# Patient Record
Sex: Female | Born: 1975 | Hispanic: No | Marital: Married | Smoking: Never smoker
Health system: Southern US, Community
[De-identification: ages and names within clinical notes are randomized; demographics above are authoritative.]

## PROBLEM LIST (undated history)

## (undated) DIAGNOSIS — F419 Anxiety disorder, unspecified: Secondary | ICD-10-CM

## (undated) DIAGNOSIS — K219 Gastro-esophageal reflux disease without esophagitis: Secondary | ICD-10-CM

## (undated) DIAGNOSIS — C50919 Malignant neoplasm of unspecified site of unspecified female breast: Secondary | ICD-10-CM

## (undated) DIAGNOSIS — C801 Malignant (primary) neoplasm, unspecified: Secondary | ICD-10-CM

## (undated) DIAGNOSIS — IMO0001 Reserved for inherently not codable concepts without codable children: Secondary | ICD-10-CM

## (undated) DIAGNOSIS — IMO0002 Reserved for concepts with insufficient information to code with codable children: Secondary | ICD-10-CM

## (undated) DIAGNOSIS — M62830 Muscle spasm of back: Secondary | ICD-10-CM

## (undated) HISTORY — DX: Reserved for inherently not codable concepts without codable children: IMO0001

## (undated) HISTORY — DX: Reserved for concepts with insufficient information to code with codable children: IMO0002

## (undated) HISTORY — PX: LEEP: SHX91

## (undated) HISTORY — PX: OTHER SURGICAL HISTORY: SHX169

## (undated) HISTORY — DX: Malignant neoplasm of unspecified site of unspecified female breast: C50.919

## (undated) HISTORY — PX: TONSILLECTOMY: SUR1361

## (undated) HISTORY — PX: WISDOM TOOTH EXTRACTION: SHX21

## (undated) HISTORY — PX: BILATERAL TOTAL MASTECTOMY WITH AXILLARY LYMPH NODE DISSECTION: SHX6364

## (undated) HISTORY — PX: BREAST SURGERY: SHX581

---

## 2013-03-28 ENCOUNTER — Other Ambulatory Visit (HOSPITAL_COMMUNITY): Payer: Self-pay | Admitting: *Deleted

## 2013-03-28 DIAGNOSIS — Z1231 Encounter for screening mammogram for malignant neoplasm of breast: Secondary | ICD-10-CM

## 2013-04-14 ENCOUNTER — Ambulatory Visit (HOSPITAL_COMMUNITY): Payer: 59

## 2013-04-15 ENCOUNTER — Ambulatory Visit (HOSPITAL_COMMUNITY): Payer: 59

## 2013-04-15 ENCOUNTER — Ambulatory Visit (HOSPITAL_COMMUNITY)
Admission: RE | Admit: 2013-04-15 | Discharge: 2013-04-15 | Disposition: A | Discharge status: Home / Self Care | Payer: 59 | Source: Ambulatory Visit | Attending: *Deleted | Admitting: *Deleted

## 2013-04-15 DIAGNOSIS — Z1231 Encounter for screening mammogram for malignant neoplasm of breast: Secondary | ICD-10-CM

## 2013-04-18 ENCOUNTER — Other Ambulatory Visit: Payer: Self-pay | Admitting: Obstetrics and Gynecology

## 2013-04-18 DIAGNOSIS — C50919 Malignant neoplasm of unspecified site of unspecified female breast: Secondary | ICD-10-CM

## 2013-04-29 ENCOUNTER — Ambulatory Visit
Admission: RE | Admit: 2013-04-29 | Discharge: 2013-04-29 | Disposition: A | Discharge status: Home / Self Care | Payer: 59 | Source: Ambulatory Visit | Attending: Obstetrics and Gynecology | Admitting: Obstetrics and Gynecology

## 2013-04-29 DIAGNOSIS — C50919 Malignant neoplasm of unspecified site of unspecified female breast: Secondary | ICD-10-CM

## 2013-04-29 MED ORDER — GADOBENATE DIMEGLUMINE 529 MG/ML IV SOLN
19.0000 mL | Freq: Once | INTRAVENOUS | Status: AC | PRN
  Administered 2013-04-29: 19 mL via INTRAVENOUS

## 2013-05-03 ENCOUNTER — Other Ambulatory Visit: Payer: Self-pay | Admitting: Obstetrics and Gynecology

## 2013-05-03 ENCOUNTER — Other Ambulatory Visit: Payer: Self-pay | Admitting: Internal Medicine

## 2013-05-03 DIAGNOSIS — R928 Other abnormal and inconclusive findings on diagnostic imaging of breast: Secondary | ICD-10-CM

## 2013-05-04 ENCOUNTER — Other Ambulatory Visit (HOSPITAL_COMMUNITY): Payer: Self-pay | Admitting: Obstetrics and Gynecology

## 2013-05-04 DIAGNOSIS — R2232 Localized swelling, mass and lump, left upper limb: Secondary | ICD-10-CM

## 2013-05-04 DIAGNOSIS — Z853 Personal history of malignant neoplasm of breast: Secondary | ICD-10-CM

## 2013-05-05 ENCOUNTER — Ambulatory Visit
Admission: RE | Admit: 2013-05-05 | Discharge: 2013-05-05 | Disposition: A | Discharge status: Home / Self Care | Payer: 59 | Source: Ambulatory Visit | Attending: Obstetrics and Gynecology | Admitting: Obstetrics and Gynecology

## 2013-05-05 DIAGNOSIS — R928 Other abnormal and inconclusive findings on diagnostic imaging of breast: Secondary | ICD-10-CM

## 2013-05-06 ENCOUNTER — Other Ambulatory Visit: Payer: 59

## 2013-05-10 ENCOUNTER — Telehealth: Payer: Self-pay | Admitting: *Deleted

## 2013-05-10 NOTE — Telephone Encounter (Signed)
Confirmed [facility] for 05/18/13 at 0800 .  Instructions and contact information given.

## 2013-05-11 ENCOUNTER — Other Ambulatory Visit: Payer: Self-pay | Admitting: *Deleted

## 2013-05-11 DIAGNOSIS — C771 Secondary and unspecified malignant neoplasm of intrathoracic lymph nodes: Secondary | ICD-10-CM | POA: Insufficient documentation

## 2013-05-11 DIAGNOSIS — C50912 Malignant neoplasm of unspecified site of left female breast: Secondary | ICD-10-CM | POA: Insufficient documentation

## 2013-05-11 DIAGNOSIS — C50919 Malignant neoplasm of unspecified site of unspecified female breast: Secondary | ICD-10-CM | POA: Insufficient documentation

## 2013-05-11 DIAGNOSIS — C773 Secondary and unspecified malignant neoplasm of axilla and upper limb lymph nodes: Principal | ICD-10-CM

## 2013-05-18 ENCOUNTER — Ambulatory Visit (HOSPITAL_BASED_OUTPATIENT_CLINIC_OR_DEPARTMENT_OTHER): Payer: 59 | Admitting: Oncology

## 2013-05-18 ENCOUNTER — Ambulatory Visit: Payer: 59 | Attending: Surgery | Admitting: Physical Therapy

## 2013-05-18 ENCOUNTER — Encounter: Payer: Self-pay | Admitting: *Deleted

## 2013-05-18 ENCOUNTER — Telehealth: Payer: Self-pay | Admitting: Oncology

## 2013-05-18 ENCOUNTER — Encounter: Payer: Self-pay | Admitting: Oncology

## 2013-05-18 ENCOUNTER — Other Ambulatory Visit (HOSPITAL_BASED_OUTPATIENT_CLINIC_OR_DEPARTMENT_OTHER): Payer: 59

## 2013-05-18 ENCOUNTER — Ambulatory Visit
Admission: RE | Admit: 2013-05-18 | Discharge: 2013-05-18 | Disposition: A | Discharge status: Home / Self Care | Payer: 59 | Source: Ambulatory Visit | Attending: Radiation Oncology | Admitting: Radiation Oncology

## 2013-05-18 ENCOUNTER — Ambulatory Visit: Payer: 59

## 2013-05-18 ENCOUNTER — Ambulatory Visit (HOSPITAL_BASED_OUTPATIENT_CLINIC_OR_DEPARTMENT_OTHER): Payer: 59 | Admitting: Surgery

## 2013-05-18 VITALS — BP 123/83 | HR 91 | Temp 98.3°F | Resp 20 | Ht 66.5 in | Wt 202.1 lb

## 2013-05-18 DIAGNOSIS — C773 Secondary and unspecified malignant neoplasm of axilla and upper limb lymph nodes: Secondary | ICD-10-CM

## 2013-05-18 DIAGNOSIS — Z170 Estrogen receptor positive status [ER+]: Secondary | ICD-10-CM

## 2013-05-18 DIAGNOSIS — C50919 Malignant neoplasm of unspecified site of unspecified female breast: Secondary | ICD-10-CM

## 2013-05-18 DIAGNOSIS — Z853 Personal history of malignant neoplasm of breast: Secondary | ICD-10-CM | POA: Insufficient documentation

## 2013-05-18 DIAGNOSIS — M259 Joint disorder, unspecified: Secondary | ICD-10-CM | POA: Insufficient documentation

## 2013-05-18 DIAGNOSIS — IMO0001 Reserved for inherently not codable concepts without codable children: Secondary | ICD-10-CM | POA: Insufficient documentation

## 2013-05-18 LAB — COMPREHENSIVE METABOLIC PANEL (CC13)
ALBUMIN: 3.6 g/dL (ref 3.5–5.0)
ALT: 11 U/L (ref 0–55)
AST: 14 U/L (ref 5–34)
Alkaline Phosphatase: 77 U/L (ref 40–150)
Anion Gap: 9 mEq/L (ref 3–11)
BUN: 9.7 mg/dL (ref 7.0–26.0)
CO2: 23 mEq/L (ref 22–29)
Calcium: 10 mg/dL (ref 8.4–10.4)
Chloride: 108 mEq/L (ref 98–109)
Creatinine: 0.8 mg/dL (ref 0.6–1.1)
Glucose: 103 mg/dl (ref 70–140)
POTASSIUM: 4 meq/L (ref 3.5–5.1)
Sodium: 140 mEq/L (ref 136–145)
TOTAL PROTEIN: 7.6 g/dL (ref 6.4–8.3)

## 2013-05-18 LAB — CBC WITH DIFFERENTIAL/PLATELET
BASO%: 0.7 % (ref 0.0–2.0)
Basophils Absolute: 0 10*3/uL (ref 0.0–0.1)
EOS%: 6 % (ref 0.0–7.0)
Eosinophils Absolute: 0.4 10*3/uL (ref 0.0–0.5)
HCT: 36.9 % (ref 34.8–46.6)
HGB: 11.9 g/dL (ref 11.6–15.9)
LYMPH%: 43.6 % (ref 14.0–49.7)
MCH: 27.6 pg (ref 25.1–34.0)
MCHC: 32.2 g/dL (ref 31.5–36.0)
MCV: 85.6 fL (ref 79.5–101.0)
MONO#: 0.5 10*3/uL (ref 0.1–0.9)
MONO%: 7.9 % (ref 0.0–14.0)
NEUT#: 2.5 10*3/uL (ref 1.5–6.5)
NEUT%: 41.8 % (ref 38.4–76.8)
Platelets: 290 10*3/uL (ref 145–400)
RBC: 4.31 10*6/uL (ref 3.70–5.45)
RDW: 14.3 % (ref 11.2–14.5)
WBC: 6 10*3/uL (ref 3.9–10.3)
lymph#: 2.6 10*3/uL (ref 0.9–3.3)

## 2013-05-18 NOTE — Progress Notes (Signed)
Pt signed a ROI for the [facility].  She also signed a ROI for her previous Psychologist, sport and exercise, oncologist and plastics and I faxed it to each facility.  Pt signed the [facility] form and I faxed it to [place] at [employer] and filed it.

## 2013-05-18 NOTE — Progress Notes (Addendum)
Re:   [name] DOB:   1976/01/12 MRN:   [id number]  [place]  ASSESSMENT AND PLAN: 1.  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  Oncologist - Wentworth/Magrinat  Plan: 1) PET scan, 2) if PET negative, do completion left axillary dissection, if PET postive, leave axillary nodes, 3) Power port (she did not have one the first time for treatment), 4) refer for gentics  [MRI - 1/302015 - 1. Examination is positive for hypermetabolic left axillary and left supraclavicular lymph node metastasis. 2. No evidence for distant metastatic disease.   Discussed with patient, Drs. Magrinat and Wentwoth - plan: first surgery - completion left axillary dissection and power port placement, secondly chemotherapy, and thirdly radiation therapy.  I discussed risks of surgery including bleeding, infection, nerve injury, lymphedema, and pneumothorax. DN 05/31/2013]  2.  History of bilateral mastectomy for breast cancer  No chief complaint on file.  REFERRING PHYSICIAN: POWELL,ELMIRA, PA-C  HISTORY OF PRESENT ILLNESS: [name] is a 38 y.o. (DOB: 09/06/1975)  AA  female whose primary care physician is POWELL,ELMIRA, PA-C and comes to [facility]. Comes with boyfriend, Susa Raring.  [name] underwent bilateral mastectomy, chemothx with CMF and 15 months of tamoxifen for a left breast cancer identified in October of 2011 in [place].  But she did not tolerate the tamoxifen and stopped it herself.  She was treated by [facility] in [facility], Dr. Annette Stable.  She had a bilateral mastectomy 01/28/2010.   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 2/6 left axillary nodes showed isolated tumor cells, but the others were negative.  The cancer was ER/PR positive and Her2Neu negative.  She was  treated with CMF starting 02/22/2010 - 07/05/2010.  Tissue expander 'replaced" by Dr. Westley Chandler on 09/11/2010 after right side got infected Oncotype DX score of 22 with recurrence rate of 14%.  Recent evaluation showed a left axillary mass.  She underwent a core biopsy of the left axilla on 05/05/2013 (Accession#: [id number]) which showed metastatic mammary carcinoma.  MRI - 05/02/2013 - Left axillary lymphadenopathy. Second-look ultrasound of the left  axilla is recommended for further evaluation and possible biopsy targeting if needed.  No evidence for malignancy in the right mastectomy bed.  She has no children.  Her LMP was 05/02/2013.  She is on no hormone therapy. She underwent BRAC testing which she says was negative.   No past medical history on file.   No past surgical history on file.    No current outpatient prescriptions on file.   No current facility-administered medications for this visit.      Allergies  Allergen Reactions  . Gadolinium Derivatives Hives    After gado injection, pt had a few small hives on left breast area. Treated with benadryl by dr Janeece Fitting     REVIEW OF SYSTEMS: Skin:  No history of rash.  No history of abnormal moles. Infection:  She did have an infection post mastectomy/tissue expander which necessitated removing the tissue expander.. Neurologic:  No history of stroke.  No history of seizure.  No history of headaches. Cardiac:  No history of hypertension. No history of heart disease.   No history of seeing a cardiologist. Pulmonary:  Does not smoke cigarettes.  No asthma or bronchitis.  No OSA/CPAP.  Endocrine:  No diabetes. No thyroid  disease. Gastrointestinal:  No history of stomach disease.  No history of liver disease.  No history of gall bladder disease.  No history of pancreas disease.  No history of colon disease. Urologic:  No history of kidney stones.  No history of bladder infections. Musculoskeletal:  No history of joint or back  disease. Hematologic:  No bleeding disorder.  No history of anemia.  Not anticoagulated. Psycho-social:  The patient is oriented.     SOCIAL and FAMILY HISTORY: Unmarried. Boyfriend, Susa Raring, with patient. She moved to [facility] in June 2013 to be near boyfriend. No children. Works for [employer], Corporate treasurer  PHYSICAL EXAM: There were no vitals taken for this visit.  General: AA F who is alert and generally healthy appearing.  HEENT: Normal. Pupils equal. Neck: Supple. No mass.  No thyroid mass. Lymph Nodes:  No supraclavicular or cervical nodes.  Left axillary scar, but I do not feel an obvious mass. Lungs: Clear to auscultation and symmetric breath sounds. Heart:  RRR. No murmur or rub. Breast:  Right - reconstructed, no mass  Left - reconstructed, no mass  Abdomen: Soft. No mass. No tenderness. No hernia. Normal bowel sounds.  No abdominal scars. Rectal: Not done. Extremities:  Good strength and ROM  in upper and lower extremities. Neurologic:  Grossly intact to motor and sensory function. Psychiatric: Has normal mood and affect. Behavior is normal.   DATA REVIEWED: Epic notes  Alphonsa Overall, MD,  [facility] [address]  [place], [place]    [place] Phone:  [place]:  [phone]

## 2013-05-18 NOTE — Progress Notes (Signed)
[name]  [phone] [phone]) [phone]     ID: [name] OB: 07-04-1975  MR#: [id number]  JYN#:[id number]  PCP: Colvin Caroli GYN:   SU: Alphonsa Overall OTHER MD: Lonia Chimera [phone])  CHIEF COMPLAINT: " I have breast cancer"  HISTORY OF PRESENT ILLNESS: [name] had bilateral diagnostic mammography [facility] in [place] 11/30/2009 showing a 1.5 cm mass at the 9:00 position of the left breast with some satellites. A biopsy of the mass in question [place] [facility] showed ([id number]) and invasive lobular carcinoma, which was estrogen and progesterone receptor both 100% positive, both with strong staining intensity, but HER-2 negative at 1+. Bilateral breast MRI 12/13/2009 in [place] showed in the left breast an irregularly marginated mass measuring 3.2 cm. There were 4 or 5 separate satellite lesions laterally and superior to the primary.  Accordingly, after appropriate discussion, the patient underwent bilateral mastectomies with left sentinel lymph node sampling 01/02/2010. The right breast was benign. The left breast showed a 2.3 cm invasive lobular carcinoma, grade 3, with a total of 6 sentinel lymph nodes removed, all negative, although 2 showed isolated tumor cells by immunostaining. Margins were negative.  An Oncotype BX was sent, with a recurrence score of 22, predicting a risk of distant recurrence within 10 years with 14% of the patients only systemic treatment was tamoxifen. The patient was treated with CMF chemotherapy between 02/22/2010 and 07/05/2010, receiving 7 cycles at which point the patient refused further chemotherapy though there have been no major toxicities at according to the oncology note from Dr. Brent General. The patient was then started on tamoxifen 08/09/2010. By her cancer took approximately 12-15 months then stopped because of hot flashes and insomnia problems.  More  recently, the patient noted a change in her left axilla andunderwent bilateral breast MRI January to 2015 at [place]. The patient has bilateral submuscular silicone implants in place. The breast were unremarkable, but there were several lymph nodes with cortical thickening in the left axilla including a dominant 1 measuring 1.2 cm in the short axis. Ultrasonography of this area identified the lymph node in question and the patient underwent biopsy of the left axillary lymph node 05/05/2013. This showed ([id number]) near-total replacement of the lymph node by metastatic carcinoma. This was 96% estrogen receptor positive, and 84% progesterone receptor positive, both with strong staining intensity. The MIB-1 was 20%. HER-2 determination is pending.  The patient's subsequent history is as detailed below  INTERVAL HISTORY: [name] was seen at the multidisciplinary breast cancer clinic 05/18/2013 accompanied by her significant other [name].  REVIEW OF SYSTEMS: The patient denies unusual headaches, visual changes, nausea, vomiting, stiff neck, dizziness, or gait imbalance. There has been no cough, phlegm production, or pleurisy, no chest pain or pressure, and no change in bowel or bladder habits. The patient denies fever, rash, bleeding, unexplained fatigue or unexplained weight loss. A detailed review of systems was otherwise entirely negative.                 PAST MEDICAL HISTORY: No past medical history on file.  PAST SURGICAL HISTORY: Past Surgical History  Procedure Laterality Date  . Bilateral total mastectomy with axillary lymph node dissection    . Tonsillectomy    . Leep      FAMILY HISTORY Family History  Problem Relation Age of Onset  . Other Mother     glioblastoma  . Other Father     glioblastoma  according to the patient both her parents died from a primary brain cancers, namely we have the stomas, her father at 42, her mother at 25. The patient had one  brother and one sister. There is no history of breast or ovarian cancer in the family  GYNECOLOGIC HISTORY:  Menarche age 71. The patient was never carried a child to term. She is still having regular periods, most recently second week in January. The patient took oral contraceptives between the ages of 59 and 50 with no complications  SOCIAL HISTORY:  [name] works as a Engineer, civil (consulting) for [employer]. She shares a home with her significant other, [name], who works at [employer]. They have no pets.    ADVANCED DIRECTIVES: Not in place   HEALTH MAINTENANCE: History  Substance Use Topics  . Smoking status: Never Smoker   . Smokeless tobacco: Not on file  . Alcohol Use: Yes     Comment: once a week     Colonoscopy:  PAP: December 2014  Bone density:  Lipid panel:  Allergies  Allergen Reactions  . Gadolinium Derivatives Hives    After gado injection, pt had a few small hives on left breast area. Treated with benadryl by dr Janeece Fitting     No current outpatient prescriptions on file.   No current facility-administered medications for this visit.    OBJECTIVE: Young African American woman in no acute distress Filed Vitals:   05/18/13 0829  BP: 123/83  Pulse: 91  Temp: 98.3 F (36.8 C)  Resp: 20     Body mass index is 32.13 kg/(m^2).    ECOG FS:0 - Asymptomatic  Ocular: Sclerae unicteric, pupils equal, round and reactive to light Ear-nose-throat: Oropharynx clear, no thrush or other lesions Lymphatic: No cervical or supraclavicular adenopathy Lungs no rales or rhonchi, good excursion bilaterally Heart regular rate and rhythm, no murmur appreciated Abd soft, nontender, positive bowel sounds MSK no focal spinal tenderness, no joint edema Neuro: non-focal, well-oriented, appropriate affect Breasts: The patient is status post bilateral mastectomies with bilateral submuscular implant. There is no evidence of local recurrence. The right axilla is  benign. I do not feel a well-defined mass in the left axilla at this time.   LAB RESULTS:  CMP     Component Value Date/Time   NA 140 05/18/2013 0811   K 4.0 05/18/2013 0811   CO2 23 05/18/2013 0811   GLUCOSE 103 05/18/2013 0811   BUN 9.7 05/18/2013 0811   CREATININE 0.8 05/18/2013 0811   CALCIUM 10.0 05/18/2013 0811   PROT 7.6 05/18/2013 0811   ALBUMIN 3.6 05/18/2013 0811   AST 14 05/18/2013 0811   ALT 11 05/18/2013 0811   ALKPHOS 77 05/18/2013 0811   BILITOT <0.20 05/18/2013 0811    I No results found for this basename: SPEP, UPEP,  kappa and lambda light chains    Lab Results  Component Value Date   WBC 6.0 05/18/2013   NEUTROABS 2.5 05/18/2013   HGB 11.9 05/18/2013   HCT 36.9 05/18/2013   MCV 85.6 05/18/2013   PLT 290 05/18/2013      Chemistry      Component Value Date/Time   NA 140 05/18/2013 0811   K 4.0 05/18/2013 0811   CO2 23 05/18/2013 0811   BUN 9.7 05/18/2013 0811   CREATININE 0.8 05/18/2013 0811      Component Value Date/Time   CALCIUM 10.0 05/18/2013 0811   ALKPHOS 77 05/18/2013 0811   AST 14 05/18/2013 0811  ALT 11 05/18/2013 0811   BILITOT <0.20 05/18/2013 0811       No results found for this basename: LABCA2    No components found with this basename: [id number]    No results found for this basename: INR,  in the last 168 hours  Urinalysis No results found for this basename: colorurine, appearanceur, labspec, phurine, glucoseu, hgbur, bilirubinur, ketonesur, proteinur, urobilinogen, nitrite, leukocytesur    STUDIES: Mr Breast Bilateral W Wo Contrast  05/02/2013   CLINICAL DATA:  History of bilateral mastectomy for breast cancer 5 years ago at age 51. All prior imaging outside this state, not available for comparison. The provided clinical history does not specify whether the initial diagnosis of breast cancer was in the right or left breast.  EXAM: BILATERAL BREAST MRI WITH AND WITHOUT CONTRAST  LABS:  Not obtained  TECHNIQUE: Multiplanar, multisequence MR  images of both breasts were obtained prior to and following the intravenous administration of 40m of MultiHance  THREE-DIMENSIONAL MR IMAGE RENDERING ON INDEPENDENT WORKSTATION:  Three-dimensional MR images were rendered by post-processing of the original MR data on an independent workstation. The three-dimensional MR images were interpreted, and findings are reported in the following complete MRI report for this study. Three dimensional images were evaluated at the independent DynaCad workstation  COMPARISON:  No prior imaging.  FINDINGS: Breast composition: Not applicable, status post bilateral mastectomies with bilateral submuscular silicone implants in place.  Background parenchymal enhancement: None  Right breast: No mass or abnormal enhancement.  Left breast: No mass or abnormal enhancement.  Lymph nodes: There are several lymph nodes with cortical thickening in the left axilla, including a dominant 1.2 cm short axis lymph node on image 8 of the T2 weighted series.  Ancillary findings:  None.  IMPRESSION: Left axillary lymphadenopathy. Second-look ultrasound of the left axilla is recommended for further evaluation and possible biopsy targeting if needed.  No evidence for malignancy in the right mastectomy bed.  RECOMMENDATION: Left axilla ultrasound and possible ultrasound-guided core biopsy  BI-RADS CATEGORY  4: Suspicious abnormality - biopsy should be considered.   Electronically Signed   By: GConchita ParisM.D.   On: 05/02/2013 10:14   UKoreaLt Breast Bx W Loc Dev 1st Lesion Img Bx Spec UKoreaGuide  05/09/2013   ADDENDUM REPORT: 05/09/2013 14:27  ADDENDUM: Pathology results of a left axillary lymph node biopsy, performed under ultrasound guidance, show a lymph node replaced with metastatic carcinoma. The patient has a history of known breast cancer, diagnosed at age 38 and has undergone bilateral mastectomies.  Pathology results are concordant with imaging findings. The patient reports doing well after the  biopsy.  I telephoned pathology results to the patient today, 05/09/2013. [facility]   Electronically Signed   By: SCurlene DolphinM.D.   On: 05/09/2013 14:27   05/09/2013   CLINICAL DATA:  38year old female with prior history of breast cancer and left axillary lymphadenopathy seen on recent breast MRI.  EXAM: ULTRASOUND GUIDED LEFT BREAST CORE NEEDLE BIOPSY WITH VACUUM ASSIST  COMPARISON:  Previous exams.  PROCEDURE: I met with the patient and we discussed the procedure of ultrasound-guided biopsy, including benefits and alternatives. We discussed the high likelihood of a successful procedure. We discussed the risks of the procedure including infection, bleeding, tissue injury, clip migration, and inadequate sampling. The risk of implant rupture was also discussed with the patient. Informed written consent was given. The usual time-out protocol  was performed immediately prior to the procedure.  Using sterile technique and 2% Lidocaine as local anesthetic, under direct ultrasound visualization, a 12 gauge vacuum-assisteddevice was used to perform biopsy of the lymph node with a thickened cortex in the left axilla using a lateral approach.  IMPRESSION: Ultrasound-guided biopsy of the morphologically abnormal lymph node in the left axilla. No apparent complications.  Electronically Signed: By: Everlean Alstrom M.D. On: 05/05/2013 10:29    ASSESSMENT: 38 y.o. [place] woman  (1) status post bilateral mastectomies with left axillary lymph node sampling 01/02/2010 for a pT2 pN0(i+), stage IIA invasive lobular breast cancer, grade 3, estrogen and progesterone receptor positive, HER-2 not amplified; right breast was benign  (2) Oncotype DX score of 22 predicted a 14% risk of distant recurrence within 10 years if the patient's only systemic treatment is tamoxifen for 5 years  (3) status post CMF(cyclophosphamide, fluorouracil,  methotrexate) x7 given between October of 2011 and March of 2012 (7 of 8 planned treatments completed)  (4) tamoxifen started April 2012, discontinued approximately July of 2013 (about 15 months) because of problems with hot flashes and insomnia  (5) pathologically documented left axillary recurrence 05/05/2013, the tumor being again estrogen and progesterone receptor positive, with HER-2 in process, and MIB-1 of 15%    PLAN: We spent the better part of today's hour-long appointment discussing the biology of breast cancer in general, and the specifics of the patient's tumor in particular. Darrion understands the first thing to do in her case is to do staging studies, namely a PET scan with chest CT, because if she proved to have stage IV disease it would make a considerable difference both to her local and systemic therapy. I am going to try to get this study scheduled as soon as possible.  Assuming the staging studies are negative, then she will undergo left axillary lymph node dissection and port placement, to be followed by chemotherapy (possibly 4 cycles of carboplatin/ docetaxel, another option being 4 cycles of cyclophosphamide and doxorubicin followed by paclitaxel). The choice of chemotherapy will depend to some extent in the number of positive lymph nodes. After that the patient will receive radiation at the discretion of Dr. Pablo Ledger. When local treatment has been completed she will start antiestrogen, likely aromatase inhibitors under cover of Zoladex.  All this was discussed with Levada Dy. She has a good understanding of the overall situation and agrees with the preliminary treatment plan. She knows the goal of treatment, unless we are dealing with stage IV disease, is cure. She will call with any problems that may develop before her next visit here.   Chauncey Cruel, MD   05/18/2013 9:37 AM

## 2013-05-18 NOTE — Progress Notes (Signed)
Checked in new pt with no financial concerns. °

## 2013-05-18 NOTE — Telephone Encounter (Signed)
, °

## 2013-05-18 NOTE — Telephone Encounter (Signed)
Called Ms. [name] and notified her I will alert Dr. Jana Hakim of her message to correct her age and time elapsed with her diagnosis.  She would like to have accurate information about her history.  The requisition for the MRI is from her OB/GYN who is no longer using the EPIC program.  She has no further questions or concerns.

## 2013-05-18 NOTE — Progress Notes (Signed)
Radiation Oncology         [phone] ________________________________  Initial outpatient Consultation - Date: 05/18/2013   Name: [name] MRN: [id number]   DOB: 03-Dec-1975  REFERRING PHYSICIAN: Shann Medal, MD  DIAGNOSIS: Recurrent Left axillary adenopathy  HISTORY OF PRESENT ILLNESS::[name] is a 38 y.o. female  underwent bilateral mastectomies after palpating a right breast abnormality. She was actually found to have a left breast mass measuring about 3 cm. She had bilateral mastectomies reconstruction September 2011. Her tumor measured 2.3 cm. 3 sentinel lymph nodes contained isolated tumor cells. The 3 other nodes were negative. Right mastectomy showed no evidence of disease. Her tumor was ER/PR positive HER-2 negative. She completed chemotherapy with [facility] in March of 2012. She was on tamoxifen following that. He palpated a left breast mass in December of last year. She was worked up and found to have left axillary adenopathy. 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 MRI of the bilateral breasts on 05/02/2013 showed several abnormal left axillary lymph nodes including a 1.2 cm lymph node. No abnormalities were seen other than bilateral subpectoral implants. He is GX P0. She is menstruating. She is on oral contraceptives. She is accompanied by her boyfriend.  PREVIOUS RADIATION THERAPY: No  PAST MEDICAL HISTORY:  has no past medical history on file.    PAST SURGICAL HISTORY: Past Surgical History  Procedure Laterality Date  . Bilateral total mastectomy with axillary lymph node dissection    . Tonsillectomy    . Leep      FAMILY HISTORY:  Family History  Problem Relation Age of Onset  . Other Mother     glioblastoma  . Other Father     glioblastoma    SOCIAL HISTORY:  History  Substance Use Topics  . Smoking status: Never Smoker   .  Smokeless tobacco: Not on file  . Alcohol Use: Yes     Comment: once a week    ALLERGIES: Sulfa antibiotics; Gadolinium derivatives; and Petrolatum  MEDICATIONS:  No current outpatient prescriptions on file.   No current facility-administered medications for this encounter.    REVIEW OF SYSTEMS:  A 15 point review of systems is documented in the electronic medical record. This was obtained by the nursing staff. However, I reviewed this with the patient to discuss relevant findings and make appropriate changes.  Pertinent items are noted in HPI.  PHYSICAL EXAM: There were no vitals filed for this visit.. . She is a pleasant female in no distress sitting comfortably examining table. She has bilateral implants in place. She has had nipple area lower reconstruction. She has no palpable abnormalities of the right implant or of the right axilla. In the left there is no palpable or visible signs of tumor recurrence. There is palpable lymphadenopathy in the left axilla. It is not fixed. She is alert minus x3. She is 5 out of 5 strength bilaterally.  LABORATORY DATA:  Lab Results  Component Value Date   WBC 6.0 05/18/2013   HGB 11.9 05/18/2013   HCT 36.9 05/18/2013   MCV 85.6 05/18/2013   PLT 290 05/18/2013   Lab Results  Component Value Date   NA 140 05/18/2013   K 4.0 05/18/2013   CO2 23 05/18/2013   Lab Results  Component Value Date   ALT 11 05/18/2013   AST 14 05/18/2013   ALKPHOS 77 05/18/2013  BILITOT <0.20 05/18/2013     RADIOGRAPHY: Mr Breast Bilateral W Wo Contrast  05/02/2013   CLINICAL DATA:  History of bilateral mastectomy for breast cancer 5 years ago at age 66. All prior imaging outside this state, not available for comparison. The provided clinical history does not specify whether the initial diagnosis of breast cancer was in the right or left breast.  EXAM: BILATERAL BREAST MRI WITH AND WITHOUT CONTRAST  LABS:  Not obtained  TECHNIQUE: Multiplanar, multisequence MR images of both  breasts were obtained prior to and following the intravenous administration of 50m of MultiHance  THREE-DIMENSIONAL MR IMAGE RENDERING ON INDEPENDENT WORKSTATION:  Three-dimensional MR images were rendered by post-processing of the original MR data on an independent workstation. The three-dimensional MR images were interpreted, and findings are reported in the following complete MRI report for this study. Three dimensional images were evaluated at the independent DynaCad workstation  COMPARISON:  No prior imaging.  FINDINGS: Breast composition: Not applicable, status post bilateral mastectomies with bilateral submuscular silicone implants in place.  Background parenchymal enhancement: None  Right breast: No mass or abnormal enhancement.  Left breast: No mass or abnormal enhancement.  Lymph nodes: There are several lymph nodes with cortical thickening in the left axilla, including a dominant 1.2 cm short axis lymph node on image 8 of the T2 weighted series.  Ancillary findings:  None.  IMPRESSION: Left axillary lymphadenopathy. Second-look ultrasound of the left axilla is recommended for further evaluation and possible biopsy targeting if needed.  No evidence for malignancy in the right mastectomy bed.  RECOMMENDATION: Left axilla ultrasound and possible ultrasound-guided core biopsy  BI-RADS CATEGORY  4: Suspicious abnormality - biopsy should be considered.   Electronically Signed   By: GConchita ParisM.D.   On: 05/02/2013 10:14   UKoreaLt Breast Bx W Loc Dev 1st Lesion Img Bx Spec UK[place]Guide  05/09/2013   ADDENDUM REPORT: 05/09/2013 14:27  ADDENDUM: Pathology results of a left axillary lymph node biopsy, performed under ultrasound guidance, show a lymph node replaced with metastatic carcinoma. The patient has a history of known breast cancer, diagnosed at age 38 and has undergone bilateral mastectomies.  Pathology results are concordant with imaging findings. The patient reports doing well after the biopsy.  I  telephoned pathology results to the patient today, 05/09/2013. [facility].   Electronically Signed   By: SCurlene DolphinM.D.   On: 05/09/2013 14:27   05/09/2013   CLINICAL DATA:  38year old female with prior history of breast cancer and left axillary lymphadenopathy seen on recent breast MRI.  EXAM: ULTRASOUND GUIDED LEFT BREAST CORE NEEDLE BIOPSY WITH VACUUM ASSIST  COMPARISON:  Previous exams.  PROCEDURE: I met with the patient and we discussed the procedure of ultrasound-guided biopsy, including benefits and alternatives. We discussed the high likelihood of a successful procedure. We discussed the risks of the procedure including infection, bleeding, tissue injury, clip migration, and inadequate sampling. The risk of implant rupture was also discussed with the patient. Informed written consent was given. The usual time-out protocol was performed immediately prior to the procedure.  Using sterile technique and 2% Lidocaine as local anesthetic, under direct ultrasound visualization, a 12 gauge vacuum-assisteddevice was used to perform biopsy of the lymph node with a thickened cortex in the left axilla using a lateral approach.  IMPRESSION: Ultrasound-guided biopsy of the morphologically abnormal lymph node in the left axilla. No apparent complications.  Electronically  Signed: By: Everlean Alstrom M.D. On: 05/05/2013 10:29      IMPRESSION: Left axillary node recurrence in a patient with a history of T2 N0 left breast cancer with isolated tumor cells.  PLAN: We discussed the role of radiation in patients with lymph node recurrence. That lymph node recurrence does predict for chest wall recurrence. If she has not had a metastatic disease on her PET scan I would recommend radiation to the left chest wall and supraclavicular fossa. Chemotherapy is considered as well. Right now we will wait for the results of her PET scan. If that is negative  she will proceed on axilla lymph node dissection and a Port-A-Cath placement. This will be followed by radiation with possible chemotherapy and of course antiestrogen therapy. We discussed the process of simulation the placement tattoos. We discussed skin redness and darkening as possible side effects. We discussed decreased cosmetic outcome is with her implants and permanent skin darkness. We discussed fatigue. I will plan on meeting back with her after her surgery and chemotherapy. She will meet with a member of our patient family support staff as well as medical oncology surgery and physical therapy.  I spent 40 minutes  face to face with the patient and more than 50% of that time was spent in counseling and/or coordination of care.   ------------------------------------------------  Thea Silversmith, MD

## 2013-05-23 ENCOUNTER — Encounter: Payer: Self-pay | Admitting: *Deleted

## 2013-05-23 ENCOUNTER — Telehealth: Payer: Self-pay | Admitting: *Deleted

## 2013-05-23 NOTE — Progress Notes (Signed)
[place] Psychosocial Distress Screening Clinical Social Work  Patient completed distress screening protocol, and scored a 5 on the Psychosocial Distress Thermometer which indicates moderate distress. Clinical Social Worker met with pt in [facility] on 05/18/13 to assess for distress and other psychosocial needs.  Pt had bilateral mastectomies in 2011, and stated that adjusting to her new diagnosis was causing her the most distress, but felt confident in her treatment team. CSW informed pt of the support services and support team at [facility], and pt was agreeable to an [place] referral.  CSW encouraged pt to call with any additional questions or concerns.  Johnnye Lana, MSW, [place] Worker [facility] [phone]

## 2013-05-23 NOTE — Telephone Encounter (Signed)
Faxed Care Plan to Leigh at [facility] & to the PCP.  Took Care Plan to Med Rec to scan.  

## 2013-05-27 ENCOUNTER — Encounter (HOSPITAL_COMMUNITY)
Admission: RE | Admit: 2013-05-27 | Discharge: 2013-05-27 | Disposition: A | Discharge status: Home / Self Care | Payer: 59 | Source: Ambulatory Visit | Attending: Oncology | Admitting: Oncology

## 2013-05-27 ENCOUNTER — Encounter (HOSPITAL_COMMUNITY): Payer: Self-pay

## 2013-05-27 ENCOUNTER — Ambulatory Visit (HOSPITAL_COMMUNITY)
Admission: RE | Admit: 2013-05-27 | Discharge: 2013-05-27 | Disposition: A | Discharge status: Home / Self Care | Payer: 59 | Source: Ambulatory Visit | Attending: Oncology | Admitting: Oncology

## 2013-05-27 DIAGNOSIS — C50919 Malignant neoplasm of unspecified site of unspecified female breast: Secondary | ICD-10-CM | POA: Insufficient documentation

## 2013-05-27 DIAGNOSIS — Z978 Presence of other specified devices: Secondary | ICD-10-CM | POA: Insufficient documentation

## 2013-05-27 DIAGNOSIS — I517 Cardiomegaly: Secondary | ICD-10-CM | POA: Insufficient documentation

## 2013-05-27 DIAGNOSIS — R599 Enlarged lymph nodes, unspecified: Secondary | ICD-10-CM | POA: Insufficient documentation

## 2013-05-27 DIAGNOSIS — C773 Secondary and unspecified malignant neoplasm of axilla and upper limb lymph nodes: Secondary | ICD-10-CM

## 2013-05-27 DIAGNOSIS — C778 Secondary and unspecified malignant neoplasm of lymph nodes of multiple regions: Secondary | ICD-10-CM | POA: Insufficient documentation

## 2013-05-27 DIAGNOSIS — Z9010 Acquired absence of unspecified breast and nipple: Secondary | ICD-10-CM | POA: Insufficient documentation

## 2013-05-27 DIAGNOSIS — Z9221 Personal history of antineoplastic chemotherapy: Secondary | ICD-10-CM | POA: Insufficient documentation

## 2013-05-27 HISTORY — DX: Malignant (primary) neoplasm, unspecified: C80.1

## 2013-05-27 LAB — GLUCOSE, CAPILLARY: GLUCOSE-CAPILLARY: 86 mg/dL (ref 70–99)

## 2013-05-27 MED ORDER — FLUDEOXYGLUCOSE F - 18 (FDG) INJECTION
15.3000 | Freq: Once | INTRAVENOUS | Status: AC | PRN
  Administered 2013-05-27: 15.3 via INTRAVENOUS

## 2013-05-27 MED ORDER — IOHEXOL 300 MG/ML  SOLN
80.0000 mL | Freq: Once | INTRAMUSCULAR | Status: AC | PRN
  Administered 2013-05-27: 80 mL via INTRAVENOUS

## 2013-05-31 NOTE — Addendum Note (Signed)
Addended by: Shann Medal on: 05/31/2013 02:50 PM   Modules accepted: Orders

## 2013-06-01 ENCOUNTER — Encounter (INDEPENDENT_AMBULATORY_CARE_PROVIDER_SITE_OTHER): Payer: Self-pay

## 2013-06-02 ENCOUNTER — Telehealth: Payer: Self-pay | Admitting: *Deleted

## 2013-06-02 NOTE — Telephone Encounter (Signed)
Called and spoke with patient from [facility] 05/18/13.  Rescheduled her appointment with Dr. Jana Hakim due to her surgery date.  Confirmed new appointment for 06/24/13 at [facility] with Dr. Jana Hakim. No other questions or concerns at this time.  Encouraged patient to call with any needs.

## 2013-06-07 ENCOUNTER — Encounter (HOSPITAL_COMMUNITY): Payer: Self-pay | Admitting: *Deleted

## 2013-06-07 ENCOUNTER — Ambulatory Visit: Payer: 59 | Admitting: Oncology

## 2013-06-08 ENCOUNTER — Other Ambulatory Visit: Payer: Self-pay | Admitting: Obstetrics and Gynecology

## 2013-06-10 ENCOUNTER — Encounter (HOSPITAL_COMMUNITY): Payer: Self-pay | Admitting: Pharmacy Technician

## 2013-06-16 ENCOUNTER — Ambulatory Visit (HOSPITAL_COMMUNITY)
Admission: RE | Admit: 2013-06-16 | Discharge: 2013-06-16 | Disposition: A | Discharge status: Home / Self Care | Payer: 59 | Source: Ambulatory Visit | Attending: Surgery | Admitting: Surgery

## 2013-06-16 ENCOUNTER — Encounter (HOSPITAL_COMMUNITY): Payer: Self-pay | Admitting: *Deleted

## 2013-06-16 ENCOUNTER — Encounter (HOSPITAL_COMMUNITY)
Admission: RE | Disposition: A | Discharge status: Home / Self Care | Payer: Self-pay | Source: Ambulatory Visit | Attending: Surgery

## 2013-06-16 ENCOUNTER — Ambulatory Visit (HOSPITAL_COMMUNITY): Payer: 59 | Admitting: Certified Registered Nurse Anesthetist

## 2013-06-16 ENCOUNTER — Encounter (HOSPITAL_COMMUNITY): Payer: 59 | Admitting: Certified Registered Nurse Anesthetist

## 2013-06-16 ENCOUNTER — Ambulatory Visit (HOSPITAL_COMMUNITY): Payer: 59

## 2013-06-16 ENCOUNTER — Telehealth: Payer: Self-pay | Admitting: Oncology

## 2013-06-16 DIAGNOSIS — C773 Secondary and unspecified malignant neoplasm of axilla and upper limb lymph nodes: Secondary | ICD-10-CM | POA: Insufficient documentation

## 2013-06-16 DIAGNOSIS — C50919 Malignant neoplasm of unspecified site of unspecified female breast: Secondary | ICD-10-CM

## 2013-06-16 DIAGNOSIS — Z9010 Acquired absence of unspecified breast and nipple: Secondary | ICD-10-CM | POA: Insufficient documentation

## 2013-06-16 DIAGNOSIS — Z170 Estrogen receptor positive status [ER+]: Secondary | ICD-10-CM | POA: Insufficient documentation

## 2013-06-16 HISTORY — PX: PORTACATH PLACEMENT: SHX2246

## 2013-06-16 HISTORY — PX: AXILLARY LYMPH NODE DISSECTION: SHX5229

## 2013-06-16 SURGERY — INSERTION, TUNNELED CENTRAL VENOUS DEVICE, WITH PORT
Anesthesia: General

## 2013-06-16 MED ORDER — PROMETHAZINE HCL 25 MG/ML IJ SOLN
6.2500 mg | INTRAMUSCULAR | Status: DC | PRN
  Administered 2013-06-16: 6.25 mg via INTRAVENOUS

## 2013-06-16 MED ORDER — LACTATED RINGERS IV SOLN
INTRAVENOUS | Status: DC
  Administered 2013-06-16: 11:00:00 via INTRAVENOUS

## 2013-06-16 MED ORDER — BUPIVACAINE-EPINEPHRINE 0.25% -1:200000 IJ SOLN
INTRAMUSCULAR | Status: AC
  Filled 2013-06-16: qty 1

## 2013-06-16 MED ORDER — HEPARIN SOD (PORK) LOCK FLUSH 100 UNIT/ML IV SOLN
INTRAVENOUS | Status: DC | PRN
  Administered 2013-06-16: 500 [IU] via INTRAVENOUS

## 2013-06-16 MED ORDER — LIDOCAINE HCL (CARDIAC) 20 MG/ML IV SOLN
INTRAVENOUS | Status: DC | PRN
  Administered 2013-06-16: 100 mg via INTRAVENOUS

## 2013-06-16 MED ORDER — BUPIVACAINE-EPINEPHRINE 0.25% -1:200000 IJ SOLN
INTRAMUSCULAR | Status: DC | PRN
  Administered 2013-06-16: 30 mL

## 2013-06-16 MED ORDER — CEFAZOLIN SODIUM-DEXTROSE 2-3 GM-% IV SOLR
INTRAVENOUS | Status: AC
  Filled 2013-06-16: qty 50

## 2013-06-16 MED ORDER — KETOROLAC TROMETHAMINE 30 MG/ML IJ SOLN
INTRAMUSCULAR | Status: AC
  Filled 2013-06-16: qty 1

## 2013-06-16 MED ORDER — DEXAMETHASONE SODIUM PHOSPHATE 10 MG/ML IJ SOLN
INTRAMUSCULAR | Status: AC
  Filled 2013-06-16: qty 1

## 2013-06-16 MED ORDER — FENTANYL CITRATE 0.05 MG/ML IJ SOLN
INTRAMUSCULAR | Status: DC | PRN
  Administered 2013-06-16 (×2): 50 ug via INTRAVENOUS
  Administered 2013-06-16: 100 ug via INTRAVENOUS
  Administered 2013-06-16 (×3): 50 ug via INTRAVENOUS

## 2013-06-16 MED ORDER — HEPARIN SOD (PORK) LOCK FLUSH 100 UNIT/ML IV SOLN
INTRAVENOUS | Status: AC
  Filled 2013-06-16: qty 5

## 2013-06-16 MED ORDER — CEFAZOLIN SODIUM-DEXTROSE 2-3 GM-% IV SOLR
2.0000 g | INTRAVENOUS | Status: AC
  Administered 2013-06-16: 2 g via INTRAVENOUS

## 2013-06-16 MED ORDER — ROCURONIUM BROMIDE 100 MG/10ML IV SOLN
INTRAVENOUS | Status: AC
  Filled 2013-06-16: qty 1

## 2013-06-16 MED ORDER — MIDAZOLAM HCL 5 MG/5ML IJ SOLN
INTRAMUSCULAR | Status: DC | PRN
  Administered 2013-06-16: 2 mg via INTRAVENOUS

## 2013-06-16 MED ORDER — STERILE WATER FOR INJECTION IJ SOLN
INTRAMUSCULAR | Status: AC
  Filled 2013-06-16: qty 20

## 2013-06-16 MED ORDER — CHLORHEXIDINE GLUCONATE 4 % EX LIQD: 1.0000 "application " | Freq: Once | CUTANEOUS | Status: DC

## 2013-06-16 MED ORDER — SODIUM CHLORIDE 0.9 % IJ SOLN
INTRAMUSCULAR | Status: AC
  Filled 2013-06-16: qty 20

## 2013-06-16 MED ORDER — SUCCINYLCHOLINE CHLORIDE 20 MG/ML IJ SOLN
INTRAMUSCULAR | Status: DC | PRN
  Administered 2013-06-16: 100 mg via INTRAVENOUS

## 2013-06-16 MED ORDER — SUCCINYLCHOLINE CHLORIDE 20 MG/ML IJ SOLN
INTRAMUSCULAR | Status: AC
  Filled 2013-06-16: qty 1

## 2013-06-16 MED ORDER — HYDROCODONE-ACETAMINOPHEN 5-325 MG PO TABS: 1.0000 | ORAL_TABLET | Freq: Four times a day (QID) | ORAL | Status: DC | PRN

## 2013-06-16 MED ORDER — ONDANSETRON HCL 4 MG/2ML IJ SOLN
INTRAMUSCULAR | Status: AC
  Filled 2013-06-16: qty 2

## 2013-06-16 MED ORDER — SODIUM CHLORIDE 0.9 % IR SOLN
Freq: Once | Status: AC
  Administered 2013-06-16: 09:00:00
  Filled 2013-06-16: qty 1.2

## 2013-06-16 MED ORDER — NEOSTIGMINE METHYLSULFATE 1 MG/ML IJ SOLN
INTRAMUSCULAR | Status: AC
  Filled 2013-06-16: qty 10

## 2013-06-16 MED ORDER — FENTANYL CITRATE 0.05 MG/ML IJ SOLN
25.0000 ug | INTRAMUSCULAR | Status: DC | PRN
  Administered 2013-06-16 (×2): 50 ug via INTRAVENOUS

## 2013-06-16 MED ORDER — ONDANSETRON HCL 4 MG/2ML IJ SOLN
INTRAMUSCULAR | Status: DC | PRN
  Administered 2013-06-16: 4 mg via INTRAVENOUS

## 2013-06-16 MED ORDER — FENTANYL CITRATE 0.05 MG/ML IJ SOLN
INTRAMUSCULAR | Status: AC
  Filled 2013-06-16: qty 2

## 2013-06-16 MED ORDER — HYDROCODONE-ACETAMINOPHEN 5-325 MG PO TABS: 1.0000 | ORAL_TABLET | ORAL | Status: DC | PRN

## 2013-06-16 MED ORDER — PROPOFOL 10 MG/ML IV BOLUS
INTRAVENOUS | Status: AC
  Filled 2013-06-16: qty 20

## 2013-06-16 MED ORDER — LACTATED RINGERS IV SOLN
INTRAVENOUS | Status: DC | PRN
  Administered 2013-06-16: 07:00:00 via INTRAVENOUS

## 2013-06-16 MED ORDER — DEXAMETHASONE SODIUM PHOSPHATE 4 MG/ML IJ SOLN
INTRAMUSCULAR | Status: DC | PRN
  Administered 2013-06-16: 10 mg via INTRAVENOUS

## 2013-06-16 MED ORDER — FENTANYL CITRATE 0.05 MG/ML IJ SOLN
INTRAMUSCULAR | Status: AC
  Filled 2013-06-16: qty 5

## 2013-06-16 MED ORDER — SODIUM CHLORIDE 0.9 % IJ SOLN
INTRAMUSCULAR | Status: AC
  Filled 2013-06-16: qty 6

## 2013-06-16 MED ORDER — MIDAZOLAM HCL 2 MG/2ML IJ SOLN
INTRAMUSCULAR | Status: AC
  Filled 2013-06-16: qty 2

## 2013-06-16 MED ORDER — PROMETHAZINE HCL 25 MG/ML IJ SOLN
INTRAMUSCULAR | Status: AC
  Filled 2013-06-16: qty 1

## 2013-06-16 MED ORDER — SODIUM CHLORIDE 0.9 % IR SOLN
Status: DC | PRN
  Administered 2013-06-16: 2000 mL

## 2013-06-16 MED ORDER — MEPERIDINE HCL 50 MG/ML IJ SOLN: 6.2500 mg | INTRAMUSCULAR | Status: DC | PRN

## 2013-06-16 MED ORDER — KETOROLAC TROMETHAMINE 30 MG/ML IJ SOLN
INTRAMUSCULAR | Status: DC | PRN
  Administered 2013-06-16: 30 mg via INTRAVENOUS

## 2013-06-16 MED ORDER — PROPOFOL 10 MG/ML IV BOLUS
INTRAVENOUS | Status: DC | PRN
  Administered 2013-06-16: 150 mg via INTRAVENOUS
  Administered 2013-06-16: 50 mg via INTRAVENOUS

## 2013-06-16 MED ORDER — GLYCOPYRROLATE 0.2 MG/ML IJ SOLN
INTRAMUSCULAR | Status: AC
  Filled 2013-06-16: qty 3

## 2013-06-16 MED ORDER — LIDOCAINE HCL (CARDIAC) 20 MG/ML IV SOLN
INTRAVENOUS | Status: AC
  Filled 2013-06-16: qty 5

## 2013-06-16 SURGICAL SUPPLY — 59 items
APPLIER CLIP 9.375 MED OPEN (MISCELLANEOUS) ×3
BAG DECANTER FOR FLEXI CONT (MISCELLANEOUS) ×3 IMPLANT
BANDAGE ELASTIC 6 VELCRO ST LF (GAUZE/BANDAGES/DRESSINGS) IMPLANT
BENZOIN TINCTURE PRP APPL 2/3 (GAUZE/BANDAGES/DRESSINGS) ×3 IMPLANT
BIOPATCH WHT 1IN DISK W/4.0 H (GAUZE/BANDAGES/DRESSINGS) ×3 IMPLANT
BLADE HEX COATED 2.75 (ELECTRODE) ×3 IMPLANT
BLADE SURG 15 STRL LF DISP TIS (BLADE) ×6 IMPLANT
BLADE SURG 15 STRL SS (BLADE) ×3
CANISTER SUCTION 2500CC (MISCELLANEOUS) ×3 IMPLANT
CHLORAPREP W/TINT 10.5 ML (MISCELLANEOUS) ×3 IMPLANT
CHLORAPREP W/TINT 26ML (MISCELLANEOUS) ×3 IMPLANT
CLEANER TIP ELECTROSURG 2X2 (MISCELLANEOUS) ×3 IMPLANT
CLIP APPLIE 9.375 MED OPEN (MISCELLANEOUS) ×2 IMPLANT
DECANTER SPIKE VIAL GLASS SM (MISCELLANEOUS) ×3 IMPLANT
DERMABOND ADVANCED (GAUZE/BANDAGES/DRESSINGS) ×1
DERMABOND ADVANCED .7 DNX12 (GAUZE/BANDAGES/DRESSINGS) ×2 IMPLANT
DRAIN CHANNEL RND F F (WOUND CARE) ×3 IMPLANT
DRAPE C-ARM 42X120 X-RAY (DRAPES) ×3 IMPLANT
DRAPE LAPAROTOMY TRNSV 102X78 (DRAPE) ×3 IMPLANT
DRAPE PED LAPAROTOMY (DRAPES) ×3 IMPLANT
DRSG TEGADERM 2-3/8X2-3/4 SM (GAUZE/BANDAGES/DRESSINGS) ×6 IMPLANT
ELECT COATED BLADE 2.86 ST (ELECTRODE) ×3 IMPLANT
ELECT REM PT RETURN 9FT ADLT (ELECTROSURGICAL) ×3
ELECTRODE REM PT RTRN 9FT ADLT (ELECTROSURGICAL) ×2 IMPLANT
EVACUATOR SILICONE 100CC (DRAIN) ×3 IMPLANT
GAUZE SPONGE 2X2 8PLY STRL LF (GAUZE/BANDAGES/DRESSINGS) ×2 IMPLANT
GAUZE SPONGE 4X4 16PLY XRAY LF (GAUZE/BANDAGES/DRESSINGS) ×3 IMPLANT
GLOVE BIOGEL PI IND STRL 7.0 (GLOVE) ×4 IMPLANT
GLOVE BIOGEL PI INDICATOR 7.0 (GLOVE) ×2
GLOVE SURG SIGNA 7.5 PF LTX (GLOVE) ×6 IMPLANT
GOWN STRL REUS W/TWL LRG LVL3 (GOWN DISPOSABLE) IMPLANT
GOWN STRL REUS W/TWL XL LVL3 (GOWN DISPOSABLE) ×12 IMPLANT
KIT BASIN OR (CUSTOM PROCEDURE TRAY) ×3 IMPLANT
KIT PORT POWER 8FR ISP CVUE (Catheter) ×3 IMPLANT
KIT POWER CATH 8FR (Catheter) IMPLANT
MARKER SKIN DUAL TIP RULER LAB (MISCELLANEOUS) ×3 IMPLANT
NEEDLE HYPO 22GX1.5 SAFETY (NEEDLE) IMPLANT
NEEDLE HYPO 25X1 1.5 SAFETY (NEEDLE) ×6 IMPLANT
NS IRRIG 1000ML POUR BTL (IV SOLUTION) ×6 IMPLANT
PACK BASIC VI WITH GOWN DISP (CUSTOM PROCEDURE TRAY) ×3 IMPLANT
PENCIL BUTTON HOLSTER BLD 10FT (ELECTRODE) ×6 IMPLANT
SOL PREP POV-IOD 16OZ 10% (MISCELLANEOUS) IMPLANT
SPONGE GAUZE 2X2 STER 10/PKG (GAUZE/BANDAGES/DRESSINGS) ×1
SPONGE GAUZE 4X4 12PLY (GAUZE/BANDAGES/DRESSINGS) IMPLANT
SPONGE LAP 4X18 X RAY DECT (DISPOSABLE) IMPLANT
STRIP CLOSURE SKIN 1/2X4 (GAUZE/BANDAGES/DRESSINGS) ×3 IMPLANT
STRIP CLOSURE SKIN 1/4X4 (GAUZE/BANDAGES/DRESSINGS) IMPLANT
SUT ETHILON 2 0 PS N (SUTURE) ×3 IMPLANT
SUT VIC AB 3-0 SH 18 (SUTURE) ×3 IMPLANT
SUT VIC AB 5-0 P-3 18XBRD (SUTURE) IMPLANT
SUT VIC AB 5-0 P3 18 (SUTURE)
SUT VIC AB 5-0 PS2 18 (SUTURE) ×3 IMPLANT
SYR 20CC LL (SYRINGE) ×3 IMPLANT
SYR BULB IRRIGATION 50ML (SYRINGE) ×3 IMPLANT
SYR CONTROL 10ML LL (SYRINGE) ×6 IMPLANT
SYRINGE 10CC LL (SYRINGE) ×3 IMPLANT
TOWEL OR 17X26 10 PK STRL BLUE (TOWEL DISPOSABLE) ×3 IMPLANT
TOWEL OR NON WOVEN STRL DISP B (DISPOSABLE) ×3 IMPLANT
YANKAUER SUCT BULB TIP 10FT TU (MISCELLANEOUS) ×3 IMPLANT

## 2013-06-16 NOTE — Anesthesia Postprocedure Evaluation (Signed)
  Anesthesia Post-op Note  Patient: [name]  Procedure(s) Performed: Procedure(s) (LRB): POWER PORT PLACEMENT (N/A) LEFT AXILLARY NODE DISSECTION (Left)  Patient Location: PACU  Anesthesia Type: General  Level of Consciousness: awake and alert   Airway and Oxygen Therapy: Patient Spontanous Breathing  Post-op Pain: mild  Post-op Assessment: Post-op Vital signs reviewed, Patient's Cardiovascular Status Stable, Respiratory Function Stable, Patent Airway and No signs of Nausea or vomiting  Last Vitals:  Filed Vitals:   06/16/13 1334  BP: 121/70  Pulse: 83  Temp:   Resp: 16    Post-op Vital Signs: stable   Complications: No apparent anesthesia complications

## 2013-06-16 NOTE — Progress Notes (Signed)
Portable upright chest x-ray done. 

## 2013-06-16 NOTE — Discharge Instructions (Signed)
[facility] - DISCHARGE INSTRUCTIONS TO PATIENT  Activity:  Driving - May drive in 2 or 3 days, if doing well   Lifting - No lifrting greater than 15 pounds for one week  Wound Care:   Empty drain once or twice a day.  Record the amount and bring that sheet with you.  Diet:  As tolerated  Follow up appointment:  Call Dr. Pollie Friar office [facility]) at [phone] for an appointment in one week.  Medications and dosages:  Resume your home medications.  You have a prescription for:  Vicodin  Call Dr. Lucia Gaskins or his office  [phone]) if you have:  Temperature greater than 100.4,  Persistent nausea and vomiting,  Severe uncontrolled pain,  Redness, tenderness, or signs of infection (pain, swelling, redness, odor or green/yellow discharge around the site),  Difficulty breathing, headache or visual disturbances,  Any other questions or concerns you may have after discharge.  In an emergency, call 911 or go to an Emergency Department at a nearby hospital.

## 2013-06-16 NOTE — Anesthesia Preprocedure Evaluation (Addendum)
Anesthesia Evaluation  Patient identified by MRN, date of birth, ID band Patient awake    Reviewed: Allergy & Precautions, H&P , NPO status , Patient's Chart, lab work & pertinent test results  Airway Mallampati: II TM Distance: >3 FB Neck ROM: full    Dental no notable dental hx.    Pulmonary neg pulmonary ROS,  breath sounds clear to auscultation  Pulmonary exam normal       Cardiovascular Exercise Tolerance: Good negative cardio ROS  Rhythm:regular Rate:Normal     Neuro/Psych negative neurological ROS  negative psych ROS   GI/Hepatic negative GI ROS, Neg liver ROS,   Endo/Other  negative endocrine ROS  Renal/GU negative Renal ROS  negative genitourinary   Musculoskeletal   Abdominal   Peds  Hematology negative hematology ROS (+)   Anesthesia Other Findings   Reproductive/Obstetrics negative OB ROS                           Anesthesia Physical Anesthesia Plan  ASA: II  Anesthesia Plan: General   Post-op Pain Management:    Induction:   Airway Management Planned: Oral ETT  Additional Equipment:   Intra-op Plan:   Post-operative Plan:   Informed Consent: I have reviewed the patients History and Physical, chart, labs and discussed the procedure including the risks, benefits and alternatives for the proposed anesthesia with the patient or authorized representative who has indicated his/her understanding and acceptance.   Dental Advisory Given  Plan Discussed with: CRNA  Anesthesia Plan Comments:        Anesthesia Quick Evaluation

## 2013-06-16 NOTE — Telephone Encounter (Signed)
MEDICAL RECORDS FAXED TO [facility] @ [phone].  RELEASE SCANNED.

## 2013-06-16 NOTE — Preoperative (Signed)
Beta Blockers   Reason not to administer Beta Blockers:Not Applicable 

## 2013-06-16 NOTE — Interval H&P Note (Signed)
History and Physical Interval Note:  06/16/2013 7:21 AM  [name]  has presented today for surgery, with the diagnosis of recurrent left breast cancer  The various methods of treatment have been discussed with the patient and family.  Brother and sister here.  After consideration of risks, benefits and other options for treatment, the patient has consented to  Procedure(s): POWER PORT PLACEMENT (N/A) LEFT AXILLARY NODE DISSECTION (Left) as a surgical intervention .  The patient's history has been reviewed, patient examined, no change in status, stable for surgery.  I have reviewed the patient's chart and labs.  Questions were answered to the patient's satisfaction.     Malillany Kazlauskas H

## 2013-06-16 NOTE — Op Note (Signed)
06/16/2013  10:14 AM  PATIENT:  [name], 38 y.o., female MRN: [id number] DOB: Jul 27, 1975  PREOP DIAGNOSIS:  recurrent left breast cancer, anticipate chemotherapy  POSTOP DIAGNOSIS:   recurrent left breast cancer  , anticipate chemotherapy  PROCEDURE:   Procedure(s):  POWER PORT PLACEMENT, LEFT AXILLARY NODE DISSECTION  SURGEON:   Alphonsa Overall, M.D.  ASSISTANT:   None  ANESTHESIA:   general  Anesthesiologist: Montez Hageman, MD CRNA: Verlin Grills, CRNA; Maxwell Caul, CRNA  General  EBL:  75  ml  COUNTS CORRECT:  YES  INDICATIONS FOR PROCEDURE:  [name] is a 38 y.o. (DOB: 01-23-1976) AA female whose primary care physician is POWELL,ELMIRA, PA-C and comes for power port placement for the treatment of left breast cancer cancer.  Dr. Jana [name] is her treating oncologist.   The indications and risks of the surgery were explained to the patient.  The risks include, but are not limited to, infection, bleeding, pneumothorax, nerve injury, and thrombosis of the vein.  OPERATIVE NOTE:  The patient was taken to Room #11 at [facility].  Anesthesia was provided by Anesthesiologist: Montez Hageman, MD CRNA: Verlin Grills, CRNA; Maxwell Caul, CRNA.  At the beginning of the operation, the patient was given 2 gm Ancef.   A time out was held and the surgery checklist reviewed.   I first did the left axillary dissection.  I prepped her left axilla with chloroprep.  I made an incision into the left axilla.  I found the axillary vein.  She had two primary tributaries which were divided.  I dissected level 2 and up to level 3 left axillary nodes.  She had one large node, just below the left axillary vein.  I identified the long thoracic nerve of [name] and thoracodorsal nerve during the dissection.  There were several palpable nodes in the specimen.  I left no palpable nodes behind.  The wound was closed with 3-0 Vicryl and 5-0 Monocryl.  The skin was painted with dermabond.  I left a  58 [place] Blake drain in the wound.   The patient was repositioned and draped for the power port placement.   The patient was placed in Trendelenburg position. She had a roll placed under her back, and had the upper chest/neck prepped with Chloroprep. I tried to acces the right subclavian vein, but could not get the wire to thread.  I used the [place] to find the right IJ and the right IJ vein was accessed with a 16 gauge needle and a guide wire threaded through the needle into the vein.  The position of the wire was checked with fluoroscopy.   I then developed a pocket in the upper inner aspect of the right chest for the port reservoir.  I used the [employer] for venous access.  The reservoir was sewn in place with a 3-0 Vicryl suture.  The reservoir had been flushed with dilute (10 units/cc) heparin.   I then passed the silastic tubing from the reservoir incision to the right internal jugular vein stick site and used the 8 French introducer to pass it into the vein.  The tip of the silastic catheter was position at the junction of the SVC and the right atrium under fluoroscopy.  The silastic catheter was then attached to the port with the bayonet device.     The entire port and tubing were checked with fluoroscopy and then the port was flushed with concentrated heparin (100 units/cc).  The wounds were then closed with 3-0 vicryl subcutaneous sutures and the skin closed with a 5-0 Vicryl suture.  The skin was painted with tincture of benzoin and steri-stripped.   The patient was transferred to the recovery room in good condition.  The sponge and needle count were correct at the end of the case.  A CXR is ordered for port placement and pending at the time of this note.  Alphonsa Overall, MD, [facility] Pager: [phone] Office phone:  [phone]

## 2013-06-16 NOTE — Transfer of Care (Signed)
Immediate Anesthesia Transfer of Care Note  Patient: [name]  Procedure(s) Performed: Procedure(s) (LRB): POWER PORT PLACEMENT (N/A) LEFT AXILLARY NODE DISSECTION (Left)  Patient Location: PACU  Anesthesia Type: General  Level of Consciousness: sedated, patient cooperative and responds to stimulation  Airway & Oxygen Therapy: Patient Spontanous Breathing and Patient connected to face mask oxgen  Post-op Assessment: Report given to PACU RN and Post -op Vital signs reviewed and stable  Post vital signs: Reviewed and stable  Complications: No apparent anesthesia complications

## 2013-06-16 NOTE — Progress Notes (Signed)
X-ray results noted 

## 2013-06-16 NOTE — H&P (View-Only) (Signed)
Re:   [name] DOB:   1976/01/12 MRN:   [id number]  [place]  ASSESSMENT AND PLAN: 1.  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  Oncologist - [name]/[name]  Plan: 1) PET scan, 2) if PET negative, do completion left axillary dissection, if PET postive, leave axillary nodes, 3) Power port (she did not have one the first time for treatment), 4) refer for gentics  [MRI - 1/302015 - 1. Examination is positive for hypermetabolic left axillary and left supraclavicular lymph node metastasis. 2. No evidence for distant metastatic disease.   Discussed with patient, Drs. [name] and Wentwoth - plan: first surgery - completion left axillary dissection and power port placement, secondly chemotherapy, and thirdly radiation therapy.  I discussed risks of surgery including bleeding, infection, nerve injury, lymphedema, and pneumothorax. DN 05/31/2013]  2.  History of bilateral mastectomy for breast cancer  No chief complaint on file.  REFERRING PHYSICIAN: POWELL,ELMIRA, PA-C  HISTORY OF PRESENT ILLNESS: [name] is a 38 y.o. (DOB: 09/06/1975)  AA  female whose primary care physician is [name],ELMIRA, PA-C and comes to [facility]. Comes with boyfriend, [name].  Ms. [name] underwent bilateral mastectomy, chemothx with CMF and 15 months of tamoxifen for a left breast cancer identified in October of 2011 in [place].  But she did not tolerate the tamoxifen and stopped it herself.  She was treated by [facility] in [facility], Dr. Annette [name].  She had a bilateral mastectomy 01/28/2010.   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 2/6 left axillary nodes showed isolated tumor cells, but the others were negative.  The cancer was ER/PR positive and Her2Neu negative.  She was  treated with CMF starting 02/22/2010 - 07/05/2010.  Tissue expander 'replaced" by Dr. Westley [name] on 09/11/2010 after right side got infected Oncotype DX score of 22 with recurrence rate of 14%.  Recent evaluation showed a left axillary mass.  She underwent a core biopsy of the left axilla on 05/05/2013 (Accession#: [id number]) which showed metastatic mammary carcinoma.  MRI - 05/02/2013 - Left axillary lymphadenopathy. Second-look ultrasound of the left  axilla is recommended for further evaluation and possible biopsy targeting if needed.  No evidence for malignancy in the right mastectomy bed.  She has no children.  Her LMP was 05/02/2013.  She is on no hormone therapy. She underwent BRAC testing which she says was negative.   No past medical history on file.   No past surgical history on file.    No current outpatient prescriptions on file.   No current facility-administered medications for this visit.      Allergies  Allergen Reactions  . Gadolinium Derivatives Hives    After gado injection, pt had a few small hives on left breast area. Treated with benadryl by dr [name]     REVIEW OF SYSTEMS: Skin:  No history of rash.  No history of abnormal moles. Infection:  She did have an infection post mastectomy/tissue expander which necessitated removing the tissue expander.. Neurologic:  No history of stroke.  No history of seizure.  No history of headaches. Cardiac:  No history of hypertension. No history of heart disease.   No history of seeing a cardiologist. Pulmonary:  Does not smoke cigarettes.  No asthma or bronchitis.  No OSA/CPAP.  Endocrine:  No diabetes. No thyroid  disease. Gastrointestinal:  No history of stomach disease.  No history of liver disease.  No history of gall bladder disease.  No history of pancreas disease.  No history of colon disease. Urologic:  No history of kidney stones.  No history of bladder infections. Musculoskeletal:  No history of joint or back  disease. Hematologic:  No bleeding disorder.  No history of anemia.  Not anticoagulated. Psycho-social:  The patient is oriented.     SOCIAL and FAMILY HISTORY: Unmarried. Boyfriend, [name], with patient. She moved to [place] in June 2013 to be near boyfriend. No children. Works for [employer], Detention Officer  PHYSICAL EXAM: There were no vitals taken for this visit.  General: AA F who is alert and generally healthy appearing.  HEENT: Normal. Pupils equal. Neck: Supple. No mass.  No thyroid mass. Lymph Nodes:  No supraclavicular or cervical nodes.  Left axillary scar, but I do not feel an obvious mass. Lungs: Clear to auscultation and symmetric breath sounds. Heart:  RRR. No murmur or rub. Breast:  Right - reconstructed, no mass  Left - reconstructed, no mass  Abdomen: Soft. No mass. No tenderness. No hernia. Normal bowel sounds.  No abdominal scars. Rectal: Not done. Extremities:  Good strength and ROM  in upper and lower extremities. Neurologic:  Grossly intact to motor and sensory function. Psychiatric: Has normal mood and affect. Behavior is normal.   DATA REVIEWED: Epic notes  Meggen Spaziani, MD,  FACS [facility] [address]  [address]   [place], [place]    [address] Phone:  [phone] FAX:  [phone]  

## 2013-06-17 ENCOUNTER — Encounter (HOSPITAL_COMMUNITY): Payer: Self-pay | Admitting: Surgery

## 2013-06-20 ENCOUNTER — Ambulatory Visit (HOSPITAL_BASED_OUTPATIENT_CLINIC_OR_DEPARTMENT_OTHER): Admit: 2013-06-20 | Payer: Self-pay | Admitting: Surgery

## 2013-06-20 ENCOUNTER — Encounter (HOSPITAL_BASED_OUTPATIENT_CLINIC_OR_DEPARTMENT_OTHER): Payer: Self-pay

## 2013-06-20 SURGERY — NODE DISSECTION
Anesthesia: General | Site: Breast

## 2013-06-22 ENCOUNTER — Ambulatory Visit (INDEPENDENT_AMBULATORY_CARE_PROVIDER_SITE_OTHER): Payer: 59 | Admitting: Surgery

## 2013-06-22 ENCOUNTER — Encounter (INDEPENDENT_AMBULATORY_CARE_PROVIDER_SITE_OTHER): Payer: Self-pay | Admitting: Surgery

## 2013-06-22 VITALS — BP 108/68 | HR 76 | Temp 98.2°F | Resp 14 | Ht 67.5 in | Wt 203.6 lb

## 2013-06-22 DIAGNOSIS — C50919 Malignant neoplasm of unspecified site of unspecified female breast: Secondary | ICD-10-CM

## 2013-06-22 DIAGNOSIS — C773 Secondary and unspecified malignant neoplasm of axilla and upper limb lymph nodes: Secondary | ICD-10-CM

## 2013-06-22 NOTE — Progress Notes (Signed)
Re:   [name] DOB:   [birth year] MRN:   [id number]  [name]  ASSESSMENT AND PLAN: 1.  Recurrent breast cancer in left axilla  Core biopsy left axillary lymph node -05/05/2013 (Accession#: [id number]) - metastatic mammary carcinoma  The patient has already had bilateral mastectomies.  Oncologist - Wentworth/Magrinat  I did a left axillary node dissection on 06/17/2103.  Path (Accession#: [id number]) - 10/15 nodes with extranodal tumor  She is still putting out about 50 cc per day - so I left her left axillary drain in place.  She'll see me back in one week for wound/drain check.  She sees Dr. Jana Hakim on Friday - 06/24/2013.  2.  Hypermetabolic left supraclavicular node seen on PET - 05/27/2013 3.  Power port placement, right IJ - 06/16/2013 - D. Vikas Wegmann  4.  History of bilateral mastectomy for breast cancer  Chief Complaint  Patient presents with  . Routine Post Op    f/u axillary   REFERRING PHYSICIAN: POWELL,ELMIRA, PA-C  HISTORY OF PRESENT ILLNESS: [name] is a 38 y.o. (DOB: 10-05-1975)  AA  female whose primary care physician is POWELL,ELMIRA, PA-C and comes for followup of power port placement and left axillary lymph node dissection. She comes by self. I gave her a copy her of path and we talked a little about this. She is not moving her left arm well.  I went over some ideas of increasing her left arm movement.  History of breast cancer: [name] underwent bilateral mastectomy, chemothx with CMF and 15 months of tamoxifen for a left breast cancer identified in October of 2011 in [place].  But she did not tolerate the tamoxifen and stopped it herself.  She was treated by [facility] in [facility], Dr. Annette Stable.  She had a bilateral mastectomy 01/28/2010.   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 2/6 left axillary nodes showed isolated tumor cells, but the others were  negative.  The cancer was ER/PR positive and Her2Neu negative.  She was treated with CMF starting 02/22/2010 - 07/05/2010.  Tissue expander 'replaced" by Dr. Westley Chandler on 09/11/2010 after right side got infected Oncotype DX score of 22 with recurrence rate of 14%.  Recent evaluation showed a left axillary mass.  She underwent a core biopsy of the left axilla on 05/05/2013 (Accession#: [id number]) which showed metastatic mammary carcinoma.  She has no children.  Her LMP was 05/02/2013.  She is on no hormone therapy. She underwent BRAC testing which she says was negative.    Past Medical History  Diagnosis Date  . Cancer     breast ca     Current Outpatient Prescriptions  Medication Sig Dispense Refill  . HYDROcodone-acetaminophen (NORCO/VICODIN) 5-325 MG per tablet Take 1-2 tablets by mouth every 6 (six) hours as needed.  30 tablet  0   No current facility-administered medications for this visit.      Allergies  Allergen Reactions  . Sulfa Antibiotics Swelling  . Gadolinium Derivatives Hives    After gado injection, pt had a few small hives on left breast area. Treated with benadryl by dr Janeece Fitting   . Petrolatum Rash    REVIEW OF SYSTEMS: Infection:  She did have an infection post mastectomy/tissue expander which necessitated removing the tissue expander..  SOCIAL and FAMILY HISTORY: Unmarried. Boyfriend, Susa Raring, has come with patient in the past. She moved to [facility] in June 2013 to be  near boyfriend. No children. Works for [employer], Corporate treasurer  PHYSICAL EXAM: BP 108/68  Pulse 76  Temp(Src) 98.2 F (36.8 C) (Oral)  Resp 14  Ht 5' 7.5" (1.715 m)  Wt 203 lb 9.6 oz (92.352 kg)  BMI 31.40 kg/m2  LMP 05/27/2013  General: AA F who is alert and generally healthy appearing.  HEENT: Normal. Pupils equal. Neck: Supple. Sore at site of IJ stick on right. Lymph Nodes:  No supraclavicular or cervical nodes.  Left axillary incision.  Drain in left axilla.  It  has drained about 50 cc per day the last few days. Breast:  Right - reconstructed, no mass.  Power port in right upper chest.  The wound is clean.  Left - reconstructed, no mass  DATA REVIEWED: Path report to patient.  Stacy Overall, MD,  [facility], [address]  [place], [place]    [place] Phone:  [phone] FAX:  [phone]

## 2013-06-23 ENCOUNTER — Encounter (INDEPENDENT_AMBULATORY_CARE_PROVIDER_SITE_OTHER): Payer: 59 | Admitting: Surgery

## 2013-06-24 ENCOUNTER — Ambulatory Visit (HOSPITAL_BASED_OUTPATIENT_CLINIC_OR_DEPARTMENT_OTHER): Payer: 59 | Admitting: Oncology

## 2013-06-24 ENCOUNTER — Telehealth: Payer: Self-pay | Admitting: Oncology

## 2013-06-24 ENCOUNTER — Telehealth: Payer: Self-pay | Admitting: *Deleted

## 2013-06-24 VITALS — BP 135/95 | HR 66 | Temp 98.1°F | Resp 20 | Ht 67.5 in | Wt 204.7 lb

## 2013-06-24 DIAGNOSIS — C773 Secondary and unspecified malignant neoplasm of axilla and upper limb lymph nodes: Secondary | ICD-10-CM

## 2013-06-24 DIAGNOSIS — C50919 Malignant neoplasm of unspecified site of unspecified female breast: Secondary | ICD-10-CM

## 2013-06-24 NOTE — Progress Notes (Signed)
[place]  Telephone:([phone]) [phone] Fax:([phone]) [phone]     ID: [name] OB: 40-04-1975  MR#: [id number]  MOL#:[id number]  PCP: Colvin Caroli GYN:   SU: Alphonsa Overall OTHER MD: Lonia Chimera [phone]  CHIEF COMPLAINT: " I have breast cancer"  HISTORY OF PRESENT ILLNESS: Cleo had bilateral diagnostic mammography [facility] in [place] 11/30/2009 showing a 1.5 cm mass at the 9:00 position of the left breast with some satellites. A biopsy of the mass in question [place] [facility] showed ([id number]) and invasive lobular carcinoma, which was estrogen and progesterone receptor both 100% positive, both with strong staining intensity, but HER-2 negative at 1+. Bilateral breast MRI 12/13/2009 in [place] showed in the left breast an irregularly marginated mass measuring 3.2 cm. There were 4 or 5 separate satellite lesions laterally and superior to the primary.  Accordingly, after appropriate discussion, the patient underwent bilateral mastectomies with left sentinel lymph node sampling 01/02/2010. The right breast was benign. The left breast showed a 2.3 cm invasive lobular carcinoma, grade 3, with a total of 6 sentinel lymph nodes removed, all negative, although 2 showed isolated tumor cells by immunostaining. Margins were negative.  An Oncotype BX was sent, with a recurrence score of 22, predicting a risk of distant recurrence within 10 years with 14% of the patients only systemic treatment was tamoxifen. The patient was treated with CMF chemotherapy between 02/22/2010 and 07/05/2010, receiving 7 cycles at which point the patient refused further chemotherapy though there have been no major toxicities at according to the oncology note from Dr. Brent General. The patient was then started on tamoxifen 08/09/2010. By her cancer took approximately 12-15 months then stopped because of hot flashes and insomnia problems.  More  recently, the patient noted a change in her left axilla andunderwent bilateral breast MRI January to 2015 at [facility]. The patient has bilateral submuscular silicone implants in place. The breast were unremarkable, but there were several lymph nodes with cortical thickening in the left axilla including a dominant 1 measuring 1.2 cm in the short axis. Ultrasonography of this area identified the lymph node in question and the patient underwent biopsy of the left axillary lymph node 05/05/2013. This showed ([id number]) near-total replacement of the lymph node by metastatic carcinoma. This was 96% estrogen receptor positive, and 84% progesterone receptor positive, both with strong staining intensity. The MIB-1 was 20%. HER-2 determination is pending.  The patient's subsequent history is as detailed below  INTERVAL HISTORY: Fajr returns today for followup of her breast cancer. Since her last visit here she had restaging studies namely a CT of the chest and a PET scan. These showed uptake in the left supraclavicular area in the left axilla. Thankfully there was no stage IV disease. She then proceeded to left axillary lymph node dissection 06/16/2013. A total of 15 lymph nodes were removed, 10 of which were involved with tumor, at least half of which showed extracapsular extension (discussed with pathology).  REVIEW OF SYSTEMS: Quanna did "well" with the surgery, but she is still very tender and still has a drain in place. She has not been able to do the "walking up a wall" exercises and is not quite ready to be referred for physical therapy to our lymphedema clinic. She is sleeping poorly, has significant pain in the surgical site as well as a little bit of low back pain which is chronic and not more intense or persistent than before. There has not been  a bleeding or fever. She is having some hot flashes. A detailed review of systems today was otherwise stable                 PAST MEDICAL  HISTORY: Past Medical History  Diagnosis Date  . Cancer     breast ca    PAST SURGICAL HISTORY: Past Surgical History  Procedure Laterality Date  . Bilateral total mastectomy with axillary lymph node dissection    . Tonsillectomy    . Leep    . Portacath placement N/A 06/16/2013    Procedure: POWER PORT PLACEMENT;  Surgeon: Shann Medal, MD;  Location: [facility];  Service: General;  Laterality: N/A;  . Axillary lymph node dissection Left 06/16/2013    Procedure: LEFT AXILLARY NODE DISSECTION;  Surgeon: Shann Medal, MD;  Location: [facility];  Service: General;  Laterality: Left;    FAMILY HISTORY Family History  Problem Relation Age of Onset  . Other Mother     glioblastoma  . Other Father     glioblastoma   according to the patient both her parents died from a primary brain cancers, namely we have the stomas, her father at 62, her mother at 53. The patient had one brother and one sister. There is no history of breast or ovarian cancer in the family  GYNECOLOGIC HISTORY:  Menarche age 38. The patient was never carried a child to term. She is still having regular periods, most recently second week in January. The patient took oral contraceptives between the ages of 12 and 33 with no complications  SOCIAL HISTORY:  Marlea works as a Corporate treasurer for [employer] She shares a home with her significant other, Burnice Logan, who works at [employer]. They have no pets.    ADVANCED DIRECTIVES: Not in place   HEALTH MAINTENANCE: History  Substance Use Topics  . Smoking status: Never Smoker   . Smokeless tobacco: Not on file  . Alcohol Use: No     Colonoscopy:  PAP: December 2014  Bone density:  Lipid panel:  Allergies  Allergen Reactions  . Sulfa Antibiotics Swelling  . Gadolinium Derivatives Hives    After gado injection, pt had a few small hives on left breast area. Treated with benadryl by dr Janeece Fitting   . Petrolatum Rash    Current Outpatient  Prescriptions  Medication Sig Dispense Refill  . HYDROcodone-acetaminophen (NORCO/VICODIN) 5-325 MG per tablet Take 1-2 tablets by mouth every 6 (six) hours as needed.  30 tablet  0   No current facility-administered medications for this visit.    OBJECTIVE: Young African American woman Filed Vitals:   06/24/13 1137  BP: 135/95  Pulse: 66  Temp: 98.1 F (36.7 C)  Resp: 20     Body mass index is 31.57 kg/(m^2).    ECOG FS:1 - Symptomatic but completely ambulatory  Ocular: Sclerae unicteric, EOMs full Ear-nose-throat: Oropharynx clear and moist Lymphatic: No cervical or supraclavicular adenopathy Lungs no rales or rhonchi, good excursion bilaterally Heart regular rate and rhythm, no murmur appreciated Abd soft, nontender, positive bowel sounds MSK no focal spinal tenderness, no joint edema Neuro: non-focal, well-oriented, appropriate affect Breasts: The patient is status post bilateral mastectomies with bilateral submuscular implants. The new incision in the left axilla is healing well, with a drain still in place, scant fluid in the bulb. There is no evidence of erythema or swelling. I do not palpate the left supraclavicular lymph nodes noted on scans  LAB RESULTS:  CMP  Component Value Date/Time   NA 140 05/18/2013 0811   K 4.0 05/18/2013 0811   CO2 23 05/18/2013 0811   GLUCOSE 103 05/18/2013 0811   BUN 9.7 05/18/2013 0811   CREATININE 0.8 05/18/2013 0811   CALCIUM 10.0 05/18/2013 0811   PROT 7.6 05/18/2013 0811   ALBUMIN 3.6 05/18/2013 0811   AST 14 05/18/2013 0811   ALT 11 05/18/2013 0811   ALKPHOS 77 05/18/2013 0811   BILITOT <0.20 05/18/2013 0811    I No results found for this basename: SPEP,  UPEP,   kappa and lambda light chains    Lab Results  Component Value Date   WBC 6.0 05/18/2013   NEUTROABS 2.5 05/18/2013   HGB 11.9 05/18/2013   HCT 36.9 05/18/2013   MCV 85.6 05/18/2013   PLT 290 05/18/2013      Chemistry      Component Value Date/Time   NA 140 05/18/2013  0811   K 4.0 05/18/2013 0811   CO2 23 05/18/2013 0811   BUN 9.7 05/18/2013 0811   CREATININE 0.8 05/18/2013 0811      Component Value Date/Time   CALCIUM 10.0 05/18/2013 0811   ALKPHOS 77 05/18/2013 0811   AST 14 05/18/2013 0811   ALT 11 05/18/2013 0811   BILITOT <0.20 05/18/2013 0811       No results found for this basename: LABCA2    No components found with this basename: LABCA125    No results found for this basename: INR,  in the last 168 hours  Urinalysis No results found for this basename: colorurine,  appearanceur,  labspec,  phurine,  glucoseu,  hgbur,  bilirubinur,  ketonesur,  proteinur,  urobilinogen,  nitrite,  leukocytesur    STUDIES: Ct Chest W Contrast  05/27/2013   CLINICAL DATA:  Stage III breast cancer, status post bilateral mastectomy with reconstruction, chemotherapy complete  EXAM: CT CHEST WITH CONTRAST  TECHNIQUE: Multidetector CT imaging of the chest was performed during intravenous contrast administration.  CONTRAST:  77m OMNIPAQUE IOHEXOL 300 MG/ML  SOLN  COMPARISON:  Concurrent PET-CT dated 05/27/2013  FINDINGS: Lungs are clear. No suspicious pulmonary nodules. No pleural effusion or pneumothorax.  Visualized thyroid is unremarkable.  The heart is normal in size.  No pericardial effusion.  No suspicious mediastinal, hilar, or right axillary lymphadenopathy. Multiple enlarged left axillary nodes measuring up to 1.6 cm short axis (series 2/ image 16), suspicious for nodal metastases. Additional left subpectoral nodes measuring up to 5-6 mm short axis (series 2/image 9), non FDG avid. 10 mm short axis left supraclavicular node (series 2/image 2), hypermetabolic on prior PET, suspicious for nodal metastasis.  Status post bilateral mastectomy with reconstruction. Left axillary lymph node dissection.  Visualized upper abdomen is unremarkable.  Old/healed upper sternal deformity (sagittal image 22). Visualized osseous structures are otherwise within normal limits.   IMPRESSION: Status post bilateral mastectomy with reconstruction and left axillary lymph node dissection.  Suspected left supraclavicular and left axillary nodal metastases, as described above.  Otherwise, no evidence of metastatic disease in the chest.   Electronically Signed   By: SJulian HyM.D.   On: 05/27/2013 15:24   Nm Pet Image Initial (pi) Skull Base To Thigh  05/27/2013   CLINICAL DATA:  Initial treatment strategy for breast cancer.  EXAM: NUCLEAR MEDICINE PET SKULL BASE TO THIGH  FASTING BLOOD GLUCOSE:  Value: 874mdl  TECHNIQUE: 15.3 mCi F-18 FDG was injected intravenously. CT data was obtained and used for attenuation correction and anatomic localization only. (  This was not acquired as a diagnostic CT examination.) Additional exam technical data entered on technologist worksheet.  COMPARISON:  None.  FINDINGS: NECK  Enlarged left supraclavicular lymph node exhibits malignant range FDG uptake. This measures 1 cm, image 45/ series 2, and has an SUV max equal to 5.3.  CHEST  Multiple enlarged and hypermetabolic left axillary lymph nodes are identified. Index left axillary lymph node measures 1.5 cm, image 65/series 2. The SUV max associated with this lymph node is equal to 7.3. Bilateral breast implants stress set bilateral subpectoral breast implants are identified. No right supraclavicular adenopathy identified no hypermetabolic or enlarged right axillary lymph nodes.  The trachea appears patent and is midline. There is mild cardiac enlargement. No pericardial effusion. Multiple small lymph nodes are identified within the anterior mediastinum. No hypermetabolic at now adenopathy no hypermetabolic pulmonary nodules or mass is identified.  ABDOMEN/PELVIS  No abnormal hypermetabolic activity within the liver, pancreas, adrenal glands, or spleen. No hypermetabolic lymph nodes in the abdomen or pelvis.  SKELETON  No focal hypermetabolic activity to suggest skeletal metastasis. Previous ORIF of right  femur.  IMPRESSION: 1. Examination is positive for hypermetabolic left axillary and left supraclavicular lymph node metastasis. 2. No evidence for distant metastatic disease.   Electronically Signed   By: Kerby Moors M.D.   On: 05/27/2013 14:16   Dg Chest Port 1 View  06/16/2013   CLINICAL DATA:  Status post port placement  EXAM: PORTABLE CHEST - 1 VIEW  COMPARISON:  None.  FINDINGS: The cardiac shadow is within normal limits. A right-sided chest wall port is seen. The catheter tip is noted in the mid superior vena cava. No pneumothorax is noted. The lungs are clear. Postsurgical changes are noted in the left axilla.  IMPRESSION: No pneumothorax following port placement.   Electronically Signed   By: Inez Catalina M.D.   On: 06/16/2013 10:36   Dg C-arm 1-60 Min-no Report  06/16/2013   CLINICAL DATA: portacath   C-ARM 1-60 MINUTES  Fluoroscopy was utilized by the requesting physician.  No radiographic  interpretation.     ASSESSMENT: 38 y.o. [place] woman  (1) status post bilateral mastectomies with left axillary lymph node sampling [6 nodes removed] 01/02/2010 for a pT2 pN0(i+), stage IIA invasive lobular breast cancer, grade 3, estrogen and progesterone receptor positive, HER-2 not amplified; right breast was benign  (2) Oncotype DX score of 22 predicted a 14% risk of distant recurrence within 10 years if the patient's only systemic treatment is tamoxifen for 5 years  (3) status post CMF(cyclophosphamide, fluorouracil, methotrexate) x7 given between October of 2011 and March of 2012 (7 of 8 planned treatments completed)  (4) tamoxifen started April 2012, discontinued approximately July of 2013 (about 15 months) because of problems with hot flashes and insomnia  (5) pathologically documented left axillary recurrence 05/05/2013, the tumor being again estrogen and progesterone receptor positive, with HER-2 not amplified, and MIB-1 of 15%. Staging CT/PET 05/27/2013 show left axillary and  supraclavicular recurrence, no distant disease  (6) status post completion left axillary lymph node dissection 06/16/2013 showing a total of 10/15 lymph nodes involved by tumor, with evidence of extracapsular extension (TX N3 = stage IIIC)  (7) to start carboplatin/ docetaxel 07/11/2013, to be repeated every 21 days x6 with Neulasta support  (8) radiation to follow chemotherapy  (9) bilateral salpingo-oophorectomy later this year, followed by aromatase inhibitor therapy for at least 5 years   PLAN: We spent well over 40 minutes going over and  he'll let us situation. NCCN guidelines would suggest bilateral salpingo-oophorectomy and an aromatase inhibitor i for non-visceral recurrence. However I feel her original adjuvant treatment was inadequate. I want to make sure to sterilize any systemic disease, with a goal of cure.  Accordingly we are going to go with carboplatin and docetaxel to be given intravenously every 21 days with Neulasta support. We will do 6 cycles. I have scheduled annual her for "chemotherapy school", and to see me again on March 13, at which time we'll right her antinausea medications. The plan will be to start her treatments on March 16.  She understands that quite aside from the chemotherapy side effects she will be undergoing menopause and I gave her list of the likely symptoms associated with early menopause. She is not planning any case her children, so will proceed to bilateral salpingo-oophorectomy once she completes her chemotherapy (she will not need a hysterectomy.).   Once she completes her reticulocyte chemotherapy, of course, she will need radiation treatments.  Annastyn's work is very physical and she would not be expected to be able to work while receiving aggressive chemotherapy. I have recommended that she apply for temporary disability. She will bring [place] the appropriate papers.  Haruna has a good understanding of the overall plan. She is in agreement with it.  She knows the goal of treatment in her case is cure. She will call with any problems that may develop before her next visit here.   Chauncey Cruel, MD   06/24/2013 12:12 PM

## 2013-06-24 NOTE — Telephone Encounter (Signed)
, °

## 2013-06-24 NOTE — Telephone Encounter (Signed)
Per staff message and POF I have scheduled appts.  JMW  

## 2013-06-27 ENCOUNTER — Telehealth: Payer: Self-pay | Admitting: *Deleted

## 2013-06-27 NOTE — Telephone Encounter (Signed)
Message left by pt requesting a return call to discuss chemotherapy scheduled for 3/16.  " I was supposed to have an area in my collar bone area of lymphnodes but then Dr Jannifer Rodney couldn't find them at my last visit" " and they were why I was going to get more chemo- since Dr Jannifer Rodney couldn't feel them could I be retested before I start chemo to see if I need chemo?"  This RN reviewed chart noted chemotherapy is based on surgical pathology of out of 15 lymph nodes positive for breast cancer recurrence. Per MD note chemo recommendation is based on above.  Call returned to pt and obtained identified VM- message left to return call to this RN or to [facility] breast navigator

## 2013-06-28 ENCOUNTER — Other Ambulatory Visit: Payer: 59

## 2013-06-28 ENCOUNTER — Encounter: Payer: Self-pay | Admitting: *Deleted

## 2013-06-29 ENCOUNTER — Ambulatory Visit (INDEPENDENT_AMBULATORY_CARE_PROVIDER_SITE_OTHER): Payer: 59 | Admitting: Surgery

## 2013-06-29 ENCOUNTER — Encounter (INDEPENDENT_AMBULATORY_CARE_PROVIDER_SITE_OTHER): Payer: Self-pay | Admitting: Surgery

## 2013-06-29 VITALS — BP 128/82 | HR 76 | Temp 98.8°F | Resp 14 | Ht 67.0 in | Wt 206.0 lb

## 2013-06-29 DIAGNOSIS — C773 Secondary and unspecified malignant neoplasm of axilla and upper limb lymph nodes: Secondary | ICD-10-CM

## 2013-06-29 DIAGNOSIS — C50919 Malignant neoplasm of unspecified site of unspecified female breast: Secondary | ICD-10-CM

## 2013-06-29 NOTE — Progress Notes (Addendum)
Re:   [name] DOB:   Mar 03, 1976 MRN:   [id number]  [facility]  ASSESSMENT AND PLAN: 1.  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  Oncologist - [place]/Magrinat  I did a left axillary node dissection on 06/17/2103.  Path (Accession#: [id number]) - 10/15 nodes with extranodal tumor  Left axillary drain removed today.  She needs to work on physical activity with left arm. Made referral to ABC class.  Plan for breast cancer/nodes: 1) chemotx every 3 weeks for 6 courses, 2) Radiation tx, 3) Anti-hormone pill, 4) Oophorectomy  She'll see me back in 6 months.  [Genetic tests were negative.  DN 01/08/2014]  2.  Hypermetabolic left supraclavicular node seen on PET - 05/27/2013 3.  Power port placement, right IJ - 06/16/2013 - D. Alante Weimann 4.  History of bilateral mastectomy for breast cancer  Chief Complaint  Patient presents with  . Routine Post Op    f/u drain   REFERRING PHYSICIAN: POWELL,ELMIRA, PA-C  HISTORY OF PRESENT ILLNESS: [name] is a 38 y.o. (DOB: 1976/04/04)  AA  female whose primary care physician is POWELL,ELMIRA, PA-C and comes for followup of power port placement and left axillary lymph node dissection. She comes by self. She met with Magrinat last week and has a plan going forward.  She needs to work on using her left arm more.  We reviewed exercises and I am sending her to the ABC class.  History of breast cancer: Ms. [name] underwent bilateral mastectomy, chemothx with CMF and 15 months of tamoxifen for a left breast cancer identified in October of 2011 in [place].  But she did not tolerate the tamoxifen and stopped it herself.  She was treated by [facility], Dr. Annette Stable.  She had a bilateral mastectomy 01/28/2010.   Her tumor was poorly differentiated  lobular ca and 2.3 cm in size (but at least 5 different foci). 2/6 left axillary nodes showed isolated tumor cells, but the others were negative.  The cancer was ER/PR positive and Her2Neu negative.  She was treated with CMF starting 02/22/2010 - 07/05/2010.  Tissue expander 'replaced" by Dr. Westley Chandler on 09/11/2010 after right side got infected Oncotype DX score of 22 with recurrence rate of 14%.  Recent evaluation showed a left axillary mass.  She underwent a core biopsy of the left axilla on 05/05/2013 (Accession#: [id number]) which showed metastatic mammary carcinoma.  She has no children.  Her LMP was 05/02/2013.  She is on no hormone therapy. She underwent BRAC testing which she says was negative.   Past Medical History  Diagnosis Date  . Cancer     breast ca    Current Outpatient Prescriptions  Medication Sig Dispense Refill  . HYDROcodone-acetaminophen (NORCO/VICODIN) 5-325 MG per tablet Take 1-2 tablets by mouth every 6 (six) hours as needed.  30 tablet  0   No current facility-administered medications for this visit.     Allergies  Allergen Reactions  . Sulfa Antibiotics Swelling  . Gadolinium Derivatives Hives    After gado injection, pt had a few small hives on left breast area. Treated with benadryl by dr Janeece Fitting   . Petrolatum Rash   REVIEW OF SYSTEMS: Infection:  She did have an infection post mastectomy/tissue expander which necessitated removing the tissue expander.  SOCIAL and FAMILY HISTORY: Unmarried. Boyfriend, Susa Raring, has come  with patient in the past. She moved to [facility] in June 2013 to be near boyfriend. No children. Works for [employer], Corporate treasurer  PHYSICAL EXAM: BP 128/82  Pulse 76  Temp(Src) 98.8 F (37.1 C) (Oral)  Resp 14  Ht 5' 7"  (1.702 m)  Wt 206 lb (93.441 kg)  BMI 32.26 kg/m2  General: AA F who is alert and generally healthy appearing.  HEENT: Normal. Pupils equal. Neck: Supple. Sore at site of IJ stick on  right. Lymph Nodes:  No supraclavicular or cervical nodes.  Left axillary incision looks okay.  She is tender around the drain.  She says that the drain has been less than 20 cc/day (she lost her sheet that had the numbers). Breast:  Right - reconstructed, no mass.  Power port in right upper chest.  The wound is clean.  Left - reconstructed, no mass  DATA REVIEWED: Path report to patient.  Alphonsa Overall, MD,  [facility], [address]  [place], [place]    [place] Phone:  [phone] FAX:  [phone]

## 2013-06-29 NOTE — Patient Instructions (Addendum)
      ABC CLASS After Breast Cancer Class  After Breast Cancer Class is a specially designed exercise class to assist you in a safe recovery after having breast cancer surgery.  In this class you will learn how to get back to full function whether your drains were just removed or if you had surgery a month ago.  This one-time class is held the 1st and 3rd Monday of every month from 11:00 a.m. until 12:00 noon at the [facility] located at [address]  This class is FREE and space is limited.  For more information or to register for the next available class, call [phone].  Class Goals   Understand specific stretches to improve the flexibility of your chest and shoulder.   Learn ways to safely strengthen your upper body and improve your posture.   Understand the warning signs of infection and why you may be at risk for an arm infection.   Learn about Lymphedema and prevention.  **You do not attend this class until after surgery.  Drains must be removed to participate.     Donna Salisbury, PT, CLT Marti Smith, PT, CLT        

## 2013-07-05 ENCOUNTER — Other Ambulatory Visit: Payer: Self-pay | Admitting: Physician Assistant

## 2013-07-05 ENCOUNTER — Telehealth: Payer: Self-pay | Admitting: Oncology

## 2013-07-05 NOTE — Telephone Encounter (Signed)
, °

## 2013-07-06 ENCOUNTER — Encounter: Payer: Self-pay | Admitting: Physician Assistant

## 2013-07-06 ENCOUNTER — Other Ambulatory Visit (HOSPITAL_BASED_OUTPATIENT_CLINIC_OR_DEPARTMENT_OTHER): Payer: 59

## 2013-07-06 ENCOUNTER — Telehealth: Payer: Self-pay | Admitting: *Deleted

## 2013-07-06 ENCOUNTER — Ambulatory Visit (HOSPITAL_BASED_OUTPATIENT_CLINIC_OR_DEPARTMENT_OTHER): Payer: 59 | Admitting: Physician Assistant

## 2013-07-06 VITALS — BP 135/89 | HR 96 | Temp 98.2°F | Resp 20 | Ht 67.0 in | Wt 207.8 lb

## 2013-07-06 DIAGNOSIS — C773 Secondary and unspecified malignant neoplasm of axilla and upper limb lymph nodes: Secondary | ICD-10-CM

## 2013-07-06 DIAGNOSIS — M79609 Pain in unspecified limb: Secondary | ICD-10-CM

## 2013-07-06 DIAGNOSIS — M79622 Pain in left upper arm: Secondary | ICD-10-CM

## 2013-07-06 DIAGNOSIS — C50919 Malignant neoplasm of unspecified site of unspecified female breast: Secondary | ICD-10-CM

## 2013-07-06 DIAGNOSIS — Z170 Estrogen receptor positive status [ER+]: Secondary | ICD-10-CM

## 2013-07-06 LAB — COMPREHENSIVE METABOLIC PANEL (CC13)
ALT: 11 U/L (ref 0–55)
ANION GAP: 7 meq/L (ref 3–11)
AST: 14 U/L (ref 5–34)
Albumin: 3.7 g/dL (ref 3.5–5.0)
Alkaline Phosphatase: 81 U/L (ref 40–150)
BUN: 10.3 mg/dL (ref 7.0–26.0)
CALCIUM: 10.6 mg/dL — AB (ref 8.4–10.4)
CO2: 27 meq/L (ref 22–29)
Chloride: 106 mEq/L (ref 98–109)
Creatinine: 0.9 mg/dL (ref 0.6–1.1)
Glucose: 91 mg/dl (ref 70–140)
POTASSIUM: 3.9 meq/L (ref 3.5–5.1)
SODIUM: 141 meq/L (ref 136–145)
TOTAL PROTEIN: 7.6 g/dL (ref 6.4–8.3)
Total Bilirubin: 0.23 mg/dL (ref 0.20–1.20)

## 2013-07-06 LAB — CBC WITH DIFFERENTIAL/PLATELET
BASO%: 0.2 % (ref 0.0–2.0)
Basophils Absolute: 0 10*3/uL (ref 0.0–0.1)
EOS%: 4.8 % (ref 0.0–7.0)
Eosinophils Absolute: 0.3 10*3/uL (ref 0.0–0.5)
HEMATOCRIT: 37.2 % (ref 34.8–46.6)
HGB: 11.9 g/dL (ref 11.6–15.9)
LYMPH#: 2.3 10*3/uL (ref 0.9–3.3)
LYMPH%: 41.4 % (ref 14.0–49.7)
MCH: 27.5 pg (ref 25.1–34.0)
MCHC: 31.9 g/dL (ref 31.5–36.0)
MCV: 86.1 fL (ref 79.5–101.0)
MONO#: 0.3 10*3/uL (ref 0.1–0.9)
MONO%: 5.8 % (ref 0.0–14.0)
NEUT#: 2.6 10*3/uL (ref 1.5–6.5)
NEUT%: 47.8 % (ref 38.4–76.8)
Platelets: 302 10*3/uL (ref 145–400)
RBC: 4.32 10*6/uL (ref 3.70–5.45)
RDW: 14.1 % (ref 11.2–14.5)
WBC: 5.5 10*3/uL (ref 3.9–10.3)

## 2013-07-06 MED ORDER — ONDANSETRON HCL 8 MG PO TABS: ORAL_TABLET | ORAL | Status: DC

## 2013-07-06 MED ORDER — LORAZEPAM 0.5 MG PO TABS: 0.5000 mg | ORAL_TABLET | Freq: Every evening | ORAL | Status: DC | PRN

## 2013-07-06 MED ORDER — LIDOCAINE-PRILOCAINE 2.5-2.5 % EX CREA: 1.0000 "application " | TOPICAL_CREAM | CUTANEOUS | Status: DC | PRN

## 2013-07-06 MED ORDER — DEXAMETHASONE 4 MG PO TABS: ORAL_TABLET | ORAL | Status: DC

## 2013-07-06 MED ORDER — TRAMADOL HCL 50 MG PO TABS: 50.0000 mg | ORAL_TABLET | Freq: Four times a day (QID) | ORAL | Status: DC | PRN

## 2013-07-06 MED ORDER — PROCHLORPERAZINE MALEATE 10 MG PO TABS: ORAL_TABLET | ORAL | Status: DC

## 2013-07-06 NOTE — Progress Notes (Signed)
[place]  [phone] [phone]     ID: [name] OB: 01-Aug-1975  MR#: [id number]  WPY#:[id number]  PCP: Colvin Caroli GYN:   SU: Alphonsa Overall OTHER MD: Lonia Chimera [phone])  CHIEF COMPLAINT:  Hx of Left Breast Cancer with Left Axillary Recurrence/Receiving Chemotherapy   HISTORY OF PRESENT ILLNESS: [name] had bilateral diagnostic mammography [facility] in [place] 11/30/2009 showing a 1.5 cm mass at the 9:00 position of the left breast with some satellites. A biopsy of the mass in question [place] [facility] showed ([id number]) and invasive lobular carcinoma, which was estrogen and progesterone receptor both 100% positive, both with strong staining intensity, but HER-2 negative at 1+. Bilateral breast MRI 12/13/2009 in [place] showed in the left breast an irregularly marginated mass measuring 3.2 cm. There were 4 or 5 separate satellite lesions laterally and superior to the primary.  Accordingly, after appropriate discussion, the patient underwent bilateral mastectomies with left sentinel lymph node sampling 01/02/2010. The right breast was benign. The left breast showed a 2.3 cm invasive lobular carcinoma, grade 3, with a total of 6 sentinel lymph nodes removed, all negative, although 2 showed isolated tumor cells by immunostaining. Margins were negative.  An Oncotype BX was sent, with a recurrence score of 22, predicting a risk of distant recurrence within 10 years with 14% of the patients only systemic treatment was tamoxifen. The patient was treated with CMF chemotherapy between 02/22/2010 and 07/05/2010, receiving 7 cycles at which point the patient refused further chemotherapy though there have been no major toxicities at according to the oncology note from Dr. Brent General. The patient was then started on tamoxifen 08/09/2010. By her cancer took approximately 12-15 months then stopped  because of hot flashes and insomnia problems.  More recently, the patient noted a change in her left axilla andunderwent bilateral breast MRI January to 2015 at [place]. The patient has bilateral submuscular silicone implants in place. The breast were unremarkable, but there were several lymph nodes with cortical thickening in the left axilla including a dominant 1 measuring 1.2 cm in the short axis. Ultrasonography of this area identified the lymph node in question and the patient underwent biopsy of the left axillary lymph node 05/05/2013. This showed ([id number]) near-total replacement of the lymph node by metastatic carcinoma. This was 96% estrogen receptor positive, and 84% progesterone receptor positive, both with strong staining intensity. The MIB-1 was 20%. HER-2 determination is pending.  The patient's subsequent history is as detailed below  INTERVAL HISTORY: [name] returns alone today for followup of her recurrent breast cancer. Since her last appointment here, she had a port placed by Dr. Lucia Gaskins with no complications. She has attended chemotherapy class and is ready to initiate her chemotherapy as scheduled next Monday, March 16. The plan is to treat with 6 q. three-week doses of docetaxel/carboplatin, and she is here today to review her upcoming regimen, review side effects, and discuss her and nausea regimen.   Overall, [name] is doing well. She is having some difficulty sleeping. Her biggest complaint today, however, is some significant pain in the left axilla, radiating into the shoulder and the left upper back. She tells me the pain averages between an 8 or 9 on a scale of 1-10. She is taking hydrocodone/APAP, but tries to take no more than 2 tablets daily because it makes her sleepy. She's having a difficult time keeping that pain under control.   REVIEW OF SYSTEMS: [name] denies  any recent fevers, chills, or night sweats. Her energy level is fairly good, although the pain  is limiting some of her activities. She's had no skin changes or rashes and denies any abnormal bleeding. She's eating and drinking well and currently denies any nausea or recent change in bowel or bladder habits. She's had no new cough, increased shortness of breath, chest pain, palpitations, or peripheral swelling. She has had no abnormal headaches, dizziness, or change in vision. She currently denies any unusual myalgias, arthralgias, or bony pain. At baseline, she also denies any signs of peripheral neuropathy in either the upper or lower extremities.  A detailed review of systems is otherwise stable and noncontributory.    PAST MEDICAL HISTORY: Past Medical History  Diagnosis Date  . Cancer     breast ca    PAST SURGICAL HISTORY: Past Surgical History  Procedure Laterality Date  . Bilateral total mastectomy with axillary lymph node dissection    . Tonsillectomy    . Leep    . Portacath placement N/A 06/16/2013    Procedure: POWER PORT PLACEMENT;  Surgeon: Shann Medal, MD;  Location: [facility];  Service: General;  Laterality: N/A;  . Axillary lymph node dissection Left 06/16/2013    Procedure: LEFT AXILLARY NODE DISSECTION;  Surgeon: Shann Medal, MD;  Location: [facility];  Service: General;  Laterality: Left;    FAMILY HISTORY Family History  Problem Relation Age of Onset  . Other Mother     glioblastoma  . Other Father     glioblastoma   according to the patient both her parents died from a primary brain cancers, namely we have the stomas, her father at 35, her mother at 64. The patient had one brother and one sister. There is no history of breast or ovarian cancer in the family  GYNECOLOGIC HISTORY:  Menarche age 66. The patient was never carried a child to term. She is still having regular periods, most recently second week in January. 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 She shares a home with her significant other, Burnice Logan, who works at installing cisterns. They have no pets.    ADVANCED DIRECTIVES: Not in place   HEALTH MAINTENANCE:  (Updated 07/06/2013) History  Substance Use Topics  . Smoking status: Never Smoker   . Smokeless tobacco: Never Used  . Alcohol Use: No     Colonoscopy: Never  PAP: December 2014  Bone density: Never  Lipid panel: Not on file   Allergies  Allergen Reactions  . Sulfa Antibiotics Swelling  . Gadolinium Derivatives Hives    After gado injection, pt had a few small hives on left breast area. Treated with benadryl by dr Janeece Fitting   . Petrolatum Rash    Current Outpatient Prescriptions  Medication Sig Dispense Refill  . HYDROcodone-acetaminophen (NORCO/VICODIN) 5-325 MG per tablet Take 1-2 tablets by mouth every 6 (six) hours as needed.  30 tablet  0  . dexamethasone (DECADRON) 4 MG tablet 2 tabs by mouth with food twice daily on day before and 3 days after each chemo  45 tablet  1  . lidocaine-prilocaine (EMLA) cream Apply 1 application topically as needed. 1-2 hours before each port access  30 g  2  . LORazepam (ATIVAN) 0.5 MG tablet Take 1 tablet (0.5 mg total) by mouth at bedtime as needed for anxiety  or sleep (or nausea).  30 tablet  0  . ondansetron (ZOFRAN) 8 MG tablet 1 tab by mouth twice daily x 3 days after each chemo, then 1 tab by mouth every 12 hrs if needed for nausea  30 tablet  1  . prochlorperazine (COMPAZINE) 10 MG tablet 1 tab by mouth with meals and bedtime x 3 days after chemo, then 1 tab by mouth every 6 hrs if needed for nausea  30 tablet  2  . traMADol (ULTRAM) 50 MG tablet Take 1 tablet (50 mg total) by mouth every 6 (six) hours as needed for moderate pain.  60 tablet  0   No current facility-administered medications for this visit.    OBJECTIVE: Young [place] American woman who appears slightly uncomfortable, but is in no acute  distress Filed Vitals:   07/06/13 1210  BP: 135/89  Pulse: 96  Temp: 98.2 F (36.8 C)  Resp: 20     Body mass index is 32.54 kg/(m^2).    ECOG FS:1 - Symptomatic but completely ambulatory Filed Weights   07/06/13 1210  Weight: 207 lb 12.8 oz (94.257 kg)   Physical Exam: HEENT:  Sclerae anicteric.  Oropharynx clear, pink, and moist. Neck supple, trachea midline. No thyromegaly.  NODES:  No cervical or supraclavicular lymphadenopathy palpated.  BREAST EXAM:  Patient status post bilateral mastectomies with implant reconstruction. There no suspicious nodularities or skin changes, no evidence of local recurrence. The lateral portion of the left breast and left axilla is extremely sensitive to palpation. The incision in the left axilla is healing well, and there is no swelling or erythema. No evidence of infection. Axillae are otherwise benign with no palpable lymphadenopathy. LUNGS:  Clear to auscultation bilaterally.  No wheezes or rhonchi HEART:  Regular rate and rhythm. No murmur appreciated ABDOMEN:  Soft, nontender.  Positive bowel sounds.  MSK:  No focal spinal tenderness to palpation. Limited range of motion in the left upper extremity secondary to pain. EXTREMITIES:  No peripheral edema.  No upper Chairman the lymphedema. SKIN:  Benign with no rashes or skin lesions. No nail dyscrasia. No excessive ecchymoses or petechiae. Port is intact in the right upper chest wall with well-healed incision. No erythema or edema, and no evidence of infection/cellulitis. NEURO:  Nonfocal. Well oriented.  Appropriate affect.    LAB RESULTS:   Lab Results  Component Value Date   WBC 5.5 07/06/2013   NEUTROABS 2.6 07/06/2013   HGB 11.9 07/06/2013   HCT 37.2 07/06/2013   MCV 86.1 07/06/2013   PLT 302 07/06/2013      Chemistry      Component Value Date/Time   NA 141 07/06/2013 1142   K 3.9 07/06/2013 1142   CO2 27 07/06/2013 1142   BUN 10.3 07/06/2013 1142   CREATININE 0.9 07/06/2013 1142       Component Value Date/Time   CALCIUM 10.6* 07/06/2013 1142   ALKPHOS 81 07/06/2013 1142   AST 14 07/06/2013 1142   ALT 11 07/06/2013 1142   BILITOT 0.23 07/06/2013 1142       STUDIES: Ct Chest W Contrast 05/27/2013   CLINICAL DATA:  Stage III breast cancer, status post bilateral mastectomy with reconstruction, chemotherapy complete  EXAM: CT CHEST WITH CONTRAST  TECHNIQUE: Multidetector CT imaging of the chest was performed during intravenous contrast administration.  CONTRAST:  10m OMNIPAQUE IOHEXOL 300 MG/ML  SOLN  COMPARISON:  Concurrent PET-CT dated 05/27/2013  FINDINGS: Lungs are clear. No suspicious pulmonary nodules. No pleural effusion  or pneumothorax.  Visualized thyroid is unremarkable.  The heart is normal in size.  No pericardial effusion.  No suspicious mediastinal, hilar, or right axillary lymphadenopathy. Multiple enlarged left axillary nodes measuring up to 1.6 cm short axis (series 2/ image 16), suspicious for nodal metastases. Additional left subpectoral nodes measuring up to 5-6 mm short axis (series 2/image 9), non FDG avid. 10 mm short axis left supraclavicular node (series 2/image 2), hypermetabolic on prior PET, suspicious for nodal metastasis.  Status post bilateral mastectomy with reconstruction. Left axillary lymph node dissection.  Visualized upper abdomen is unremarkable.  Old/healed upper sternal deformity (sagittal image 22). Visualized osseous structures are otherwise within normal limits.  IMPRESSION: Status post bilateral mastectomy with reconstruction and left axillary lymph node dissection.  Suspected left supraclavicular and left axillary nodal metastases, as described above.  Otherwise, no evidence of metastatic disease in the chest.   Electronically Signed   By: Julian Hy M.D.   On: 05/27/2013 15:24   Nm Pet Image Initial (pi) Skull Base To Thigh 05/27/2013   CLINICAL DATA:  Initial treatment strategy for breast cancer.  EXAM: NUCLEAR MEDICINE PET SKULL BASE TO  THIGH  FASTING BLOOD GLUCOSE:  Value: 60m/dl  TECHNIQUE: 15.3 mCi F-18 FDG was injected intravenously. CT data was obtained and used for attenuation correction and anatomic localization only. (This was not acquired as a diagnostic CT examination.) Additional exam technical data entered on technologist worksheet.  COMPARISON:  None.  FINDINGS: NECK  Enlarged left supraclavicular lymph node exhibits malignant range FDG uptake. This measures 1 cm, image 45/ series 2, and has an SUV max equal to 5.3.  CHEST  Multiple enlarged and hypermetabolic left axillary lymph nodes are identified. Index left axillary lymph node measures 1.5 cm, image 65/series 2. The SUV max associated with this lymph node is equal to 7.3. Bilateral breast implants stress set bilateral subpectoral breast implants are identified. No right supraclavicular adenopathy identified no hypermetabolic or enlarged right axillary lymph nodes.  The trachea appears patent and is midline. There is mild cardiac enlargement. No pericardial effusion. Multiple small lymph nodes are identified within the anterior mediastinum. No hypermetabolic at now adenopathy no hypermetabolic pulmonary nodules or mass is identified.  ABDOMEN/PELVIS  No abnormal hypermetabolic activity within the liver, pancreas, adrenal glands, or spleen. No hypermetabolic lymph nodes in the abdomen or pelvis.  SKELETON  No focal hypermetabolic activity to suggest skeletal metastasis. Previous ORIF of right femur.  IMPRESSION: 1. Examination is positive for hypermetabolic left axillary and left supraclavicular lymph node metastasis. 2. No evidence for distant metastatic disease.   Electronically Signed   By: TKerby MoorsM.D.   On: 05/27/2013 14:16   Dg Chest Port 1 View 06/16/2013   CLINICAL DATA:  Status post port placement  EXAM: PORTABLE CHEST - 1 VIEW  COMPARISON:  None.  FINDINGS: The cardiac shadow is within normal limits. A right-sided chest wall port is seen. The catheter tip is  noted in the mid superior vena cava. No pneumothorax is noted. The lungs are clear. Postsurgical changes are noted in the left axilla.  IMPRESSION: No pneumothorax following port placement.   Electronically Signed   By: MInez CatalinaM.D.   On: 06/16/2013 10:36     ASSESSMENT: 38y.o. [place] woman  (1) status post bilateral mastectomies with left axillary lymph node sampling [6 nodes removed] 01/02/2010 for a pT2 pN0(i+), stage IIA invasive lobular breast cancer, grade 3, estrogen and progesterone receptor positive, HER-2 not amplified; right  breast was benign  (2) Oncotype DX score of 22 predicted a 14% risk of distant recurrence within 10 years if the patient's only systemic treatment is tamoxifen for 5 years  (3) status post CMF(cyclophosphamide, fluorouracil, methotrexate) x7 given between October of 2011 and March of 2012 (7 of 8 planned treatments completed)  (4) tamoxifen started April 2012, discontinued approximately July of 2013 (about 15 months) because of problems with hot flashes and insomnia  (5) pathologically documented left axillary recurrence 05/05/2013, the tumor being again estrogen and progesterone receptor positive, with HER-2 not amplified, and MIB-1 of 15%. Staging CT/PET 05/27/2013 show left axillary and supraclavicular recurrence, no distant disease  (6) status post completion left axillary lymph node dissection 06/16/2013 showing a total of 10/15 lymph nodes involved by tumor, with evidence of extracapsular extension (TX N3 = stage IIIC)  (7) being treated with carboplatin/ docetaxel, first dose on 07/11/2013, to be repeated every 21 days x 6 with Neulasta support on day 2  (8) radiation to follow chemotherapy  (9) bilateral salpingo-oophorectomy later this year, followed by aromatase inhibitor therapy for at least 5 years   PLAN: The majority of our 60 minute appointment today was spent reviewing Nykeria is upcoming treatment regimen, reviewing possible side  effects and toxicities, discussing her antinausea regimen, answering her questions, and coordinating care.   Corah had quite a bit of nausea and vomiting when she received her chemotherapy several years ago, so we spent quite a bit of time discussing prevention today. She is given prescriptions for all of her antinausea medications which will include: Dexamethasone (8 mg by mouth twice daily with food on the day before and 3 days after each chemotherapy); prochlorperazine (10 mg by mouth with meals and at bedtime x3 days following each chemotherapy, then one tablet by mouth every 6 hours if needed for nausea); ondansetron (8 mg by mouth twice daily x3 days after chemotherapy, then one tablet by mouth every 12 hours if needed for nausea) and lorazepam (0.5 mg at bedtime as needed for anxiety/insomnia/nausea). We also discussed the use of Claritin 10 mg daily for 5-7 days following the Neulasta injection to decrease bony pain. She will take Aleve, one tablet twice daily with food, for several days after the Neulasta injection as well. She was also given a prescription for her EMLA cream to apply to port 1-2 hours before access, and was given instructions on how to do so. She was given written instructions of all of the above, and voiced her understanding of how to utilize all of these medications appropriately.  With regards to her pain in the left axilla, I think this is going to be postsurgical in origin. Because I do not want to mask any neuropathy, I am somewhat hesitant to start her on gabapentin currently. She tolerates the hydrocodone/APAP well with the exception of sleepiness. We're going to try tramadol instead, 50 mg up to every 6 hours as needed for pain. She may choose to utilize the tramadol during the day, then hydrocodone/APAP at night. Of course she can also utilize these medications if needed for any bone pain following the Neulasta injection.  She is scheduled to return on Monday, March 16,  for her first of 6 planned q. three-week doses of docetaxel/carboplatin. She will have Neulasta on day 2. I will plan on seeing her the following week on March 25 for assessment of chemotoxicity.   Hayla's work is very physical and she would not be expected to be able to  work while receiving aggressive chemotherapy. We have recommended that she apply for temporary disability. She will bring [place] the appropriate papers.  Burkley has a good understanding of the overall plan. She is in agreement with it. She knows the goal of treatment in her case is cure. She will call with any problems that may develop before her next visit here.   Lyfe Reihl, PA-C   07/06/2013 1:51 PM

## 2013-07-06 NOTE — Telephone Encounter (Signed)
appts made and printed...td 

## 2013-07-07 ENCOUNTER — Other Ambulatory Visit: Payer: Self-pay | Admitting: Physician Assistant

## 2013-07-07 ENCOUNTER — Other Ambulatory Visit: Payer: Self-pay | Admitting: Oncology

## 2013-07-08 ENCOUNTER — Encounter: Payer: Self-pay | Admitting: Oncology

## 2013-07-08 ENCOUNTER — Encounter: Payer: 59 | Admitting: Oncology

## 2013-07-08 ENCOUNTER — Other Ambulatory Visit: Payer: 59

## 2013-07-08 NOTE — Progress Notes (Signed)
Faxed disability paper to [facility] @ [phone]

## 2013-07-11 ENCOUNTER — Ambulatory Visit (HOSPITAL_BASED_OUTPATIENT_CLINIC_OR_DEPARTMENT_OTHER): Payer: 59

## 2013-07-11 VITALS — BP 141/94 | HR 74 | Temp 97.4°F | Resp 16

## 2013-07-11 DIAGNOSIS — C50919 Malignant neoplasm of unspecified site of unspecified female breast: Secondary | ICD-10-CM

## 2013-07-11 DIAGNOSIS — Z5111 Encounter for antineoplastic chemotherapy: Secondary | ICD-10-CM

## 2013-07-11 DIAGNOSIS — C773 Secondary and unspecified malignant neoplasm of axilla and upper limb lymph nodes: Secondary | ICD-10-CM

## 2013-07-11 MED ORDER — DEXAMETHASONE SODIUM PHOSPHATE 20 MG/5ML IJ SOLN
INTRAMUSCULAR | Status: AC
  Filled 2013-07-11: qty 5

## 2013-07-11 MED ORDER — SODIUM CHLORIDE 0.9 % IV SOLN
Freq: Once | INTRAVENOUS | Status: AC
  Administered 2013-07-11: 11:00:00 via INTRAVENOUS

## 2013-07-11 MED ORDER — ONDANSETRON 16 MG/50ML IVPB (CHCC)
INTRAVENOUS | Status: AC
  Filled 2013-07-11: qty 16

## 2013-07-11 MED ORDER — SODIUM CHLORIDE 0.9 % IJ SOLN
10.0000 mL | INTRAMUSCULAR | Status: DC | PRN
  Administered 2013-07-11: 10 mL
  Filled 2013-07-11: qty 10

## 2013-07-11 MED ORDER — ONDANSETRON 16 MG/50ML IVPB (CHCC)
16.0000 mg | Freq: Once | INTRAVENOUS | Status: AC
  Administered 2013-07-11: 16 mg via INTRAVENOUS

## 2013-07-11 MED ORDER — HEPARIN SOD (PORK) LOCK FLUSH 100 UNIT/ML IV SOLN
500.0000 [IU] | Freq: Once | INTRAVENOUS | Status: AC | PRN
  Administered 2013-07-11: 500 [IU]
  Filled 2013-07-11: qty 5

## 2013-07-11 MED ORDER — DEXAMETHASONE SODIUM PHOSPHATE 20 MG/5ML IJ SOLN
20.0000 mg | Freq: Once | INTRAMUSCULAR | Status: AC
  Administered 2013-07-11: 20 mg via INTRAVENOUS

## 2013-07-11 MED ORDER — SODIUM CHLORIDE 0.9 % IV SOLN
881.4000 mg | Freq: Once | INTRAVENOUS | Status: AC
  Administered 2013-07-11: 880 mg via INTRAVENOUS
  Filled 2013-07-11: qty 88

## 2013-07-11 MED ORDER — SODIUM CHLORIDE 0.9 % IV SOLN
75.0000 mg/m2 | Freq: Once | INTRAVENOUS | Status: AC
  Administered 2013-07-11: 160 mg via INTRAVENOUS
  Filled 2013-07-11: qty 16

## 2013-07-11 NOTE — Progress Notes (Signed)
Taxotere will be started at this time, VSS.

## 2013-07-11 NOTE — Progress Notes (Signed)
Taxotere rate increased to goal rate of 266/hr at this time

## 2013-07-11 NOTE — Progress Notes (Signed)
VSS, taxotere started at 1207 at 1/4 rate.  Will increase to 1/2 rate

## 2013-07-11 NOTE — Patient Instructions (Addendum)
[facility] Discharge Instructions for Patients Receiving Chemotherapy  Today you received the following chemotherapy agents Taxotere, carboplatin  To help prevent nausea and vomiting after your treatment, we encourage you to take your nausea medication Other zofran 8mg , compazine 10 gm, lorazepam 0.5 mg as ordered by Dr. Jana Hakim.  Also take dexamethasome as ordered.   If you develop nausea and vomiting that is not controlled by your nausea medication, call the clinic.   BELOW ARE SYMPTOMS THAT SHOULD BE REPORTED IMMEDIATELY:  *FEVER GREATER THAN 100.5 F  *CHILLS WITH OR WITHOUT FEVER  NAUSEA AND VOMITING THAT IS NOT CONTROLLED WITH YOUR NAUSEA MEDICATION  *UNUSUAL SHORTNESS OF BREATH  *UNUSUAL BRUISING OR BLEEDING  TENDERNESS IN MOUTH AND THROAT WITH OR WITHOUT PRESENCE OF ULCERS  *URINARY PROBLEMS  *BOWEL PROBLEMS  UNUSUAL RASH Items with * indicate a potential emergency and should be followed up as soon as possible.  Feel free to call the clinic should you have any questions or concerns. The clinic phone number is [phone].

## 2013-07-11 NOTE — Progress Notes (Signed)
Discharged at 1500, ambulatory in no distress.

## 2013-07-11 NOTE — Progress Notes (Signed)
Taxotere increased to 3/4 rate at 1237

## 2013-07-11 NOTE — Progress Notes (Signed)
Tolerating taxotere at goal rate well.

## 2013-07-12 ENCOUNTER — Encounter: Payer: Self-pay | Admitting: Physician Assistant

## 2013-07-12 ENCOUNTER — Telehealth: Payer: Self-pay | Admitting: *Deleted

## 2013-07-12 ENCOUNTER — Other Ambulatory Visit: Payer: Self-pay | Admitting: Physician Assistant

## 2013-07-12 ENCOUNTER — Ambulatory Visit (HOSPITAL_BASED_OUTPATIENT_CLINIC_OR_DEPARTMENT_OTHER): Payer: 59

## 2013-07-12 VITALS — BP 136/91 | HR 69 | Temp 98.6°F

## 2013-07-12 DIAGNOSIS — Z5189 Encounter for other specified aftercare: Secondary | ICD-10-CM

## 2013-07-12 DIAGNOSIS — C50919 Malignant neoplasm of unspecified site of unspecified female breast: Secondary | ICD-10-CM

## 2013-07-12 DIAGNOSIS — C773 Secondary and unspecified malignant neoplasm of axilla and upper limb lymph nodes: Secondary | ICD-10-CM

## 2013-07-12 DIAGNOSIS — R252 Cramp and spasm: Secondary | ICD-10-CM

## 2013-07-12 MED ORDER — METAXALONE 800 MG PO TABS: 800.0000 mg | ORAL_TABLET | Freq: Three times a day (TID) | ORAL | Status: DC | PRN

## 2013-07-12 MED ORDER — PEGFILGRASTIM INJECTION 6 MG/0.6ML
6.0000 mg | Freq: Once | SUBCUTANEOUS | Status: AC
  Administered 2013-07-12: 6 mg via SUBCUTANEOUS
  Filled 2013-07-12: qty 0.6

## 2013-07-12 NOTE — Telephone Encounter (Signed)
VERBAL ORDER AND READ BACK TO AMY BERRY,PA- NOTIFY PT. THAT A PRESCRIPTION FOR BACK SPASMS HAS BEEN SENT TO HER PHARMACY, [facility]. CALLED PT. AND LEFT A MESSAGE ON HER HOME VOICE MAIL CONCERNING THE ABOVE INSTRUCTIONS. ALSO ASKED PT. TO CALL THIS OFFICE TOMORROW TO VERIFY SHE RECEIVED THIS MESSAGE AND GIVE AN UPDATE CONCERNING THE BACK SPASMS.

## 2013-07-12 NOTE — Telephone Encounter (Signed)
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  States that she is doing well.  No nausea, vomiting or diarrhea.  Is drinking lots of fluids and eating small amounts.  All questions answered.  Knows to call if she has any problems or concerns.

## 2013-07-14 ENCOUNTER — Encounter: Payer: Self-pay | Admitting: Physician Assistant

## 2013-07-15 ENCOUNTER — Telehealth: Payer: Self-pay | Admitting: *Deleted

## 2013-07-15 MED ORDER — DICYCLOMINE HCL 10 MG PO CAPS: 10.0000 mg | ORAL_CAPSULE | Freq: Three times a day (TID) | ORAL | Status: DC

## 2013-07-15 NOTE — Telephone Encounter (Signed)
Post review with AB/PA obtained recommendations- to stop apple juice.  Use compazine at least 3 times a day and prescription for Bentyl sent to pharmacy for bowel cramps.  All the above discussed with pt who verbalized understanding.

## 2013-07-15 NOTE — Telephone Encounter (Signed)
Pt called to this RN to report unrelieved symptoms post chemotherapy on Monday 3/16.  [name] states she is having mid abd " cramping ", frequent loose stools " not diarrhea but just soft ", decreased appetite and nausea.  Per further inquiry [name] states she is hydrating well with " water and a lot of apple juice ".  She " feels hungry but then when I start eating, I get the abd pain and feel nauseated ".  [name] states she feels general nausea " but not like I need to vomit ".  The above will be reviewed with AB/PA for appropriate recommendations  Return call to pt is [phone].

## 2013-07-16 ENCOUNTER — Emergency Department (HOSPITAL_COMMUNITY)
Admission: EM | Admit: 2013-07-16 | Discharge: 2013-07-16 | Disposition: A | Discharge status: Home / Self Care | Payer: 59 | Attending: Emergency Medicine | Admitting: Emergency Medicine

## 2013-07-16 ENCOUNTER — Encounter (HOSPITAL_COMMUNITY): Payer: Self-pay | Admitting: Emergency Medicine

## 2013-07-16 DIAGNOSIS — R112 Nausea with vomiting, unspecified: Secondary | ICD-10-CM | POA: Insufficient documentation

## 2013-07-16 DIAGNOSIS — R110 Nausea: Secondary | ICD-10-CM

## 2013-07-16 DIAGNOSIS — R197 Diarrhea, unspecified: Secondary | ICD-10-CM | POA: Insufficient documentation

## 2013-07-16 DIAGNOSIS — C773 Secondary and unspecified malignant neoplasm of axilla and upper limb lymph nodes: Secondary | ICD-10-CM | POA: Insufficient documentation

## 2013-07-16 DIAGNOSIS — C50919 Malignant neoplasm of unspecified site of unspecified female breast: Secondary | ICD-10-CM

## 2013-07-16 DIAGNOSIS — Z79899 Other long term (current) drug therapy: Secondary | ICD-10-CM | POA: Insufficient documentation

## 2013-07-16 DIAGNOSIS — R1013 Epigastric pain: Secondary | ICD-10-CM | POA: Insufficient documentation

## 2013-07-16 DIAGNOSIS — R000 Tachycardia, unspecified: Secondary | ICD-10-CM | POA: Insufficient documentation

## 2013-07-16 LAB — COMPREHENSIVE METABOLIC PANEL
ALT: 14 U/L (ref 0–35)
AST: 16 U/L (ref 0–37)
Albumin: 4 g/dL (ref 3.5–5.2)
Alkaline Phosphatase: 92 U/L (ref 39–117)
BILIRUBIN TOTAL: 0.9 mg/dL (ref 0.3–1.2)
BUN: 11 mg/dL (ref 6–23)
CO2: 25 meq/L (ref 19–32)
CREATININE: 0.81 mg/dL (ref 0.50–1.10)
Calcium: 10.1 mg/dL (ref 8.4–10.5)
Chloride: 97 mEq/L (ref 96–112)
GFR calc Af Amer: >90 mL/min (ref >90)
GFR, EST NON AFRICAN AMERICAN: 90 mL/min — AB (ref >90)
GLUCOSE: 113 mg/dL — AB (ref 70–99)
Potassium: 4.4 mEq/L (ref 3.7–5.3)
Sodium: 135 mEq/L — ABNORMAL LOW (ref 137–147)
Total Protein: 8.4 g/dL — ABNORMAL HIGH (ref 6.0–8.3)

## 2013-07-16 LAB — URINALYSIS, ROUTINE W REFLEX MICROSCOPIC
Bilirubin Urine: NEGATIVE
GLUCOSE, UA: NEGATIVE mg/dL
HGB URINE DIPSTICK: NEGATIVE
Ketones, ur: NEGATIVE mg/dL
LEUKOCYTES UA: NEGATIVE
Nitrite: NEGATIVE
PROTEIN: 30 mg/dL — AB
Specific Gravity, Urine: 1.025 (ref 1.005–1.030)
Urobilinogen, UA: 0.2 mg/dL (ref 0.0–1.0)
pH: 6 (ref 5.0–8.0)

## 2013-07-16 LAB — URINE MICROSCOPIC-ADD ON

## 2013-07-16 LAB — CBC WITH DIFFERENTIAL/PLATELET
BASOS PCT: 0 % (ref 0–1)
Basophils Absolute: 0 10*3/uL (ref 0.0–0.1)
EOS PCT: 4 % (ref 0–5)
Eosinophils Absolute: 0.1 10*3/uL (ref 0.0–0.7)
HEMATOCRIT: 39.6 % (ref 36.0–46.0)
Hemoglobin: 13.1 g/dL (ref 12.0–15.0)
Lymphocytes Relative: 34 % (ref 12–46)
Lymphs Abs: 1.2 10*3/uL (ref 0.7–4.0)
MCH: 28.2 pg (ref 26.0–34.0)
MCHC: 33.1 g/dL (ref 30.0–36.0)
MCV: 85.3 fL (ref 78.0–100.0)
MONO ABS: 0.1 10*3/uL (ref 0.1–1.0)
Monocytes Relative: 2 % — ABNORMAL LOW (ref 3–12)
NEUTROS ABS: 2.1 10*3/uL (ref 1.7–7.7)
Neutrophils Relative %: 60 % (ref 43–77)
Platelets: 195 10*3/uL (ref 150–400)
RBC: 4.64 MIL/uL (ref 3.87–5.11)
RDW: 13.4 % (ref 11.5–15.5)
WBC: 3.5 10*3/uL — ABNORMAL LOW (ref 4.0–10.5)

## 2013-07-16 LAB — LIPASE, BLOOD: Lipase: 18 U/L (ref 11–59)

## 2013-07-16 MED ORDER — MORPHINE SULFATE 4 MG/ML IJ SOLN
4.0000 mg | Freq: Once | INTRAMUSCULAR | Status: AC
Start: 2013-07-16 — End: 2013-07-16
  Administered 2013-07-16: 4 mg via INTRAVENOUS
  Filled 2013-07-16: qty 1

## 2013-07-16 MED ORDER — SODIUM CHLORIDE 0.9 % IJ SOLN
INTRAMUSCULAR | Status: AC
  Filled 2013-07-16: qty 10

## 2013-07-16 MED ORDER — PANTOPRAZOLE SODIUM 40 MG IV SOLR
40.0000 mg | Freq: Once | INTRAVENOUS | Status: AC
  Administered 2013-07-16: 40 mg via INTRAVENOUS
  Filled 2013-07-16: qty 40

## 2013-07-16 MED ORDER — PANTOPRAZOLE SODIUM 40 MG PO TBEC: 40.0000 mg | DELAYED_RELEASE_TABLET | Freq: Every day | ORAL | Status: DC

## 2013-07-16 MED ORDER — ONDANSETRON HCL 4 MG/2ML IJ SOLN
4.0000 mg | Freq: Once | INTRAMUSCULAR | Status: AC
  Administered 2013-07-16: 4 mg via INTRAVENOUS
  Filled 2013-07-16: qty 2

## 2013-07-16 MED ORDER — ONDANSETRON HCL 4 MG PO TABS: 4.0000 mg | ORAL_TABLET | Freq: Four times a day (QID) | ORAL | Status: DC | PRN

## 2013-07-16 MED ORDER — SODIUM CHLORIDE 0.9 % IV SOLN
1000.0000 mL | Freq: Once | INTRAVENOUS | Status: AC
  Administered 2013-07-16: 1000 mL via INTRAVENOUS

## 2013-07-16 MED ORDER — SODIUM CHLORIDE 0.9 % IV SOLN
1000.0000 mL | INTRAVENOUS | Status: DC
  Administered 2013-07-16: 1000 mL via INTRAVENOUS

## 2013-07-16 NOTE — Discharge Instructions (Signed)
Abdominal Pain, Adult Many things can cause abdominal pain. Usually, abdominal pain is not caused by a disease and will improve without treatment. It can often be observed and treated at home. Your health care provider will do a physical exam and possibly order blood tests and X-rays to help determine the seriousness of your pain. However, in many cases, more time must pass before a clear cause of the pain can be found. Before that point, your health care provider may not know if you need more testing or further treatment. HOME CARE INSTRUCTIONS  Monitor your abdominal pain for any changes. The following actions may help to alleviate any discomfort you are experiencing:  Only take over-the-counter or prescription medicines as directed by your health care provider.  Do not take laxatives unless directed to do so by your health care provider.  Try a clear liquid diet (broth, tea, or water) as directed by your health care provider. Slowly move to a bland diet as tolerated. SEEK MEDICAL CARE IF:  You have unexplained abdominal pain.  You have abdominal pain associated with nausea or diarrhea.  You have pain when you urinate or have a bowel movement.  You experience abdominal pain that wakes you in the night.  You have abdominal pain that is worsened or improved by eating food.  You have abdominal pain that is worsened with eating fatty foods. SEEK IMMEDIATE MEDICAL CARE IF:   Your pain does not go away within 2 hours.  You have a fever.  You keep throwing up (vomiting).  Your pain is felt only in portions of the abdomen, such as the right side or the left lower portion of the abdomen.  You pass bloody or black tarry stools. MAKE SURE YOU:  Understand these instructions.   Will watch your condition.   Will get help right away if you are not doing well or get worse.  Document Released: 01/22/2005 Document Revised: 02/02/2013 Document Reviewed: 12/22/2012 [facility] Patient  Information 2014 [place].  Nausea and Vomiting Nausea is a sick feeling that often comes before throwing up (vomiting). Vomiting is a reflex where stomach contents come out of your mouth. Vomiting can cause severe loss of body fluids (dehydration). Children and elderly adults can become dehydrated quickly, especially if they also have diarrhea. Nausea and vomiting are symptoms of a condition or disease. It is important to find the cause of your symptoms. CAUSES   Direct irritation of the stomach lining. This irritation can result from increased acid production (gastroesophageal reflux disease), infection, food poisoning, taking certain medicines (such as nonsteroidal anti-inflammatory drugs), alcohol use, or tobacco use.  Signals from the brain.These signals could be caused by a headache, heat exposure, an inner ear disturbance, increased pressure in the brain from injury, infection, a tumor, or a concussion, pain, emotional stimulus, or metabolic problems.  An obstruction in the gastrointestinal tract (bowel obstruction).  Illnesses such as diabetes, hepatitis, gallbladder problems, appendicitis, kidney problems, cancer, sepsis, atypical symptoms of a heart attack, or eating disorders.  Medical treatments such as chemotherapy and radiation.  Receiving medicine that makes you sleep (general anesthetic) during surgery. DIAGNOSIS Your caregiver may ask for tests to be done if the problems do not improve after a few days. Tests may also be done if symptoms are severe or if the reason for the nausea and vomiting is not clear. Tests may include:  Urine tests.  Blood tests.  Stool tests.  Cultures (to look for evidence of infection).  X-rays or  other imaging studies. Test results can help your caregiver make decisions about treatment or the need for additional tests. TREATMENT You need to stay well hydrated. Drink frequently but in small amounts.You may wish to drink water, sports  drinks, clear broth, or eat frozen ice pops or gelatin dessert to help stay hydrated.When you eat, eating slowly may help prevent nausea.There are also some antinausea medicines that may help prevent nausea. HOME CARE INSTRUCTIONS   Take all medicine as directed by your caregiver.  If you do not have an appetite, do not force yourself to eat. However, you must continue to drink fluids.  If you have an appetite, eat a normal diet unless your caregiver tells you differently.  Eat a variety of complex carbohydrates (rice, wheat, potatoes, bread), lean meats, yogurt, fruits, and vegetables.  Avoid high-fat foods because they are more difficult to digest.  Drink enough water and fluids to keep your urine clear or pale yellow.  If you are dehydrated, ask your caregiver for specific rehydration instructions. Signs of dehydration may include:  Severe thirst.  Dry lips and mouth.  Dizziness.  Dark urine.  Decreasing urine frequency and amount.  Confusion.  Rapid breathing or pulse. SEEK IMMEDIATE MEDICAL CARE IF:   You have blood or brown flecks (like coffee grounds) in your vomit.  You have black or bloody stools.  You have a severe headache or stiff neck.  You are confused.  You have severe abdominal pain.  You have chest pain or trouble breathing.  You do not urinate at least once every 8 hours.  You develop cold or clammy skin.  You continue to vomit for longer than 24 to 48 hours.  You have a fever. MAKE SURE YOU:   Understand these instructions.  Will watch your condition.  Will get help right away if you are not doing well or get worse. Document Released: 04/14/2005 Document Revised: 07/07/2011 Document Reviewed: 09/11/2010 [facility] Patient Information 2014 [place], [place].  Pantoprazole tablets What is this medicine? PANTOPRAZOLE (pan TOE pra zole) prevents the production of acid in the stomach. It is used to treat gastroesophageal reflux disease  (GERD), inflammation of the esophagus, and Zollinger-Ellison syndrome. This medicine may be used for other purposes; ask your health care provider or pharmacist if you have questions. COMMON BRAND NAME(S): Protonix What should I tell my health care provider before I take this medicine? They need to know if you have any of these conditions: -liver disease -low levels of magnesium in the blood -an unusual or allergic reaction to omeprazole, lansoprazole, pantoprazole, rabeprazole, other medicines, foods, dyes, or preservatives -pregnant or trying to get pregnant -breast-feeding How should I use this medicine? Take this medicine by mouth. Swallow the tablets whole with a drink of water. Follow the directions on the prescription label. Do not crush, break, or chew. Take your medicine at regular intervals. Do not take your medicine more often than directed. Talk to your pediatrician regarding the use of this medicine in children. While this drug may be prescribed for children as young as 5 years for selected conditions, precautions do apply. Overdosage: If you think you have taken too much of this medicine contact a poison control center or emergency room at once. NOTE: This medicine is only for you. Do not share this medicine with others. What if I miss a dose? If you miss a dose, take it as soon as you can. If it is almost time for your next dose, take only that dose.  Do not take double or extra doses. What may interact with this medicine? Do not take this medicine with any of the following medications: -atazanavir -nelfinavir This medicine may also interact with the following medications: -ampicillin -delavirdine -digoxin -diuretics -iron salts -medicines for fungal infections like ketoconazole, itraconazole and voriconazole -warfarin This list may not describe all possible interactions. Give your health care provider a list of all the medicines, herbs, non-prescription drugs, or dietary  supplements you use. Also tell them if you smoke, drink alcohol, or use illegal drugs. Some items may interact with your medicine. What should I watch for while using this medicine? It can take several days before your stomach pain gets better. Check with your doctor or health care professional if your condition does not start to get better, or if it gets worse. You may need blood work done while you are taking this medicine. What side effects may I notice from receiving this medicine? Side effects that you should report to your doctor or health care professional as soon as possible: -allergic reactions like skin rash, itching or hives, swelling of the face, lips, or tongue -bone, muscle or joint pain -breathing problems -chest pain or chest tightness -dark yellow or brown urine -dizziness -fast, irregular heartbeat -feeling faint or lightheaded -fever or sore throat -muscle spasm -palpitations -redness, blistering, peeling or loosening of the skin, including inside the mouth -seizures -tremors -unusual bleeding or bruising -unusually weak or tired -yellowing of the eyes or skin Side effects that usually do not require medical attention (Report these to your doctor or health care professional if they continue or are bothersome.): -constipation -diarrhea -dry mouth -headache -nausea This list may not describe all possible side effects. Call your doctor for medical advice about side effects. You may report side effects to FDA at 1-800-FDA-1088. Where should I keep my medicine? Keep out of the reach of children. Store at room temperature between 15 and 30 degrees C (59 and 86 degrees F). Protect from light and moisture. Throw away any unused medicine after the expiration date. NOTE: This sheet is a summary. It may not cover all possible information. If you have questions about this medicine, talk to your doctor, pharmacist, or health care provider.  2014, [employer] Standard.  (2012-02-11 16:40:16)  Ondansetron tablets What is this medicine? ONDANSETRON (on DAN se tron) is used to treat nausea and vomiting caused by chemotherapy. It is also used to prevent or treat nausea and vomiting after surgery. This medicine may be used for other purposes; ask your health care provider or pharmacist if you have questions. COMMON BRAND NAME(S): Zofran What should I tell my health care provider before I take this medicine? They need to know if you have any of these conditions: -heart disease -history of irregular heartbeat -liver disease -low levels of magnesium or potassium in the blood -an unusual or allergic reaction to ondansetron, granisetron, other medicines, foods, dyes, or preservatives -pregnant or trying to get pregnant -breast-feeding How should I use this medicine? Take this medicine by mouth with a glass of water. Follow the directions on your prescription label. Take your doses at regular intervals. Do not take your medicine more often than directed. Talk to your pediatrician regarding the use of this medicine in children. Special care may be needed. Overdosage: If you think you have taken too much of this medicine contact a poison control center or emergency room at once. NOTE: This medicine is only for you. Do not share this medicine with others.  What if I miss a dose? If you miss a dose, take it as soon as you can. If it is almost time for your next dose, take only that dose. Do not take double or extra doses. What may interact with this medicine? Do not take this medicine with any of the following medications: -apomorphine -certain medicines for fungal infections like fluconazole, itraconazole, ketoconazole, posaconazole, voriconazole -cisapride -dofetilide -dronedarone -pimozide -thioridazine -ziprasidone  This medicine may also interact with the following medications: -carbamazepine -certain medicines for depression, anxiety, or psychotic  disturbances -fentanyl -linezolid -MAOIs like Carbex, Eldepryl, Marplan, Nardil, and Parnate -methylene blue (injected into a vein) -other medicines that prolong the QT interval (cause an abnormal heart rhythm) -phenytoin -rifampicin -tramadol This list may not describe all possible interactions. Give your health care provider a list of all the medicines, herbs, non-prescription drugs, or dietary supplements you use. Also tell them if you smoke, drink alcohol, or use illegal drugs. Some items may interact with your medicine. What should I watch for while using this medicine? Check with your doctor or health care professional right away if you have any sign of an allergic reaction. What side effects may I notice from receiving this medicine? Side effects that you should report to your doctor or health care professional as soon as possible: -allergic reactions like skin rash, itching or hives, swelling of the face, lips or tongue -breathing problems -confusion -dizziness -fast or irregular heartbeat -feeling faint or lightheaded, falls -fever and chills -loss of balance or coordination -seizures -sweating -swelling of the hands or feet -tightness in the chest -tremors -unusually weak or tired Side effects that usually do not require medical attention (report to your doctor or health care professional if they continue or are bothersome): -constipation or diarrhea -headache This list may not describe all possible side effects. Call your doctor for medical advice about side effects. You may report side effects to FDA at 1-800-FDA-1088. Where should I keep my medicine? Keep out of the reach of children. Store between 2 and 30 degrees C (36 and 86 degrees F). Throw away any unused medicine after the expiration date. NOTE: This sheet is a summary. It may not cover all possible information. If you have questions about this medicine, talk to your doctor, pharmacist, or health care  provider.  2014, [employer]vier/Gold Standard. (2013-01-19 16:27:45)

## 2013-07-16 NOTE — ED Notes (Signed)
Pt began having chemo tx Monday for breast cancer, Thursday she began n/v/d, abd pain, diaphoresis, tachycardia when she is lying down,

## 2013-07-16 NOTE — ED Provider Notes (Signed)
CSN: [id number]     Arrival date & time 07/16/13  0403 History   First MD Initiated Contact with Patient 07/16/13 [phone]     Chief Complaint  Patient presents with  . Weakness  . Tachycardia     (Consider location/radiation/quality/duration/timing/severity/associated sxs/prior Treatment) Patient is a 38 y.o. female presenting with weakness. The history is provided by the patient.  Weakness  She had her first dose of Taxotere for metastatic breast cancer 4 days ago. 2 days ago, she started having nausea, vomiting, frequent bowel movements. She states that she has a bowel movement every time she urinates although it is not diarrhea. She's had some epigastric pain which is severe and she rates it at 10/10. This is worse as she is to try and move her bowels and then subsides following bowel movement. There is no radiation of this pain. There is associated diaphoresis. She also notes that her heart is racing when she lays down.  She has not had fever or chills.  Past Medical History  Diagnosis Date  . Cancer     breast ca   Past Surgical History  Procedure Laterality Date  . Bilateral total mastectomy with axillary lymph node dissection    . Tonsillectomy    . Leep    . Portacath placement N/A 06/16/2013    Procedure: POWER PORT PLACEMENT;  Surgeon: Shann Medal, MD;  Location: [facility];  Service: General;  Laterality: N/A;  . Axillary lymph node dissection Left 06/16/2013    Procedure: LEFT AXILLARY NODE DISSECTION;  Surgeon: Shann Medal, MD;  Location: [facility];  Service: General;  Laterality: Left;   Family History  Problem Relation Age of Onset  . Other Mother     glioblastoma  . Other Father     glioblastoma   History  Substance Use Topics  . Smoking status: Never Smoker   . Smokeless tobacco: Never Used  . Alcohol Use: No   OB History   Grav Para Term Preterm Abortions TAB SAB Ect Mult Living                 Review of Systems  Neurological: Positive for weakness.  All  other systems reviewed and are negative.      Allergies  Sulfa antibiotics; Gadolinium derivatives; and Petrolatum  Home Medications   Current Outpatient Rx  Name  Route  Sig  Dispense  Refill  . dexamethasone (DECADRON) 4 MG tablet      2 tabs by mouth with food twice daily on day before and 3 days after each chemo   45 tablet   1   . dicyclomine (BENTYL) 10 MG capsule   Oral   Take 1 capsule (10 mg total) by mouth 4 (four) times daily -  before meals and at bedtime.   60 capsule   1   . HYDROcodone-acetaminophen (NORCO/VICODIN) 5-325 MG per tablet   Oral   Take 1-2 tablets by mouth every 6 (six) hours as needed.   30 tablet   0   . lidocaine-prilocaine (EMLA) cream   Topical   Apply 1 application topically as needed. 1-2 hours before each port access   30 g   2   . LORazepam (ATIVAN) 0.5 MG tablet   Oral   Take 1 tablet (0.5 mg total) by mouth at bedtime as needed for anxiety or sleep (or nausea).   30 tablet   0   . metaxalone (SKELAXIN) 800 MG tablet   Oral  Take 1 tablet (800 mg total) by mouth 3 (three) times daily as needed for muscle spasms.   60 tablet   0   . naproxen sodium (ANAPROX) 220 MG tablet   Oral   Take 440 mg by mouth 2 (two) times daily as needed (pain).         . ondansetron (ZOFRAN) 8 MG tablet      1 tab by mouth twice daily x 3 days after each chemo, then 1 tab by mouth every 12 hrs if needed for nausea   30 tablet   1   . prochlorperazine (COMPAZINE) 10 MG tablet      1 tab by mouth with meals and bedtime x 3 days after chemo, then 1 tab by mouth every 6 hrs if needed for nausea   30 tablet   2   . traMADol (ULTRAM) 50 MG tablet   Oral   Take 1 tablet (50 mg total) by mouth every 6 (six) hours as needed for moderate pain.   60 tablet   0    BP 120/80  Pulse 93  Temp(Src) 98.8 F (37.1 C) (Oral)  Resp 18  Ht 5\' 7"  (1.702 m)  Wt 202 lb (91.627 kg)  BMI 31.63 kg/m2  SpO2 99%  LMP 06/27/2013 Physical Exam   Nursing note and vitals reviewed.  38 year old female, resting comfortably and in no acute distress. Vital signs are  significant for tachycardia with heart rate of 123, but repeat vital signs show normal heart rate. Oxygen saturation is 99%, which is normal. Head is normocephalic and atraumatic. PERRLA, EOMI. Oropharynx is clear. Neck is nontender and supple without adenopathy or JVD. Back is nontender and there is no CVA tenderness. Lungs are clear without rales, wheezes, or rhonchi. Chest is nontender. Mediport is present in the right. Heart has regular rate and rhythm without murmur. Abdomen is soft, flat, with moderate epigastric tenderness. There is no rebound or guarding. There are no  masses or hepatosplenomegaly and peristalsis is normoactive. Extremities have no cyanosis or edema, full range of motion is present. Skin is warm and dry without rash. Neurologic: Mental status is normal, cranial nerves are intact, there are no motor or sensory deficits.  ED Course  Procedures (including critical care time) Labs Review Results for orders placed during the hospital encounter of 07/16/13  CBC WITH DIFFERENTIAL      Result Value Ref Range   WBC 3.5 (*) 4.0 - 10.5 K/uL   RBC 4.64  3.87 - 5.11 MIL/uL   Hemoglobin 13.1  12.0 - 15.0 g/dL   HCT 39.6  36.0 - 46.0 %   MCV 85.3  78.0 - 100.0 fL   MCH 28.2  26.0 - 34.0 pg   MCHC 33.1  30.0 - 36.0 g/dL   RDW 13.4  11.5 - 15.5 %   Platelets 195  150 - 400 K/uL   Neutrophils Relative % 60  43 - 77 %   Lymphocytes Relative 34  12 - 46 %   Monocytes Relative 2 (*) 3 - 12 %   Eosinophils Relative 4  0 - 5 %   Basophils Relative 0  0 - 1 %   Neutro Abs 2.1  1.7 - 7.7 K/uL   Lymphs Abs 1.2  0.7 - 4.0 K/uL   Monocytes Absolute 0.1  0.1 - 1.0 K/uL   Eosinophils Absolute 0.1  0.0 - 0.7 K/uL   Basophils Absolute 0.0  0.0 - 0.1 K/uL  WBC Morphology DOHLE BODIES     Smear Review LARGE PLATELETS PRESENT    COMPREHENSIVE METABOLIC PANEL       Result Value Ref Range   Sodium 135 (*) 137 - 147 mEq/L   Potassium 4.4  3.7 - 5.3 mEq/L   Chloride 97  96 - 112 mEq/L   CO2 25  19 - 32 mEq/L   Glucose, Bld 113 (*) 70 - 99 mg/dL   BUN 11  6 - 23 mg/dL   Creatinine, Ser 0.81  0.50 - 1.10 mg/dL   Calcium 10.1  8.4 - 10.5 mg/dL   Total Protein 8.4 (*) 6.0 - 8.3 g/dL   Albumin 4.0  3.5 - 5.2 g/dL   AST 16  0 - 37 U/L   ALT 14  0 - 35 U/L   Alkaline Phosphatase 92  39 - 117 U/L   Total Bilirubin 0.9  0.3 - 1.2 mg/dL   GFR calc non Af Amer 90 (*) >90 mL/min   GFR calc Af Amer >90  >90 mL/min  LIPASE, BLOOD      Result Value Ref Range   Lipase 18  11 - 59 U/L  URINALYSIS, ROUTINE W REFLEX MICROSCOPIC      Result Value Ref Range   Color, Urine AMBER (*) YELLOW   APPearance CLEAR  CLEAR   Specific Gravity, Urine 1.025  1.005 - 1.030   pH 6.0  5.0 - 8.0   Glucose, UA NEGATIVE  NEGATIVE mg/dL   Hgb urine dipstick NEGATIVE  NEGATIVE   Bilirubin Urine NEGATIVE  NEGATIVE   Ketones, ur NEGATIVE  NEGATIVE mg/dL   Protein, ur 30 (*) NEGATIVE mg/dL   Urobilinogen, UA 0.2  0.0 - 1.0 mg/dL   Nitrite NEGATIVE  NEGATIVE   Leukocytes, UA NEGATIVE  NEGATIVE  URINE MICROSCOPIC-ADD ON      Result Value Ref Range   Squamous Epithelial / LPF RARE  RARE   WBC, UA 0-2  <3 WBC/hpf   RBC / HPF 0-2  <3 RBC/hpf   Urine-Other MUCOUS PRESENT      EKG Interpretation   Date/Time:  Saturday July 16 2013 04:37:50 EDT Ventricular Rate:  95 PR Interval:  157 QRS Duration: 84 QT Interval:  376 QTC Calculation: 473 R Axis:   42 Text Interpretation:  Sinus rhythm Normal ECG No old tracing to compare  Confirmed by [facility]  MD, Tameka Hoiland ([id number]) on 07/16/2013 4:42:35 AM      MDM   Final diagnoses:  Epigastric pain  Nausea  Breast cancer metastasized to axillary lymph node-left    Abdominal pain, vomiting, diarrhea which are likely side effects to Taxotere. She will be given IV fluids, IV ondansetron, IV pantoprazole. Screening labs are obtained. I do  not see evidence of serious abdominal pathology and do not see an indication for CT scan. Currently, she is supine and heart rate is in the low 90s. Old records are reviewed and she had received CMF chemotherapy ending in 2012 and then was on tamoxifen for about one year following that.  Workup shows mild leukopenia consistent with recent chemotherapy. Leukopenia is not at a dangerous level. She feels better after IV fluids and IV ondansetron and pantoprazole. She is discharged with prescriptions for ondansetron and pantoprazole. She is to discuss with her oncologist whether symptoms are severe enough to warrant discontinuing her chemotherapy.  Delora Fuel, MD 57/26/20 [id number]

## 2013-07-17 ENCOUNTER — Encounter: Payer: Self-pay | Admitting: Physician Assistant

## 2013-07-18 ENCOUNTER — Other Ambulatory Visit: Payer: Self-pay | Admitting: Oncology

## 2013-07-18 ENCOUNTER — Telehealth: Payer: Self-pay | Admitting: *Deleted

## 2013-07-18 ENCOUNTER — Other Ambulatory Visit: Payer: Self-pay | Admitting: *Deleted

## 2013-07-18 ENCOUNTER — Ambulatory Visit (HOSPITAL_BASED_OUTPATIENT_CLINIC_OR_DEPARTMENT_OTHER): Payer: 59

## 2013-07-18 ENCOUNTER — Ambulatory Visit (HOSPITAL_BASED_OUTPATIENT_CLINIC_OR_DEPARTMENT_OTHER): Payer: 59 | Admitting: Oncology

## 2013-07-18 VITALS — BP 123/72 | HR 92 | Temp 99.0°F | Resp 20

## 2013-07-18 DIAGNOSIS — R1110 Vomiting, unspecified: Secondary | ICD-10-CM

## 2013-07-18 DIAGNOSIS — R52 Pain, unspecified: Secondary | ICD-10-CM

## 2013-07-18 DIAGNOSIS — E860 Dehydration: Secondary | ICD-10-CM

## 2013-07-18 DIAGNOSIS — R197 Diarrhea, unspecified: Secondary | ICD-10-CM

## 2013-07-18 DIAGNOSIS — C50919 Malignant neoplasm of unspecified site of unspecified female breast: Secondary | ICD-10-CM

## 2013-07-18 DIAGNOSIS — C773 Secondary and unspecified malignant neoplasm of axilla and upper limb lymph nodes: Secondary | ICD-10-CM

## 2013-07-18 DIAGNOSIS — R109 Unspecified abdominal pain: Secondary | ICD-10-CM

## 2013-07-18 DIAGNOSIS — Z170 Estrogen receptor positive status [ER+]: Secondary | ICD-10-CM

## 2013-07-18 LAB — CBC WITH DIFFERENTIAL/PLATELET
BASO%: 0.7 % (ref 0.0–2.0)
BASOS ABS: 0.1 10*3/uL (ref 0.0–0.1)
EOS%: 0.5 % (ref 0.0–7.0)
Eosinophils Absolute: 0 10*3/uL (ref 0.0–0.5)
HEMATOCRIT: 34.1 % — AB (ref 34.8–46.6)
HGB: 11.2 g/dL — ABNORMAL LOW (ref 11.6–15.9)
LYMPH#: 2 10*3/uL (ref 0.9–3.3)
LYMPH%: 26.7 % (ref 14.0–49.7)
MCH: 27.4 pg (ref 25.1–34.0)
MCHC: 32.8 g/dL (ref 31.5–36.0)
MCV: 83.4 fL (ref 79.5–101.0)
MONO#: 1.9 10*3/uL — AB (ref 0.1–0.9)
MONO%: 25.5 % — ABNORMAL HIGH (ref 0.0–14.0)
NEUT#: 3.4 10*3/uL (ref 1.5–6.5)
NEUT%: 46.6 % (ref 38.4–76.8)
Platelets: 234 10*3/uL (ref 145–400)
RBC: 4.09 10*6/uL (ref 3.70–5.45)
RDW: 13 % (ref 11.2–14.5)
WBC: 7.4 10*3/uL (ref 3.9–10.3)

## 2013-07-18 MED ORDER — FAMOTIDINE IN NACL 20-0.9 MG/50ML-% IV SOLN
20.0000 mg | Freq: Two times a day (BID) | INTRAVENOUS | Status: DC
  Administered 2013-07-18: 20 mg via INTRAVENOUS

## 2013-07-18 MED ORDER — ONDANSETRON 8 MG/NS 50 ML IVPB
INTRAVENOUS | Status: AC
  Filled 2013-07-18: qty 8

## 2013-07-18 MED ORDER — MORPHINE SULFATE 4 MG/ML IJ SOLN
INTRAMUSCULAR | Status: AC
  Filled 2013-07-18: qty 1

## 2013-07-18 MED ORDER — OMEPRAZOLE 40 MG PO CPDR: 40.0000 mg | DELAYED_RELEASE_CAPSULE | Freq: Every day | ORAL | Status: DC

## 2013-07-18 MED ORDER — ONDANSETRON 8 MG/50ML IVPB (CHCC)
8.0000 mg | Freq: Once | INTRAVENOUS | Status: DC
Start: 2013-07-18 — End: 2013-07-18
  Administered 2013-07-18: 8 mg via INTRAVENOUS

## 2013-07-18 MED ORDER — MORPHINE SULFATE 4 MG/ML IJ SOLN
2.0000 mg | INTRAMUSCULAR | Status: DC | PRN
  Administered 2013-07-18: 2 mg via INTRAVENOUS

## 2013-07-18 MED ORDER — HEPARIN SOD (PORK) LOCK FLUSH 100 UNIT/ML IV SOLN
500.0000 [IU] | Freq: Once | INTRAVENOUS | Status: AC
  Administered 2013-07-18: 500 [IU] via INTRAVENOUS
  Filled 2013-07-18: qty 5

## 2013-07-18 MED ORDER — SODIUM CHLORIDE 0.9 % IV SOLN
1000.0000 mL | INTRAVENOUS | Status: DC
  Administered 2013-07-18: 1000 mL via INTRAVENOUS

## 2013-07-18 MED ORDER — SODIUM CHLORIDE 0.9 % IJ SOLN
10.0000 mL | INTRAMUSCULAR | Status: DC | PRN
  Administered 2013-07-18: 10 mL via INTRAVENOUS
  Filled 2013-07-18: qty 10

## 2013-07-18 MED ORDER — FAMOTIDINE IN NACL 20-0.9 MG/50ML-% IV SOLN
INTRAVENOUS | Status: AC
  Filled 2013-07-18: qty 50

## 2013-07-18 NOTE — Progress Notes (Signed)
[place]  [phone] [phone]     ID: [name] OB: 04/19/1976  MR#: [id number]  RVU#:[id number]  PCP: [name] GYN:   SU: [name] OTHER MD: [name] [phone])  CHIEF COMPLAINT:  Hx of Left Breast Cancer with Left Axillary Recurrence/Receiving Chemotherapy   HISTORY OF PRESENT ILLNESS: [name] had bilateral diagnostic mammography [facility] in [place] 11/30/2009 showing a 1.5 cm mass at the 9:00 position of the left breast with some satellites. A biopsy of the mass in question [name] showed ([id number]) and invasive lobular carcinoma, which was estrogen and progesterone receptor both 100% positive, both with strong staining intensity, but HER-2 negative at 1+. Bilateral breast MRI 12/13/2009 in [place] showed in the left breast an irregularly marginated mass measuring 3.2 cm. There were 4 or 5 separate satellite lesions laterally and superior to the primary.  Accordingly, after appropriate discussion, the patient underwent bilateral mastectomies with left sentinel lymph node sampling 01/02/2010. The right breast was benign. The left breast showed a 2.3 cm invasive lobular carcinoma, grade 3, with a total of 6 sentinel lymph nodes removed, all negative, although 2 showed isolated tumor cells by immunostaining. Margins were negative.  An Oncotype BX was sent, with a recurrence score of 22, predicting a risk of distant recurrence within 10 years with 14% of the patients only systemic treatment was tamoxifen. The patient was treated with CMF chemotherapy between 02/22/2010 and 07/05/2010, receiving 7 cycles at which point the patient refused further chemotherapy though there have been no major toxicities at according to the oncology note from Dr. Brent General. The patient was then started on tamoxifen 08/09/2010. By her cancer took approximately 12-15 months then stopped  because of hot flashes and insomnia problems.  More recently, the patient noted a change in her left axilla andunderwent bilateral breast MRI January to 2015 at [facility]. The patient has bilateral submuscular silicone implants in place. The breast were unremarkable, but there were several lymph nodes with cortical thickening in the left axilla including a dominant 1 measuring 1.2 cm in the short axis. Ultrasonography of this area identified the lymph node in question and the patient underwent biopsy of the left axillary lymph node This showed ([id number]) near-total replacement of the lymph node by metastatic carcinoma. This was 96% estrogen receptor positive, and 84% progesterone receptor positive, both with strong staining intensity. The MIB-1 was 20%. HER-2 determination is pending.  The patient's subsequent history is as detailed below  INTERVAL HISTORY: [name] returns today for an unscheduled visit. This is day 8 cycle 1 of her first cycle of carboplatin and gemcitabine.  She did well with the first 3 days. In the evening of day 4, namely 07/14/2013, she started having epigastric pain. She continued to take her medicine as prescribed, but by that evening he she was having in addition diarrhea, and some vomiting. She became dehydrated. She ended up being evaluated in the emergency room the following day, There she received intravenous fluids, and some nausea medication and pain medication. However the problem did not resolve and she is here today for fluids and further evaluation.   REVIEW OF SYSTEMS: [name] tells me she has been having up to 13 bowel movements a day--every time she urinates. They're not particularly loose. The nausea and vomiting have resolved. She still has no appetite. She feels the fluids she received in the ER did help. She didn't fill the other prescriptions they gave  her partly because there were duplicate with what she already had. She tells me her bowel movements  have become "very dark"--not  black. There has been no bright red blood per rectum. She is coughing a little yellow phlegm, without shortness of breath, pleurisy, or fever. She has felt weak. A detailed review of systems today was otherwise stable   PAST MEDICAL HISTORY: Past Medical History  Diagnosis Date  . Cancer     breast ca    PAST SURGICAL HISTORY: Past Surgical History  Procedure Laterality Date  . Bilateral total mastectomy with axillary lymph node dissection    . Tonsillectomy    . Leep    . Portacath placement N/A 06/16/2013    Procedure: POWER PORT PLACEMENT;  Surgeon: Shann Medal, MD;  Location: [facility];  Service: General;  Laterality: N/A;  . Axillary lymph node dissection Left 06/16/2013    Procedure: LEFT AXILLARY NODE DISSECTION;  Surgeon: Shann Medal, MD;  Location: [facility];  Service: General;  Laterality: Left;    FAMILY HISTORY Family History  Problem Relation Age of Onset  . Other Mother     glioblastoma  . Other Father     glioblastoma   according to the patient both her parents died from a primary brain cancers, namely we have the stomas, her father at 64, her mother at 20. The patient had one brother and one sister. There is no history of breast or ovarian cancer in the family  GYNECOLOGIC HISTORY:  Menarche age 56. The patient was never carried a child to term. She is still having regular periods, most recently second week in January. 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 She shares a home with her significant other, [name], who works at installing cisterns. They have no pets.    ADVANCED DIRECTIVES: Not in place   HEALTH MAINTENANCE:  (Updated 07/06/2013) History  Substance Use Topics  . Smoking status: Never Smoker   . Smokeless tobacco: Never Used  . Alcohol Use: No      Colonoscopy: Never  PAP: December 2014  Bone density: Never  Lipid panel: Not on file   Allergies  Allergen Reactions  . Sulfa Antibiotics Swelling  . Gadolinium Derivatives Hives    After gado injection, pt had a few small hives on left breast area. Treated with benadryl by dr Janeece Fitting   . Petrolatum Rash    Current Outpatient Prescriptions  Medication Sig Dispense Refill  . dexamethasone (DECADRON) 4 MG tablet 2 tabs by mouth with food twice daily on day before and 3 days after each chemo  45 tablet  1  . dicyclomine (BENTYL) 10 MG capsule Take 1 capsule (10 mg total) by mouth 4 (four) times daily -  before meals and at bedtime.  60 capsule  1  . HYDROcodone-acetaminophen (NORCO/VICODIN) 5-325 MG per tablet Take 1-2 tablets by mouth every 6 (six) hours as needed.  30 tablet  0  . lidocaine-prilocaine (EMLA) cream Apply 1 application topically as needed. 1-2 hours before each port access  30 g  2  . LORazepam (ATIVAN) 0.5 MG tablet Take 1 tablet (0.5 mg total) by mouth at bedtime as needed for anxiety or sleep (or nausea).  30 tablet  0  . metaxalone (SKELAXIN) 800 MG tablet Take 1 tablet (800 mg total) by mouth 3 (three) times  daily as needed for muscle spasms.  60 tablet  0  . naproxen sodium (ANAPROX) 220 MG tablet Take 440 mg by mouth 2 (two) times daily as needed (pain).      . ondansetron (ZOFRAN) 4 MG tablet Take 1 tablet (4 mg total) by mouth every 6 (six) hours as needed for nausea or vomiting.  20 tablet  0  . ondansetron (ZOFRAN) 8 MG tablet 1 tab by mouth twice daily x 3 days after each chemo, then 1 tab by mouth every 12 hrs if needed for nausea  30 tablet  1  . pantoprazole (PROTONIX) 40 MG tablet Take 1 tablet (40 mg total) by mouth daily.  30 tablet  0  . prochlorperazine (COMPAZINE) 10 MG tablet 1 tab by mouth with meals and bedtime x 3 days after chemo, then 1 tab by mouth every 6 hrs if needed for nausea  30 tablet  2  . traMADol (ULTRAM) 50 MG tablet Take 1  tablet (50 mg total) by mouth every 6 (six) hours as needed for moderate pain.  60 tablet  0   No current facility-administered medications for this visit.   Facility-Administered Medications Ordered in Other Visits  Medication Dose Route Frequency Provider Last Rate Last Dose  . morphine 4 MG/ML injection 2 mg  2 mg Intravenous Q2H PRN Chauncey Cruel, MD   2 mg at 07/18/13 1045    OBJECTIVE: Young [place] American woman who appears slightly uncomfortable, but is in no acute distress There were no vitals filed for this visit.   There is no weight on file to calculate BMI.    ECOG FS:1 - Symptomatic but completely ambulatory There were no vitals filed for this visit. Vitals - 1 value per visit 8/45/3646  SYSTOLIC 803  DIASTOLIC 72  Pulse 92  Temperature 99  Respirations 20  Weight (lb)   Height   BMI   VISIT REPORT     Physical Exam: Sclerae unicteric, pupils equal and reactive Oropharynx clear and moist-- no thrush or other lesions No cervical or supraclavicular adenopathy Lungs no rales or rhonchi, good excursion bilaterally Heart regular rate and rhythm Abd soft, nontender, positive bowel sounds; she points to the epigastrium as a site of pain. It is localizable with one finger MSK no focal spinal tenderness, no upper extremity lymphedema Neuro: nonfocal, well oriented, appropriate affect Breasts: Deferred  LAB RESULTS:   Lab Results  Component Value Date   WBC 7.4 07/18/2013   NEUTROABS 3.4 07/18/2013   HGB 11.2* 07/18/2013   HCT 34.1* 07/18/2013   MCV 83.4 07/18/2013   PLT 234 07/18/2013      Chemistry      Component Value Date/Time   NA 136 07/18/2013 1029   NA 135* 07/16/2013 0535   K 3.7 07/18/2013 1029   K 4.4 07/16/2013 0535   CL 97 07/16/2013 0535   CO2 25 07/18/2013 1029   CO2 25 07/16/2013 0535   BUN 6.7* 07/18/2013 1029   BUN 11 07/16/2013 0535   CREATININE 0.7 07/18/2013 1029   CREATININE 0.81 07/16/2013 0535      Component Value Date/Time   CALCIUM 9.9  07/18/2013 1029   CALCIUM 10.1 07/16/2013 0535   ALKPHOS 76 07/18/2013 1029   ALKPHOS 92 07/16/2013 0535   AST 14 07/18/2013 1029   AST 16 07/16/2013 0535   ALT 15 07/18/2013 1029   ALT 14 07/16/2013 0535   BILITOT 0.25 07/18/2013 1029   BILITOT 0.9 07/16/2013 0535  STUDIES: No results found.   ASSESSMENT: 38 y.o. [place] woman  (1) status post bilateral mastectomies with left axillary lymph node sampling [6 nodes removed] 01/02/2010 for a pT2 pN0(i+), stage IIA invasive lobular breast cancer, grade 3, estrogen and progesterone receptor positive, HER-2 not amplified; right breast was benign  (2) Oncotype DX score of 22 predicted a 14% risk of distant recurrence within 10 years if the patient's only systemic treatment is tamoxifen for 5 years  (3) status post CMF(cyclophosphamide, fluorouracil, methotrexate) x7 given between October of 2011 and March of 2012 (7 of 8 planned treatments completed)  (4) tamoxifen started April 2012, discontinued approximately July of 2013 (about 15 months) because of problems with hot flashes and insomnia  (5) pathologically documented left axillary recurrence 05/05/2013, the tumor being again estrogen and progesterone receptor positive, with HER-2 not amplified, and MIB-1 of 15%. Staging CT/PET 05/27/2013 show left axillary and supraclavicular recurrence, no distant disease  (6) status post completion left axillary lymph node dissection 06/16/2013 showing a total of 10/15 lymph nodes involved by tumor, with evidence of extracapsular extension (TX N3 = stage IIIC)  (7) being treated with carboplatin/ docetaxel, first dose on 07/11/2013, to be repeated every 21 days x 6 with Neulasta support on day 2  (8) radiation to follow chemotherapy  (9) bilateral salpingo-oophorectomy later this year, followed by aromatase inhibitor therapy for at least 5 years   PLAN: I don't think the pain in the abdomen that Pete has been feeling is going to be directly  related to her chemotherapy. Possibly the antinausea medications, namely Decadron, could have irritated her stomach. The place where she is pointing to is really the epigastrium exactly where she would be having reflux issues.  She could have developed an ulcer, and this may explain the darker bowel movements, however her hemoglobin is essentially stable at 11.2.  I think the best thing to do is try Prilosec 40 mg at bedtime for the next few weeks and see that takes care of the problem. However the problem recurs exactly the same way with the second cycle of chemotherapy, we will have to change her protocol. In that case likely we would go to Cytoxan and Adriamycin to be followed by Taxol.  She is having a little bit of yellow phlegm, but no fever, and her lungs are clear. She will let us know if she develops a fever. She will see [place] again in 2 days just to make sure everything is fine. The plan at this point is to proceed with a second cycle pretty much as before, with the addition of Prilosec. She knows to call for any problems that may develop before her next visit here   Chauncey Cruel, MD   07/18/2013 12:29 PM

## 2013-07-18 NOTE — Progress Notes (Signed)
Patient arrived to infusion room and assessed by Nyoka Cowden, RN. RN reported to me that pt is c/o ABD pain, vomiting, and black stool. Pt reports she has not kept food down since Thursday. This was reported to Mateo Flow, RN to Dr. Jana Hakim. RN notified that patient is requesting provider to see her in infusion room while she receives IVF. Per Mateo Flow, someone will come by to see her shortly.   1235: Dr. Jana Hakim assessing patient in the infusion room at this time.   Prior to discharge, patient ate graham crackers and did note increase in ABD discomfort. She states she did notify Dr. Jana Hakim of this ABD pain with eating. He called in rx for prilosec. Patient instructed to take first dose upon pick up. Pt cal also take prn pain medication prior to eating to see if this helps. Patient instructed to call clinic if she cannot tolerate eating and she verbalizes understanding of the above.

## 2013-07-18 NOTE — Progress Notes (Signed)
[name] called to this RN to state " feeling terrible since Thursday ".  Per phone discussion- [name] states onset of abdominal intermittant abdominal cramping that then progressed into consistant pain. She felt nauseated and was unable to drink or eat. She went to the ER over the weekend and obtained IV medications which helped relieved the symptoms but upon returning home symptoms recurred.  [name] states she is not using pain medications nor has filled prescriptions per ER " because I don't just want to keep taking medications not knowing what is wrong- and I wanted to talk with your office before taking them " " and could I get a scan or something of my stomach to see why I am having pain ?"  Per discussion with MD and review of ER documentation- plan to bring pt in for IV fluid and medication.  Pt informed of the above- orders obtained.

## 2013-07-18 NOTE — Patient Instructions (Signed)
Dehydration, Adult Dehydration is when you lose more fluids from the body than you take in. Vital organs like the kidneys, brain, and heart cannot function without a proper amount of fluids and salt. Any loss of fluids from the body can cause dehydration.  CAUSES   Vomiting.  Diarrhea.  Excessive sweating.  Excessive urine output.  Fever. SYMPTOMS  Mild dehydration  Thirst.  Dry lips.  Slightly dry mouth. Moderate dehydration  Very dry mouth.  Sunken eyes.  Skin does not bounce back quickly when lightly pinched and released.  Dark urine and decreased urine production.  Decreased tear production.  Headache. Severe dehydration  Very dry mouth.  Extreme thirst.  Rapid, weak pulse (more than 100 beats per minute at rest).  Cold hands and feet.  Not able to sweat in spite of heat and temperature.  Rapid breathing.  Blue lips.  Confusion and lethargy.  Difficulty being awakened.  Minimal urine production.  No tears. DIAGNOSIS  Your caregiver will diagnose dehydration based on your symptoms and your exam. Blood and urine tests will help confirm the diagnosis. The diagnostic evaluation should also identify the cause of dehydration. TREATMENT  Treatment of mild or moderate dehydration can often be done at home by increasing the amount of fluids that you drink. It is best to drink small amounts of fluid more often. Drinking too much at one time can make vomiting worse. Refer to the home care instructions below. Severe dehydration needs to be treated at the hospital where you will probably be given intravenous (IV) fluids that contain water and electrolytes. HOME CARE INSTRUCTIONS   Ask your caregiver about specific rehydration instructions.  Drink enough fluids to keep your urine clear or pale yellow.  Drink small amounts frequently if you have nausea and vomiting.  Eat as you normally do.  Avoid:  Foods or drinks high in sugar.  Carbonated  drinks.  Juice.  Extremely hot or cold fluids.  Drinks with caffeine.  Fatty, greasy foods.  Alcohol.  Tobacco.  Overeating.  Gelatin desserts.  Wash your hands well to avoid spreading bacteria and viruses.  Only take over-the-counter or prescription medicines for pain, discomfort, or fever as directed by your caregiver.  Ask your caregiver if you should continue all prescribed and over-the-counter medicines.  Keep all follow-up appointments with your caregiver. SEEK MEDICAL CARE IF:  You have abdominal pain and it increases or stays in one area (localizes).  You have a rash, stiff neck, or severe headache.  You are irritable, sleepy, or difficult to awaken.  You are weak, dizzy, or extremely thirsty. SEEK IMMEDIATE MEDICAL CARE IF:   You are unable to keep fluids down or you get worse despite treatment.  You have frequent episodes of vomiting or diarrhea.  You have blood or green matter (bile) in your vomit.  You have blood in your stool or your stool looks black and tarry.  You have not urinated in 6 to 8 hours, or you have only urinated a small amount of very dark urine.  You have a fever.  You faint. MAKE SURE YOU:   Understand these instructions.  Will watch your condition.  Will get help right away if you are not doing well or get worse. Document Released: 04/14/2005 Document Revised: 07/07/2011 Document Reviewed: 12/02/2010 Exit[employer] Patient Information 2014 [employer].  

## 2013-07-19 ENCOUNTER — Ambulatory Visit: Payer: 59

## 2013-07-19 ENCOUNTER — Other Ambulatory Visit: Payer: 59

## 2013-07-20 ENCOUNTER — Telehealth: Payer: Self-pay | Admitting: *Deleted

## 2013-07-20 ENCOUNTER — Other Ambulatory Visit (HOSPITAL_BASED_OUTPATIENT_CLINIC_OR_DEPARTMENT_OTHER): Payer: 59

## 2013-07-20 ENCOUNTER — Ambulatory Visit (HOSPITAL_BASED_OUTPATIENT_CLINIC_OR_DEPARTMENT_OTHER): Payer: 59 | Admitting: Physician Assistant

## 2013-07-20 ENCOUNTER — Other Ambulatory Visit: Payer: Self-pay | Admitting: Physician Assistant

## 2013-07-20 ENCOUNTER — Encounter: Payer: Self-pay | Admitting: Physician Assistant

## 2013-07-20 ENCOUNTER — Telehealth: Payer: Self-pay | Admitting: Oncology

## 2013-07-20 ENCOUNTER — Ambulatory Visit: Payer: 59

## 2013-07-20 ENCOUNTER — Encounter: Payer: Self-pay | Admitting: Oncology

## 2013-07-20 VITALS — BP 132/87 | HR 96 | Temp 98.5°F | Resp 18 | Ht 67.0 in | Wt 200.2 lb

## 2013-07-20 DIAGNOSIS — R059 Cough, unspecified: Secondary | ICD-10-CM

## 2013-07-20 DIAGNOSIS — R1013 Epigastric pain: Secondary | ICD-10-CM | POA: Insufficient documentation

## 2013-07-20 DIAGNOSIS — D649 Anemia, unspecified: Secondary | ICD-10-CM

## 2013-07-20 DIAGNOSIS — R197 Diarrhea, unspecified: Secondary | ICD-10-CM

## 2013-07-20 DIAGNOSIS — C50919 Malignant neoplasm of unspecified site of unspecified female breast: Secondary | ICD-10-CM

## 2013-07-20 DIAGNOSIS — Z170 Estrogen receptor positive status [ER+]: Secondary | ICD-10-CM

## 2013-07-20 DIAGNOSIS — R05 Cough: Secondary | ICD-10-CM | POA: Insufficient documentation

## 2013-07-20 DIAGNOSIS — C773 Secondary and unspecified malignant neoplasm of axilla and upper limb lymph nodes: Secondary | ICD-10-CM

## 2013-07-20 DIAGNOSIS — R112 Nausea with vomiting, unspecified: Secondary | ICD-10-CM

## 2013-07-20 LAB — CBC WITH DIFFERENTIAL/PLATELET
BASO%: 0.2 % (ref 0.0–2.0)
BASOS ABS: 0 10*3/uL (ref 0.0–0.1)
EOS%: 0.1 % (ref 0.0–7.0)
Eosinophils Absolute: 0 10*3/uL (ref 0.0–0.5)
HEMATOCRIT: 34.1 % — AB (ref 34.8–46.6)
HEMOGLOBIN: 10.9 g/dL — AB (ref 11.6–15.9)
LYMPH%: 14.4 % (ref 14.0–49.7)
MCH: 27.4 pg (ref 25.1–34.0)
MCHC: 31.9 g/dL (ref 31.5–36.0)
MCV: 85.9 fL (ref 79.5–101.0)
MONO#: 1 10*3/uL — ABNORMAL HIGH (ref 0.1–0.9)
MONO%: 5.6 % (ref 0.0–14.0)
NEUT#: 13.8 10*3/uL — ABNORMAL HIGH (ref 1.5–6.5)
NEUT%: 79.7 % — ABNORMAL HIGH (ref 38.4–76.8)
Platelets: 292 10*3/uL (ref 145–400)
RBC: 3.97 10*6/uL (ref 3.70–5.45)
RDW: 13.6 % (ref 11.2–14.5)
WBC: 17.4 10*3/uL — ABNORMAL HIGH (ref 3.9–10.3)
lymph#: 2.5 10*3/uL (ref 0.9–3.3)

## 2013-07-20 LAB — COMPREHENSIVE METABOLIC PANEL (CC13)
ALT: 13 U/L (ref 0–55)
AST: 14 U/L (ref 5–34)
Albumin: 3.2 g/dL — ABNORMAL LOW (ref 3.5–5.0)
Alkaline Phosphatase: 93 U/L (ref 40–150)
Anion Gap: 10 mEq/L (ref 3–11)
BUN: 5.9 mg/dL — AB (ref 7.0–26.0)
CALCIUM: 10 mg/dL (ref 8.4–10.4)
CO2: 26 mEq/L (ref 22–29)
Chloride: 103 mEq/L (ref 98–109)
Creatinine: 0.8 mg/dL (ref 0.6–1.1)
Glucose: 117 mg/dl (ref 70–140)
Potassium: 3.2 mEq/L — ABNORMAL LOW (ref 3.5–5.1)
Sodium: 139 mEq/L (ref 136–145)
Total Bilirubin: <0.2 mg/dL (ref 0.20–1.20)
Total Protein: 7.2 g/dL (ref 6.4–8.3)

## 2013-07-20 LAB — CLOSTRIDIUM DIFFICILE BY PCR: CDIFFPCR: NEGATIVE

## 2013-07-20 MED ORDER — CHOLESTYRAMINE 4 G PO PACK
4.0000 g | PACK | Freq: Two times a day (BID) | ORAL | Status: DC | PRN
Start: 2013-07-20 — End: 2013-07-21

## 2013-07-20 MED ORDER — PROCHLORPERAZINE 25 MG RE SUPP: 25.0000 mg | Freq: Two times a day (BID) | RECTAL | Status: DC | PRN

## 2013-07-20 NOTE — Progress Notes (Signed)
Enrolled pt in the Neulasta First Step program.  I faxed signed form and activated card today.  °

## 2013-07-20 NOTE — Telephone Encounter (Signed)
Sent the pt back to the lab for her fecal occult.

## 2013-07-20 NOTE — Telephone Encounter (Signed)
Patient called to leave a message to let Micah Flesher, PA know that she can now call her Rx's to [facility] where she can utilize her [place] benefits through [facility]. Will forward this information.

## 2013-07-20 NOTE — Progress Notes (Signed)
[name]  [phone]) [phone] [phone]) [phone]     ID: [name] OB: 1975-12-05  MR#: [id number]  OEV#:[id number]  PCP: Colvin Caroli GYN:   SU: Alphonsa Overall OTHER MD: Lonia Chimera [phone])  CHIEF COMPLAINT:  Hx of Left Breast Cancer with Left Axillary Recurrence/Receiving Chemotherapy   HISTORY OF PRESENT ILLNESS: [name] had bilateral diagnostic mammography [facility] in [place] 11/30/2009 showing a 1.5 cm mass at the 9:00 position of the left breast with some satellites. A biopsy of the mass in question [place] [facility] showed ([id number]) and invasive lobular carcinoma, which was estrogen and progesterone receptor both 100% positive, both with strong staining intensity, but HER-2 negative at 1+. Bilateral breast MRI 12/13/2009 in [place] showed in the left breast an irregularly marginated mass measuring 3.2 cm. There were 4 or 5 separate satellite lesions laterally and superior to the primary.  Accordingly, after appropriate discussion, the patient underwent bilateral mastectomies with left sentinel lymph node sampling 01/02/2010. The right breast was benign. The left breast showed a 2.3 cm invasive lobular carcinoma, grade 3, with a total of 6 sentinel lymph nodes removed, all negative, although 2 showed isolated tumor cells by immunostaining. Margins were negative.  An Oncotype BX was sent, with a recurrence score of 22, predicting a risk of distant recurrence within 10 years with 14% of the patients only systemic treatment was tamoxifen. The patient was treated with CMF chemotherapy between 02/22/2010 and 07/05/2010, receiving 7 cycles at which point the patient refused further chemotherapy though there have been no major toxicities at according to the oncology note from Dr. Brent General. The patient was then started on tamoxifen 08/09/2010. By her cancer took approximately 12-15 months then stopped  because of hot flashes and insomnia problems.  More recently, the patient noted a change in her left axilla andunderwent bilateral breast MRI January to 2015 at [place]. The patient has bilateral submuscular silicone implants in place. The breast were unremarkable, but there were several lymph nodes with cortical thickening in the left axilla including a dominant 1 measuring 1.2 cm in the short axis. Ultrasonography of this area identified the lymph node in question and the patient underwent biopsy of the left axillary lymph node This showed ([id number]) near-total replacement of the lymph node by metastatic carcinoma. This was 96% estrogen receptor positive, and 84% progesterone receptor positive, both with strong staining intensity. The MIB-1 was 20%. HER-2 determination is pending.  The patient's subsequent history is as detailed below  INTERVAL HISTORY: [name] returns today for followup, currently day 10 cycle 1 of adjuvant docetaxel/carboplatin for left breast carcinoma. She was seen here briefly on Monday by Dr. Jana Hakim, March 23, with complaints of epigastric pain, nausea, vomiting, and diarrhea with dark stools. She was started on Prilosec, 40 mg at bedtime which she has been taking daily. For nausea and vomiting has resolved, but she still has epigastric pain and is still having frequent non-formed dark bowel movements.  She denies any fevers or chills.   REVIEW OF SYSTEMS: Chelcea denies any rashes or skin changes. She's had no signs of abnormal bleeding, including rectal bleeding. She is trying to keep herself well hydrated. She's had no change in urinary habits and denies dysuria or hematuria. She has a cough productive of yellow phlegm but denies any pleurisy or increased shortness of breath. She's had no peripheral swelling and no chest pain or palpitations. She denies any abnormal headaches or dizziness. She currently denies  any unusual myalgias, arthralgias, or bony pain. She's  had no peripheral neuropathy.  A detailed review of systems is otherwise stable and noncontributory.   PAST MEDICAL HISTORY: Past Medical History  Diagnosis Date  . Cancer     breast ca    PAST SURGICAL HISTORY: Past Surgical History  Procedure Laterality Date  . Bilateral total mastectomy with axillary lymph node dissection    . Tonsillectomy    . Leep    . Portacath placement N/A 06/16/2013    Procedure: POWER PORT PLACEMENT;  Surgeon: Shann Medal, MD;  Location: [facility];  Service: General;  Laterality: N/A;  . Axillary lymph node dissection Left 06/16/2013    Procedure: LEFT AXILLARY NODE DISSECTION;  Surgeon: Shann Medal, MD;  Location: [facility];  Service: General;  Laterality: Left;    FAMILY HISTORY Family History  Problem Relation Age of Onset  . Other Mother     glioblastoma  . Other Father     glioblastoma   according to the patient both her parents died from a primary brain cancers, namely we have the stomas, her father at 96, her mother at 59. The patient had one brother and one sister. There is no history of breast or ovarian cancer in the family  GYNECOLOGIC HISTORY:  Menarche age 57. The patient has never carried a child to term. She is still having regular periods, most recently second week in January. 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 She shares a home with her significant other, [name], who works at installing cisterns. They have no pets.    ADVANCED DIRECTIVES: Not in place   HEALTH MAINTENANCE:  (Updated 07/06/2013) History  Substance Use Topics  . Smoking status: Never Smoker   . Smokeless tobacco: Never Used  . Alcohol Use: No     Colonoscopy: Never  PAP: December 2014  Bone density: Never  Lipid panel: Not on file   Allergies  Allergen Reactions  . Sulfa Antibiotics Swelling   . Gadolinium Derivatives Hives    After gado injection, pt had a few small hives on left breast area. Treated with benadryl by dr Janeece Fitting   . Petrolatum Rash    Current Outpatient Prescriptions  Medication Sig Dispense Refill  . dicyclomine (BENTYL) 10 MG capsule Take 1 capsule (10 mg total) by mouth 4 (four) times daily -  before meals and at bedtime.  60 capsule  1  . HYDROcodone-acetaminophen (NORCO/VICODIN) 5-325 MG per tablet Take 1-2 tablets by mouth every 6 (six) hours as needed.  30 tablet  0  . lidocaine-prilocaine (EMLA) cream Apply 1 application topically as needed. 1-2 hours before each port access  30 g  2  . omeprazole (PRILOSEC) 40 MG capsule Take 1 capsule (40 mg total) by mouth daily.  90 capsule  3  . traMADol (ULTRAM) 50 MG tablet Take 1 tablet (50 mg total) by mouth every 6 (six) hours as needed for moderate pain.  60 tablet  0  . cholestyramine (QUESTRAN) 4 G packet Take 1 packet (4 g total) by mouth 2 (two) times daily as needed. For diarrhea (Take with food)  30 each  0  . dexamethasone (DECADRON) 4 MG tablet 2 tabs by mouth with food twice daily on day before and 3 days after each chemo  45 tablet  1  . LORazepam (  ATIVAN) 0.5 MG tablet Take 1 tablet (0.5 mg total) by mouth at bedtime as needed for anxiety or sleep (or nausea).  30 tablet  0  . metaxalone (SKELAXIN) 800 MG tablet Take 1 tablet (800 mg total) by mouth 3 (three) times daily as needed for muscle spasms.  60 tablet  0  . naproxen sodium (ANAPROX) 220 MG tablet Take 440 mg by mouth 2 (two) times daily as needed (pain).      . ondansetron (ZOFRAN) 4 MG tablet Take 1 tablet (4 mg total) by mouth every 6 (six) hours as needed for nausea or vomiting.  20 tablet  0  . ondansetron (ZOFRAN) 8 MG tablet 1 tab by mouth twice daily x 3 days after each chemo, then 1 tab by mouth every 12 hrs if needed for nausea  30 tablet  1  . pantoprazole (PROTONIX) 40 MG tablet Take 1 tablet (40 mg total) by mouth daily.  30 tablet   0  . prochlorperazine (COMPAZINE) 10 MG tablet 1 tab by mouth with meals and bedtime x 3 days after chemo, then 1 tab by mouth every 6 hrs if needed for nausea  30 tablet  2  . prochlorperazine (COMPAZINE) 25 MG suppository Place 1 suppository (25 mg total) rectally every 12 (twelve) hours as needed for nausea or vomiting.  12 suppository  0   No current facility-administered medications for this visit.    OBJECTIVE: Young [place] American woman who appears weak and tired but is in no acute distress Filed Vitals:   07/20/13 1043  BP: 132/87  Pulse: 96  Temp: 98.5 F (36.9 C)  Resp: 18     Body mass index is 31.35 kg/(m^2).    ECOG FS:1 - Symptomatic but completely ambulatory Filed Weights   07/20/13 1043  Weight: 200 lb 3.2 oz (90.81 kg)   Physical Exam: HEENT:  Sclerae anicteric.  Oropharynx clear, pink and moist. No pharyngeal erythema. Neck is supple, trachea midline.  NODES:  No cervical or supraclavicular lymphadenopathy palpated.  BREAST EXAM:  Deferred. No axillary lymphadenopathy. LUNGS:  Clear to auscultation bilaterally with good excursion.  No wheezes or rhonchi HEART:  Regular rate and rhythm. No murmur  ABDOMEN:  Soft, tender to palpation of the epigastric region.   Positive bowel sounds x4, normoactive.  MSK:  No focal spinal tenderness to palpation. Good range of motion bilaterally in the upper extremities. EXTREMITIES:  No peripheral edema.  No lymphedema in the upper extremities. SKIN:  Benign with no visible rashes or skin lesions. No excessive ecchymoses or petechiae. NEURO:  Nonfocal. Well oriented.  Appropriate affect.    LAB RESULTS:   Lab Results  Component Value Date   WBC 17.4* 07/20/2013   NEUTROABS 13.8* 07/20/2013   HGB 10.9* 07/20/2013   HCT 34.1* 07/20/2013   MCV 85.9 07/20/2013   PLT 292 07/20/2013      Chemistry      Component Value Date/Time   NA 139 07/20/2013 1026   NA 135* 07/16/2013 0535   K 3.2* 07/20/2013 1026   K 4.4 07/16/2013 0535    CL 97 07/16/2013 0535   CO2 26 07/20/2013 1026   CO2 25 07/16/2013 0535   BUN 5.9* 07/20/2013 1026   BUN 11 07/16/2013 0535   CREATININE 0.8 07/20/2013 1026   CREATININE 0.81 07/16/2013 0535      Component Value Date/Time   CALCIUM 10.0 07/20/2013 1026   CALCIUM 10.1 07/16/2013 0535   ALKPHOS 93 07/20/2013 1026  ALKPHOS 92 07/16/2013 0535   AST 14 07/20/2013 1026   AST 16 07/16/2013 0535   ALT 13 07/20/2013 1026   ALT 14 07/16/2013 0535   BILITOT <0.20 07/20/2013 1026   BILITOT 0.9 07/16/2013 0535       STUDIES: No results found.   ASSESSMENT: 38 y.o. [place] woman  (1) status post bilateral mastectomies with left axillary lymph node sampling [6 nodes removed] 01/02/2010 for a pT2 pN0(i+), stage IIA invasive lobular breast cancer, grade 3, estrogen and progesterone receptor positive, HER-2 not amplified; right breast was benign  (2) Oncotype DX score of 22 predicted a 14% risk of distant recurrence within 10 years if the patient's only systemic treatment is tamoxifen for 5 years  (3) status post CMF(cyclophosphamide, fluorouracil, methotrexate) x7 given between October of 2011 and March of 2012 (7 of 8 planned treatments completed)  (4) tamoxifen started April 2012, discontinued approximately July of 2013 (about 15 months) because of problems with hot flashes and insomnia  (5) pathologically documented left axillary recurrence 05/05/2013, the tumor being again estrogen and progesterone receptor positive, with HER-2 not amplified, and MIB-1 of 15%. Staging CT/PET 05/27/2013 show left axillary and supraclavicular recurrence, no distant disease  (6) status post completion left axillary lymph node dissection 06/16/2013 showing a total of 10/15 lymph nodes involved by tumor, with evidence of extracapsular extension (TX N3 = stage IIIC)  (7) being treated with carboplatin/ docetaxel, first dose on 07/11/2013, to be repeated every 21 days x 6 with Neulasta support on day 2  (8) radiation to  follow chemotherapy  (9) bilateral salpingo-oophorectomy later this year, followed by aromatase inhibitor therapy for at least 5 years   PLAN: Fortunately, Devonne's nausea has resolved, but one point over the weekend it was so bad that she could not keep her antinausea medication down. I'm going to get her a prescription for Compazine suppositories to use up to twice daily if needed in the future for nausea and vomiting.   With regards to the loose stools, we are going to obtain a C. difficile toxin today, and are also going to check for occult blood in the stool since her stools continue to be extremely dark. Her hemoglobin is only slightly down today at 10.9, however. I am also giving her a prescription for Questran to use up to twice daily, taken with meals, for the diarrhea. I encouraged her to keep herself very well hydrated. I also suggested that she discontinue the  Bentyl, but continue on Prilosec daily.   I suggested she take Mucinex in the morning and Robitussin-DM at night for the cough. Certainly if this worsens, or if she begins to have shortness of breath or fever, she should let us know. At that point, she might need an antibiotic. Fortunately, however, she is not neutropenic, and in fact her counts have recovered very nicely after the Neulasta injection.  We will continue to follow Takeya very closely, and she knows to call us with any additional problems. Otherwise, the plan is to see her again on April 6 prior to her second dose of docetaxel/carboplatin. She voices her understanding and agreement with all of the above plan, and these instructions were also given to her in writing.    Alexandera Kuntzman, PA-C   07/20/2013 11:40 AM

## 2013-07-21 ENCOUNTER — Telehealth: Payer: Self-pay | Admitting: *Deleted

## 2013-07-21 DIAGNOSIS — C773 Secondary and unspecified malignant neoplasm of axilla and upper limb lymph nodes: Principal | ICD-10-CM

## 2013-07-21 DIAGNOSIS — R197 Diarrhea, unspecified: Secondary | ICD-10-CM

## 2013-07-21 DIAGNOSIS — R112 Nausea with vomiting, unspecified: Secondary | ICD-10-CM

## 2013-07-21 DIAGNOSIS — C50919 Malignant neoplasm of unspecified site of unspecified female breast: Secondary | ICD-10-CM

## 2013-07-21 MED ORDER — CHOLESTYRAMINE 4 G PO PACK: 4.0000 g | PACK | Freq: Two times a day (BID) | ORAL | Status: DC | PRN

## 2013-07-21 MED ORDER — POTASSIUM CHLORIDE ER 10 MEQ PO CPCR: 10.0000 meq | ORAL_CAPSULE | Freq: Two times a day (BID) | ORAL | Status: DC

## 2013-07-21 MED ORDER — PROCHLORPERAZINE 25 MG RE SUPP: 25.0000 mg | Freq: Two times a day (BID) | RECTAL | Status: DC | PRN

## 2013-07-21 NOTE — Telephone Encounter (Signed)
Called pt to inform her of potassium results(3.2 L).Communicated with pt about Micro K 41mEq Disp 60, Refills 2 sent to [place], along with Questran and Compazine supp. Explained to pt to start potassium today and told her to take twice a day until otherwise notified by Dr. Abbott Pao will see [place] again on August 01, 2013. Pt verbalized understanding. No further concerns. Message forwarded to Micah Flesher, PA-C.

## 2013-07-29 ENCOUNTER — Telehealth: Payer: Self-pay | Admitting: *Deleted

## 2013-07-29 NOTE — Telephone Encounter (Signed)
Mailed after appt letter to pt. 

## 2013-08-01 ENCOUNTER — Other Ambulatory Visit: Payer: 59

## 2013-08-01 ENCOUNTER — Ambulatory Visit (HOSPITAL_BASED_OUTPATIENT_CLINIC_OR_DEPARTMENT_OTHER): Payer: 59 | Admitting: Physician Assistant

## 2013-08-01 ENCOUNTER — Encounter: Payer: Self-pay | Admitting: *Deleted

## 2013-08-01 ENCOUNTER — Encounter: Payer: Self-pay | Admitting: Oncology

## 2013-08-01 ENCOUNTER — Ambulatory Visit (HOSPITAL_BASED_OUTPATIENT_CLINIC_OR_DEPARTMENT_OTHER): Payer: 59

## 2013-08-01 ENCOUNTER — Telehealth: Payer: Self-pay | Admitting: Physician Assistant

## 2013-08-01 ENCOUNTER — Other Ambulatory Visit (HOSPITAL_BASED_OUTPATIENT_CLINIC_OR_DEPARTMENT_OTHER): Payer: 59

## 2013-08-01 ENCOUNTER — Encounter: Payer: Self-pay | Admitting: Physician Assistant

## 2013-08-01 VITALS — BP 141/89 | HR 108 | Temp 98.7°F | Resp 18 | Ht 67.0 in | Wt 203.7 lb

## 2013-08-01 DIAGNOSIS — C50919 Malignant neoplasm of unspecified site of unspecified female breast: Secondary | ICD-10-CM

## 2013-08-01 DIAGNOSIS — M79609 Pain in unspecified limb: Secondary | ICD-10-CM

## 2013-08-01 DIAGNOSIS — R05 Cough: Secondary | ICD-10-CM

## 2013-08-01 DIAGNOSIS — F32A Depression, unspecified: Secondary | ICD-10-CM

## 2013-08-01 DIAGNOSIS — F3289 Other specified depressive episodes: Secondary | ICD-10-CM

## 2013-08-01 DIAGNOSIS — Z170 Estrogen receptor positive status [ER+]: Secondary | ICD-10-CM

## 2013-08-01 DIAGNOSIS — M549 Dorsalgia, unspecified: Secondary | ICD-10-CM

## 2013-08-01 DIAGNOSIS — C773 Secondary and unspecified malignant neoplasm of axilla and upper limb lymph nodes: Secondary | ICD-10-CM

## 2013-08-01 DIAGNOSIS — F411 Generalized anxiety disorder: Secondary | ICD-10-CM

## 2013-08-01 DIAGNOSIS — Z5111 Encounter for antineoplastic chemotherapy: Secondary | ICD-10-CM

## 2013-08-01 DIAGNOSIS — D649 Anemia, unspecified: Secondary | ICD-10-CM

## 2013-08-01 DIAGNOSIS — F329 Major depressive disorder, single episode, unspecified: Secondary | ICD-10-CM

## 2013-08-01 DIAGNOSIS — R059 Cough, unspecified: Secondary | ICD-10-CM

## 2013-08-01 DIAGNOSIS — M79622 Pain in left upper arm: Secondary | ICD-10-CM

## 2013-08-01 LAB — CBC WITH DIFFERENTIAL/PLATELET
BASO%: 0.3 % (ref 0.0–2.0)
Basophils Absolute: 0 10*3/uL (ref 0.0–0.1)
EOS%: 0 % (ref 0.0–7.0)
Eosinophils Absolute: 0 10*3/uL (ref 0.0–0.5)
HCT: 34.3 % — ABNORMAL LOW (ref 34.8–46.6)
HGB: 11.3 g/dL — ABNORMAL LOW (ref 11.6–15.9)
LYMPH%: 19.7 % (ref 14.0–49.7)
MCH: 28.5 pg (ref 25.1–34.0)
MCHC: 33 g/dL (ref 31.5–36.0)
MCV: 86.4 fL (ref 79.5–101.0)
MONO#: 0.5 10*3/uL (ref 0.1–0.9)
MONO%: 6 % (ref 0.0–14.0)
NEUT#: 5.9 10*3/uL (ref 1.5–6.5)
NEUT%: 74 % (ref 38.4–76.8)
Platelets: 270 10*3/uL (ref 145–400)
RBC: 3.98 10*6/uL (ref 3.70–5.45)
RDW: 14.6 % — AB (ref 11.2–14.5)
WBC: 7.9 10*3/uL (ref 3.9–10.3)
lymph#: 1.6 10*3/uL (ref 0.9–3.3)

## 2013-08-01 LAB — COMPREHENSIVE METABOLIC PANEL (CC13)
ALBUMIN: 3.7 g/dL (ref 3.5–5.0)
ALK PHOS: 80 U/L (ref 40–150)
ALT: 10 U/L (ref 0–55)
AST: 14 U/L (ref 5–34)
Anion Gap: 15 mEq/L — ABNORMAL HIGH (ref 3–11)
BUN: 13.6 mg/dL (ref 7.0–26.0)
CO2: 19 mEq/L — ABNORMAL LOW (ref 22–29)
Calcium: 10.3 mg/dL (ref 8.4–10.4)
Chloride: 109 mEq/L (ref 98–109)
Creatinine: 0.8 mg/dL (ref 0.6–1.1)
Glucose: 156 mg/dl — ABNORMAL HIGH (ref 70–140)
POTASSIUM: 3.5 meq/L (ref 3.5–5.1)
SODIUM: 143 meq/L (ref 136–145)
Total Bilirubin: <0.2 mg/dL (ref 0.20–1.20)
Total Protein: 7.9 g/dL (ref 6.4–8.3)

## 2013-08-01 MED ORDER — ONDANSETRON 16 MG/50ML IVPB (CHCC)
16.0000 mg | Freq: Once | INTRAVENOUS | Status: AC
  Administered 2013-08-01: 16 mg via INTRAVENOUS

## 2013-08-01 MED ORDER — SODIUM CHLORIDE 0.9 % IV SOLN
881.4000 mg | Freq: Once | INTRAVENOUS | Status: AC
  Administered 2013-08-01: 880 mg via INTRAVENOUS
  Filled 2013-08-01: qty 88

## 2013-08-01 MED ORDER — SODIUM CHLORIDE 0.9 % IJ SOLN
10.0000 mL | INTRAMUSCULAR | Status: DC | PRN
  Administered 2013-08-01: 10 mL
  Filled 2013-08-01: qty 10

## 2013-08-01 MED ORDER — VENLAFAXINE HCL ER 37.5 MG PO CP24: 37.5000 mg | ORAL_CAPSULE | Freq: Every day | ORAL | Status: DC

## 2013-08-01 MED ORDER — SODIUM CHLORIDE 0.9 % IV SOLN
75.0000 mg/m2 | Freq: Once | INTRAVENOUS | Status: AC
  Administered 2013-08-01: 160 mg via INTRAVENOUS
  Filled 2013-08-01: qty 16

## 2013-08-01 MED ORDER — ONDANSETRON 16 MG/50ML IVPB (CHCC)
INTRAVENOUS | Status: AC
  Filled 2013-08-01: qty 16

## 2013-08-01 MED ORDER — DEXAMETHASONE SODIUM PHOSPHATE 20 MG/5ML IJ SOLN
INTRAMUSCULAR | Status: AC
  Filled 2013-08-01: qty 5

## 2013-08-01 MED ORDER — DEXAMETHASONE SODIUM PHOSPHATE 20 MG/5ML IJ SOLN
20.0000 mg | Freq: Once | INTRAMUSCULAR | Status: AC
  Administered 2013-08-01: 20 mg via INTRAVENOUS

## 2013-08-01 MED ORDER — VENLAFAXINE HCL ER 75 MG PO CP24: 75.0000 mg | ORAL_CAPSULE | Freq: Every day | ORAL | Status: DC

## 2013-08-01 MED ORDER — HEPARIN SOD (PORK) LOCK FLUSH 100 UNIT/ML IV SOLN
500.0000 [IU] | Freq: Once | INTRAVENOUS | Status: AC | PRN
  Administered 2013-08-01: 500 [IU]
  Filled 2013-08-01: qty 5

## 2013-08-01 MED ORDER — SODIUM CHLORIDE 0.9 % IV SOLN
Freq: Once | INTRAVENOUS | Status: AC
  Administered 2013-08-01: 11:00:00 via INTRAVENOUS

## 2013-08-01 NOTE — Progress Notes (Signed)
Communicated with pt to continue taking Potassium 20 mEq total daily. She takes 10 mEq tablets twice a day. Also I informed pt that she has been Rx Effexor 37.5 mg tablet to be taken for 7 days and then to start taking the 75 mg tablet of Effexor daily. She will pick up prescriptions today @ [place]. Pt verbalized understanding. No further concerns.

## 2013-08-01 NOTE — Progress Notes (Signed)
[place]  [phone] [phone]     ID: [name] OB: [birth year]  MR#: [id number]  VFI#:[id number]  PCP: Colvin Caroli GYN:   SU: Alphonsa Overall OTHER MD: Lonia Chimera [id number])  CHIEF COMPLAINT:  Hx of Left Breast Cancer with Left Axillary Recurrence/Receiving Chemotherapy   HISTORY OF PRESENT ILLNESS: [name] had bilateral diagnostic mammography [facility] in [place] 11/30/2009 showing a 1.5 cm mass at the 9:00 position of the left breast with some satellites. A biopsy of the mass in question [facility] showed ([id number]) and invasive lobular carcinoma, which was estrogen and progesterone receptor both 100% positive, both with strong staining intensity, but HER-2 negative at 1+. Bilateral breast MRI 12/13/2009 in [place] showed in the left breast an irregularly marginated mass measuring 3.2 cm. There were 4 or 5 separate satellite lesions laterally and superior to the primary.  Accordingly, after appropriate discussion, the patient underwent bilateral mastectomies with left sentinel lymph node sampling 01/02/2010. The right breast was benign. The left breast showed a 2.3 cm invasive lobular carcinoma, grade 3, with a total of 6 sentinel lymph nodes removed, all negative, although 2 showed isolated tumor cells by immunostaining. Margins were negative.  An Oncotype BX was sent, with a recurrence score of 22, predicting a risk of distant recurrence within 10 years with 14% of the patients only systemic treatment was tamoxifen. The patient was treated with CMF chemotherapy between 02/22/2010 and 07/05/2010, receiving 7 cycles at which point the patient refused further chemotherapy though there have been no major toxicities at according to the oncology note from Dr. Brent General. The patient was then started on tamoxifen 08/09/2010. By her cancer took approximately 12-15 months then stopped  because of hot flashes and insomnia problems.  More recently, the patient noted a change in her left axilla andunderwent bilateral breast MRI January to 2015 at [facility]. The patient has bilateral submuscular silicone implants in place. The breast were unremarkable, but there were several lymph nodes with cortical thickening in the left axilla including a dominant 1 measuring 1.2 cm in the short axis. Ultrasonography of this area identified the lymph node in question and the patient underwent biopsy of the left axillary lymph node This showed ([id number]) near-total replacement of the lymph node by metastatic carcinoma. This was 96% estrogen receptor positive, and 84% progesterone receptor positive, both with strong staining intensity. The MIB-1 was 20%. HER-2 determination is pending.  The patient's subsequent history is as detailed below  INTERVAL HISTORY: [name] returns alone today for followup of her left breast cancer. She is due for day 1 cycle 2 of 6 planned q. three-week doses of docetaxel/carboplatin.  She received Neulasta on day 2 for granulocyte support.   Physically, [name] is feeling reasonably well today. She tells me, however, that emotionally, both her anxiety and her depression have worsened. She denies any suicidal ideation. She is interested in discussing oral antidepressants today, and is open to the possibility of counseling as well.  Otherwise, [name] continues to have the pain in her left axilla and in the middle of her back. This pain is stable. She takes tramadol as needed. She's recently treated for an upper respiratory infection which has improved, and her cough has improved by at least 90% she tells me. The cough is still productive of clear phlegm, but she's had no hemoptysis, no increased shortness of breath, and no fevers or chills.  She's eating and drinking well, has  had no nausea, and her diarrhea has completely resolved.    REVIEW OF SYSTEMS: [name]  denies any rashes or skin changes and has had no signs of abnormal bruising or bleeding. She denies any oral ulcerations or problems swallowing, and is keeping herself well hydrated. She's had no change in urinary habits, specifically no hematuria or dysuria. She denies any increased peripheral swelling has had no chest pain, pressure, or palpitations. She denies any abnormal headaches, dizziness, or change in vision. Other than the pain noted above, she's had no unusual myalgias, arthralgias, or bony pain. She also denies any signs of peripheral neuropathy in either the upper or lower extremities.  A detailed review of systems is otherwise stable and noncontributory.   PAST MEDICAL HISTORY: Past Medical History  Diagnosis Date  . Cancer     breast ca    PAST SURGICAL HISTORY: Past Surgical History  Procedure Laterality Date  . Bilateral total mastectomy with axillary lymph node dissection    . Tonsillectomy    . Leep    . Portacath placement N/A 06/16/2013    Procedure: POWER PORT PLACEMENT;  Surgeon: Shann Medal, MD;  Location: [facility];  Service: General;  Laterality: N/A;  . Axillary lymph node dissection Left 06/16/2013    Procedure: LEFT AXILLARY NODE DISSECTION;  Surgeon: Shann Medal, MD;  Location: [facility];  Service: General;  Laterality: Left;    FAMILY HISTORY Family History  Problem Relation Age of Onset  . Other Mother     glioblastoma  . Other Father     glioblastoma   according to the patient both her parents died from a primary brain cancers, namely we have the stomas, her father at 26, her mother at 78. The patient had one brother and one sister. There is no history of breast or ovarian cancer in the family  GYNECOLOGIC HISTORY:   (Updated 08/11/2013) Menarche age 34. The patient has never carried a child to term. She is still having regular periods, most recently second week in January. 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 She shares a home with her significant other, Burnice Logan, who works at installing cisterns. They have no pets.    ADVANCED DIRECTIVES: Not in place   HEALTH MAINTENANCE:  (Updated  08/01/2013) History  Substance Use Topics  . Smoking status: Never Smoker   . Smokeless tobacco: Never Used  . Alcohol Use: No     Colonoscopy: Never  PAP: December 2014  Bone density: Never  Lipid panel: Not on file   Allergies  Allergen Reactions  . Sulfa Antibiotics Swelling  . Gadolinium Derivatives Hives    After gado injection, pt had a few small hives on left breast area. Treated with benadryl by dr Janeece Fitting   . Petrolatum Rash    Current Outpatient Prescriptions  Medication Sig Dispense Refill  . dexamethasone (DECADRON) 4 MG tablet 2 tabs by mouth with food twice daily on day before and 3 days after each chemo  45 tablet  1  . lidocaine-prilocaine (EMLA) cream Apply 1 application topically as needed. 1-2 hours before each port access  30 g  2  . LORazepam (ATIVAN) 0.5 MG tablet Take 1 tablet (0.5 mg total) by mouth at bedtime as needed for anxiety or sleep (or nausea).  30 tablet  0  . metaxalone (SKELAXIN) 800  MG tablet Take 1 tablet (800 mg total) by mouth 3 (three) times daily as needed for muscle spasms.  60 tablet  0  . naproxen sodium (ANAPROX) 220 MG tablet Take 440 mg by mouth 2 (two) times daily as needed (pain).      Marland Kitchen omeprazole (PRILOSEC) 40 MG capsule Take 1 capsule (40 mg total) by mouth daily.  90 capsule  3  . pantoprazole (PROTONIX) 40 MG tablet Take 1 tablet (40 mg total) by mouth daily.  30 tablet  0  . potassium chloride (MICRO-K) 10 MEQ CR capsule Take 1 capsule (10 mEq total) by mouth 2 (two) times daily.  60 capsule  2  . traMADol (ULTRAM) 50 MG tablet Take 1 tablet (50 mg total) by mouth every 6 (six) hours as needed for moderate pain.  60 tablet   0  . cholestyramine (QUESTRAN) 4 G packet Take 1 packet (4 g total) by mouth 2 (two) times daily as needed. For diarrhea (Take with food)  30 each  0  . dicyclomine (BENTYL) 10 MG capsule Take 1 capsule (10 mg total) by mouth 4 (four) times daily -  before meals and at bedtime.  60 capsule  1  . HYDROcodone-acetaminophen (NORCO/VICODIN) 5-325 MG per tablet Take 1-2 tablets by mouth every 6 (six) hours as needed.  30 tablet  0  . ondansetron (ZOFRAN) 4 MG tablet Take 1 tablet (4 mg total) by mouth every 6 (six) hours as needed for nausea or vomiting.  20 tablet  0  . ondansetron (ZOFRAN) 8 MG tablet 1 tab by mouth twice daily x 3 days after each chemo, then 1 tab by mouth every 12 hrs if needed for nausea  30 tablet  1  . prochlorperazine (COMPAZINE) 10 MG tablet 1 tab by mouth with meals and bedtime x 3 days after chemo, then 1 tab by mouth every 6 hrs if needed for nausea  30 tablet  2  . prochlorperazine (COMPAZINE) 25 MG suppository Place 1 suppository (25 mg total) rectally every 12 (twelve) hours as needed for nausea or vomiting.  12 suppository  0  . venlafaxine XR (EFFEXOR-XR) 37.5 MG 24 hr capsule Take 1 capsule (37.5 mg total) by mouth daily with breakfast.  7 capsule  0  . venlafaxine XR (EFFEXOR-XR) 75 MG 24 hr capsule Take 1 capsule (75 mg total) by mouth daily with breakfast. Begin 08/08/2013, after taking 37.5 mg dose x 7 days  30 capsule  3   No current facility-administered medications for this visit.   Facility-Administered Medications Ordered in Other Visits  Medication Dose Route Frequency Provider Last Rate Last Dose  . sodium chloride 0.9 % injection 10 mL  10 mL Intracatheter PRN Theotis Burrow, PA-C   10 mL at 08/01/13 1327    OBJECTIVE: Young African American Bell who appears  depressed  but is in no acute distress Filed Vitals:   08/01/13 0958  BP: 141/89  Pulse: 108  Temp: 98.7 F (37.1 C)  Resp: 18     Body mass index is 31.9 kg/(m^2).    ECOG FS:1 - Symptomatic but  completely ambulatory Filed Weights   08/01/13 0958  Weight: 203 lb 11.2 oz (92.398 kg)   Physical Exam: HEENT:  Sclerae anicteric.  Oropharynx clear and moist. No  ulcerations, mucositis, or candidiasis. Neck is supple, trachea midline.  NODES:  No cervical or supraclavicular lymphadenopathy palpated.  BREAST EXAM:  Deferred. No axillary lymphadenopathy. LUNGS:  Clear to auscultation bilaterally  with good excursion.  No  crackles, wheezes or rhonchi HEART:  Regular rate and rhythm. No murmur Appreciated.  ABDOMEN:  Soft, tender to palpation, with no guarding or rebound tenderness.    Positive bowel sounds.  MSK:  No focal spinal tenderness to palpation.  Some limited range of motion in the left upper extremity due to discomfort.  EXTREMITIES:  No peripheral edema.  No lymphedema in the upper extremities. SKIN:  Benign with no visible rashes or skin lesions. No excessive ecchymoses or petechiae.  NEURO:  Nonfocal. Well oriented.   Depressed,  but appropriate affect.    LAB RESULTS:   Lab Results  Component Value Date   WBC 7.9 08/01/2013   NEUTROABS 5.9 08/01/2013   HGB 11.3* 08/01/2013   HCT 34.3* 08/01/2013   MCV 86.4 08/01/2013   PLT 270 08/01/2013      Chemistry      Component Value Date/Time   NA 143 08/01/2013 0951   NA 135* 07/16/2013 0535   K 3.5 08/01/2013 0951   K 4.4 07/16/2013 0535   CL 97 07/16/2013 0535   CO2 19* 08/01/2013 0951   CO2 25 07/16/2013 0535   BUN 13.6 08/01/2013 0951   BUN 11 07/16/2013 0535   CREATININE 0.8 08/01/2013 0951   CREATININE 0.81 07/16/2013 0535      Component Value Date/Time   CALCIUM 10.3 08/01/2013 0951   CALCIUM 10.1 07/16/2013 0535   ALKPHOS 80 08/01/2013 0951   ALKPHOS 92 07/16/2013 0535   AST 14 08/01/2013 0951   AST 16 07/16/2013 0535   ALT 10 08/01/2013 0951   ALT 14 07/16/2013 0535   BILITOT <0.20 08/01/2013 0951   BILITOT 0.9 07/16/2013 0535       STUDIES: No results found.   ASSESSMENT: 38 y.o. [name]  (1) status post bilateral  mastectomies with left axillary lymph node sampling [6 nodes removed] 01/02/2010 for a pT2 pN0(i+), stage IIA invasive lobular breast cancer, grade 3, estrogen and progesterone receptor positive, HER-2 not amplified; right breast was benign  (2) Oncotype DX score of 22 predicted a 14% risk of distant recurrence within 10 years if the patient's only systemic treatment is tamoxifen for 5 years  (3) status post CMF(cyclophosphamide, fluorouracil, methotrexate) x7 given between October of 2011 and March of 2012 (7 of 8 planned treatments completed)  (4) tamoxifen started April 2012, discontinued approximately July of 2013 (about 15 months) because of problems with hot flashes and insomnia  (5) pathologically documented left axillary recurrence 05/05/2013, the tumor being again estrogen and progesterone receptor positive, with HER-2 not amplified, and MIB-1 of 15%. Staging CT/PET 05/27/2013 show left axillary and supraclavicular recurrence, no distant disease  (6) status post completion left axillary lymph node dissection 06/16/2013 showing a total of 10/15 lymph nodes involved by tumor, with evidence of extracapsular extension (TX N3 = stage IIIC)  (7) being treated with carboplatin/ docetaxel, first dose on 07/11/2013, to be repeated every 21 days x 6 with Neulasta support on day 2  (8) radiation to follow chemotherapy  (9) bilateral salpingo-oophorectomy later this year, followed by aromatase inhibitor therapy for at least 5 years  (10)  depression, to be treated with Effexor XR plus counseling   PLAN: [name]   will proceed with treatment today as scheduled for her second cycle of docetaxel/carboplatin. She'll return tomorrow for her Neulasta injection, and I will see her again next week on April 17 for assessment of chemotoxicity. We will continue to follow her very  closely with labs and physical exam a regular basis. We'll continue to assess for any signs of peripheral neuropathy.   Krystel's  potassium is within normal range today at 3.5, and she'll continue on her current dose of oral potassium, 10 mEq twice daily.  We discussed the possibility of starting [name] on Celexa, but she uses tramadol for pain, so we chose Effexor XR instead. Per normal dosing, she will take 37.5 mg of the Effexor XR for 7 days, then increase to 75 mg. We'll reassess in 30 days and can certainly increase if necessary, but hopefully this will control her depression. She's also spoken with our social worker, Vernie Shanks, today regarding counseling.  Nanako voices her understanding and agreement with the above plan.  She will call with any changes or problems prior to her appointment with me next week.   Katonya Blecher, PA-C   08/01/2013 2:46 PM

## 2013-08-01 NOTE — Telephone Encounter (Signed)
, °

## 2013-08-01 NOTE — Patient Instructions (Signed)
[facility] Discharge Instructions for Patients Receiving Chemotherapy  Today you received the following chemotherapy agents Taxotere/Carboplatin.  To help prevent nausea and vomiting after your treatment, we encourage you to take your nausea medication as prescribed.   If you develop nausea and vomiting that is not controlled by your nausea medication, call the clinic.   BELOW ARE SYMPTOMS THAT SHOULD BE REPORTED IMMEDIATELY:  *FEVER GREATER THAN 100.5 F  *CHILLS WITH OR WITHOUT FEVER  NAUSEA AND VOMITING THAT IS NOT CONTROLLED WITH YOUR NAUSEA MEDICATION  *UNUSUAL SHORTNESS OF BREATH  *UNUSUAL BRUISING OR BLEEDING  TENDERNESS IN MOUTH AND THROAT WITH OR WITHOUT PRESENCE OF ULCERS  *URINARY PROBLEMS  *BOWEL PROBLEMS  UNUSUAL RASH Items with * indicate a potential emergency and should be followed up as soon as possible.  Feel free to call the clinic you have any questions or concerns. The clinic phone number is [phone].    

## 2013-08-01 NOTE — Progress Notes (Signed)
[place] [facility]  Clinical Social Work met with pt in the infusion room to follow up and offer continued support.  Pt stated she was feeling "better" physically, but was still struggling emotionally.  CSW offer pt support and discussed support resources.  CSW and pt discussed interventions and coping strategies.  Pt expressed interest in counseling options, and CSW and pt discussed options available.  CSW will follow up with pt to schedule counseling appointment.  CSW will continue to follow and support pt as needed.     Johnnye Lana, MSW, [place] Worker [facility] [phone]

## 2013-08-02 ENCOUNTER — Ambulatory Visit (HOSPITAL_BASED_OUTPATIENT_CLINIC_OR_DEPARTMENT_OTHER): Payer: 59

## 2013-08-02 VITALS — BP 144/93 | HR 85 | Temp 98.4°F

## 2013-08-02 DIAGNOSIS — C50919 Malignant neoplasm of unspecified site of unspecified female breast: Secondary | ICD-10-CM

## 2013-08-02 DIAGNOSIS — Z5189 Encounter for other specified aftercare: Secondary | ICD-10-CM

## 2013-08-02 DIAGNOSIS — C773 Secondary and unspecified malignant neoplasm of axilla and upper limb lymph nodes: Secondary | ICD-10-CM

## 2013-08-02 MED ORDER — PEGFILGRASTIM INJECTION 6 MG/0.6ML
6.0000 mg | Freq: Once | SUBCUTANEOUS | Status: AC
Start: 2013-08-02 — End: 2013-08-02
  Administered 2013-08-02: 6 mg via SUBCUTANEOUS
  Filled 2013-08-02: qty 0.6

## 2013-08-03 ENCOUNTER — Encounter: Payer: Self-pay | Admitting: Physician Assistant

## 2013-08-03 NOTE — Telephone Encounter (Signed)
Attempted to call patient back, no answer. Left instructions to call oncall MD if she felt her need was emergent.

## 2013-08-04 ENCOUNTER — Other Ambulatory Visit: Payer: Self-pay | Admitting: Physician Assistant

## 2013-08-04 ENCOUNTER — Encounter: Payer: Self-pay | Admitting: Oncology

## 2013-08-04 ENCOUNTER — Encounter: Payer: Self-pay | Admitting: *Deleted

## 2013-08-04 ENCOUNTER — Other Ambulatory Visit: Payer: Self-pay | Admitting: *Deleted

## 2013-08-04 ENCOUNTER — Ambulatory Visit (HOSPITAL_COMMUNITY)
Admission: RE | Admit: 2013-08-04 | Discharge: 2013-08-04 | Disposition: A | Discharge status: Home / Self Care | Payer: 59 | Source: Ambulatory Visit | Attending: Physician Assistant | Admitting: Physician Assistant

## 2013-08-04 DIAGNOSIS — C773 Secondary and unspecified malignant neoplasm of axilla and upper limb lymph nodes: Principal | ICD-10-CM

## 2013-08-04 DIAGNOSIS — K5900 Constipation, unspecified: Secondary | ICD-10-CM

## 2013-08-04 DIAGNOSIS — C50919 Malignant neoplasm of unspecified site of unspecified female breast: Secondary | ICD-10-CM

## 2013-08-04 DIAGNOSIS — C779 Secondary and unspecified malignant neoplasm of lymph node, unspecified: Secondary | ICD-10-CM | POA: Insufficient documentation

## 2013-08-04 MED ORDER — UNABLE TO FIND: Status: DC

## 2013-08-04 NOTE — Telephone Encounter (Signed)
ENCOUNTER OPENED IN ERROR

## 2013-08-07 ENCOUNTER — Encounter: Payer: Self-pay | Admitting: Physician Assistant

## 2013-08-10 ENCOUNTER — Ambulatory Visit: Payer: 59

## 2013-08-10 ENCOUNTER — Ambulatory Visit (HOSPITAL_BASED_OUTPATIENT_CLINIC_OR_DEPARTMENT_OTHER): Payer: 59 | Admitting: Physician Assistant

## 2013-08-10 ENCOUNTER — Telehealth: Payer: Self-pay | Admitting: Physician Assistant

## 2013-08-10 ENCOUNTER — Other Ambulatory Visit (HOSPITAL_BASED_OUTPATIENT_CLINIC_OR_DEPARTMENT_OTHER): Payer: 59

## 2013-08-10 ENCOUNTER — Encounter: Payer: Self-pay | Admitting: Physician Assistant

## 2013-08-10 ENCOUNTER — Other Ambulatory Visit: Payer: 59

## 2013-08-10 VITALS — BP 130/79 | HR 83 | Temp 98.3°F | Resp 18 | Ht 67.0 in | Wt 206.5 lb

## 2013-08-10 DIAGNOSIS — C50919 Malignant neoplasm of unspecified site of unspecified female breast: Secondary | ICD-10-CM

## 2013-08-10 DIAGNOSIS — F32A Depression, unspecified: Secondary | ICD-10-CM

## 2013-08-10 DIAGNOSIS — C773 Secondary and unspecified malignant neoplasm of axilla and upper limb lymph nodes: Secondary | ICD-10-CM

## 2013-08-10 DIAGNOSIS — R3915 Urgency of urination: Secondary | ICD-10-CM

## 2013-08-10 DIAGNOSIS — D649 Anemia, unspecified: Secondary | ICD-10-CM

## 2013-08-10 DIAGNOSIS — R197 Diarrhea, unspecified: Secondary | ICD-10-CM

## 2013-08-10 DIAGNOSIS — M79622 Pain in left upper arm: Secondary | ICD-10-CM

## 2013-08-10 DIAGNOSIS — F3289 Other specified depressive episodes: Secondary | ICD-10-CM

## 2013-08-10 DIAGNOSIS — F329 Major depressive disorder, single episode, unspecified: Secondary | ICD-10-CM

## 2013-08-10 DIAGNOSIS — R109 Unspecified abdominal pain: Secondary | ICD-10-CM

## 2013-08-10 DIAGNOSIS — R1013 Epigastric pain: Secondary | ICD-10-CM

## 2013-08-10 LAB — CBC WITH DIFFERENTIAL/PLATELET
BASO%: 0.2 % (ref 0.0–2.0)
BASOS ABS: 0 10*3/uL (ref 0.0–0.1)
EOS ABS: 0 10*3/uL (ref 0.0–0.5)
EOS%: 0.1 % (ref 0.0–7.0)
HEMATOCRIT: 32.2 % — AB (ref 34.8–46.6)
HEMOGLOBIN: 10.2 g/dL — AB (ref 11.6–15.9)
LYMPH%: 23.4 % (ref 14.0–49.7)
MCH: 27.4 pg (ref 25.1–34.0)
MCHC: 31.8 g/dL (ref 31.5–36.0)
MCV: 86.1 fL (ref 79.5–101.0)
MONO#: 0.9 10*3/uL (ref 0.1–0.9)
MONO%: 7.1 % (ref 0.0–14.0)
NEUT%: 69.2 % (ref 38.4–76.8)
NEUTROS ABS: 8.9 10*3/uL — AB (ref 1.5–6.5)
PLATELETS: 194 10*3/uL (ref 145–400)
RBC: 3.74 10*6/uL (ref 3.70–5.45)
RDW: 14.5 % (ref 11.2–14.5)
WBC: 12.9 10*3/uL — ABNORMAL HIGH (ref 3.9–10.3)
lymph#: 3 10*3/uL (ref 0.9–3.3)

## 2013-08-10 LAB — COMPREHENSIVE METABOLIC PANEL (CC13)
ALK PHOS: 91 U/L (ref 40–150)
ALT: 19 U/L (ref 0–55)
AST: 19 U/L (ref 5–34)
Albumin: 3.5 g/dL (ref 3.5–5.0)
Anion Gap: 7 mEq/L (ref 3–11)
BUN: 8.4 mg/dL (ref 7.0–26.0)
CO2: 27 mEq/L (ref 22–29)
CREATININE: 0.8 mg/dL (ref 0.6–1.1)
Calcium: 9.7 mg/dL (ref 8.4–10.4)
Chloride: 109 mEq/L (ref 98–109)
GLUCOSE: 102 mg/dL (ref 70–140)
Potassium: 4 mEq/L (ref 3.5–5.1)
Sodium: 142 mEq/L (ref 136–145)
Total Protein: 6.8 g/dL (ref 6.4–8.3)

## 2013-08-10 NOTE — Telephone Encounter (Signed)
per pof sch appts/printed copy of sch and gave to pt

## 2013-08-10 NOTE — Progress Notes (Signed)
[facility]  Telephone:([phone] Fax:([phone]) [phone]     ID: [name] OB: [birth year]  MR#: [id number]  YWV#:[id number]  PCP: Colvin Caroli GYN:   SU: Alphonsa Overall OTHER MD: Lonia Chimera [phone])  CHIEF COMPLAINT:  Hx of Left Breast Cancer with Left Axillary Recurrence/Receiving Chemotherapy   HISTORY OF PRESENT ILLNESS: [name] had bilateral diagnostic mammography [facility] in [place] 11/30/2009 showing a 1.5 cm mass at the 9:00 position of the left breast with some satellites. A biopsy of the mass in question [facility] showed ([id number]) and invasive lobular carcinoma, which was estrogen and progesterone receptor both 100% positive, both with strong staining intensity, but HER-2 negative at 1+. Bilateral breast MRI 12/13/2009 in [place] showed in the left breast an irregularly marginated mass measuring 3.2 cm. There were 4 or 5 separate satellite lesions laterally and superior to the primary.  Accordingly, after appropriate discussion, the patient underwent bilateral mastectomies with left sentinel lymph node sampling 01/02/2010. The right breast was benign. The left breast showed a 2.3 cm invasive lobular carcinoma, grade 3, with a total of 6 sentinel lymph nodes removed, all negative, although 2 showed isolated tumor cells by immunostaining. Margins were negative.  An Oncotype BX was sent, with a recurrence score of 22, predicting a risk of distant recurrence within 10 years with 14% of the patients only systemic treatment was tamoxifen. The patient was treated with CMF chemotherapy between 02/22/2010 and 07/05/2010, receiving 7 cycles at which point the patient refused further chemotherapy though there have been no major toxicities at according to the oncology note from Dr. Brent General. The patient was then started on tamoxifen 08/09/2010. By her cancer took approximately 12-15 months then stopped  because of hot flashes and insomnia problems.  More recently, the patient noted a change in her left axilla andunderwent bilateral breast MRI January to 2015 at [place]. The patient has bilateral submuscular silicone implants in place. The breast were unremarkable, but there were several lymph nodes with cortical thickening in the left axilla including a dominant 1 measuring 1.2 cm in the short axis. Ultrasonography of this area identified the lymph node in question and the patient underwent biopsy of the left axillary lymph node This showed ([id number]) near-total replacement of the lymph node by metastatic carcinoma. This was 96% estrogen receptor positive, and 84% progesterone receptor positive, both with strong staining intensity. The MIB-1 was 20%. HER-2 determination is pending.  The patient's subsequent history is as detailed below  INTERVAL HISTORY: [name] returns alone today for followup of her left breast cancer. She is now day 10 cycle 2 of 6 planned q. three-week doses of docetaxel/carboplatin.  She received Neulasta on day 2 for granulocyte support.  [name] has had a very rough week. She has had more GI symptoms with cycle 2. She continues on Bentyl prior to each meal, but has basically stopped eating solid foods. She is able to drink, is keeping herself well hydrated, and is using boost to maintain her weight. If she eats solid food, however, she immediately experiences abdominal pain (primarily in the left side) followed by loose stools.  Fortunately, nausea has not been an issue. She did take all of her nausea medications appropriately following treatment.  She continues to feel  down and depressed. She denies suicidal ideation. She did start on venlafaxine last week, and just increased on Monday from her 37.5 dose up to 75 mg daily. She has not yet been  noticing an improvement in her depression. Of course feeling poorly like she has over the past week has only added to her  emotional distress, and today she feels unsure if she can continue with this chemotherapy.  REVIEW OF SYSTEMS: [name] denies fevers, chills, or night sweats. She does have some difficulty sleeping at times, and she does feel tired. She's had no mouth ulcers or oral sensitivity. She denies any rashes or skin changes, and also denies any abnormal bruising. She has had some slight rectal bleeding associated with a hemorrhoid, but this has been mild. She denies any dysuria or hematuria. Her cough has resolved, and she's had no phlegm production, pleurisy, increased shortness of breath, or chest pain. She occasionally feels palpitations, especially when she is taking her dexamethasone. She's had some occasional headaches but no dizziness and no change in vision. She continues to have pain in the left axilla, radiating around into the left upper back. She denies any additional myalgias, arthralgias, or bony pain, and has had no signs of peripheral neuropathy.   A detailed review of systems is otherwise stable and noncontributory.   PAST MEDICAL HISTORY: Past Medical History  Diagnosis Date  . Cancer     breast ca    PAST SURGICAL HISTORY: Past Surgical History  Procedure Laterality Date  . Bilateral total mastectomy with axillary lymph node dissection    . Tonsillectomy    . Leep    . Portacath placement N/A 06/16/2013    Procedure: POWER PORT PLACEMENT;  Surgeon: Shann Medal, MD;  Location: [facility];  Service: General;  Laterality: N/A;  . Axillary lymph node dissection Left 06/16/2013    Procedure: LEFT AXILLARY NODE DISSECTION;  Surgeon: Shann Medal, MD;  Location: [facility];  Service: General;  Laterality: Left;    FAMILY HISTORY Family History  Problem Relation Age of Onset  . Other Mother     glioblastoma  . Other Father     glioblastoma   according to the patient both her parents died from a primary brain cancers, namely we have the stomas, her father at 83, her mother at 55. The  patient had one brother and one sister. There is no history of breast or ovarian cancer in the family  GYNECOLOGIC HISTORY:   (Updated 08/10/2013) Menarche age 27. The patient has never carried a child to term. She is still having regular periods, most recently second week in January. 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 She shares a home with her significant other, [name], who works at installing cisterns. They have no pets.    ADVANCED DIRECTIVES: Not in place   HEALTH MAINTENANCE:  (Updated  08/10/2013) History  Substance Use Topics  . Smoking status: Never Smoker   . Smokeless tobacco: Never Used  . Alcohol Use: No     Colonoscopy: Never  PAP: December 2014  Bone density: Never  Lipid panel: Not on file   Allergies  Allergen Reactions  . Sulfa Antibiotics Swelling  . Gadolinium Derivatives Hives    After gado injection, pt had a few small hives on left breast area. Treated with benadryl by dr Janeece Fitting   . Petrolatum Rash    Current Outpatient Prescriptions  Medication Sig Dispense Refill  . dicyclomine (BENTYL) 10 MG capsule Take 1 capsule (10 mg total) by mouth 4 (four) times daily -  before meals and at bedtime.  60 capsule  1  . lidocaine-prilocaine (EMLA) cream Apply 1 application topically as needed. 1-2 hours before each port access  30 g  2  . metaxalone (SKELAXIN) 800 MG tablet Take 1 tablet (800 mg total) by mouth 3 (three) times daily as needed for muscle spasms.  60 tablet  0  . omeprazole (PRILOSEC) 40 MG capsule Take 1 capsule (40 mg total) by mouth daily.  90 capsule  3  . pantoprazole (PROTONIX) 40 MG tablet Take 1 tablet (40 mg total) by mouth daily.  30 tablet  0  . potassium chloride (MICRO-K) 10 MEQ CR capsule Take 1 capsule (10 mEq total) by mouth 2 (two) times daily.  60 capsule  2  .  traMADol (ULTRAM) 50 MG tablet Take 1 tablet (50 mg total) by mouth every 6 (six) hours as needed for moderate pain.  60 tablet  0  . 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 venlafaxine XR (EFFEXOR-XR) 37.5 MG 24 hr capsule Take 1 capsule (37.5 mg total) by mouth daily with breakfast.  7 capsule  0  . venlafaxine XR (EFFEXOR-XR) 75 MG 24 hr capsule Take 1 capsule (75 mg total) by mouth daily with breakfast. Begin 08/08/2013, after taking 37.5 mg dose x 7 days  30 capsule  3  . cholestyramine (QUESTRAN) 4 G packet Take 1 packet (4 g total) by mouth 2 (two) times daily as needed. For diarrhea (Take with food)  30 each  0  . dexamethasone (DECADRON) 4 MG tablet 2 tabs by mouth with food twice daily on day before and 3 days after each chemo  45 tablet  1  . HYDROcodone-acetaminophen (NORCO/VICODIN) 5-325 MG per tablet Take 1-2 tablets by mouth every 6 (six) hours as needed.  30 tablet  0  . LORazepam (ATIVAN) 0.5 MG tablet Take 1 tablet (0.5 mg total) by mouth at bedtime as needed for anxiety or sleep (or nausea).  30 tablet  0  . naproxen sodium (ANAPROX) 220 MG tablet Take 440 mg by mouth 2 (two) times daily as needed (pain).      . ondansetron (ZOFRAN) 4 MG tablet Take 1 tablet (4 mg total) by mouth every 6 (six) hours as needed for nausea or vomiting.  20 tablet  0  . ondansetron (ZOFRAN) 8 MG tablet 1 tab by mouth twice daily x 3 days after each chemo, then 1 tab by mouth every 12 hrs if needed for nausea  30 tablet  1  . prochlorperazine (COMPAZINE) 10 MG tablet 1 tab by mouth with meals and bedtime x 3 days after chemo, then 1 tab by mouth every 6 hrs if needed for nausea  30 tablet  2  . prochlorperazine (COMPAZINE) 25 MG suppository Place 1 suppository (25 mg total) rectally every 12 (twelve) hours as needed for nausea or vomiting.  12 suppository  0   No current facility-administered medications for this visit.     OBJECTIVE: Young African American woman who appears  depressed and tired  Filed Vitals:   08/10/13 0949  BP: 130/79  Pulse: 83  Temp: 98.3 F (36.8 C)  Resp: 18     Body mass index is 32.33 kg/(m^2).    ECOG FS:1 - Symptomatic but completely ambulatory Filed Weights   08/10/13 0949  Weight: 206 lb 8 oz (93.668 kg)   Physical Exam: HEENT:  Sclerae anicteric.  Oropharynx clear and moist. No  mucositis or candidiasis. No pharyngeal erythema. Neck is supple, trachea midline.  NODES:  No cervical or supraclavicular lymphadenopathy palpated.  BREAST EXAM:  Deferred. Axillae are benign bilaterally with no palpable lymphadenopathy.  LUNGS:  Clear to auscultation bilaterally with good excursion.  No wheezes or rhonchi HEART:  Regular rate and rhythm. No murmur appreciated.  ABDOMEN:  Soft, nondistended.  Nontender to palpation, including palpation of the left upper and lower quadrants. No guarding or rebound tenderness.    Positive and normoactive bowel sounds.  MSK:  No focal spinal tenderness to palpation.  Some limited range of motion in the left upper extremity due to discomfort.  EXTREMITIES:  No peripheral edema.  No lymphedema in the upper extremities. SKIN:  Benign with no visible rashes or skin lesions. No excessive ecchymoses or petechiae. No pallor NEURO:  Nonfocal. Well oriented.   Depressed,  but appropriate affect.    LAB RESULTS:   Lab Results  Component Value Date   WBC 12.9* 08/10/2013   NEUTROABS 8.9* 08/10/2013   HGB 10.2* 08/10/2013   HCT 32.2* 08/10/2013   MCV 86.1 08/10/2013   PLT 194 08/10/2013      Chemistry      Component Value Date/Time   NA 142 08/10/2013 0926   NA 135* 07/16/2013 0535   K 4.0 08/10/2013 0926   K 4.4 07/16/2013 0535   CL 97 07/16/2013 0535   CO2 27 08/10/2013 0926   CO2 25 07/16/2013 0535   BUN 8.4 08/10/2013 0926   BUN 11 07/16/2013 0535   CREATININE 0.8 08/10/2013 0926   CREATININE 0.81 07/16/2013 0535      Component Value Date/Time    CALCIUM 9.7 08/10/2013 0926   CALCIUM 10.1 07/16/2013 0535   ALKPHOS 91 08/10/2013 0926   ALKPHOS 92 07/16/2013 0535   AST 19 08/10/2013 0926   AST 16 07/16/2013 0535   ALT 19 08/10/2013 0926   ALT 14 07/16/2013 0535   BILITOT <0.20 08/10/2013 0926   BILITOT 0.9 07/16/2013 0535       STUDIES: Dg Abd 1 View 08/04/2013   CLINICAL DATA:  38 year old female with breast cancer with lymph node involvement. No bowel movement x3 days, constipation. Initial encounter.  EXAM: ABDOMEN - 1 VIEW  COMPARISON:  PET-CT 05/27/2013.  FINDINGS: Supine KUB. Non obstructed bowel gas pattern. Volume of retained stool in the colon does appear increased from the comparison. Abdominal and pelvic visceral contours are within normal limits. Proximal right femur ORIF hardware re- identified. Stable visualized osseous structures.  IMPRESSION: Non obstructed bowel gas pattern, but with increased volume of retained stool in the colon.   Electronically Signed   By: Lars Pinks M.D.   On: 08/04/2013 11:59     ASSESSMENT: 37 y.o. [place] woman  (1) status post bilateral mastectomies with left axillary lymph node sampling [6 nodes removed] 01/02/2010 for a pT2 pN0(i+), stage IIA invasive lobular breast cancer, grade 3, estrogen and progesterone receptor positive, HER-2 not amplified; right breast was benign  (2) Oncotype DX score of 22 predicted a 14% risk of distant recurrence within 10 years if the patient's only systemic treatment is tamoxifen for 5 years  (3) status post CMF(cyclophosphamide, fluorouracil, methotrexate) x7 given between October of 2011 and March of 2012 (7 of 8 planned treatments completed)  (4) tamoxifen started April 2012, discontinued approximately July of 2013 (about 15 months) because of problems with hot flashes and insomnia  (5) pathologically documented left axillary recurrence  05/05/2013, the tumor being again estrogen and progesterone receptor positive, with HER-2 not amplified, and MIB-1 of 15%.  Staging CT/PET 05/27/2013 show left axillary and supraclavicular recurrence, no distant disease  (6) status post completion left axillary lymph node dissection 06/16/2013 showing a total of 10/15 lymph nodes involved by tumor, with evidence of extracapsular extension (TX N3 = stage IIIC)  (7) being treated with carboplatin/ docetaxel, first dose on 07/11/2013, to be repeated every 21 days x 6 with Neulasta support on day 2  (8) radiation to follow chemotherapy  (9) bilateral salpingo-oophorectomy later this year, followed by aromatase inhibitor therapy for at least 5 years  (10)  depression, being treated with Effexor XR plus counseling  (11) GI symptoms, consistent with IBS    PLAN: The majority of our 35 minute appointment today was spent reviewing Rogelio's many concerns, counseling her with regards to her side effects, discussing some treatment options, and coordinating care.   Jannis agrees to "get through the next couple of weeks" and reevaluate when she returns on April 27 to discuss proceeding with her third cycle of docetaxel/carboplatin. Until then, we're going to try to make some changes and see if we can help improve her GI symptoms which are actually very consistent with IBS.  Since the Bentyl could actually be adding to her urgency and diarrhea, she is going to hold that for the next 24 hours. I encouraged her to eat very bland meals, following basically a BRAT diet, and to take Gas-X every 6 hours throughout the day. We will contact her tomorrow to followup and see if her abdominal pain and loose stools have improved at all.   Of note, we actually tested Levada Dy for C. difficile in late March with similar complaints, and the test was negative, so I'm not especially suspicious that that is the cause and I'm not going to order a repeat test at this time.  I will continue to follow Maelani very closely, and I plan to see her back in 2 weeks, April 27, to discuss cycle 3 of therapy.   Elesa voices her understanding and agreement with the above plan.  She will call with any changes or problems prior to her next appointment.   Theotis Burrow, PA-C   08/10/2013 1:13 PM

## 2013-08-10 NOTE — Telephone Encounter (Signed)
Patient met with provider today and discussed this information.

## 2013-08-11 ENCOUNTER — Telehealth: Payer: Self-pay | Admitting: *Deleted

## 2013-08-11 NOTE — Telephone Encounter (Signed)
Called pt to F/U concerning symptoms she was having yesterday. Pt is now feeling better, having soft- formed BM's and able to eat solids without experiencing abdominal pain. Pt followed instructions given by Micah Flesher, PA-C. Pt stated," I have not had any pain since yesterday morning in my stomach". Advised pt to continue the BRAT diet and to call us if she has any problems. Pt verbalized understanding. Message to be forwarded to [employer], PA-C.

## 2013-08-22 ENCOUNTER — Other Ambulatory Visit (HOSPITAL_BASED_OUTPATIENT_CLINIC_OR_DEPARTMENT_OTHER): Payer: 59

## 2013-08-22 ENCOUNTER — Ambulatory Visit (HOSPITAL_BASED_OUTPATIENT_CLINIC_OR_DEPARTMENT_OTHER): Payer: 59 | Admitting: Physician Assistant

## 2013-08-22 ENCOUNTER — Encounter: Payer: Self-pay | Admitting: Physician Assistant

## 2013-08-22 ENCOUNTER — Ambulatory Visit (HOSPITAL_BASED_OUTPATIENT_CLINIC_OR_DEPARTMENT_OTHER): Payer: 59

## 2013-08-22 VITALS — BP 135/87 | HR 101 | Temp 98.9°F | Resp 20 | Ht 67.0 in | Wt 209.0 lb

## 2013-08-22 DIAGNOSIS — F3289 Other specified depressive episodes: Secondary | ICD-10-CM

## 2013-08-22 DIAGNOSIS — Z170 Estrogen receptor positive status [ER+]: Secondary | ICD-10-CM

## 2013-08-22 DIAGNOSIS — C773 Secondary and unspecified malignant neoplasm of axilla and upper limb lymph nodes: Secondary | ICD-10-CM

## 2013-08-22 DIAGNOSIS — F329 Major depressive disorder, single episode, unspecified: Secondary | ICD-10-CM

## 2013-08-22 DIAGNOSIS — C50919 Malignant neoplasm of unspecified site of unspecified female breast: Secondary | ICD-10-CM

## 2013-08-22 DIAGNOSIS — J309 Allergic rhinitis, unspecified: Secondary | ICD-10-CM

## 2013-08-22 DIAGNOSIS — Z5111 Encounter for antineoplastic chemotherapy: Secondary | ICD-10-CM

## 2013-08-22 DIAGNOSIS — F32A Depression, unspecified: Secondary | ICD-10-CM

## 2013-08-22 DIAGNOSIS — M79622 Pain in left upper arm: Secondary | ICD-10-CM

## 2013-08-22 DIAGNOSIS — D649 Anemia, unspecified: Secondary | ICD-10-CM

## 2013-08-22 LAB — COMPREHENSIVE METABOLIC PANEL (CC13)
ALT: 28 U/L (ref 0–55)
ANION GAP: 10 meq/L (ref 3–11)
AST: 25 U/L (ref 5–34)
Albumin: 3.5 g/dL (ref 3.5–5.0)
Alkaline Phosphatase: 73 U/L (ref 40–150)
BUN: 14.8 mg/dL (ref 7.0–26.0)
CALCIUM: 10.3 mg/dL (ref 8.4–10.4)
CHLORIDE: 109 meq/L (ref 98–109)
CO2: 23 meq/L (ref 22–29)
Creatinine: 0.9 mg/dL (ref 0.6–1.1)
Glucose: 127 mg/dl (ref 70–140)
Potassium: 3.6 mEq/L (ref 3.5–5.1)
SODIUM: 142 meq/L (ref 136–145)
TOTAL PROTEIN: 7.1 g/dL (ref 6.4–8.3)
Total Bilirubin: 0.38 mg/dL (ref 0.20–1.20)

## 2013-08-22 LAB — CBC WITH DIFFERENTIAL/PLATELET
BASO%: 0.7 % (ref 0.0–2.0)
Basophils Absolute: 0 10*3/uL (ref 0.0–0.1)
EOS%: 0.8 % (ref 0.0–7.0)
Eosinophils Absolute: 0 10*3/uL (ref 0.0–0.5)
HEMATOCRIT: 34.8 % (ref 34.8–46.6)
HGB: 11.1 g/dL — ABNORMAL LOW (ref 11.6–15.9)
LYMPH#: 1.6 10*3/uL (ref 0.9–3.3)
LYMPH%: 39.4 % (ref 14.0–49.7)
MCH: 27.8 pg (ref 25.1–34.0)
MCHC: 31.8 g/dL (ref 31.5–36.0)
MCV: 87.2 fL (ref 79.5–101.0)
MONO#: 0.2 10*3/uL (ref 0.1–0.9)
MONO%: 5.7 % (ref 0.0–14.0)
NEUT#: 2.2 10*3/uL (ref 1.5–6.5)
NEUT%: 53.4 % (ref 38.4–76.8)
PLATELETS: 216 10*3/uL (ref 145–400)
RBC: 3.99 10*6/uL (ref 3.70–5.45)
RDW: 16.2 % — ABNORMAL HIGH (ref 11.2–14.5)
WBC: 4 10*3/uL (ref 3.9–10.3)

## 2013-08-22 MED ORDER — DEXAMETHASONE SODIUM PHOSPHATE 20 MG/5ML IJ SOLN
20.0000 mg | Freq: Once | INTRAMUSCULAR | Status: AC
  Administered 2013-08-22: 20 mg via INTRAVENOUS

## 2013-08-22 MED ORDER — ONDANSETRON 16 MG/50ML IVPB (CHCC)
INTRAVENOUS | Status: AC
  Filled 2013-08-22: qty 16

## 2013-08-22 MED ORDER — DEXAMETHASONE SODIUM PHOSPHATE 20 MG/5ML IJ SOLN
INTRAMUSCULAR | Status: AC
  Filled 2013-08-22: qty 5

## 2013-08-22 MED ORDER — SODIUM CHLORIDE 0.9 % IJ SOLN
10.0000 mL | INTRAMUSCULAR | Status: DC | PRN
  Administered 2013-08-22: 10 mL
  Filled 2013-08-22: qty 10

## 2013-08-22 MED ORDER — DOCETAXEL CHEMO INJECTION 160 MG/16ML
75.0000 mg/m2 | Freq: Once | INTRAVENOUS | Status: AC
  Administered 2013-08-22: 160 mg via INTRAVENOUS
  Filled 2013-08-22: qty 16

## 2013-08-22 MED ORDER — ONDANSETRON 16 MG/50ML IVPB (CHCC)
16.0000 mg | Freq: Once | INTRAVENOUS | Status: AC
  Administered 2013-08-22: 16 mg via INTRAVENOUS

## 2013-08-22 MED ORDER — SODIUM CHLORIDE 0.9 % IV SOLN
881.4000 mg | Freq: Once | INTRAVENOUS | Status: AC
  Administered 2013-08-22: 880 mg via INTRAVENOUS
  Filled 2013-08-22: qty 88

## 2013-08-22 MED ORDER — SODIUM CHLORIDE 0.9 % IV SOLN
Freq: Once | INTRAVENOUS | Status: AC
  Administered 2013-08-22: 12:00:00 via INTRAVENOUS

## 2013-08-22 MED ORDER — HEPARIN SOD (PORK) LOCK FLUSH 100 UNIT/ML IV SOLN
500.0000 [IU] | Freq: Once | INTRAVENOUS | Status: AC | PRN
  Administered 2013-08-22: 500 [IU]
  Filled 2013-08-22: qty 5

## 2013-08-22 NOTE — Progress Notes (Signed)
[facility]  [phone] [phone]     ID: [name] OB: 10-Apr-1976  MR#: [id number]  IWO#:[id number]  PCP: [name] GYN:   SU: [name] OTHER MD: [name] [phone])  CHIEF COMPLAINT:  Hx of Left Breast Cancer with Left Axillary Recurrence/Receiving Chemotherapy   HISTORY OF PRESENT ILLNESS: [name] had bilateral diagnostic mammography [facility] in [place] 11/30/2009 showing [name] 1.5 cm mass at the 9:00 position of the left breast with some satellites. [name] biopsy of the mass in question [facility] showed ([id number]) and invasive lobular carcinoma, which was estrogen and progesterone receptor both 100% positive, both with strong staining intensity, but HER-2 negative at 1+. Bilateral breast MRI 12/13/2009 in [place] showed in the left breast an irregularly marginated mass measuring 3.2 cm. There were 4 or 5 separate satellite lesions laterally and superior to the primary.  Accordingly, after appropriate discussion, the patient underwent bilateral mastectomies with left sentinel lymph node sampling 01/02/2010. The right breast was benign. The left breast showed [name] 2.3 cm invasive lobular carcinoma, grade 3, with [name] total of 6 sentinel lymph nodes removed, all negative, although 2 showed isolated tumor cells by immunostaining. Margins were negative.  An Oncotype BX was sent, with [name] recurrence score of 22, predicting [name] risk of distant recurrence within 10 years with 14% of the patients only systemic treatment was tamoxifen. The patient was treated with CMF chemotherapy between 02/22/2010 and 07/05/2010, receiving 7 cycles at which point the patient refused further chemotherapy though there have been no major toxicities at according to the oncology note from [name]. The patient was then started on tamoxifen 08/09/2010. By her cancer took approximately 12-15 months then stopped  because of hot flashes and insomnia problems.  More recently, the patient noted [name] change in her left axilla andunderwent bilateral breast MRI January to 2015 at [place]. The patient has bilateral submuscular silicone implants in place. The breast were unremarkable, but there were several lymph nodes with cortical thickening in the left axilla including [name] dominant 1 measuring 1.2 cm in the short axis. Ultrasonography of this area identified the lymph node in question and the patient underwent biopsy of the left axillary lymph node This showed ([id number]) near-total replacement of the lymph node by metastatic carcinoma. This was 96% estrogen receptor positive, and 84% progesterone receptor positive, both with strong staining intensity. The MIB-1 was 20%. HER-2 determination is pending.  The patient's subsequent history is as detailed below  INTERVAL HISTORY: [name] returns alone today for followup of her left breast cancer. She is due for day 1 cycle 3 of 6 planned q. three-week doses of docetaxel/carboplatin.  She received Neulasta on day 2 for granulocyte support.  When [name] was seen here [name] couple of weeks ago, she felt so poorly she didn't think she could continue with chemotherapy. We did make several changes in her medication regimen at home, and today she tells me she feels "much better". In fact all of her previous GI issues have now resolved. She's eating well and has [name] good appetite. She's had no nausea or emesis. She's having regular bowel movements. She denies any abdominal pain.  She recently increased her venlafaxine dose as directed, from 37.5 mg [name] 75 mg daily. She's beginning to notice only slight improvement in her depression, and wonders if she will eventually need to increase to 150 mg daily which is certainly [name] possibility.  REVIEW OF SYSTEMS: [name]';s  mood is good today, and although she continues to have some depression, she does seem much more upbeat. She denies any  suicidal ideation. She has had no illnesses and denies fevers, chills, or night sweats, although she does have occasional hot flashes. Her energy level is fair. She's had no mouth ulcers or oral sensitivity. She denies any rashes or skin changes, and also denies any abnormal bruising.  She has some daily headaches which she attributes to sinus and seasonal allergies. She denies any dizziness or change in vision. She has an occasional cough productive of clear phlegm. She denies any pleurisy, increased shortness of breath, peripheral swelling, chest pain, or palpitations. She continues to have postsurgical pain in the left axilla, radiating around into the left upper back. She denies any additional myalgias, arthralgias, or bony pain, and has had no signs of peripheral neuropathy in either the upper or lower extremities. She would like for me to check [name] "lump" she has felt near the right groin.  [name] detailed review of systems is otherwise stable and noncontributory.   PAST MEDICAL HISTORY: Past Medical History  Diagnosis Date  . Cancer     breast ca    PAST SURGICAL HISTORY: Past Surgical History  Procedure Laterality Date  . Bilateral total mastectomy with axillary lymph node dissection    . Tonsillectomy    . Leep    . Portacath placement N/[name] 06/16/2013    Procedure: POWER PORT PLACEMENT;  Surgeon: Stacy Medal, MD;  Location: [facility];  Service: [name];  Laterality: N/[name];  . Axillary lymph node dissection Left 06/16/2013    Procedure: LEFT AXILLARY NODE DISSECTION;  Surgeon: Stacy Medal, MD;  Location: [facility];  Service: [name];  Laterality: Left;    FAMILY HISTORY Family History  Problem Relation Age of Onset  . Other Mother     glioblastoma  . Other Father     glioblastoma   according to the patient both her parents died from [name] primary brain cancers, namely we have the stomas, her father at 40, her mother at 40. The patient had one brother and one sister. There is no history of  breast or ovarian cancer in the family  GYNECOLOGIC HISTORY:   (Reviewed 08/22/2013) Menarche age 38. The patient has never carried [name] child to term. She is still having regular periods, most recently second week in January. 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 She shares [name] home with her significant other, Burnice Logan, who works at installing cisterns. They have no pets.    ADVANCED DIRECTIVES: Not in place   HEALTH MAINTENANCE:  (Updated  08/22/2013) History  Substance Use Topics  . Smoking status: Never Smoker   . Smokeless tobacco: Never Used  . Alcohol Use: No     Colonoscopy: Never  PAP: December 2014  Bone density: Never  Lipid panel: Not on file   Allergies  Allergen Reactions  . Sulfa Antibiotics Swelling  . Gadolinium Derivatives Hives    After gado injection, pt had [name] few small hives on left breast area. Treated with benadryl by dr Janeece Fitting   . Petrolatum Rash    Current Outpatient Prescriptions  Medication Sig Dispense Refill  . dexamethasone (DECADRON) 4 MG tablet 2 tabs by mouth with food twice daily on day before and 3 days after each chemo  45 tablet  1  . lidocaine-prilocaine (EMLA) cream Apply 1 application topically as needed. 1-2 hours before each port access  30 g  2  . metaxalone (SKELAXIN) 800 MG tablet Take 1 tablet (800 mg total) by mouth 3 (three) times daily as needed for muscle spasms.  60 tablet  0  . naproxen sodium (ANAPROX) 220 MG tablet Take 440 mg by mouth 2 (two) times daily as needed (pain).      Marland Kitchen omeprazole (PRILOSEC) 40 MG capsule Take 1 capsule (40 mg total) by mouth daily.  90 capsule  3  . pantoprazole (PROTONIX) 40 MG tablet Take 1 tablet (40 mg total) by mouth daily.  30 tablet  0  . potassium chloride (MICRO-K) 10 MEQ CR capsule Take 1 capsule (10 mEq total) by mouth 2 (two)  times daily.  60 capsule  2  . 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 venlafaxine XR (EFFEXOR-XR) 75 MG 24 hr capsule Take 1 capsule (75 mg total) by mouth daily with breakfast. Begin 08/08/2013, after taking 37.5 mg dose x 7 days  30 capsule  3  . cholestyramine (QUESTRAN) 4 G packet Take 1 packet (4 g total) by mouth 2 (two) times daily as needed. For diarrhea (Take with food)  30 each  0  . dicyclomine (BENTYL) 10 MG capsule Take 1 capsule (10 mg total) by mouth 4 (four) times daily -  before meals and at bedtime.  60 capsule  1  . HYDROcodone-acetaminophen (NORCO/VICODIN) 5-325 MG per tablet Take 1-2 tablets by mouth every 6 (six) hours as needed.  30 tablet  0  . LORazepam (ATIVAN) 0.5 MG tablet Take 1 tablet (0.5 mg total) by mouth at bedtime as needed for anxiety or sleep (or nausea).  30 tablet  0  . ondansetron (ZOFRAN) 4 MG tablet Take 1 tablet (4 mg total) by mouth every 6 (six) hours as needed for nausea or vomiting.  20 tablet  0  . ondansetron (ZOFRAN) 8 MG tablet 1 tab by mouth twice daily x 3 days after each chemo, then 1 tab by mouth every 12 hrs if needed for nausea  30 tablet  1  . prochlorperazine (COMPAZINE) 10 MG tablet 1 tab by mouth with meals and bedtime x 3 days after chemo, then 1 tab by mouth every 6 hrs if needed for nausea  30 tablet  2  . prochlorperazine (COMPAZINE) 25 MG suppository Place 1 suppository (25 mg total) rectally every 12 (twelve) hours as needed for nausea or vomiting.  12 suppository  0  . traMADol (ULTRAM) 50 MG tablet Take 1 tablet (50 mg total) by mouth every 6 (six) hours as needed for moderate pain.  60 tablet  0   No current facility-administered medications for this visit.   Facility-Administered Medications Ordered in Other Visits  Medication Dose Route Frequency Provider Last Rate Last Dose  . CARBOplatin (PARAPLATIN) 880 mg in sodium chloride 0.9 % 500 mL chemo  infusion  880 mg Intravenous Once Amy G Berry, PA-C      . DOCEtaxel (TAXOTERE) 160 mg in dextrose 5 % 250 mL chemo infusion  75 mg/m2 (Treatment Plan Actual) Intravenous Once Amy G Berry, PA-C      . heparin lock flush 100 unit/mL  500 Units Intracatheter Once PRN Amy G Berry, PA-C      . sodium chloride 0.9 % injection 10 mL  10 mL Intracatheter PRN  Tonnya Garbett Milda Smart, PA-C        OBJECTIVE: [name] American woman who appears comfortable and is in no acute distress. Filed Vitals:   08/22/13 1012  BP: 135/87  Pulse: 101  Temp: 98.9 F (37.2 C)  Resp: 20     Body mass index is 32.73 kg/(m^2).    ECOG FS:1 - Symptomatic but completely ambulatory Filed Weights   08/22/13 1012  Weight: 209 lb (94.802 kg)   Physical Exam: HEENT:  Sclerae anicteric.  Oropharynx clear and moist. No ulcerations, mucositis or candidiasis. Neck is supple, trachea midline.  NODES:  No cervical or supraclavicular lymphadenopathy palpated.  BREAST EXAM:  Deferred. Axillae are benign bilaterally with no palpable lymphadenopathy.  LUNGS:  Clear to auscultation bilaterally with good excursion.  No crackles, wheezes or rhonchi HEART:  Regular rate and rhythm. No murmur appreciated.  ABDOMEN:  Soft, nondistended.  Nontender to palpation. No guarding or rebound tenderness.    Positive bowel sounds.  MSK:  No focal spinal tenderness to palpation.  No tenderness to palpation in the left upper quadrant, including the left shoulder and left scapular region. Some limited range of motion in the left upper extremity due to discomfort.  EXTREMITIES:  No peripheral edema.  No lymphedema in the upper extremities. SKIN: There is [name] small superficial bump, just above the right groin, less than 1 cm in diameter at its widest width. This is very superficial, slightly erythematous, but with no obvious pustule noted. Otherwise, skin is benign with no visible rashes or skin lesions. No nail dyscrasia No excessive ecchymoses or petechiae. No  pallor NEURO:  Nonfocal. Well oriented.   Appropriate affect.    LAB RESULTS:   Lab Results  Component Value Date   WBC 4.0 08/22/2013   NEUTROABS 2.2 08/22/2013   HGB 11.1* 08/22/2013   HCT 34.8 08/22/2013   MCV 87.2 08/22/2013   PLT 216 08/22/2013      Chemistry      Component Value Date/Time   NA 142 08/22/2013 0943   NA 135* 07/16/2013 0535   K 3.6 08/22/2013 0943   K 4.4 07/16/2013 0535   CL 97 07/16/2013 0535   CO2 23 08/22/2013 0943   CO2 25 07/16/2013 0535   BUN 14.8 08/22/2013 0943   BUN 11 07/16/2013 0535   CREATININE 0.9 08/22/2013 0943   CREATININE 0.81 07/16/2013 0535      Component Value Date/Time   CALCIUM 10.3 08/22/2013 0943   CALCIUM 10.1 07/16/2013 0535   ALKPHOS 73 08/22/2013 0943   ALKPHOS 92 07/16/2013 0535   AST 25 08/22/2013 0943   AST 16 07/16/2013 0535   ALT 28 08/22/2013 0943   ALT 14 07/16/2013 0535   BILITOT 0.38 08/22/2013 0943   BILITOT 0.9 07/16/2013 0535       STUDIES: Dg Abd 1 View 08/04/2013   CLINICAL DATA:  38 year old female with breast cancer with lymph node involvement. No bowel movement x3 days, constipation. Initial encounter.  EXAM: ABDOMEN - 1 VIEW  COMPARISON:  PET-CT 05/27/2013.  FINDINGS: Supine KUB. Non obstructed bowel gas pattern. Volume of retained stool in the colon does appear increased from the comparison. Abdominal and pelvic visceral contours are within normal limits. Proximal right femur ORIF hardware re- identified. Stable visualized osseous structures.  IMPRESSION: Non obstructed bowel gas pattern, but with increased volume of retained stool in the colon.   Electronically Signed   By: Lars Pinks M.D.   On: 08/04/2013 11:59  ASSESSMENT: 38 y.o. [place] woman  (1) status post bilateral mastectomies with left axillary lymph node sampling [6 nodes removed] 01/02/2010 for [name] pT2 pN0(i+), stage IIA invasive lobular breast cancer, grade 3, estrogen and progesterone receptor positive, HER-2 not amplified; right breast was benign  (2)  Oncotype DX score of 22 predicted [name] 14% risk of distant recurrence within 10 years if the patient's only systemic treatment is tamoxifen for 5 years  (3) status post CMF(cyclophosphamide, fluorouracil, methotrexate) x7 given between October of 2011 and March of 2012 (7 of 8 planned treatments completed)  (4) tamoxifen started April 2012, discontinued approximately July of 2013 (about 15 months) because of problems with hot flashes and insomnia  (5) pathologically documented left axillary recurrence 05/05/2013, the tumor being again estrogen and progesterone receptor positive, with HER-2 not amplified, and MIB-1 of 15%. Staging CT/PET 05/27/2013 show left axillary and supraclavicular recurrence, no distant disease  (6) status post completion left axillary lymph node dissection 06/16/2013 showing [name] total of 10/15 lymph nodes involved by tumor, with evidence of extracapsular extension (TX N3 = stage IIIC)  (7) being treated with carboplatin/ docetaxel, first dose on 07/11/2013, to be repeated every 21 days x 6 with Neulasta support on day 2  (8) radiation to follow chemotherapy  (9) bilateral salpingo-oophorectomy later this year, followed by aromatase inhibitor therapy for at least 5 years  (10)  depression, being treated with Effexor XR plus counseling  (11) GI symptoms, consistent with IBS - improved   PLAN: Sidonie is certainly feeling much better. She will proceed to treatment today as scheduled for her third cycle of docetaxel/carboplatin. She will receive Neulasta tomorrow, and will return next week on may fourth for assessment of chemotoxicity. She will continue on her current medication regimen at home. Specifically, she will continue holding the Bentyl, we'll take Gas-X as needed throughout the day, and we'll continue to eat and easily digestible, somewhat bland diet. I have asked her to contact our office immediately with any recurring abdominal pain or change in bowel habits.   I did  discuss that she try switching from Claritin to Claritin-D for what appears to be some increased sinus congestion, likely associated with seasonal allergies. She will let us know if the headaches continued, and certainly if they worsen. She should also contact us with any fevers of 100 or above.  I also suggested that she try some warm compresses to the area in her right groin.  I think this may be associated with her recent hair loss in that area, and this does not feel consistent with [name] palpable lymph node.  All of the above was reviewed in detail with Levada Dy today. She voices understanding and agreement with our plan, and will call with any changes or problems.   Theotis Burrow, PA-C   08/22/2013 12:01 PM

## 2013-08-22 NOTE — Patient Instructions (Signed)
[facility] Discharge Instructions for Patients Receiving Chemotherapy  Today you received the following chemotherapy agents Taxotere and Carboplatin.  To help prevent nausea and vomiting after your treatment, we encourage you to take your nausea medication.   If you develop nausea and vomiting that is not controlled by your nausea medication, call the clinic.   BELOW ARE SYMPTOMS THAT SHOULD BE REPORTED IMMEDIATELY:  *FEVER GREATER THAN 100.5 F  *CHILLS WITH OR WITHOUT FEVER  NAUSEA AND VOMITING THAT IS NOT CONTROLLED WITH YOUR NAUSEA MEDICATION  *UNUSUAL SHORTNESS OF BREATH  *UNUSUAL BRUISING OR BLEEDING  TENDERNESS IN MOUTH AND THROAT WITH OR WITHOUT PRESENCE OF ULCERS  *URINARY PROBLEMS  *BOWEL PROBLEMS  UNUSUAL RASH Items with * indicate a potential emergency and should be followed up as soon as possible.  Feel free to call the clinic you have any questions or concerns. The clinic phone number is [phone].    

## 2013-08-23 ENCOUNTER — Ambulatory Visit (HOSPITAL_BASED_OUTPATIENT_CLINIC_OR_DEPARTMENT_OTHER): Payer: 59

## 2013-08-23 ENCOUNTER — Encounter: Payer: Self-pay | Admitting: Oncology

## 2013-08-23 VITALS — BP 130/88 | HR 93 | Temp 98.6°F

## 2013-08-23 DIAGNOSIS — C773 Secondary and unspecified malignant neoplasm of axilla and upper limb lymph nodes: Secondary | ICD-10-CM

## 2013-08-23 DIAGNOSIS — C50919 Malignant neoplasm of unspecified site of unspecified female breast: Secondary | ICD-10-CM

## 2013-08-23 DIAGNOSIS — Z5189 Encounter for other specified aftercare: Secondary | ICD-10-CM

## 2013-08-23 MED ORDER — PEGFILGRASTIM INJECTION 6 MG/0.6ML
6.0000 mg | Freq: Once | SUBCUTANEOUS | Status: AC
  Administered 2013-08-23: 6 mg via SUBCUTANEOUS
  Filled 2013-08-23: qty 0.6

## 2013-08-24 ENCOUNTER — Encounter: Payer: Self-pay | Admitting: *Deleted

## 2013-08-24 ENCOUNTER — Telehealth: Payer: Self-pay | Admitting: Physician Assistant

## 2013-08-24 NOTE — Telephone Encounter (Signed)
, °

## 2013-08-24 NOTE — Progress Notes (Signed)
Patient requested a letter be written to her employer stating how long she would be out of work. Please fax letter to Mr. Pati Gallo and fax number is [phone]. This was discussed with Micah Flesher, PA.  This RN wrote a letter, faxed it and sent it to be scanned. Also, I called and spoke with patient telling her the letter had been faxed. Patient verbalized understanding.

## 2013-08-29 ENCOUNTER — Ambulatory Visit: Payer: 59

## 2013-08-29 ENCOUNTER — Other Ambulatory Visit: Payer: 59

## 2013-08-31 ENCOUNTER — Other Ambulatory Visit (HOSPITAL_BASED_OUTPATIENT_CLINIC_OR_DEPARTMENT_OTHER): Payer: 59

## 2013-08-31 ENCOUNTER — Ambulatory Visit (HOSPITAL_BASED_OUTPATIENT_CLINIC_OR_DEPARTMENT_OTHER): Payer: 59 | Admitting: Hematology and Oncology

## 2013-08-31 VITALS — BP 126/81 | HR 93 | Temp 98.8°F | Resp 20 | Ht 67.0 in | Wt 206.5 lb

## 2013-08-31 DIAGNOSIS — F329 Major depressive disorder, single episode, unspecified: Secondary | ICD-10-CM

## 2013-08-31 DIAGNOSIS — I890 Lymphedema, not elsewhere classified: Secondary | ICD-10-CM

## 2013-08-31 DIAGNOSIS — C50919 Malignant neoplasm of unspecified site of unspecified female breast: Secondary | ICD-10-CM

## 2013-08-31 DIAGNOSIS — C773 Secondary and unspecified malignant neoplasm of axilla and upper limb lymph nodes: Secondary | ICD-10-CM

## 2013-08-31 DIAGNOSIS — F3289 Other specified depressive episodes: Secondary | ICD-10-CM

## 2013-08-31 LAB — COMPREHENSIVE METABOLIC PANEL (CC13)
ALT: 16 U/L (ref 0–55)
ANION GAP: 8 meq/L (ref 3–11)
AST: 20 U/L (ref 5–34)
Albumin: 3.5 g/dL (ref 3.5–5.0)
Alkaline Phosphatase: 109 U/L (ref 40–150)
BUN: 7.8 mg/dL (ref 7.0–26.0)
CO2: 26 meq/L (ref 22–29)
Calcium: 10.8 mg/dL — ABNORMAL HIGH (ref 8.4–10.4)
Chloride: 109 mEq/L (ref 98–109)
Creatinine: 0.8 mg/dL (ref 0.6–1.1)
Glucose: 97 mg/dl (ref 70–140)
Potassium: 3.8 mEq/L (ref 3.5–5.1)
SODIUM: 143 meq/L (ref 136–145)
TOTAL PROTEIN: 6.9 g/dL (ref 6.4–8.3)

## 2013-08-31 LAB — CBC WITH DIFFERENTIAL/PLATELET
BASO%: 0.1 % (ref 0.0–2.0)
Basophils Absolute: 0 10*3/uL (ref 0.0–0.1)
EOS%: 0.2 % (ref 0.0–7.0)
Eosinophils Absolute: 0 10*3/uL (ref 0.0–0.5)
HCT: 33.1 % — ABNORMAL LOW (ref 34.8–46.6)
HGB: 10.4 g/dL — ABNORMAL LOW (ref 11.6–15.9)
LYMPH%: 14.5 % (ref 14.0–49.7)
MCH: 27.5 pg (ref 25.1–34.0)
MCHC: 31.4 g/dL — ABNORMAL LOW (ref 31.5–36.0)
MCV: 87.5 fL (ref 79.5–101.0)
MONO#: 1.9 10*3/uL — AB (ref 0.1–0.9)
MONO%: 7.6 % (ref 0.0–14.0)
NEUT#: 19.1 10*3/uL — ABNORMAL HIGH (ref 1.5–6.5)
NEUT%: 77.6 % — ABNORMAL HIGH (ref 38.4–76.8)
Platelets: 358 10*3/uL (ref 145–400)
RBC: 3.78 10*6/uL (ref 3.70–5.45)
RDW: 16.1 % — ABNORMAL HIGH (ref 11.2–14.5)
WBC: 24.6 10*3/uL — AB (ref 3.9–10.3)
lymph#: 3.6 10*3/uL — ABNORMAL HIGH (ref 0.9–3.3)

## 2013-08-31 NOTE — Progress Notes (Signed)
[place]  [phone] [phone]     ID: [name] OB: 07/26/1975  MR#: [id number]  HTD#:[id number]  PCP: [name] GYN:   SU: Alphonsa Overall OTHER MD: Lonia Chimera [phone])  CHIEF COMPLAINT:  Follow up visit to assess for chemotoxicities after chemotherapy   DIAGNOSIS:  Left Breast Cancer with Left Axillary Recurrence/Receiving Chemotherapy   HISTORY OF PRESENT ILLNESS: As per previously documented note:  [name] had bilateral diagnostic mammography [facility] in [place] 11/30/2009 showing a 1.5 cm mass at the 9:00 position of the left breast with some satellites. A biopsy of the mass in question [facility] showed ([id number]) and invasive lobular carcinoma, which was estrogen and progesterone receptor both 100% positive, both with strong staining intensity, but HER-2 negative at 1+. Bilateral breast MRI 12/13/2009 in [place] showed in the left breast an irregularly marginated mass measuring 3.2 cm. There were 4 or 5 separate satellite lesions laterally and superior to the primary.  Accordingly, after appropriate discussion, the patient underwent bilateral mastectomies with left sentinel lymph node sampling 01/02/2010. The right breast was benign. The left breast showed a 2.3 cm invasive lobular carcinoma, grade 3, with a total of 6 sentinel lymph nodes removed, all negative, although 2 showed isolated tumor cells by immunostaining. Margins were negative.  An Oncotype BX was sent, with a recurrence score of 22, predicting a risk of distant recurrence within 10 years with 14% of the patients only systemic treatment was tamoxifen. The patient was treated with CMF chemotherapy between 02/22/2010 and 07/05/2010, receiving 7 cycles at which point the patient refused further chemotherapy though there have been no major toxicities at according to the oncology note from Dr. Brent General.  The patient was then started on tamoxifen 08/09/2010. By her cancer took approximately 12-15 months then stopped because of hot flashes and insomnia problems.  More recently, the patient noted a change in her left axilla andunderwent bilateral breast MRI January to 2015 at [facility]. The patient has bilateral submuscular silicone implants in place. The breast were unremarkable, but there were several lymph nodes with cortical thickening in the left axilla including a dominant 1 measuring 1.2 cm in the short axis. Ultrasonography of this area identified the lymph node in question and the patient underwent biopsy of the left axillary lymph node This showed ([id number]) near-total replacement of the lymph node by metastatic carcinoma. This was 96% estrogen receptor positive, and 84% progesterone receptor positive, both with strong staining intensity. The MIB-1 was 20%. HER-2 determination is pending.  The patient's subsequent history is as detailed below  INTERVAL HISTORY: [name] returns alone today for followup of her left breast cancer. She completed cycle 3 chemotherapy on 08/22/2013 followed by myeloid growth factor support injection on day #2. She complains of feeling hot after the chemotherapy with some myalgias. She also complains of nausea that is controlled with medications. She is due for cycle4 of 6 planned q. three-week doses of docetaxel/carboplatin on Sep 12, 2013.  She also complains of left upper extremity tightness. She also complains of taste changes. Her appetite is slightly better now.  Denies any shortness of breath, chest pain, palpitations, blood in the stool or blood in the urine. Denies any headaches or blurred vision  REVIEW OF SYSTEMS:  A 10 POINT REVIEW OF SYSTEMS IS BEEN ASSESSED AND PERTINENT SYMPTOMS WERE MENTIONED IN INTERVAL HISTORY  PAST MEDICAL HISTORY: Past Medical History  Diagnosis Date  . Cancer  breast ca    PAST SURGICAL HISTORY: Past Surgical  History  Procedure Laterality Date  . Bilateral total mastectomy with axillary lymph node dissection    . Tonsillectomy    . Leep    . Portacath placement N/A 06/16/2013    Procedure: POWER PORT PLACEMENT;  Surgeon: Shann Medal, MD;  Location: [facility];  Service: General;  Laterality: N/A;  . Axillary lymph node dissection Left 06/16/2013    Procedure: LEFT AXILLARY NODE DISSECTION;  Surgeon: Shann Medal, MD;  Location: [facility];  Service: General;  Laterality: Left;    FAMILY HISTORY Family History  Problem Relation Age of Onset  . Other Mother     glioblastoma  . Other Father     glioblastoma   according to the patient both her parents died from a primary brain cancers, namely we have the stomas, her father at 3, her mother at 10. The patient had one brother and one sister. There is no history of breast or ovarian cancer in the family  GYNECOLOGIC HISTORY:   (Reviewed 08/22/2013) Menarche age 43. The patient has never carried a child to term. She is still having regular periods, most recently second week in January. The patient took oral contraceptives between the ages of 43 and 41 with no complications  SOCIAL HISTORY: (Updated 08/22/2013)   Cailan works as a Corporate treasurer for [employer] She shares a home with her significant other, [name], who works at installing cisterns. They have no pets.    ADVANCED DIRECTIVES: Not in place   HEALTH MAINTENANCE:  (Updated  08/22/2013) History  Substance Use Topics  . Smoking status: Never Smoker   . Smokeless tobacco: Never Used  . Alcohol Use: No     Colonoscopy: Never  PAP: December 2014  Bone density: Never  Lipid panel: Not on file   Allergies  Allergen Reactions  . Sulfa Antibiotics Swelling  . Gadolinium Derivatives Hives    After gado injection, pt had a few small hives on left breast area. Treated with benadryl by dr Janeece Fitting   . Petrolatum Rash    Current Outpatient Prescriptions   Medication Sig Dispense Refill  . dexamethasone (DECADRON) 4 MG tablet 2 tabs by mouth with food twice daily on day before and 3 days after each chemo  45 tablet  1  . dicyclomine (BENTYL) 10 MG capsule Take 1 capsule (10 mg total) by mouth 4 (four) times daily -  before meals and at bedtime.  60 capsule  1  . HYDROcodone-acetaminophen (NORCO/VICODIN) 5-325 MG per tablet Take 1-2 tablets by mouth every 6 (six) hours as needed.  30 tablet  0  . lidocaine-prilocaine (EMLA) cream Apply 1 application topically as needed. 1-2 hours before each port access  30 g  2  . LORazepam (ATIVAN) 0.5 MG tablet Take 1 tablet (0.5 mg total) by mouth at bedtime as needed for anxiety or sleep (or nausea).  30 tablet  0  . metaxalone (SKELAXIN) 800 MG tablet Take 1 tablet (800 mg total) by mouth 3 (three) times daily as needed for muscle spasms.  60 tablet  0  . omeprazole (PRILOSEC) 40 MG capsule Take 1 capsule (40 mg total) by mouth daily.  90 capsule  3  . ondansetron (ZOFRAN) 8 MG tablet 1 tab by mouth twice daily x 3 days after each chemo, then 1 tab by mouth every 12 hrs if needed for nausea  30 tablet  1  . potassium chloride (MICRO-K)  10 MEQ CR capsule Take 1 capsule (10 mEq total) by mouth 2 (two) times daily.  60 capsule  2  . prochlorperazine (COMPAZINE) 10 MG tablet 1 tab by mouth with meals and bedtime x 3 days after chemo, then 1 tab by mouth every 6 hrs if needed for nausea  30 tablet  2  . prochlorperazine (COMPAZINE) 25 MG suppository Place 1 suppository (25 mg total) rectally every 12 (twelve) hours as needed for nausea or vomiting.  12 suppository  0  . 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 venlafaxine XR (EFFEXOR-XR) 75 MG 24 hr capsule Take 1 capsule (75 mg total) by mouth daily with breakfast. Begin 08/08/2013, after taking 37.5 mg dose x 7 days  30 capsule  3  . cholestyramine (QUESTRAN) 4 G packet Take 1 packet (4 g  total) by mouth 2 (two) times daily as needed. For diarrhea (Take with food)  30 each  0  . traMADol (ULTRAM) 50 MG tablet Take 1 tablet (50 mg total) by mouth every 6 (six) hours as needed for moderate pain.  60 tablet  0   No current facility-administered medications for this visit.    OBJECTIVE: Young [place] American woman who appears comfortable and is in no acute distress. Filed Vitals:   08/31/13 1309  BP: 126/81  Pulse: 93  Temp: 98.8 F (37.1 C)  Resp: 20     Body mass index is 32.33 kg/(m^2).    ECOG FS:1 - Symptomatic but completely ambulatory Filed Weights   08/31/13 1309  Weight: 206 lb 8 oz (93.668 kg)   Physical Exam: HEENT:  Sclerae anicteric.  Oropharynx clear and moist. No ulcerations, mucositis or candidiasis. Neck is supple, trachea midline.  NODES:  No cervical or supraclavicular lymphadenopathy palpated.  BREAST EXAM:  Deferred. Axillae are benign bilaterally with no palpable lymphadenopathy.  LUNGS:  Clear to auscultation bilaterally with good excursion.  No crackles, wheezes or rhonchi HEART:  Regular rate and rhythm. No murmur appreciated.  ABDOMEN:  Soft, nondistended.  Nontender to palpation. No guarding or rebound tenderness.    Positive bowel sounds.  MSK:  No focal spinal tenderness to palpation.  No tenderness to palpation in the left upper quadrant, including the left shoulder and left scapular region. Some limited range of motion in the left upper extremity due to discomfort.  EXTREMITIES:  No peripheral edema.   SKIN:  No nail dyscrasia No excessive ecchymoses or petechiae. No pallor NEURO:  Nonfocal. Well oriented.   Appropriate affect.    LAB RESULTS:   Lab Results  Component Value Date   WBC 24.6* 08/31/2013   NEUTROABS 19.1* 08/31/2013   HGB 10.4* 08/31/2013   HCT 33.1* 08/31/2013   MCV 87.5 08/31/2013   PLT 358 08/31/2013      Chemistry      Component Value Date/Time   NA 143 08/31/2013 1250   NA 135* 07/16/2013 0535   K 3.8 08/31/2013 1250    K 4.4 07/16/2013 0535   CL 97 07/16/2013 0535   CO2 26 08/31/2013 1250   CO2 25 07/16/2013 0535   BUN 7.8 08/31/2013 1250   BUN 11 07/16/2013 0535   CREATININE 0.8 08/31/2013 1250   CREATININE 0.81 07/16/2013 0535      Component Value Date/Time   CALCIUM 10.8* 08/31/2013 1250   CALCIUM 10.1 07/16/2013 0535   ALKPHOS 109 08/31/2013 1250   ALKPHOS 92 07/16/2013  0535   AST 20 08/31/2013 1250   AST 16 07/16/2013 0535   ALT 16 08/31/2013 1250   ALT 14 07/16/2013 0535   BILITOT <0.20 08/31/2013 1250   BILITOT 0.9 07/16/2013 0535       STUDIES: Dg Abd 1 View 08/04/2013   CLINICAL DATA:  38 year old female with breast cancer with lymph node involvement. No bowel movement x3 days, constipation. Initial encounter.  EXAM: ABDOMEN - 1 VIEW  COMPARISON:  PET-CT 05/27/2013.  FINDINGS: Supine KUB. Non obstructed bowel gas pattern. Volume of retained stool in the colon does appear increased from the comparison. Abdominal and pelvic visceral contours are within normal limits. Proximal right femur ORIF hardware re- identified. Stable visualized osseous structures.  IMPRESSION: Non obstructed bowel gas pattern, but with increased volume of retained stool in the colon.   Electronically Signed   By: Lars Pinks M.D.   On: 08/04/2013 11:59     ASSESSMENT: 38 y.o. [place] woman  (1) status post bilateral mastectomies with left axillary lymph node sampling [6 nodes removed] 01/02/2010 for a pT2 pN0(i+), stage IIA invasive lobular breast cancer, grade 3, estrogen and progesterone receptor positive, HER-2 not amplified; right breast was benign  (2) Oncotype DX score of 22 predicted a 14% risk of distant recurrence within 10 years if the patient's only systemic treatment is tamoxifen for 5 years  (3) status post CMF(cyclophosphamide, fluorouracil, methotrexate) x7 given between October of 2011 and March of 2012 (7 of 8 planned treatments completed)  (4) tamoxifen started April 2012, discontinued approximately July of 2013 (about 15  months) because of problems with hot flashes and insomnia  (5) pathologically documented left axillary recurrence 05/05/2013, the tumor being again estrogen and progesterone receptor positive, with HER-2 not amplified, and MIB-1 of 15%. Staging CT/PET 05/27/2013 show left axillary and supraclavicular recurrence, no distant disease  (6) status post completion left axillary lymph node dissection 06/16/2013 showing a total of 10/15 lymph nodes involved by tumor, with evidence of extracapsular extension (TX N3 = stage IIIC)  (7) being treated with carboplatin/ docetaxel, first dose on 07/11/2013, to be repeated every 21 days x 6 with Neulasta support on day 2  (8) radiation to follow chemotherapy  (9) bilateral salpingo-oophorectomy later this year, followed by aromatase inhibitor therapy for at least 5 years  (10)  depression, being treated with Effexor XR plus counseling  (11) GI symptoms, consistent with IBS - improved  (12) left upper extremity tightness/lymphedema: Will refer her to lymphedema clinic  PLAN: Nan  completed her third cycle of chemotherapy carboplatin and docetaxel on April 27,2015. She also received the Neulasta treatment on day #2 of her chemotherapy. Since she developed myalgias and hot flashes I have asked the patient to take Claritin for 5 days with Neulasta therapy. Continue supportive care with as needed nausea vomiting medications and pain medications. I have reviewed her CBC and CMP . Her white cell count improved after Neulasta therapy I have encouraged her to drink water at least one of 1.5 to 2 L per day Next followup visit on 09/09/2013 with Amy Gwenlyn Found as well as lab work on the same day followed by fourth cycle of chemotherapy scheduled on 09/12/2013 All of the above was reviewed in detail with Levada Dy today. She voices understanding and agreement with our plan, and will call with any changes or problems.   Wilmon Arms, MD  [facility]   08/31/2013  7:54 PM

## 2013-09-06 ENCOUNTER — Other Ambulatory Visit: Payer: Self-pay | Admitting: *Deleted

## 2013-09-06 ENCOUNTER — Telehealth: Payer: Self-pay | Admitting: *Deleted

## 2013-09-06 MED ORDER — FUROSEMIDE 20 MG PO TABS: 20.0000 mg | ORAL_TABLET | Freq: Every day | ORAL | Status: DC

## 2013-09-06 NOTE — Telephone Encounter (Signed)
Pt called to state onset of swelling in bilateral legs and now in her arms as well.  " just swollen all over and it is kinda painful ".  Per further inquiry - pain is related to " feeling of all over tightness with movement"  Pt is currently under chemo with use of taxetere.  Above will be reviewed with MD for appropriate recommendations.   Last chemo was 4/27 with next scheduled follow up 5/15 and next chemo 5/18.

## 2013-09-08 ENCOUNTER — Other Ambulatory Visit: Payer: Self-pay | Admitting: *Deleted

## 2013-09-08 ENCOUNTER — Encounter: Payer: Self-pay | Admitting: Physician Assistant

## 2013-09-08 ENCOUNTER — Ambulatory Visit: Payer: 59

## 2013-09-08 ENCOUNTER — Telehealth: Payer: Self-pay | Admitting: Physician Assistant

## 2013-09-08 ENCOUNTER — Other Ambulatory Visit (HOSPITAL_BASED_OUTPATIENT_CLINIC_OR_DEPARTMENT_OTHER): Payer: 59

## 2013-09-08 ENCOUNTER — Ambulatory Visit (HOSPITAL_BASED_OUTPATIENT_CLINIC_OR_DEPARTMENT_OTHER): Payer: 59 | Admitting: Physician Assistant

## 2013-09-08 VITALS — BP 145/80 | HR 99 | Temp 99.0°F | Resp 18 | Ht 67.0 in | Wt 212.6 lb

## 2013-09-08 DIAGNOSIS — F419 Anxiety disorder, unspecified: Secondary | ICD-10-CM | POA: Insufficient documentation

## 2013-09-08 DIAGNOSIS — C50919 Malignant neoplasm of unspecified site of unspecified female breast: Secondary | ICD-10-CM

## 2013-09-08 DIAGNOSIS — R609 Edema, unspecified: Secondary | ICD-10-CM

## 2013-09-08 DIAGNOSIS — Z170 Estrogen receptor positive status [ER+]: Secondary | ICD-10-CM

## 2013-09-08 DIAGNOSIS — N912 Amenorrhea, unspecified: Secondary | ICD-10-CM

## 2013-09-08 DIAGNOSIS — F3289 Other specified depressive episodes: Secondary | ICD-10-CM

## 2013-09-08 DIAGNOSIS — F329 Major depressive disorder, single episode, unspecified: Secondary | ICD-10-CM

## 2013-09-08 DIAGNOSIS — D649 Anemia, unspecified: Secondary | ICD-10-CM

## 2013-09-08 DIAGNOSIS — M79622 Pain in left upper arm: Secondary | ICD-10-CM

## 2013-09-08 DIAGNOSIS — F32A Depression, unspecified: Secondary | ICD-10-CM

## 2013-09-08 DIAGNOSIS — R600 Localized edema: Secondary | ICD-10-CM | POA: Insufficient documentation

## 2013-09-08 DIAGNOSIS — C773 Secondary and unspecified malignant neoplasm of axilla and upper limb lymph nodes: Secondary | ICD-10-CM

## 2013-09-08 LAB — COMPREHENSIVE METABOLIC PANEL (CC13)
ALT: 29 U/L (ref 0–55)
AST: 28 U/L (ref 5–34)
Albumin: 3.3 g/dL — ABNORMAL LOW (ref 3.5–5.0)
Alkaline Phosphatase: 75 U/L (ref 40–150)
Anion Gap: 11 mEq/L (ref 3–11)
BUN: 9 mg/dL (ref 7.0–26.0)
CHLORIDE: 109 meq/L (ref 98–109)
CO2: 23 mEq/L (ref 22–29)
CREATININE: 0.9 mg/dL (ref 0.6–1.1)
Calcium: 10.2 mg/dL (ref 8.4–10.4)
Glucose: 108 mg/dl (ref 70–140)
Potassium: 3.9 mEq/L (ref 3.5–5.1)
SODIUM: 143 meq/L (ref 136–145)
Total Bilirubin: <0.2 mg/dL (ref 0.20–1.20)
Total Protein: 6.7 g/dL (ref 6.4–8.3)

## 2013-09-08 LAB — CBC WITH DIFFERENTIAL/PLATELET
BASO%: 0.8 % (ref 0.0–2.0)
Basophils Absolute: 0 10*3/uL (ref 0.0–0.1)
EOS%: 0 % (ref 0.0–7.0)
Eosinophils Absolute: 0 10*3/uL (ref 0.0–0.5)
HCT: 33.2 % — ABNORMAL LOW (ref 34.8–46.6)
HGB: 10.8 g/dL — ABNORMAL LOW (ref 11.6–15.9)
LYMPH#: 2.3 10*3/uL (ref 0.9–3.3)
LYMPH%: 46.4 % (ref 14.0–49.7)
MCH: 28.5 pg (ref 25.1–34.0)
MCHC: 32.5 g/dL (ref 31.5–36.0)
MCV: 87.7 fL (ref 79.5–101.0)
MONO#: 0.6 10*3/uL (ref 0.1–0.9)
MONO%: 11.8 % (ref 0.0–14.0)
NEUT#: 2 10*3/uL (ref 1.5–6.5)
NEUT%: 41 % (ref 38.4–76.8)
Platelets: 215 10*3/uL (ref 145–400)
RBC: 3.78 10*6/uL (ref 3.70–5.45)
RDW: 17.9 % — ABNORMAL HIGH (ref 11.2–14.5)
WBC: 4.9 10*3/uL (ref 3.9–10.3)

## 2013-09-08 LAB — HCG, SERUM, QUALITATIVE: Preg, Serum: NEGATIVE

## 2013-09-08 MED ORDER — VENLAFAXINE HCL ER 150 MG PO CP24: 150.0000 mg | ORAL_CAPSULE | Freq: Every day | ORAL | Status: DC

## 2013-09-08 MED ORDER — FUROSEMIDE 20 MG PO TABS: 20.0000 mg | ORAL_TABLET | ORAL | Status: DC

## 2013-09-08 NOTE — Telephone Encounter (Signed)
per pof to sch pt appt-sent email to MW to sch chemo-pt states will look in [place] for the chemo time-printed updated sch for p

## 2013-09-08 NOTE — Progress Notes (Signed)
[place]  [phone] Fax:(336) [phone]     ID: [name] OB: 1976/02/27  MR#: [id number]  EXH#:[id number]  PCP: Colvin Caroli GYN:   SU: Alphonsa Overall OTHER MD: Lonia Chimera [id number]  CHIEF COMPLAINT:  Hx of Left Breast Cancer with Left Axillary Recurrence/Receiving Chemotherapy  (Taxotere/Carbo)   HISTORY OF PRESENT ILLNESS: Jaci had bilateral diagnostic mammography [facility] in [place] 11/30/2009 showing a 1.5 cm mass at the 9:00 position of the left breast with some satellites. A biopsy of the mass in question [facility] showed ([id number]) and invasive lobular carcinoma, which was estrogen and progesterone receptor both 100% positive, both with strong staining intensity, but HER-2 negative at 1+. Bilateral breast MRI 12/13/2009 in [place] showed in the left breast an irregularly marginated mass measuring 3.2 cm. There were 4 or 5 separate satellite lesions laterally and superior to the primary.  Accordingly, after appropriate discussion, the patient underwent bilateral mastectomies with left sentinel lymph node sampling 01/02/2010. The right breast was benign. The left breast showed a 2.3 cm invasive lobular carcinoma, grade 3, with a total of 6 sentinel lymph nodes removed, all negative, although 2 showed isolated tumor cells by immunostaining. Margins were negative.  An Oncotype BX was sent, with a recurrence score of 22, predicting a risk of distant recurrence within 10 years with 14% of the patients only systemic treatment was tamoxifen. The patient was treated with CMF chemotherapy between 02/22/2010 and 07/05/2010, receiving 7 cycles at which point the patient refused further chemotherapy though there have been no major toxicities at according to the oncology note from Dr. Brent General. The patient was then started on tamoxifen 08/09/2010. By her cancer took approximately 12-15  months then stopped because of hot flashes and insomnia problems.  More recently, the patient noted a change in her left axilla andunderwent bilateral breast MRI January to 2015 at [place]. The patient has bilateral submuscular silicone implants in place. The breast were unremarkable, but there were several lymph nodes with cortical thickening in the left axilla including a dominant 1 measuring 1.2 cm in the short axis. Ultrasonography of this area identified the lymph node in question and the patient underwent biopsy of the left axillary lymph node This showed ([id number]) near-total replacement of the lymph node by metastatic carcinoma. This was 96% estrogen receptor positive, and 84% progesterone receptor positive, both with strong staining intensity. The MIB-1 was 20%. HER-2 determination is pending.  The patient's subsequent history is as detailed below  INTERVAL HISTORY: Faron returns alone today for followup of her left breast cancer with left axillary recurrence. She continues to receive docetaxel/carboplatin every 3 weeks, and is due for day 1 cycle 4 of 6 planned cycles this coming Monday, May 18. She receives Neulasta on day 2 for granulocyte support.  Laterica is feeling poorly and has multiple complaints and concerns today. Her biggest complaint is swelling in her extremities, especially her legs.  This has worsened significantly over the past week. She continues to have swelling in the left upper extremity, but this is consistent since the beginning of her therapy, and is likely associated with lymphedema. She was started on Lasix, 20 mg daily, 3 days ago but has seen very little improvement. She tells me it hurts when she walks, and she "is miserable". She is trying to keep herself well hydrated, drinking at least 4 bottles of water per day, and is trying to watch her sodium intake. She  sees very little improvement in the swelling when she elevates her legs. She is very concerned  because she has gained approximately 6 pounds over the past week. While this is likely secondary to the swelling, she admits that she has been having unprotected sex, and is worried that she could possibly be pregnant. Her last menstrual cycle was on 07/18/2013.  In addition, [facility] continues to have quite a bit of depression and anxiety. She has been speaking with our Education officer, museum on a regular basis for counseling. She's currently on venlafaxine at 75 mg daily. She's been on this dose for one month now and can "tell a little difference". She does believe that she would do better on a slightly higher dose, and we had previously discussed the possibility of increasing eventually to 150 mg daily. Currently, she denies any suicidal ideation.   REVIEW OF SYSTEMS: Lizvette has had no fevers or chills but does have hot flashes and night sweats. She has difficulty sleeping. She has moderate fatigue. Her appetite is fairly good. She tends to have some nausea following treatment, but that resolves within a few days. Currently, she denies any nausea, recent change in bowel habits, or change in urinary habits. She's had no cough, shortness of breath, orthopnea, chest pain, or palpitations. She has occasional headaches which have not worsened. She has no associated dizziness or change in vision. The pain in her arm, left axilla, and left upper back is still significant, limiting some of her day-to-day activities. She's also having a lot of pain in her legs as noted above. She's had only a small amount of numbness and tingling in the fingers since cycle 3. This occurs when she first wakes up in the morning, lasts only for 5 minutes, then completely resolves. She's had no additional signs of peripheral neuropathy.  A detailed review of systems is otherwise stable and noncontributory.    PAST MEDICAL HISTORY: Past Medical History  Diagnosis Date  . Cancer     breast ca    PAST SURGICAL HISTORY: Past Surgical  History  Procedure Laterality Date  . Bilateral total mastectomy with axillary lymph node dissection    . Tonsillectomy    . Leep    . Portacath placement N/A 06/16/2013    Procedure: POWER PORT PLACEMENT;  Surgeon: Shann Medal, MD;  Location: [facility];  Service: General;  Laterality: N/A;  . Axillary lymph node dissection Left 06/16/2013    Procedure: LEFT AXILLARY NODE DISSECTION;  Surgeon: Shann Medal, MD;  Location: [facility];  Service: General;  Laterality: Left;    FAMILY HISTORY Family History  Problem Relation Age of Onset  . Other Mother     glioblastoma  . Other Father     glioblastoma   according to the patient both her parents died from a primary brain cancers, namely we have the stomas, her father at 63, her mother at 35. The patient had one brother and one sister. There is no history of breast or ovarian cancer in the family  GYNECOLOGIC HISTORY:   (Reviewed 09/08/2013) Menarche age 16. The patient has never carried a child to term. She was still having regular periods at the time of her recent diagnosis. LMP 07/18/2013 as of 09/08/2013.  The patient took oral contraceptives between the ages of 20 and 63 with no complications  SOCIAL HISTORY: (Updated 09/08/2013)   Danaria works as a Corporate treasurer for [employer] She shares a home with her significant other, Burnice Logan, who  works at installing cisterns. They have no pets.    ADVANCED DIRECTIVES: Not in place   HEALTH MAINTENANCE:  (Reviewed 09/08/2013) History  Substance Use Topics  . Smoking status: Never Smoker   . Smokeless tobacco: Never Used  . Alcohol Use: No     Colonoscopy: Never  PAP: December 2014  Bone density: Never  Lipid panel: Not on file   Allergies  Allergen Reactions  . Sulfa Antibiotics Swelling  . Gadolinium Derivatives Hives    After gado injection, pt had a few small hives on left breast area. Treated with benadryl by dr Janeece Fitting   . Petrolatum Rash    Current  Outpatient Prescriptions  Medication Sig Dispense Refill  . furosemide (LASIX) 20 MG tablet Take 1 tablet (20 mg total) by mouth every other day. As needed for swelling  10 tablet  0  . metaxalone (SKELAXIN) 800 MG tablet Take 1 tablet (800 mg total) by mouth 3 (three) times daily as needed for muscle spasms.  60 tablet  0  . omeprazole (PRILOSEC) 40 MG capsule Take 1 capsule (40 mg total) by mouth daily.  90 capsule  3  . potassium chloride (MICRO-K) 10 MEQ CR capsule Take 1 capsule (10 mEq total) by mouth 2 (two) times daily.  60 capsule  2  . 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 venlafaxine XR (EFFEXOR-XR) 150 MG 24 hr capsule Take 1 capsule (150 mg total) by mouth daily.  30 capsule  3  . cholestyramine (QUESTRAN) 4 G packet Take 1 packet (4 g total) by mouth 2 (two) times daily as needed. For diarrhea (Take with food)  30 each  0  . dexamethasone (DECADRON) 4 MG tablet 2 tabs by mouth with food twice daily on day before and 3 days after each chemo  45 tablet  1  . dicyclomine (BENTYL) 10 MG capsule Take 1 capsule (10 mg total) by mouth 4 (four) times daily -  before meals and at bedtime.  60 capsule  1  . HYDROcodone-acetaminophen (NORCO/VICODIN) 5-325 MG per tablet Take 1-2 tablets by mouth every 6 (six) hours as needed.  30 tablet  0  . lidocaine-prilocaine (EMLA) cream Apply 1 application topically as needed. 1-2 hours before each port access  30 g  2  . LORazepam (ATIVAN) 0.5 MG tablet Take 1 tablet (0.5 mg total) by mouth at bedtime as needed for anxiety or sleep (or nausea).  30 tablet  0  . ondansetron (ZOFRAN) 8 MG tablet 1 tab by mouth twice daily x 3 days after each chemo, then 1 tab by mouth every 12 hrs if needed for nausea  30 tablet  1  . prochlorperazine (COMPAZINE) 10 MG tablet 1 tab by mouth with meals and bedtime x 3 days after chemo, then 1 tab by mouth every 6 hrs if needed for nausea  30 tablet   2  . prochlorperazine (COMPAZINE) 25 MG suppository Place 1 suppository (25 mg total) rectally every 12 (twelve) hours as needed for nausea or vomiting.  12 suppository  0  . traMADol (ULTRAM) 50 MG tablet Take 1 tablet (50 mg total) by mouth every 6 (six) hours as needed for moderate pain.  60 tablet  0   No current facility-administered medications for this visit.    OBJECTIVE: [place] woman who appears uncomfortable and tired. Filed Vitals:   09/08/13 1445  BP: 145/80  Pulse: 99  Temp: 99 F (37.2 C)  Resp: 18     Body mass index is 33.29 kg/(m^2).    ECOG FS:1 - Symptomatic but completely ambulatory Filed Weights   09/08/13 1445  Weight: 212 lb 9.6 oz (96.435 kg)   Physical Exam: HEENT:  Sclerae anicteric.  Oropharynx clear and moist. No evidence of mucositis or candidiasis. MSK:  Limited range of motion in the left upper extremity due to discomfort.   EXTREMITIES:  1+ pitting edema bilaterally in the lower extremities. Equal bilaterally. Nonpitting lymphedema in the left upper extremity. No significant swelling noted in the right upper extremity. SKIN: No visible rashes or skin lesions other than mild hyperpigmentation on the hands bilaterally.. No significant nail dyscrasia.   No excessive ecchymoses or petechiae. No pallor NEURO:  Nonfocal. Well oriented.  Anxious affect.    LAB RESULTS:   Lab Results  Component Value Date   WBC 4.9 09/08/2013   NEUTROABS 2.0 09/08/2013   HGB 10.8* 09/08/2013   HCT 33.2* 09/08/2013   MCV 87.7 09/08/2013   PLT 215 09/08/2013      Chemistry      Component Value Date/Time   NA 143 09/08/2013 1418   NA 135* 07/16/2013 0535   K 3.9 09/08/2013 1418   K 4.4 07/16/2013 0535   CL 97 07/16/2013 0535   CO2 23 09/08/2013 1418   CO2 25 07/16/2013 0535   BUN 9.0 09/08/2013 1418   BUN 11 07/16/2013 0535   CREATININE 0.9 09/08/2013 1418   CREATININE 0.81 07/16/2013 0535      Component Value Date/Time   CALCIUM 10.2 09/08/2013 1418    CALCIUM 10.1 07/16/2013 0535   ALKPHOS 75 09/08/2013 1418   ALKPHOS 92 07/16/2013 0535   AST 28 09/08/2013 1418   AST 16 07/16/2013 0535   ALT 29 09/08/2013 1418   ALT 14 07/16/2013 0535   BILITOT <0.20 09/08/2013 1418   BILITOT 0.9 07/16/2013 0535       STUDIES: Dg Abd 1 View 08/04/2013   CLINICAL DATA:  38 year old female with breast cancer with lymph node involvement. No bowel movement x3 days, constipation. Initial encounter.  EXAM: ABDOMEN - 1 VIEW  COMPARISON:  PET-CT 05/27/2013.  FINDINGS: Supine KUB. Non obstructed bowel gas pattern. Volume of retained stool in the colon does appear increased from the comparison. Abdominal and pelvic visceral contours are within normal limits. Proximal right femur ORIF hardware re- identified. Stable visualized osseous structures.  IMPRESSION: Non obstructed bowel gas pattern, but with increased volume of retained stool in the colon.   Electronically Signed   By: Lars Pinks M.D.   On: 08/04/2013 11:59     ASSESSMENT: 38 y.o. [place] woman  (1) status post bilateral mastectomies with left axillary lymph node sampling [6 nodes removed] 01/02/2010 for a pT2 pN0(i+), stage IIA invasive lobular breast cancer, grade 3, estrogen and progesterone receptor positive, HER-2 not amplified; right breast was benign  (2) Oncotype DX score of 22 predicted a 14% risk of distant recurrence within 10 years if the patient's only systemic treatment is tamoxifen for 5 years  (3) status post CMF(cyclophosphamide, fluorouracil, methotrexate) x7 given between October of 2011 and March of 2012 (7 of 8 planned treatments completed)  (4) tamoxifen started April 2012, discontinued approximately July of 2013 (about 15 months) because of problems with hot flashes and insomnia  (5) pathologically documented left axillary recurrence 05/05/2013, the tumor being again estrogen and progesterone receptor positive, with HER-2 not amplified, and  MIB-1 of 15%. Staging CT/PET 05/27/2013 show  left axillary and supraclavicular recurrence, no distant disease  (6) status post completion left axillary lymph node dissection 06/16/2013 showing a total of 10/15 lymph nodes involved by tumor, with evidence of extracapsular extension (TX N3 = stage IIIC)  (7) being treated with carboplatin/ docetaxel, first dose on 07/11/2013, to be repeated every 21 days x 6 with Neulasta support on day 2  (8) radiation to follow chemotherapy  (9) bilateral salpingo-oophorectomy planned for later this year, followed by aromatase inhibitor therapy for at least 5 years  (10)  depression, being treated with Effexor XR plus counseling  (11) GI symptoms, consistent with IBS - improved  (12)  pitting edema bilaterally in the lower extremities - secondary to Taxotere/dexamethasone versus possible DVT  (13)  amenorrhea, LMP on 07/18/2013   PLAN: The majority of our 45 minute appointment today was spent discussing Cyera's concerns, counseling her with regards to her side effects, discussing treatment options, and coordinating care. This case was also reviewed with Dr. Jana Hakim.  I discussed with Laramie today the possibility of discontinuing docetaxel and switching to a regimen of carboplatin/gemcitabine for the last 3 remaining cycles.  She would receive both agents on day one, gemcitabine alone on day 8. This would likely be much easier to tolerate, but Traci is very focused on her "finish date" and realizes that this would cause her to have "one extra infusion in July". That is actually very important to her.  After much discussion, she would like to "try one more cycle" of the docetaxel and carboplatin as scheduled next week on May 18. I scheduled to see her the following week for assessment of chemotoxicity and if she still has very poor tolerance, we will again discuss the possibility of switching to another regimen for the last 2 cycles.  I think the swelling is likely secondary to the combination of  dexamethasone and docetaxel. She is, however, having a sudden increase in swelling in addition to the increased pain in her legs. I feel it would be prudent to assess further with bilateral Dopplers to evaluate for possible DVT, and that has been scheduled for early tomorrow morning.  Assuming the Dopplers are negative, she will continue on the Lasix at 20 mg every other day for the swelling. I encouraged her to take her potassium every day as instructed to prevent hypokalemia. Also encouraged her to continue drinking plenty of water, limit her salt intake, and elevate her legs whenever possible.  I also think that most likely her 6 pound weight gain is secondary to the swelling and the water retention. Lelaina is very concerned, however, knowing that she has had unprotected sex and would like to have a pregnancy test today which I think is certainly reasonable.  Finally, as noted above, we are going to increase her venlafaxine to 150 mg daily. I encouraged her to continue speaking with our social worker which she finds very helpful.  As a recap, Burnis will return next Monday, May 18, for day 1 cycle 4 of docetaxel/carboplatin as planned. She will receive Neulasta on day 2, May 19. I will see her the following week on may 28 for assessment of chemotoxicity. We will make any changes necessary in her regimen at that time.  The above plan has been reviewed in detail with Levada Dy today, and she was also given this information in writing. She voiced her understanding and agreement with this plan and will call with any changes or  problems.   Theotis Burrow, PA-C   09/08/2013 4:21 PM

## 2013-09-08 NOTE — Progress Notes (Signed)
Please find in [name]'s chart a letter dictated today explaining that she is still under our care and cannot return to work. This latter has been faxed per patient request to Hanan Mcwilliams, at fax [phone]. This is in support of claim [id number].  Micah Flesher, PA-C 09/08/2013

## 2013-09-09 ENCOUNTER — Ambulatory Visit (HOSPITAL_COMMUNITY)
Admission: RE | Admit: 2013-09-09 | Discharge: 2013-09-09 | Disposition: A | Discharge status: Home / Self Care | Payer: 59 | Source: Ambulatory Visit | Attending: Obstetrics and Gynecology | Admitting: Obstetrics and Gynecology

## 2013-09-09 ENCOUNTER — Telehealth: Payer: Self-pay | Admitting: *Deleted

## 2013-09-09 ENCOUNTER — Ambulatory Visit: Payer: 59 | Admitting: Physician Assistant

## 2013-09-09 ENCOUNTER — Telehealth: Payer: Self-pay | Admitting: Physician Assistant

## 2013-09-09 ENCOUNTER — Other Ambulatory Visit: Payer: 59

## 2013-09-09 DIAGNOSIS — R609 Edema, unspecified: Secondary | ICD-10-CM

## 2013-09-09 DIAGNOSIS — M79609 Pain in unspecified limb: Secondary | ICD-10-CM | POA: Insufficient documentation

## 2013-09-09 DIAGNOSIS — C50919 Malignant neoplasm of unspecified site of unspecified female breast: Secondary | ICD-10-CM

## 2013-09-09 DIAGNOSIS — R600 Localized edema: Secondary | ICD-10-CM

## 2013-09-09 DIAGNOSIS — C773 Secondary and unspecified malignant neoplasm of axilla and upper limb lymph nodes: Secondary | ICD-10-CM | POA: Insufficient documentation

## 2013-09-09 NOTE — Telephone Encounter (Signed)
Per staff message and POF I have scheduled appts.  JMW  

## 2013-09-09 NOTE — Telephone Encounter (Signed)
per pof to sch pt appts & trmt for 6/9-cld & left pt a mess for the trmt time and date as well as appt& lab

## 2013-09-09 NOTE — Telephone Encounter (Signed)
Called to let pt know Preg test was negative. Pt was relieved. Pt  shared with me that she got test results back and she has no DVT's. Message to be forwarded to [employer], PA-C.

## 2013-09-09 NOTE — Progress Notes (Signed)
Bilateral lower extremity venous duplex:  No evidence of DVT, superficial thrombosis, or Baker's cyst.   

## 2013-09-12 ENCOUNTER — Ambulatory Visit (HOSPITAL_BASED_OUTPATIENT_CLINIC_OR_DEPARTMENT_OTHER): Payer: 59

## 2013-09-12 ENCOUNTER — Other Ambulatory Visit (HOSPITAL_BASED_OUTPATIENT_CLINIC_OR_DEPARTMENT_OTHER): Payer: 59

## 2013-09-12 VITALS — BP 115/81 | HR 94 | Temp 98.5°F | Resp 18

## 2013-09-12 DIAGNOSIS — Z5111 Encounter for antineoplastic chemotherapy: Secondary | ICD-10-CM

## 2013-09-12 DIAGNOSIS — D649 Anemia, unspecified: Secondary | ICD-10-CM

## 2013-09-12 DIAGNOSIS — C773 Secondary and unspecified malignant neoplasm of axilla and upper limb lymph nodes: Secondary | ICD-10-CM

## 2013-09-12 DIAGNOSIS — C50919 Malignant neoplasm of unspecified site of unspecified female breast: Secondary | ICD-10-CM

## 2013-09-12 LAB — COMPREHENSIVE METABOLIC PANEL (CC13)
ALBUMIN: 3.3 g/dL — AB (ref 3.5–5.0)
ALK PHOS: 71 U/L (ref 40–150)
ALT: 42 U/L (ref 0–55)
AST: 33 U/L (ref 5–34)
Anion Gap: 11 mEq/L (ref 3–11)
BUN: 12 mg/dL (ref 7.0–26.0)
CALCIUM: 9.7 mg/dL (ref 8.4–10.4)
CO2: 23 mEq/L (ref 22–29)
Chloride: 109 mEq/L (ref 98–109)
Creatinine: 0.8 mg/dL (ref 0.6–1.1)
GLUCOSE: 124 mg/dL (ref 70–140)
POTASSIUM: 4.1 meq/L (ref 3.5–5.1)
Sodium: 143 mEq/L (ref 136–145)
Total Bilirubin: <0.2 mg/dL (ref 0.20–1.20)
Total Protein: 6.6 g/dL (ref 6.4–8.3)

## 2013-09-12 LAB — CBC WITH DIFFERENTIAL/PLATELET
BASO%: 1.2 % (ref 0.0–2.0)
BASOS ABS: 0 10*3/uL (ref 0.0–0.1)
EOS%: 0.1 % (ref 0.0–7.0)
Eosinophils Absolute: 0 10*3/uL (ref 0.0–0.5)
HCT: 33 % — ABNORMAL LOW (ref 34.8–46.6)
HGB: 10.5 g/dL — ABNORMAL LOW (ref 11.6–15.9)
LYMPH%: 39.5 % (ref 14.0–49.7)
MCH: 28 pg (ref 25.1–34.0)
MCHC: 31.7 g/dL (ref 31.5–36.0)
MCV: 88.4 fL (ref 79.5–101.0)
MONO#: 0.4 10*3/uL (ref 0.1–0.9)
MONO%: 10.1 % (ref 0.0–14.0)
NEUT#: 1.7 10*3/uL (ref 1.5–6.5)
NEUT%: 49.1 % (ref 38.4–76.8)
PLATELETS: 186 10*3/uL (ref 145–400)
RBC: 3.74 10*6/uL (ref 3.70–5.45)
RDW: 18.4 % — AB (ref 11.2–14.5)
WBC: 3.6 10*3/uL — AB (ref 3.9–10.3)
lymph#: 1.4 10*3/uL (ref 0.9–3.3)

## 2013-09-12 MED ORDER — HEPARIN SOD (PORK) LOCK FLUSH 100 UNIT/ML IV SOLN
500.0000 [IU] | Freq: Once | INTRAVENOUS | Status: AC | PRN
  Administered 2013-09-12: 500 [IU]
  Filled 2013-09-12: qty 5

## 2013-09-12 MED ORDER — ONDANSETRON 16 MG/50ML IVPB (CHCC)
INTRAVENOUS | Status: AC
Start: 2013-09-12 — End: 2013-09-12
  Filled 2013-09-12: qty 16

## 2013-09-12 MED ORDER — SODIUM CHLORIDE 0.9 % IV SOLN
Freq: Once | INTRAVENOUS | Status: AC
  Administered 2013-09-12: 10:00:00 via INTRAVENOUS

## 2013-09-12 MED ORDER — DOCETAXEL CHEMO INJECTION 160 MG/16ML
75.0000 mg/m2 | Freq: Once | INTRAVENOUS | Status: AC
  Administered 2013-09-12: 160 mg via INTRAVENOUS
  Filled 2013-09-12: qty 16

## 2013-09-12 MED ORDER — SODIUM CHLORIDE 0.9 % IV SOLN
881.4000 mg | Freq: Once | INTRAVENOUS | Status: AC
  Administered 2013-09-12: 880 mg via INTRAVENOUS
  Filled 2013-09-12: qty 88

## 2013-09-12 MED ORDER — DEXAMETHASONE SODIUM PHOSPHATE 20 MG/5ML IJ SOLN
INTRAMUSCULAR | Status: AC
  Filled 2013-09-12: qty 5

## 2013-09-12 MED ORDER — SODIUM CHLORIDE 0.9 % IJ SOLN
10.0000 mL | INTRAMUSCULAR | Status: DC | PRN
  Administered 2013-09-12: 10 mL
  Filled 2013-09-12: qty 10

## 2013-09-12 MED ORDER — DEXAMETHASONE SODIUM PHOSPHATE 20 MG/5ML IJ SOLN
20.0000 mg | Freq: Once | INTRAMUSCULAR | Status: AC
  Administered 2013-09-12: 20 mg via INTRAVENOUS

## 2013-09-12 MED ORDER — ONDANSETRON 16 MG/50ML IVPB (CHCC)
16.0000 mg | Freq: Once | INTRAVENOUS | Status: AC
  Administered 2013-09-12: 16 mg via INTRAVENOUS

## 2013-09-12 NOTE — Patient Instructions (Signed)
[facility] Discharge Instructions for Patients Receiving Chemotherapy  Today you received the following chemotherapy agents: Taxotere and Carboplatin  To help prevent nausea and vomiting after your treatment, we encourage you to take your nausea medication as prescribed.   If you develop nausea and vomiting that is not controlled by your nausea medication, call the clinic.   BELOW ARE SYMPTOMS THAT SHOULD BE REPORTED IMMEDIATELY:  *FEVER GREATER THAN 100.5 F  *CHILLS WITH OR WITHOUT FEVER  NAUSEA AND VOMITING THAT IS NOT CONTROLLED WITH YOUR NAUSEA MEDICATION  *UNUSUAL SHORTNESS OF BREATH  *UNUSUAL BRUISING OR BLEEDING  TENDERNESS IN MOUTH AND THROAT WITH OR WITHOUT PRESENCE OF ULCERS  *URINARY PROBLEMS  *BOWEL PROBLEMS  UNUSUAL RASH Items with * indicate a potential emergency and should be followed up as soon as possible.  Feel free to call the clinic you have any questions or concerns. The clinic phone number is [phone].    

## 2013-09-13 ENCOUNTER — Encounter: Payer: Self-pay | Admitting: *Deleted

## 2013-09-13 ENCOUNTER — Ambulatory Visit (HOSPITAL_BASED_OUTPATIENT_CLINIC_OR_DEPARTMENT_OTHER): Payer: 59

## 2013-09-13 VITALS — BP 127/86 | HR 102 | Temp 98.6°F

## 2013-09-13 DIAGNOSIS — C773 Secondary and unspecified malignant neoplasm of axilla and upper limb lymph nodes: Secondary | ICD-10-CM

## 2013-09-13 DIAGNOSIS — C50919 Malignant neoplasm of unspecified site of unspecified female breast: Secondary | ICD-10-CM

## 2013-09-13 DIAGNOSIS — Z5189 Encounter for other specified aftercare: Secondary | ICD-10-CM

## 2013-09-13 MED ORDER — PEGFILGRASTIM INJECTION 6 MG/0.6ML
6.0000 mg | Freq: Once | SUBCUTANEOUS | Status: AC
  Administered 2013-09-13: 6 mg via SUBCUTANEOUS
  Filled 2013-09-13: qty 0.6

## 2013-09-13 NOTE — Progress Notes (Signed)
Per patient request, faxed Doppler study 09/09/13 to [facility] [place] @ [phone], claim [id number]

## 2013-09-22 ENCOUNTER — Other Ambulatory Visit (HOSPITAL_BASED_OUTPATIENT_CLINIC_OR_DEPARTMENT_OTHER): Payer: 59

## 2013-09-22 ENCOUNTER — Encounter: Payer: Self-pay | Admitting: Physician Assistant

## 2013-09-22 ENCOUNTER — Ambulatory Visit (HOSPITAL_BASED_OUTPATIENT_CLINIC_OR_DEPARTMENT_OTHER): Payer: 59 | Admitting: Physician Assistant

## 2013-09-22 ENCOUNTER — Telehealth: Payer: Self-pay | Admitting: Physician Assistant

## 2013-09-22 VITALS — BP 121/72 | HR 103 | Temp 98.8°F | Resp 18 | Ht 67.0 in | Wt 214.2 lb

## 2013-09-22 DIAGNOSIS — F3289 Other specified depressive episodes: Secondary | ICD-10-CM

## 2013-09-22 DIAGNOSIS — C773 Secondary and unspecified malignant neoplasm of axilla and upper limb lymph nodes: Principal | ICD-10-CM

## 2013-09-22 DIAGNOSIS — D649 Anemia, unspecified: Secondary | ICD-10-CM

## 2013-09-22 DIAGNOSIS — F419 Anxiety disorder, unspecified: Secondary | ICD-10-CM

## 2013-09-22 DIAGNOSIS — C50919 Malignant neoplasm of unspecified site of unspecified female breast: Secondary | ICD-10-CM

## 2013-09-22 DIAGNOSIS — D6959 Other secondary thrombocytopenia: Secondary | ICD-10-CM | POA: Insufficient documentation

## 2013-09-22 DIAGNOSIS — F329 Major depressive disorder, single episode, unspecified: Secondary | ICD-10-CM

## 2013-09-22 DIAGNOSIS — E860 Dehydration: Secondary | ICD-10-CM

## 2013-09-22 DIAGNOSIS — F411 Generalized anxiety disorder: Secondary | ICD-10-CM

## 2013-09-22 DIAGNOSIS — F32A Depression, unspecified: Secondary | ICD-10-CM

## 2013-09-22 DIAGNOSIS — R600 Localized edema: Secondary | ICD-10-CM

## 2013-09-22 DIAGNOSIS — M79609 Pain in unspecified limb: Secondary | ICD-10-CM

## 2013-09-22 DIAGNOSIS — I890 Lymphedema, not elsewhere classified: Secondary | ICD-10-CM

## 2013-09-22 DIAGNOSIS — M79622 Pain in left upper arm: Secondary | ICD-10-CM

## 2013-09-22 DIAGNOSIS — T451X5A Adverse effect of antineoplastic and immunosuppressive drugs, initial encounter: Secondary | ICD-10-CM

## 2013-09-22 DIAGNOSIS — R609 Edema, unspecified: Secondary | ICD-10-CM

## 2013-09-22 LAB — COMPREHENSIVE METABOLIC PANEL (CC13)
ALBUMIN: 3.2 g/dL — AB (ref 3.5–5.0)
ALT: 18 U/L (ref 0–55)
ANION GAP: 11 meq/L (ref 3–11)
AST: 17 U/L (ref 5–34)
Alkaline Phosphatase: 92 U/L (ref 40–150)
BUN: 7 mg/dL (ref 7.0–26.0)
CO2: 22 meq/L (ref 22–29)
Calcium: 9.6 mg/dL (ref 8.4–10.4)
Chloride: 109 mEq/L (ref 98–109)
Creatinine: 0.8 mg/dL (ref 0.6–1.1)
Glucose: 130 mg/dl (ref 70–140)
POTASSIUM: 3.9 meq/L (ref 3.5–5.1)
SODIUM: 142 meq/L (ref 136–145)
TOTAL PROTEIN: 6.1 g/dL — AB (ref 6.4–8.3)

## 2013-09-22 LAB — CBC WITH DIFFERENTIAL/PLATELET
BASO%: 0.3 % (ref 0.0–2.0)
Basophils Absolute: 0 10*3/uL (ref 0.0–0.1)
EOS%: 0.2 % (ref 0.0–7.0)
Eosinophils Absolute: 0 10*3/uL (ref 0.0–0.5)
HCT: 31.5 % — ABNORMAL LOW (ref 34.8–46.6)
HGB: 10 g/dL — ABNORMAL LOW (ref 11.6–15.9)
LYMPH%: 25.6 % (ref 14.0–49.7)
MCH: 28.5 pg (ref 25.1–34.0)
MCHC: 31.7 g/dL (ref 31.5–36.0)
MCV: 89.9 fL (ref 79.5–101.0)
MONO#: 0.5 10*3/uL (ref 0.1–0.9)
MONO%: 6.8 % (ref 0.0–14.0)
NEUT%: 67.1 % (ref 38.4–76.8)
NEUTROS ABS: 5.1 10*3/uL (ref 1.5–6.5)
PLATELETS: 142 10*3/uL — AB (ref 145–400)
RBC: 3.51 10*6/uL — AB (ref 3.70–5.45)
RDW: 18 % — ABNORMAL HIGH (ref 11.2–14.5)
WBC: 7.6 10*3/uL (ref 3.9–10.3)
lymph#: 1.9 10*3/uL (ref 0.9–3.3)

## 2013-09-22 NOTE — Telephone Encounter (Signed)
per pof to sch pt IV trmt-sent email to MW to sch -adv pt will call w/appt-pt stated has [place] CHART and will check on Friday-adv PT will call & sch appt w/her-pt understood

## 2013-09-22 NOTE — Progress Notes (Signed)
[place]  [phone]) [phone] [phone]) [phone]     ID: [name] OB: September 29, 1975  MR#: [id number]  OVZ#:[id number]  PCP: Colvin Caroli GYN:   SU: Alphonsa Overall OTHER MD: Lonia Chimera [phone])  CHIEF COMPLAINT:  Hx of Left Breast Cancer with Left Axillary Recurrence/Receiving Chemotherapy  (Taxotere/Carbo)   HISTORY OF PRESENT ILLNESS: [name] had bilateral diagnostic mammography [facility] in [place] 11/30/2009 showing a 1.5 cm mass at the 9:00 position of the left breast with some satellites. A biopsy of the mass in question [facility] showed ([id number]) and invasive lobular carcinoma, which was estrogen and progesterone receptor both 100% positive, both with strong staining intensity, but HER-2 negative at 1+. Bilateral breast MRI 12/13/2009 in [place] showed in the left breast an irregularly marginated mass measuring 3.2 cm. There were 4 or 5 separate satellite lesions laterally and superior to the primary.  Accordingly, after appropriate discussion, the patient underwent bilateral mastectomies with left sentinel lymph node sampling 01/02/2010. The right breast was benign. The left breast showed a 2.3 cm invasive lobular carcinoma, grade 3, with a total of 6 sentinel lymph nodes removed, all negative, although 2 showed isolated tumor cells by immunostaining. Margins were negative.  An Oncotype BX was sent, with a recurrence score of 22, predicting a risk of distant recurrence within 10 years with 14% of the patients only systemic treatment was tamoxifen. The patient was treated with CMF chemotherapy between 02/22/2010 and 07/05/2010, receiving 7 cycles at which point the patient refused further chemotherapy though there have been no major toxicities at according to the oncology note from Dr. Brent General. The patient was then started on tamoxifen 08/09/2010. By her cancer took approximately 12-15  months then stopped because of hot flashes and insomnia problems.  More recently, the patient noted a change in her left axilla andunderwent bilateral breast MRI January to 2015 at [facility]. The patient has bilateral submuscular silicone implants in place. The breast were unremarkable, but there were several lymph nodes with cortical thickening in the left axilla including a dominant 1 measuring 1.2 cm in the short axis. Ultrasonography of this area identified the lymph node in question and the patient underwent biopsy of the left axillary lymph node This showed ([id number]) near-total replacement of the lymph node by metastatic carcinoma. This was 96% estrogen receptor positive, and 84% progesterone receptor positive, both with strong staining intensity. The MIB-1 was 20%. HER-2 determination is pending.  The patient's subsequent history is as detailed below  INTERVAL HISTORY: [name] returns alone today for followup of her left breast cancer with left axillary recurrence. She continues to receive docetaxel/carboplatin every 3 weeks, and is currently day 11 cycle 4 of 6 planned cycles. She receives Neulasta on day 2 for granulocyte support.  [name] has had a very difficult time tolerating this chemotherapy. In fact when I saw her on May 14, we discussed the possibility of discontinuing the docetaxel and switching to a carboplatin/gemcitabine regimen. She did not want to make that change. Accordingly, she received the docetaxel and carboplatin as planned last week on 09/12/2013.  [name] has had quite a bit of swelling in the lower extremities, but fortunately that has improved significantly since last week. She is taking furosemide every other day as needed for the swelling, but is being careful to keep herself well hydrated. I will note that bilateral Dopplers last week were negative for DVT.   We also increased [name] venlafaxine from 75-150  mg last week, and she tells me she can "already  tell some improvement". She still has some anxiety, but has less depression. She continues to deny any suicidal ideation.   REVIEW OF SYSTEMS: [name] has had no fevers or chills but does have hot flashes and night sweats. She has moderate fatigue. She continues to have chronic pain in her left arm, left axilla, and left upper back. It does limit many of her day-to-day activities. She has swelling in the left arm as well, and has not yet been evaluated by the lymphedema clinic. Interval his appetite has been good, although she has had some intermittent nausea and abdominal cramping. She's had no actual emesis. She's having regular bowel movements and denies any change in urinary habits. She's had no increased cough, shortness of breath, orthopnea, chest pain, or palpitations. She has occasional headaches which have not worsened. She has no associated dizziness or change in vision.  She's had no increased peripheral neuropathy, and has only some occasional tingling in the fingers which comes and goes. This has been stable for the last several cycles.   A detailed review of systems is otherwise stable and noncontributory.    PAST MEDICAL HISTORY: Past Medical History  Diagnosis Date  . Cancer     breast ca    PAST SURGICAL HISTORY: Past Surgical History  Procedure Laterality Date  . Bilateral total mastectomy with axillary lymph node dissection    . Tonsillectomy    . Leep    . Portacath placement N/A 06/16/2013    Procedure: POWER PORT PLACEMENT;  Surgeon: Shann Medal, MD;  Location: [facility];  Service: General;  Laterality: N/A;  . Axillary lymph node dissection Left 06/16/2013    Procedure: LEFT AXILLARY NODE DISSECTION;  Surgeon: Shann Medal, MD;  Location: [facility];  Service: General;  Laterality: Left;    FAMILY HISTORY Family History  Problem Relation Age of Onset  . Other Mother     glioblastoma  . Other Father     glioblastoma   according to the patient both her parents died  from a primary brain cancers, namely we have the stomas, her father at 53, her mother at 67. The patient had one brother and one sister. There is no history of breast or ovarian cancer in the family  GYNECOLOGIC HISTORY:   (Reviewed 09/22/2013) Menarche age 46. The patient has never carried a child to term. She was still having regular periods at the time of her recent diagnosis. LMP 07/18/2013 as of 09/08/2013.  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 She shares a home with her significant other, Burnice Logan, who works at installing cisterns. They have no pets.    ADVANCED DIRECTIVES: Not in place   HEALTH MAINTENANCE:  (Reviewed 09/22/2013) History  Substance Use Topics  . Smoking status: Never Smoker   . Smokeless tobacco: Never Used  . Alcohol Use: No     Colonoscopy: Never  PAP: December 2014  Bone density: Never  Lipid panel: Not on file   Allergies  Allergen Reactions  . Sulfa Antibiotics Swelling  . Gadolinium Derivatives Hives    After gado injection, pt had a few small hives on left breast area. Treated with benadryl by dr Janeece Fitting   . Petrolatum Rash  Current Outpatient Prescriptions  Medication Sig Dispense Refill  . cholestyramine (QUESTRAN) 4 G packet Take 1 packet (4 g total) by mouth 2 (two) times daily as needed. For diarrhea (Take with food)  30 each  0  . dicyclomine (BENTYL) 10 MG capsule Take 1 capsule (10 mg total) by mouth 4 (four) times daily -  before meals and at bedtime.  60 capsule  1  . lidocaine-prilocaine (EMLA) cream Apply 1 application topically as needed. 1-2 hours before each port access  30 g  2  . metaxalone (SKELAXIN) 800 MG tablet Take 1 tablet (800 mg total) by mouth 3 (three) times daily as needed for muscle spasms.  60  tablet  0  . omeprazole (PRILOSEC) 40 MG capsule Take 1 capsule (40 mg total) by mouth daily.  90 capsule  3  . potassium chloride (MICRO-K) 10 MEQ CR capsule Take 1 capsule (10 mEq total) by mouth 2 (two) times daily.  60 capsule  2  . prochlorperazine (COMPAZINE) 10 MG tablet 1 tab by mouth with meals and bedtime x 3 days after chemo, then 1 tab by mouth every 6 hrs if needed for nausea  30 tablet  2  . 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 venlafaxine XR (EFFEXOR-XR) 150 MG 24 hr capsule Take 1 capsule (150 mg total) by mouth daily.  30 capsule  3  . dexamethasone (DECADRON) 4 MG tablet 2 tabs by mouth with food twice daily on day before and 3 days after each chemo  45 tablet  1  . furosemide (LASIX) 20 MG tablet Take 1 tablet (20 mg total) by mouth every other day. As needed for swelling  10 tablet  0  . HYDROcodone-acetaminophen (NORCO/VICODIN) 5-325 MG per tablet Take 1-2 tablets by mouth every 6 (six) hours as needed.  30 tablet  0  . LORazepam (ATIVAN) 0.5 MG tablet Take 1 tablet (0.5 mg total) by mouth at bedtime as needed for anxiety or sleep (or nausea).  30 tablet  0  . ondansetron (ZOFRAN) 8 MG tablet 1 tab by mouth twice daily x 3 days after each chemo, then 1 tab by mouth every 12 hrs if needed for nausea  30 tablet  1  . prochlorperazine (COMPAZINE) 25 MG suppository Place 1 suppository (25 mg total) rectally every 12 (twelve) hours as needed for nausea or vomiting.  12 suppository  0  . traMADol (ULTRAM) 50 MG tablet Take 1 tablet (50 mg total) by mouth every 6 (six) hours as needed for moderate pain.  60 tablet  0   No current facility-administered medications for this visit.    OBJECTIVE: [name] who appears uncomfortable and tired.  Filed Vitals:   09/22/13 0839  BP: 121/72  Pulse: 103  Temp: 98.8 F (37.1 C)  Resp: 18     Body mass index is 33.54 kg/(m^2).    ECOG FS:1 -  Symptomatic but completely ambulatory Filed Weights   09/22/13 0839  Weight: 214 lb 3.2 oz (97.16 kg)   Physical Exam: HEENT:  Sclerae anicteric.  Oropharynx clear, pink, and moist. No evidence of mucositis or candidiasis. Neck supple, trachea midline. NODES:  No cervical or supraclavicular lymphadenopathy palpated.  BREAST EXAM:  Breasts exam deferred. No palpable lymphadenopathy in your the right or left axilla. LUNGS:  Clear to auscultation bilaterally.  No wheezes or rhonchi HEART:  Regular rate and rhythm.  No murmur appreciated. ABDOMEN:  Soft, nontender. No guarding or rebound tenderness. No organomegaly or masses palpated. Positive bowel sounds.  MSK:  No focal spinal tenderness to palpation. Limited range of motion in the left upper extremity due to pain. EXTREMITIES: Pedal edema has resolved. There is mild nonpitting lymphedema in the left upper extremity. No swelling in the right upper extremity. SKIN:  Skin is hyperpigmented on the hands bilaterally. Otherwise, no skin changes. No excessive ecchymoses. No petechiae. NEURO:  Nonfocal. Well oriented.  Anxious affect.      LAB RESULTS:   Lab Results  Component Value Date   WBC 7.6 09/22/2013   NEUTROABS 5.1 09/22/2013   HGB 10.0* 09/22/2013   HCT 31.5* 09/22/2013   MCV 89.9 09/22/2013   PLT 142* 09/22/2013      Chemistry      Component Value Date/Time   NA 142 09/22/2013 0820   NA 135* 07/16/2013 0535   K 3.9 09/22/2013 0820   K 4.4 07/16/2013 0535   CL 97 07/16/2013 0535   CO2 22 09/22/2013 0820   CO2 25 07/16/2013 0535   BUN 7.0 09/22/2013 0820   BUN 11 07/16/2013 0535   CREATININE 0.8 09/22/2013 0820   CREATININE 0.81 07/16/2013 0535      Component Value Date/Time   CALCIUM 9.6 09/22/2013 0820   CALCIUM 10.1 07/16/2013 0535   ALKPHOS 92 09/22/2013 0820   ALKPHOS 92 07/16/2013 0535   AST 17 09/22/2013 0820   AST 16 07/16/2013 0535   ALT 18 09/22/2013 0820   ALT 14 07/16/2013 0535   BILITOT <0.20 09/22/2013 0820   BILITOT  0.9 07/16/2013 0535       STUDIES: Dg Abd 1 View 08/04/2013   CLINICAL DATA:  38 year old female with breast cancer with lymph node involvement. No bowel movement x3 days, constipation. Initial encounter.  EXAM: ABDOMEN - 1 VIEW  COMPARISON:  PET-CT 05/27/2013.  FINDINGS: Supine KUB. Non obstructed bowel gas pattern. Volume of retained stool in the colon does appear increased from the comparison. Abdominal and pelvic visceral contours are within normal limits. Proximal right femur ORIF hardware re- identified. Stable visualized osseous structures.  IMPRESSION: Non obstructed bowel gas pattern, but with increased volume of retained stool in the colon.   Electronically Signed   By: Lars Pinks M.D.   On: 08/04/2013 11:59     ASSESSMENT: 38 y.o. [place] Bell  (1) status post bilateral mastectomies with left axillary lymph node sampling [6 nodes removed] 01/02/2010 for a pT2 pN0(i+), stage IIA invasive lobular breast cancer, grade 3, estrogen and progesterone receptor positive, HER-2 not amplified; right breast was benign  (2) Oncotype DX score of 22 predicted a 14% risk of distant recurrence within 10 years if the patient's only systemic treatment is tamoxifen for 5 years  (3) status post CMF(cyclophosphamide, fluorouracil, methotrexate) x7 given between October of 2011 and March of 2012 (7 of 8 planned treatments completed)  (4) tamoxifen started April 2012, discontinued approximately July of 2013 (about 15 months) because of problems with hot flashes and insomnia  (5) pathologically documented left axillary recurrence 05/05/2013, the tumor being again estrogen and progesterone receptor positive, with HER-2 not amplified, and MIB-1 of 15%. Staging CT/PET 05/27/2013 show left axillary and supraclavicular recurrence, no distant disease  (6) status post completion left axillary lymph node dissection 06/16/2013 showing a total of 10/15 lymph nodes involved by tumor, with evidence of extracapsular  extension (TX N3 = stage IIIC)  (7) being treated with carboplatin/  docetaxel, first dose on 07/11/2013, to be repeated every 21 days x 6 with Neulasta support on day 2  (8) radiation to follow chemotherapy  (9) bilateral salpingo-oophorectomy planned for later this year, followed by aromatase inhibitor therapy for at least 5 years  (10)  depression, being treated with Effexor XR plus counseling  (11) GI symptoms, consistent with IBS - improved  (12)  pitting edema bilaterally in the lower extremities - secondary to Taxotere/dexamethasone- improved     PLAN: Overall, [name] feels that she is through the "worst part" of this cycle. She tends to feel the worst on Friday and Saturday following her infusion on Monday. She has a very difficult time eating and drinking during that period of time, and we discussed the possibility of bringing her in on Friday, June 12, after her fifth cycle of chemotherapy on June 8 for supportive IV fluids. Of course we can also get of her IV antinausea medications at that time if necessary. She is very much in favor of that plan.   Letecia will return accordingly on June 8 for labs, followup exam, and her fifth dose of docetaxel/carboplatin. We again discussed the option of discontinuing the docetaxel and substituting gemcitabine, but again she declines. She would like to continue with her current regimen and "just get it over with" as long she continues to tolerate it reasonably well. Of course we'll continue to follow her closely, and we'll also continue to assess for increased swelling and increased peripheral neuropathy.  I am going to refer [name] to the lymphedema clinic for further evaluation of her left upper extremity, in the hopes that they can decrease lymphedema and also increase her range of motion. Hopefully this would also decrease her pain.   The above plan has been reviewed in detail with [name] today. She voiced her understanding and agreement  with this plan and will call with any changes or problems.   Stacy Burrow, PA-C   09/22/2013 6:08 PM

## 2013-09-26 NOTE — Telephone Encounter (Signed)
per pof to sch pt BD-sch @ [facility]-gave pt time & date and  address-sch appt-gave pt copy of sch °

## 2013-09-28 ENCOUNTER — Ambulatory Visit: Payer: 59 | Attending: Physician Assistant | Admitting: Physical Therapy

## 2013-09-28 DIAGNOSIS — IMO0001 Reserved for inherently not codable concepts without codable children: Secondary | ICD-10-CM | POA: Insufficient documentation

## 2013-09-28 DIAGNOSIS — I890 Lymphedema, not elsewhere classified: Secondary | ICD-10-CM | POA: Insufficient documentation

## 2013-09-28 DIAGNOSIS — M24519 Contracture, unspecified shoulder: Secondary | ICD-10-CM | POA: Insufficient documentation

## 2013-10-02 ENCOUNTER — Other Ambulatory Visit: Payer: Self-pay | Admitting: Oncology

## 2013-10-03 ENCOUNTER — Ambulatory Visit: Payer: 59

## 2013-10-03 ENCOUNTER — Other Ambulatory Visit (HOSPITAL_BASED_OUTPATIENT_CLINIC_OR_DEPARTMENT_OTHER): Payer: 59

## 2013-10-03 ENCOUNTER — Other Ambulatory Visit: Payer: 59

## 2013-10-03 ENCOUNTER — Ambulatory Visit (HOSPITAL_BASED_OUTPATIENT_CLINIC_OR_DEPARTMENT_OTHER): Payer: 59 | Admitting: Oncology

## 2013-10-03 ENCOUNTER — Telehealth: Payer: Self-pay | Admitting: *Deleted

## 2013-10-03 ENCOUNTER — Telehealth: Payer: Self-pay | Admitting: Oncology

## 2013-10-03 ENCOUNTER — Encounter: Payer: Self-pay | Admitting: Oncology

## 2013-10-03 VITALS — BP 115/79 | HR 94 | Temp 98.6°F | Resp 20 | Ht 67.0 in | Wt 217.3 lb

## 2013-10-03 DIAGNOSIS — C50919 Malignant neoplasm of unspecified site of unspecified female breast: Secondary | ICD-10-CM

## 2013-10-03 DIAGNOSIS — M545 Low back pain, unspecified: Secondary | ICD-10-CM

## 2013-10-03 DIAGNOSIS — N951 Menopausal and female climacteric states: Secondary | ICD-10-CM

## 2013-10-03 DIAGNOSIS — C773 Secondary and unspecified malignant neoplasm of axilla and upper limb lymph nodes: Secondary | ICD-10-CM

## 2013-10-03 DIAGNOSIS — C770 Secondary and unspecified malignant neoplasm of lymph nodes of head, face and neck: Secondary | ICD-10-CM

## 2013-10-03 DIAGNOSIS — D649 Anemia, unspecified: Secondary | ICD-10-CM

## 2013-10-03 DIAGNOSIS — Z170 Estrogen receptor positive status [ER+]: Secondary | ICD-10-CM

## 2013-10-03 LAB — COMPREHENSIVE METABOLIC PANEL (CC13)
ALT: 111 U/L — AB (ref 0–55)
AST: 65 U/L — ABNORMAL HIGH (ref 5–34)
Albumin: 3.2 g/dL — ABNORMAL LOW (ref 3.5–5.0)
Alkaline Phosphatase: 66 U/L (ref 40–150)
Anion Gap: 8 mEq/L (ref 3–11)
BILIRUBIN TOTAL: 0.43 mg/dL (ref 0.20–1.20)
BUN: 11.4 mg/dL (ref 7.0–26.0)
CO2: 25 mEq/L (ref 22–29)
Calcium: 9.6 mg/dL (ref 8.4–10.4)
Chloride: 108 mEq/L (ref 98–109)
Creatinine: 0.8 mg/dL (ref 0.6–1.1)
Glucose: 131 mg/dl (ref 70–140)
Potassium: 3.9 mEq/L (ref 3.5–5.1)
SODIUM: 141 meq/L (ref 136–145)
TOTAL PROTEIN: 6.4 g/dL (ref 6.4–8.3)

## 2013-10-03 LAB — CBC WITH DIFFERENTIAL/PLATELET
BASO%: 0.8 % (ref 0.0–2.0)
Basophils Absolute: 0 10*3/uL (ref 0.0–0.1)
EOS%: 0.4 % (ref 0.0–7.0)
Eosinophils Absolute: 0 10*3/uL (ref 0.0–0.5)
HEMATOCRIT: 32.7 % — AB (ref 34.8–46.6)
HGB: 10.5 g/dL — ABNORMAL LOW (ref 11.6–15.9)
LYMPH#: 1.4 10*3/uL (ref 0.9–3.3)
LYMPH%: 50.7 % — AB (ref 14.0–49.7)
MCH: 28.9 pg (ref 25.1–34.0)
MCHC: 32 g/dL (ref 31.5–36.0)
MCV: 90.5 fL (ref 79.5–101.0)
MONO#: 0.3 10*3/uL (ref 0.1–0.9)
MONO%: 8.9 % (ref 0.0–14.0)
NEUT#: 1.1 10*3/uL — ABNORMAL LOW (ref 1.5–6.5)
NEUT%: 39.2 % (ref 38.4–76.8)
Platelets: 174 10*3/uL (ref 145–400)
RBC: 3.61 10*6/uL — AB (ref 3.70–5.45)
RDW: 20 % — ABNORMAL HIGH (ref 11.2–14.5)
WBC: 2.8 10*3/uL — AB (ref 3.9–10.3)

## 2013-10-03 NOTE — Telephone Encounter (Signed)
Per staff message and POF I have scheduled appts. Advised scheduler to move lab appt JMW  

## 2013-10-03 NOTE — Telephone Encounter (Signed)
per pof to cancel pt trmts, inj ZIVF and labs and resch for 6/15 for trmt & lab-sent MW email to rech trmts IV-resc inj labs made pt aware, Pt stated she is not going to sch [place] does not want it-adv  would note the account-Sent email to AB per LL to see if she wnts pt to keep appt with her on 6/17-adv pt i would call  with update. Pt stated she will check MY CHART on 10/04/12.for update

## 2013-10-03 NOTE — Progress Notes (Signed)
[place]  [phone] [phone]) [phone]     ID: [name] DOB: May 08, 1975  MR#: [id number]  YSA#:[id number]  PCP: Colvin Caroli GYN:   SU: Alphonsa Overall OTHER NA:TFTDD Katherine Roan (oncology, [place])   Patient is seen, for first time by this MD, at request of Dr Jana Hakim. EMR reviewed, case discussed with Dr Jana Hakim.and information reviewed with patient.  CHIEF COMPLAINT: left axillary recurrence of breast cancer, on docetaxel/carboplatin CURRENT TREATMENT: cycle 5 docetaxel/ carboplatin held today due to [place] 1.1  BREAST CANCER HISTORY: bilateral diagnostic mammography [facility] in [place] 11/30/2009 showed 1.5 cm mass at the 9:00 position of the left breast with some satellites. A biopsy ([id number]) showed invasive lobular carcinoma, which was estrogen and progesterone receptor both 100% positive, HER-2 negative at 1+. Bilateral breast MRI 12/13/2009 in [place] showed in the left breast an irregularly marginated mass measuring 3.2 cm. There were 4 or 5 separate satellite lesions laterally and superior to the primary. She had bilateral mastectomies with left sentinel lymph node sampling 01/02/2010. The right breast was benign. The left breast showed a 2.3 cm invasive lobular carcinoma, grade 3, with a total of 6 sentinel lymph nodes removed, all negative, although 2 showed isolated tumor cells by immunostaining. Margins were negative.  Oncotype DX had recurrence score of 22, predicting a risk of distant recurrence 14% in 10 years if only systemic treatment was tamoxifen. The patient was treated with CMF chemotherapy between 02/22/2010 and 07/05/2010, receiving 7 cycles at which point the patient refused further chemotherapy though no major toxicities according to the oncology note from Dr. Brent General. The patient was on tamoxifen from 08/09/2010 for approximately 12-15 months, stopped because of hot flashes and insomnia problems.    Patient noted a change in her left axilla, evaluated with bilateral breast MRI 1-5- 2015 at [place]. The patient has bilateral submuscular silicone implants in place. The breast were unremarkable, but there were several lymph nodes with cortical thickening in the left axilla including a dominant 1 measuring 1.2 cm in the short axis. Ultrasound biopsy of the left axillary lymph node showed ([id number]) near-total replacement of the lymph node by metastatic carcinoma. This was 96% estrogen receptor positive, and 84% progesterone receptor positive, both with strong staining intensity. The MIB-1 was 20%. HER-2 was negative by FISH. PET/CT chest [id number] had hypermetabolic left supraclavicular and left axillary nodes with no evidence of distant metastatic disease including bone. She had left axillary node dissection by Dr Lucia Gaskins 06-16-2013, with path [id number]) 10/15 nodes involved, with extranodal tumor. She began docetaxel and carboplatin cycle 1 on 07-11-13, with neulasta day 2.  Plan per prior notes is for RT following chemo, then BSO and aromatase inhibitor.   She has PAC, placed by Dr Alphonsa Overall in 04-2013.  INTERVAL HISTORY: COntinues with bothersome low back pain which she has had "since surgery in February", for which she uses skelexin once daily, usually in AMs. Some numbness in toes, not too bothersome. Nausea without vomiting for a week after treatment, uses regular zofran and compazine during that week, then only prn. She is able to keep down po's including fluids during that week. Hot flashes much more severe since beginning chemo, day and night; she cannot tell much improvement since starting Effexor (possibly May 2015). Sleeps poorly with hot flashes. Limited ROM LUE since node dissection, to begin some PT; she has been given some ROM exercises already. She feels that she is retaining fluid with chemo, tho denies  increased SOB. She has darkening of nails, no excessive lacrimation.  She denies fever or symptoms of infection, bleeding, other pain. LE venous dopplers negative 09-09-13. Pregnancy test negative May 2015.  REVIEW OF SYSTEMS:  PAST MEDICAL HISTORY: Past Medical History  Diagnosis Date  . Cancer     breast ca    PAST SURGICAL HISTORY: Past Surgical History  Procedure Laterality Date  . Bilateral total mastectomy with axillary lymph node dissection    . Tonsillectomy    . Leep    . Portacath placement N/A 06/16/2013    Procedure: POWER PORT PLACEMENT;  Surgeon: Shann Medal, MD;  Location: [facility];  Service: General;  Laterality: N/A;  . Axillary lymph node dissection Left 06/16/2013    Procedure: LEFT AXILLARY NODE DISSECTION;  Surgeon: Shann Medal, MD;  Location: [facility];  Service: General;  Laterality: Left;    FAMILY HISTORY Family History  Problem Relation Age of Onset  . Other Mother     glioblastoma  . Other Father     glioblastoma    GYNECOLOGIC HISTORY: menarche age 60. No term pregnancies. Regular periods as of Jan 2015.   SOCIAL HISTORY: lives with significant other.      ADVANCED DIRECTIVES:    HEALTH MAINTENANCE: History  Substance Use Topics  . Smoking status: Never Smoker   . Smokeless tobacco: Never Used  . Alcohol Use: No     Colonoscopy:  PAP:Dec 2014  Bone density:  Lipid panel:  Allergies  Allergen Reactions  . Sulfa Antibiotics Swelling  . Gadolinium Derivatives Hives    After gado injection, pt had a few small hives on left breast area. Treated with benadryl by dr Janeece Fitting   . Petrolatum Rash    Current Outpatient Prescriptions  Medication Sig Dispense Refill  . cholestyramine (QUESTRAN) 4 G packet Take 1 packet (4 g total) by mouth 2 (two) times daily as needed. For diarrhea (Take with food)  30 each  0  . dexamethasone (DECADRON) 4 MG tablet 2 tabs by mouth with food twice daily on day before and 3 days after each chemo  45 tablet  1  . furosemide (LASIX) 20 MG tablet Take 1 tablet (20 mg total)  by mouth every other day. As needed for swelling  10 tablet  0  . HYDROcodone-acetaminophen (NORCO/VICODIN) 5-325 MG per tablet Take 1-2 tablets by mouth every 6 (six) hours as needed.  30 tablet  0  . lidocaine-prilocaine (EMLA) cream Apply 1 application topically as needed. 1-2 hours before each port access  30 g  2  . metaxalone (SKELAXIN) 800 MG tablet Take 1 tablet (800 mg total) by mouth 3 (three) times daily as needed for muscle spasms.  60 tablet  0  . omeprazole (PRILOSEC) 40 MG capsule Take 1 capsule (40 mg total) by mouth daily.  90 capsule  3  . ondansetron (ZOFRAN) 8 MG tablet 1 tab by mouth twice daily x 3 days after each chemo, then 1 tab by mouth every 12 hrs if needed for nausea  30 tablet  1  . potassium chloride (MICRO-K) 10 MEQ CR capsule Take 1 capsule (10 mEq total) by mouth 2 (two) times daily.  60 capsule  2  . prochlorperazine (COMPAZINE) 10 MG tablet 1 tab by mouth with meals and bedtime x 3 days after chemo, then 1 tab by mouth every 6 hrs if needed for nausea  30 tablet  2  . venlafaxine XR (EFFEXOR-XR) 150 MG 24 hr  capsule Take 1 capsule (150 mg total) by mouth daily.  30 capsule  3  . dicyclomine (BENTYL) 10 MG capsule Take 1 capsule (10 mg total) by mouth 4 (four) times daily -  before meals and at bedtime.  60 capsule  1  . LORazepam (ATIVAN) 0.5 MG tablet Take 1 tablet (0.5 mg total) by mouth at bedtime as needed for anxiety or sleep (or nausea).  30 tablet  0  . prochlorperazine (COMPAZINE) 25 MG suppository Place 1 suppository (25 mg total) rectally every 12 (twelve) hours as needed for nausea or vomiting.  12 suppository  0  . traMADol (ULTRAM) 50 MG tablet Take 1 tablet (50 mg total) by mouth every 6 (six) hours as needed for moderate pain.  60 tablet  0   No current facility-administered medications for this visit.   DIscussed use of antiemetics, effexor for hot flashes, possible side effects of docetaxel including fluid retention and nail changes. Discussed  leukopenia secondary to chemotherapy, despite neulasta. Discussed use of steroids around taxotere to lessen fluid retention.    OBJECTIVE: Filed Vitals:   10/03/13 1011  BP: 115/79  Pulse: 94  Temp: 98.6 F (37 C)  Resp: 20     Body mass index is 34.03 kg/(m^2).    ECOG FS: 1  Pleasant lady looks stated age, upset re delay in chemotherapy due to counts. Ambulatory without difficulty, but obviously in pain in low back as she goes from sitting to supine on exam table. Alopecia.  Ocular: Sclerae unicteric, pupils equal, round and reactive to light. No excessive tearing Ear-nose-throat: Oropharynx clear, dentition without obvious problems. Mucous membranes moist. Lymphatic: No cervical or supraclavicular adenopathy appreciated. No inguinal adenopathy. Lungs no rales or rhonchi, good excursion bilaterally Heart regular rate and rhythm, no murmur appreciated, clear heart sounds Abd soft, nontender, positive bowel sounds. No appreciable HSM or mass. MSK no discreet tenderness LS to palpation, no joint edema. LE without pitting edema, cords, tenderness. 1+ swelling LUE without erythema or heat. Anterior axillary fold muscles tight on left, tender to palpation there. Neuro: non-focal including equal strength LE, well-oriented, appropriate affect Breasts: not undressed for breast exam. Skin: no rash across back. Nailbeds dark, cannot tell otherwise due to manicure   LAB RESULTS:  CMP     Component Value Date/Time   NA 141 10/03/2013 0957   NA 135* 07/16/2013 0535   K 3.9 10/03/2013 0957   K 4.4 07/16/2013 0535   CL 97 07/16/2013 0535   CO2 25 10/03/2013 0957   CO2 25 07/16/2013 0535   GLUCOSE 131 10/03/2013 0957   GLUCOSE 113* 07/16/2013 0535   BUN 11.4 10/03/2013 0957   BUN 11 07/16/2013 0535   CREATININE 0.8 10/03/2013 0957   CREATININE 0.81 07/16/2013 0535   CALCIUM 9.6 10/03/2013 0957   CALCIUM 10.1 07/16/2013 0535   PROT 6.4 10/03/2013 0957   PROT 8.4* 07/16/2013 0535   ALBUMIN 3.2* 10/03/2013 0957     ALBUMIN 4.0 07/16/2013 0535   AST 65* 10/03/2013 0957   AST 16 07/16/2013 0535   ALT 111* 10/03/2013 0957   ALT 14 07/16/2013 0535   ALKPHOS 66 10/03/2013 0957   ALKPHOS 92 07/16/2013 0535   BILITOT 0.43 10/03/2013 0957   BILITOT 0.9 07/16/2013 0535   GFRNONAA 90* 07/16/2013 0535   GFRAA >90 07/16/2013 0535      Lab Results  Component Value Date   WBC 2.8* 10/03/2013   NEUTROABS 1.1* 10/03/2013   HGB 10.5* 10/03/2013  HCT 32.7* 10/03/2013   MCV 90.5 10/03/2013   PLT 174 10/03/2013      Chemistry      Component Value Date/Time   NA 141 10/03/2013 0957   NA 135* 07/16/2013 0535   K 3.9 10/03/2013 0957   K 4.4 07/16/2013 0535   CL 97 07/16/2013 0535   CO2 25 10/03/2013 0957   CO2 25 07/16/2013 0535   BUN 11.4 10/03/2013 0957   BUN 11 07/16/2013 0535   CREATININE 0.8 10/03/2013 0957   CREATININE 0.81 07/16/2013 0535      Component Value Date/Time   CALCIUM 9.6 10/03/2013 0957   CALCIUM 10.1 07/16/2013 0535   ALKPHOS 66 10/03/2013 0957   ALKPHOS 92 07/16/2013 0535   AST 65* 10/03/2013 0957   AST 16 07/16/2013 0535   ALT 111* 10/03/2013 0957   ALT 14 07/16/2013 0535   BILITOT 0.43 10/03/2013 0957   BILITOT 0.9 07/16/2013 0535       Blood counts discussed with Dr Jana Hakim, who agrees with recommendation to delay cycle 5 chemotherapy for one week.   STUDIES: Last imaging of low back was with PET 04-2013. I have ordered LS spine films now.  ASSESSMENT/ PLAN:  1. Left breast invasive ductal carcinoma history as above, recurrent to left axillary nodes with extranodal extension and metastatic to left supraclavicular node, ER PR + and HER 2 negative in patient premenopausal at diagnosis: delay cycle 5 docetaxel carboplatin x 1 week due to [place] 1.1 today. She will have IVF on day 5 due to nausea and weakness on previous cycles. Plan is 6 cycles, then RT/ BSO/ AI. 2.low back pain: clearly symptomatic during exam now, etiology not known. Will get plain Xrays prior to next provider visit; may need MRI or other imaging  depending on those results. Continue skelexin; also has tramadol available. Pain not improved with steroids used premed for taxotere today. 3.PAC in 4.marked hot flashes: discussed pushing po fluids and keeping house cooler. May need additional meds for symptoms including sleep disturbance 5.limited ROM and some lymphedema LUE since axillary node dissection: PT to begin soon  Patient understood discussion and rationale for one week delay in chemotherapy, and agrees with plan above. She knows that she can call prior to next scheduled visit if needed. Chemo, neulasta and IVF orders moved. Time spent 35 min including >50% counseling and coordination of care.  Gordy Levan, MD   10/03/2013 11:22 AM

## 2013-10-04 ENCOUNTER — Ambulatory Visit: Payer: 59

## 2013-10-04 ENCOUNTER — Other Ambulatory Visit: Payer: 59

## 2013-10-05 ENCOUNTER — Telehealth: Payer: Self-pay | Admitting: Physician Assistant

## 2013-10-05 ENCOUNTER — Ambulatory Visit: Payer: 59

## 2013-10-05 ENCOUNTER — Encounter: Payer: Self-pay | Admitting: Physician Assistant

## 2013-10-05 NOTE — Telephone Encounter (Signed)
per ersponse back from [facility] message)cx 6/17 appt and add pt to her sch @9 :45 on 6/15 b4 Chemo-cld and left pt a message for appt imes & dates-ask pt to give me a call in re to Xrays & [place] to see if she will still take the appts per AB-AB stated will tlk w/pt when pt comes in for appt-Melissa chgd trmt time to accomendate the lab & appt-pt  aware

## 2013-10-07 ENCOUNTER — Ambulatory Visit: Payer: 59

## 2013-10-07 ENCOUNTER — Ambulatory Visit: Payer: 59 | Admitting: Physical Therapy

## 2013-10-07 ENCOUNTER — Encounter: Payer: Self-pay | Admitting: Oncology

## 2013-10-07 NOTE — Progress Notes (Signed)
Insurance is paying neulasta 100% °

## 2013-10-10 ENCOUNTER — Other Ambulatory Visit (HOSPITAL_BASED_OUTPATIENT_CLINIC_OR_DEPARTMENT_OTHER): Payer: 59

## 2013-10-10 ENCOUNTER — Encounter: Payer: Self-pay | Admitting: Physician Assistant

## 2013-10-10 ENCOUNTER — Telehealth: Payer: Self-pay | Admitting: Physician Assistant

## 2013-10-10 ENCOUNTER — Ambulatory Visit (HOSPITAL_BASED_OUTPATIENT_CLINIC_OR_DEPARTMENT_OTHER): Payer: 59 | Admitting: Physician Assistant

## 2013-10-10 ENCOUNTER — Ambulatory Visit (HOSPITAL_COMMUNITY)
Admission: RE | Admit: 2013-10-10 | Discharge: 2013-10-10 | Disposition: A | Discharge status: Home / Self Care | Payer: 59 | Source: Ambulatory Visit | Attending: Oncology | Admitting: Oncology

## 2013-10-10 ENCOUNTER — Other Ambulatory Visit: Payer: Self-pay | Admitting: Oncology

## 2013-10-10 ENCOUNTER — Ambulatory Visit: Payer: 59

## 2013-10-10 ENCOUNTER — Telehealth: Payer: Self-pay | Admitting: *Deleted

## 2013-10-10 ENCOUNTER — Other Ambulatory Visit: Payer: 59

## 2013-10-10 VITALS — BP 139/92 | HR 87 | Temp 98.1°F | Resp 18 | Ht 67.0 in | Wt 215.4 lb

## 2013-10-10 DIAGNOSIS — F329 Major depressive disorder, single episode, unspecified: Secondary | ICD-10-CM

## 2013-10-10 DIAGNOSIS — C50919 Malignant neoplasm of unspecified site of unspecified female breast: Secondary | ICD-10-CM

## 2013-10-10 DIAGNOSIS — Z5189 Encounter for other specified aftercare: Secondary | ICD-10-CM

## 2013-10-10 DIAGNOSIS — M79622 Pain in left upper arm: Secondary | ICD-10-CM

## 2013-10-10 DIAGNOSIS — C773 Secondary and unspecified malignant neoplasm of axilla and upper limb lymph nodes: Secondary | ICD-10-CM

## 2013-10-10 DIAGNOSIS — M545 Low back pain, unspecified: Secondary | ICD-10-CM | POA: Insufficient documentation

## 2013-10-10 DIAGNOSIS — D701 Agranulocytosis secondary to cancer chemotherapy: Secondary | ICD-10-CM | POA: Insufficient documentation

## 2013-10-10 DIAGNOSIS — F419 Anxiety disorder, unspecified: Secondary | ICD-10-CM

## 2013-10-10 DIAGNOSIS — D649 Anemia, unspecified: Secondary | ICD-10-CM

## 2013-10-10 DIAGNOSIS — F32A Depression, unspecified: Secondary | ICD-10-CM

## 2013-10-10 DIAGNOSIS — T451X5A Adverse effect of antineoplastic and immunosuppressive drugs, initial encounter: Secondary | ICD-10-CM

## 2013-10-10 DIAGNOSIS — R600 Localized edema: Secondary | ICD-10-CM

## 2013-10-10 LAB — CBC WITH DIFFERENTIAL/PLATELET
BASO%: 0.7 % (ref 0.0–2.0)
BASOS ABS: 0 10*3/uL (ref 0.0–0.1)
EOS%: 1.4 % (ref 0.0–7.0)
Eosinophils Absolute: 0 10*3/uL (ref 0.0–0.5)
HCT: 34 % — ABNORMAL LOW (ref 34.8–46.6)
HEMOGLOBIN: 10.6 g/dL — AB (ref 11.6–15.9)
LYMPH#: 1.8 10*3/uL (ref 0.9–3.3)
LYMPH%: 60.8 % — AB (ref 14.0–49.7)
MCH: 28.4 pg (ref 25.1–34.0)
MCHC: 31.2 g/dL — ABNORMAL LOW (ref 31.5–36.0)
MCV: 91.2 fL (ref 79.5–101.0)
MONO#: 0.2 10*3/uL (ref 0.1–0.9)
MONO%: 6.9 % (ref 0.0–14.0)
NEUT#: 0.9 10*3/uL — ABNORMAL LOW (ref 1.5–6.5)
NEUT%: 30.2 % — ABNORMAL LOW (ref 38.4–76.8)
PLATELETS: 207 10*3/uL (ref 145–400)
RBC: 3.73 10*6/uL (ref 3.70–5.45)
RDW: 19.1 % — ABNORMAL HIGH (ref 11.2–14.5)
WBC: 2.9 10*3/uL — ABNORMAL LOW (ref 3.9–10.3)

## 2013-10-10 LAB — COMPREHENSIVE METABOLIC PANEL (CC13)
ALT: 42 U/L (ref 0–55)
ANION GAP: 7 meq/L (ref 3–11)
AST: 30 U/L (ref 5–34)
Albumin: 3.5 g/dL (ref 3.5–5.0)
Alkaline Phosphatase: 74 U/L (ref 40–150)
BILIRUBIN TOTAL: 0.32 mg/dL (ref 0.20–1.20)
BUN: 12.8 mg/dL (ref 7.0–26.0)
CALCIUM: 10 mg/dL (ref 8.4–10.4)
CHLORIDE: 109 meq/L (ref 98–109)
CO2: 25 meq/L (ref 22–29)
CREATININE: 0.8 mg/dL (ref 0.6–1.1)
Glucose: 103 mg/dl (ref 70–140)
Potassium: 3.9 mEq/L (ref 3.5–5.1)
Sodium: 141 mEq/L (ref 136–145)
Total Protein: 6.8 g/dL (ref 6.4–8.3)

## 2013-10-10 MED ORDER — PEGFILGRASTIM INJECTION 6 MG/0.6ML
6.0000 mg | Freq: Once | SUBCUTANEOUS | Status: AC
  Administered 2013-10-10: 6 mg via SUBCUTANEOUS
  Filled 2013-10-10: qty 0.6

## 2013-10-10 NOTE — Telephone Encounter (Signed)
per AB to cancel trmt today-inj today not 6/16-per pof-pt will go to RAD @ [facility] t take XRAY-printed pt sch-pt understood

## 2013-10-10 NOTE — Telephone Encounter (Signed)
per pof to cancel ALL chemo. Pt to see SW. AB req to see her sooner than 7/8-karen from Rad sch stated will call as well as send a staff messsage to AB & CC me-adv p

## 2013-10-10 NOTE — Telephone Encounter (Signed)
Called pt to inform her of Results from X-Ray of Spine. Communicated with pt there was no signs of abnormalities or evidence of CA. Pt was pleased with the results. Pt verbalized understanding. No further concerns. Message to be forwarded to [employer], PA-C.

## 2013-10-10 NOTE — Progress Notes (Signed)
[facility]  [phone] [phone]     ID: [name] OB: 1975/12/16  MR#: [id number]  NLZ#:[id number]  PCP: Colvin Caroli GYN:   SU: Alphonsa Overall OTHER MD: Lonia Chimera [phone])  CHIEF COMPLAINT:  Hx of Left Breast Cancer with Left Axillary Recurrence/Receiving Chemotherapy  (Taxotere/Carbo)   HISTORY OF PRESENT ILLNESS: [name] had bilateral diagnostic mammography [facility] in [place] 11/30/2009 showing a 1.5 cm mass at the 9:00 position of the left breast with some satellites. A biopsy of the mass in question [facility] showed ([id number]) and invasive lobular carcinoma, which was estrogen and progesterone receptor both 100% positive, both with strong staining intensity, but HER-2 negative at 1+. Bilateral breast MRI 12/13/2009 in [place] showed in the left breast an irregularly marginated mass measuring 3.2 cm. There were 4 or 5 separate satellite lesions laterally and superior to the primary.  Accordingly, after appropriate discussion, the patient underwent bilateral mastectomies with left sentinel lymph node sampling 01/02/2010. The right breast was benign. The left breast showed a 2.3 cm invasive lobular carcinoma, grade 3, with a total of 6 sentinel lymph nodes removed, all negative, although 2 showed isolated tumor cells by immunostaining. Margins were negative.  An Oncotype BX was sent, with a recurrence score of 22, predicting a risk of distant recurrence within 10 years with 14% of the patients only systemic treatment was tamoxifen. The patient was treated with CMF chemotherapy between 02/22/2010 and 07/05/2010, receiving 7 cycles at which point the patient refused further chemotherapy though there have been no major toxicities at according to the oncology note from Dr. Brent General. The patient was then started on tamoxifen 08/09/2010. By her cancer took approximately 12-15  months then stopped because of hot flashes and insomnia problems.  More recently, the patient noted a change in her left axilla andunderwent bilateral breast MRI January to 2015 at [facility]. The patient has bilateral submuscular silicone implants in place. The breast were unremarkable, but there were several lymph nodes with cortical thickening in the left axilla including a dominant 1 measuring 1.2 cm in the short axis. Ultrasonography of this area identified the lymph node in question and the patient underwent biopsy of the left axillary lymph node This showed ([id number]) near-total replacement of the lymph node by metastatic carcinoma. This was 96% estrogen receptor positive, and 84% progesterone receptor positive, both with strong staining intensity. The MIB-1 was 20%. HER-2 determination is pending.  The patient's subsequent history is as detailed below  INTERVAL HISTORY: [name] returns alone today for followup of her left breast cancer with left axillary recurrence. She has been receiving docetaxel/carboplatin every 3 weeks, with her fifth cycle held one week ago due to a reduced  ANC of 1.1. She returns today in anticipation of day 1 cycle 5 of 6 planned cycles. She receives Neulasta on day 2 for granulocyte support.  Unfortunately, as detailed below, [name]'s counts are low again this week, actually lower than on 10/03/2013. Her ANC is down from 1.1 last week to 0.9 today.  Fortunately, she denies any fevers or chills, and has had no evidence of illness.  Otherwise, [name] continues to have some swelling in her left upper extremity but has started therapy at the lymphedema clinic. This has helped with decreasing both her swelling in her pain, and her range of motion is beginning to improve as well. She continues to have spasms in her lower back for which she takes [employer]  on an as-needed basis. This pain has not worsened, but does continue. Dr. Marko Plume requested an x-ray of the  lumbosacral spine last week, but [name] has not yet had that done. She denies any weakness or numbness radiating into the lower extremities. In fact, she denies any numbness or tingling in the lower extremities, and has only some tingling in the tips of her fingers when she first wakes up in the morning. No additional signs of peripheral neuropathy have been noted. She continues to have some swelling in her lower extremities, equal on both the right and left, and this improves with elevation.   REVIEW OF SYSTEMS: [name] has had no fevers or chills but  continues to have hot flashes and night sweats. She has moderate fatigue. She has been trying to exercise on a more regular basis, and be more active. Her appetite is good, and she currently denies any problems with nausea, emesis, abdominal pain, or change in bowel or bladder habits. She's keeping herself well hydrated. She denies any increased cough, phlegm production, shortness of breath, chest pain, or palpitations. She's had no abnormal headaches or dizziness and denies any change in vision. She does feel a little anxious today, awaiting her lab results and her treatment plan, but otherwise feels that both anxiety and depression have improved. She continues to have chronic back pain, and pain in the left arm as detailed above. She denies any new or unusual myalgias, arthralgias, or bony pain elsewhere.  A detailed review of systems is otherwise stable and noncontributory.    PAST MEDICAL HISTORY: Past Medical History  Diagnosis Date  . Cancer     breast ca    PAST SURGICAL HISTORY: Past Surgical History  Procedure Laterality Date  . Bilateral total mastectomy with axillary lymph node dissection    . Tonsillectomy    . Leep    . Portacath placement N/A 06/16/2013    Procedure: POWER PORT PLACEMENT;  Surgeon: Shann Medal, MD;  Location: [facility];  Service: General;  Laterality: N/A;  . Axillary lymph node dissection Left 06/16/2013     Procedure: LEFT AXILLARY NODE DISSECTION;  Surgeon: Shann Medal, MD;  Location: [facility];  Service: General;  Laterality: Left;    FAMILY HISTORY Family History  Problem Relation Age of Onset  . Other Mother     glioblastoma  . Other Father     glioblastoma   according to the patient both her parents died from a primary brain cancers, namely we have the stomas, her father at 72, her mother at 52. The patient had one brother and one sister. There is no history of breast or ovarian cancer in the family  GYNECOLOGIC HISTORY:   (Reviewed 10/10/2013) Menarche age 64. The patient has never carried a child to term. She was still having regular periods at the time of her recent diagnosis. LMP 07/18/2013 as of 09/08/2013.  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 She shares a home with her significant other, Burnice Logan, who works at installing cisterns. They have no pets.    ADVANCED DIRECTIVES: Not in place   HEALTH MAINTENANCE:  (Reviewed 10/10/2013) History  Substance Use Topics  . Smoking status: Never Smoker   . Smokeless tobacco: Never Used  . Alcohol Use: No  Colonoscopy: Never  PAP: December 2014  Bone density: Never  Lipid panel: Not on file   Allergies  Allergen Reactions  . Sulfa Antibiotics Swelling  . Gadolinium Derivatives Hives    After gado injection, pt had a few small hives on left breast area. Treated with benadryl by dr Janeece Fitting   . Petrolatum Rash    Current Outpatient Prescriptions  Medication Sig Dispense Refill  . dexamethasone (DECADRON) 4 MG tablet 2 tabs by mouth with food twice daily on day before and 3 days after each chemo  45 tablet  1  . furosemide (LASIX) 20 MG tablet Take 1 tablet (20 mg total) by mouth every other day. As  needed for swelling  10 tablet  0  . metaxalone (SKELAXIN) 800 MG tablet Take 1 tablet (800 mg total) by mouth 3 (three) times daily as needed for muscle spasms.  60 tablet  0  . omeprazole (PRILOSEC) 40 MG capsule Take 1 capsule (40 mg total) by mouth daily.  90 capsule  3  . potassium chloride (MICRO-K) 10 MEQ CR capsule Take 1 capsule (10 mEq total) by mouth 2 (two) times daily.  60 capsule  2  . venlafaxine XR (EFFEXOR-XR) 150 MG 24 hr capsule Take 1 capsule (150 mg total) by mouth daily.  30 capsule  3  . cholestyramine (QUESTRAN) 4 G packet Take 1 packet (4 g total) by mouth 2 (two) times daily as needed. For diarrhea (Take with food)  30 each  0  . dicyclomine (BENTYL) 10 MG capsule Take 1 capsule (10 mg total) by mouth 4 (four) times daily -  before meals and at bedtime.  60 capsule  1  . HYDROcodone-acetaminophen (NORCO/VICODIN) 5-325 MG per tablet Take 1-2 tablets by mouth every 6 (six) hours as needed.  30 tablet  0  . lidocaine-prilocaine (EMLA) cream Apply 1 application topically as needed. 1-2 hours before each port access  30 g  2  . LORazepam (ATIVAN) 0.5 MG tablet Take 1 tablet (0.5 mg total) by mouth at bedtime as needed for anxiety or sleep (or nausea).  30 tablet  0  . ondansetron (ZOFRAN) 8 MG tablet 1 tab by mouth twice daily x 3 days after each chemo, then 1 tab by mouth every 12 hrs if needed for nausea  30 tablet  1  . prochlorperazine (COMPAZINE) 10 MG tablet 1 tab by mouth with meals and bedtime x 3 days after chemo, then 1 tab by mouth every 6 hrs if needed for nausea  30 tablet  2  . prochlorperazine (COMPAZINE) 25 MG suppository Place 1 suppository (25 mg total) rectally every 12 (twelve) hours as needed for nausea or vomiting.  12 suppository  0  . traMADol (ULTRAM) 50 MG tablet Take 1 tablet (50 mg total) by mouth every 6 (six) hours as needed for moderate pain.  60 tablet  0   No current facility-administered medications for this visit.    OBJECTIVE: [name]  American woman who appears  tired but is in no acute distress Filed Vitals:   10/10/13 0937  BP: 139/92  Pulse: 87  Temp: 98.1 F (36.7 C)  Resp: 18     Body mass index is 33.73 kg/(m^2).    ECOG FS:1 - Symptomatic but completely ambulatory Filed Weights   10/10/13 0937  Weight: 215 lb 6.4 oz (97.705 kg)   Physical Exam: HEENT:  Sclerae anicteric.  Oropharynx clear, pink, and moist. No  ulcerations or mucositis. No evidence  of oropharyngeal  candidiasis. Neck supple, trachea midline. NODES:  No cervical or supraclavicular lymphadenopathy palpated.  BREAST EXAM:  Breasts exam deferred. No palpable lymphadenopathy in either the right or left axilla. LUNGS:  Clear to auscultation bilaterally.  No wheezes or rhonchi HEART:  Regular rate and rhythm. No murmur appreciated. ABDOMEN:  Soft, nontender. No organomegaly or masses palpated. Positive, and normoactive  bowel sounds.  MSK:  No focal spinal tenderness to palpation. Limited range of motion in the left upper extremity due to pain, although this has improved since her last exam. EXTREMITIES:  Nonpitting pedal edema bilaterally, equal bilaterally. There is mild nonpitting lymphedema in the left upper extremity. No swelling in the right upper extremity. SKIN:  Skin is hyperpigmented on the hands and nailbeds bilaterally. Otherwise, no skin changes. No excessive ecchymoses. No petechiae. Skin is warm and dry. NEURO:  Nonfocal. Well oriented.  Anxious affect.      LAB RESULTS:   Lab Results  Component Value Date   WBC 2.9* 10/10/2013   NEUTROABS 0.9* 10/10/2013   HGB 10.6* 10/10/2013   HCT 34.0* 10/10/2013   MCV 91.2 10/10/2013   PLT 207 10/10/2013      Chemistry      Component Value Date/Time   NA 141 10/10/2013 0927   NA 135* 07/16/2013 0535   K 3.9 10/10/2013 0927   K 4.4 07/16/2013 0535   CL 97 07/16/2013 0535   CO2 25 10/10/2013 0927   CO2 25 07/16/2013 0535   BUN 12.8 10/10/2013 0927   BUN 11 07/16/2013 0535   CREATININE 0.8  10/10/2013 0927   CREATININE 0.81 07/16/2013 0535      Component Value Date/Time   CALCIUM 10.0 10/10/2013 0927   CALCIUM 10.1 07/16/2013 0535   ALKPHOS 74 10/10/2013 0927   ALKPHOS 92 07/16/2013 0535   AST 30 10/10/2013 0927   AST 16 07/16/2013 0535   ALT 42 10/10/2013 0927   ALT 14 07/16/2013 0535   BILITOT 0.32 10/10/2013 0927   BILITOT 0.9 07/16/2013 0535       STUDIES:  Dg Lumbar Spine 2-3 Views 10/10/2013   CLINICAL DATA:  Metastatic breast cancer  EXAM: LUMBAR SPINE - 2-3 VIEW  COMPARISON:  08/04/2013  FINDINGS: There is no evidence of lumbar spine fracture. Alignment is normal. Intervertebral disc spaces are maintained.  IMPRESSION: Negative.   Electronically Signed   By: Franchot Gallo M.D.   On: 10/10/2013 12:17     ASSESSMENT: 38 y.o. [name] woman  (1) status post bilateral mastectomies with left axillary lymph node sampling [6 nodes removed] 01/02/2010 for a pT2 pN0(i+), stage IIA invasive lobular breast cancer, grade 3, estrogen and progesterone receptor positive, HER-2 not amplified; right breast was benign  (2) Oncotype DX score of 22 predicted a 14% risk of distant recurrence within 10 years if the patient's only systemic treatment is tamoxifen for 5 years  (3) status post CMF(cyclophosphamide, fluorouracil, methotrexate) x7 given between October of 2011 and March of 2012 (7 of 8 planned treatments completed)  (4) tamoxifen started April 2012, discontinued approximately July of 2013 (about 15 months) because of problems with hot flashes and insomnia  (5) pathologically documented left axillary recurrence 05/05/2013, the tumor being again estrogen and progesterone receptor positive, with HER-2 not amplified, and MIB-1 of 15%. Staging CT/PET 05/27/2013 show left axillary and supraclavicular recurrence, no distant disease  (6) status post completion left axillary lymph node dissection 06/16/2013 showing a total of 10/15 lymph nodes involved by tumor, with  evidence of  extracapsular extension (TX N3 = stage IIIC)  (7) being treated with carboplatin/ docetaxel, first dose on 07/11/2013, to be repeated every 21 days x 6 with Neulasta support on day 2.  Final dose was given on 09/12/2013, with chemotherapy discontinued after 4 cycles due to continuing low counts.  (8) radiation to follow chemotherapy  (9) bilateral salpingo-oophorectomy planned for later this year,  hopefully as soon as patient finishes radiation, and followed by aromatase inhibitor therapy for at least 5 years  (10)  depression, being treated with Effexor XR plus counseling - improved   (11) GI symptoms, consistent with IBS - improved  (12)  pitting edema bilaterally in the lower extremities - secondary to Taxotere/dexamethasone- improved  (13) lymphedema, pain, and limited range of motion in the left upper extremity secondary to left axillary lymph node dissection, being treated through the lymphedema clinic  - improved     PLAN:  the majority of our 25 minute appointment today was spent reviewing Latiqua's concerns, reviewing her lab results, discussing her treatment options, and coordinating care. The case was also reviewed with Dr. Jana Hakim today.  We are concerned about the fact that Haylie's counts are not recovering, still with a low ANC, now 4 weeks from treatment which was also followed by a Neulasta injection. At this point, Dr. Jana Hakim is comfortable stopping Sohana's chemotherapy, and proceeding with radiation therapy.   The plan is for her to have a bilateral salpingo-oophorectomy as soon as possible, and hopefully she will be able to have her surgery very soon after she finishes radiation. Following her salpingo-oophorectomy, Dr. Jana Hakim plans on starting [place] on letrozole given with Ibrance, 125 mg daily, 21 days on and 7 days off.   Accordingly, we are requesting an appointment with Dr. Pablo Ledger as soon as possible to initiate radiation therapy, and Carlota is contacting  her gynecologist to discuss scheduling a bilateral salpingo-oophorectomy. She will see Dr. Jana Hakim in approximately 6 weeks for reevaluation, and they will discuss her treatment plan further at that time.  They will also discuss whether or not to have her port removed, and if not, she will be scheduled for routine port flushes, approximately every 6 weeks.  We also gave Fumie a Neulasta injection today to help boost her immune system as soon as possible. We're not starting her on a prophylactic antibiotic at this time, but she knows to contact us with any fevers of 100 or above.  We will plan on checking labs again next week to make sure her counts are well recovered.  All of the above was reviewed in detail with Levada Dy today, and she was also given this information in writing. She voices both her understanding and agreement with this plan, and will contact us with any changes or problems.   Miata Culbreth, PA-C   10/10/2013 12:58 PM

## 2013-10-11 ENCOUNTER — Telehealth: Payer: Self-pay | Admitting: Physician Assistant

## 2013-10-11 ENCOUNTER — Ambulatory Visit: Payer: 59

## 2013-10-11 ENCOUNTER — Encounter: Payer: Self-pay | Admitting: Physician Assistant

## 2013-10-11 ENCOUNTER — Other Ambulatory Visit: Payer: Self-pay | Admitting: Physician Assistant

## 2013-10-11 DIAGNOSIS — D649 Anemia, unspecified: Secondary | ICD-10-CM

## 2013-10-11 DIAGNOSIS — C773 Secondary and unspecified malignant neoplasm of axilla and upper limb lymph nodes: Principal | ICD-10-CM

## 2013-10-11 DIAGNOSIS — C50919 Malignant neoplasm of unspecified site of unspecified female breast: Secondary | ICD-10-CM

## 2013-10-11 NOTE — Telephone Encounter (Signed)
pt cld in re to labs that were to be sch on 6/22-AB sent a pof 6/16@9am  to sch-sch appt-adv pt of time-pt understood

## 2013-10-12 ENCOUNTER — Other Ambulatory Visit: Payer: 59

## 2013-10-12 ENCOUNTER — Ambulatory Visit
Admission: RE | Admit: 2013-10-12 | Discharge: 2013-10-12 | Disposition: A | Discharge status: Home / Self Care | Payer: 59 | Source: Ambulatory Visit | Attending: Radiation Oncology | Admitting: Radiation Oncology

## 2013-10-12 ENCOUNTER — Telehealth: Payer: Self-pay | Admitting: *Deleted

## 2013-10-12 ENCOUNTER — Ambulatory Visit: Payer: 59 | Admitting: Physician Assistant

## 2013-10-12 ENCOUNTER — Ambulatory Visit: Payer: 59 | Admitting: Physical Therapy

## 2013-10-12 ENCOUNTER — Encounter: Payer: Self-pay | Admitting: Radiation Oncology

## 2013-10-12 VITALS — BP 115/86 | HR 98 | Temp 99.1°F | Resp 14 | Wt 216.6 lb

## 2013-10-12 DIAGNOSIS — C50919 Malignant neoplasm of unspecified site of unspecified female breast: Secondary | ICD-10-CM | POA: Insufficient documentation

## 2013-10-12 DIAGNOSIS — Z9010 Acquired absence of unspecified breast and nipple: Secondary | ICD-10-CM | POA: Diagnosis not present

## 2013-10-12 DIAGNOSIS — M549 Dorsalgia, unspecified: Secondary | ICD-10-CM | POA: Diagnosis not present

## 2013-10-12 DIAGNOSIS — C773 Secondary and unspecified malignant neoplasm of axilla and upper limb lymph nodes: Secondary | ICD-10-CM | POA: Diagnosis not present

## 2013-10-12 DIAGNOSIS — Z510 Encounter for antineoplastic radiation therapy: Secondary | ICD-10-CM | POA: Insufficient documentation

## 2013-10-12 DIAGNOSIS — I890 Lymphedema, not elsewhere classified: Secondary | ICD-10-CM | POA: Insufficient documentation

## 2013-10-12 DIAGNOSIS — Z9221 Personal history of antineoplastic chemotherapy: Secondary | ICD-10-CM | POA: Insufficient documentation

## 2013-10-12 DIAGNOSIS — L819 Disorder of pigmentation, unspecified: Secondary | ICD-10-CM | POA: Diagnosis not present

## 2013-10-12 NOTE — Progress Notes (Signed)
Please see the Nurse Progress Note in the MD Initial Consult Encounter for this patient. 

## 2013-10-12 NOTE — Progress Notes (Signed)
Department of Radiation Oncology  Phone:  [phone] Fax:        [phone]   Name: [name] MRN: [id number]  DOB: Sep 07, 1975  Date: 10/12/2013  Follow Up Visit Note  Interval History:  [name] presents today for routine followup.  [name] had a left axillary node dissection on 06/16/2013 which showed metastatic carcinoma in 10/15 lymph nodes. This also revealed multifocal extranodal tumor. [name] went on to receive chemotherapy with which [name] received Taxotere and carboplatin. [name] received 4 cycles and that was discontinued early secondary to neutropenia. He was referred back to me for radiation. [name] main goal at this time is to get to [name] family vacation to [place] which is on September 13. [name] also needs to have a bilateral salpingo-oophorectomy and aromatase inhibitor therapy following radiation. [name] presented unaccompanied today. [name] has been seen by physical therapy and is working on [name] arm motion. [name] states [name] can raise [name] arm above [name] head would like to proceed on with radiation as soon as possible.  Allergies:  Allergies  Allergen Reactions  . Sulfa Antibiotics Swelling  . Gadolinium Derivatives Hives    After gado injection, pt had a few small hives on left breast area. Treated with benadryl by dr Janeece Fitting   . Petrolatum Rash    Medications:  Current Outpatient Prescriptions  Medication Sig Dispense Refill  . cholestyramine (QUESTRAN) 4 G packet Take 1 packet (4 g total) by mouth 2 (two) times daily as needed. For diarrhea (Take with food)  30 each  0  . dexamethasone (DECADRON) 4 MG tablet 2 tabs by mouth with food twice daily on day before and 3 days after each chemo  45 tablet  1  . dicyclomine (BENTYL) 10 MG capsule Take 1 capsule (10 mg total) by mouth 4 (four) times daily -  before meals and at bedtime.  60 capsule  1  . furosemide (LASIX) 20 MG tablet Take 1 tablet (20 mg total) by mouth every other day. As needed for swelling  10 tablet  0  .  HYDROcodone-acetaminophen (NORCO/VICODIN) 5-325 MG per tablet Take 1-2 tablets by mouth every 6 (six) hours as needed.  30 tablet  0  . lidocaine-prilocaine (EMLA) cream Apply 1 application topically as needed. 1-2 hours before each port access  30 g  2  . LORazepam (ATIVAN) 0.5 MG tablet Take 1 tablet (0.5 mg total) by mouth at bedtime as needed for anxiety or sleep (or nausea).  30 tablet  0  . metaxalone (SKELAXIN) 800 MG tablet Take 1 tablet (800 mg total) by mouth 3 (three) times daily as needed for muscle spasms.  60 tablet  0  . omeprazole (PRILOSEC) 40 MG capsule Take 1 capsule (40 mg total) by mouth daily.  90 capsule  3  . ondansetron (ZOFRAN) 8 MG tablet 1 tab by mouth twice daily x 3 days after each chemo, then 1 tab by mouth every 12 hrs if needed for nausea  30 tablet  1  . potassium chloride (MICRO-K) 10 MEQ CR capsule Take 1 capsule (10 mEq total) by mouth 2 (two) times daily.  60 capsule  2  . prochlorperazine (COMPAZINE) 10 MG tablet 1 tab by mouth with meals and bedtime x 3 days after chemo, then 1 tab by mouth every 6 hrs if needed for nausea  30 tablet  2  . prochlorperazine (COMPAZINE) 25 MG suppository Place 1 suppository (25 mg total) rectally every 12 (twelve) hours as needed for nausea or  vomiting.  12 suppository  0  . traMADol (ULTRAM) 50 MG tablet Take 1 tablet (50 mg total) by mouth every 6 (six) hours as needed for moderate pain.  60 tablet  0  . venlafaxine XR (EFFEXOR-XR) 150 MG 24 hr capsule Take 1 capsule (150 mg total) by mouth daily.  30 capsule  3   No current facility-administered medications for this encounter.    Physical Exam:  Filed Vitals:   10/12/13 1113  BP: 115/86  Pulse: 98  Temp: 99.1 F (37.3 C)  TempSrc: Oral  Resp: 14  Weight: 216 lb 9.6 oz (98.249 kg)  SpO2: 100%   pleasant female in no distress sitting comfortably on examining table. Bilateral implants. No palpable or visible evidence of recurrence in the left axilla. [name] can raise [name]  hand above [name] head and place [name] hand behind [name] head.  IMPRESSION: [name] is a 37 y.o. female status post bilateral mastectomies and now salvage left axillary lymph node dissection with 10/15 positive lymph nodes  PLAN:  We discussed again the NCCN guidelines which recommend treatment of the chest wall and regional lymphatics in the recurrent setting. Given that [name] had greater than 50% lymph nodes positive I would also boost the axilla. This may increase [name] risk for lymphedema which I discussed with [name]. We discussed other side effects of radiation including but not limited to skin redness skin darkening and irritation. We discussed this can be mild and temporary or significant and permanent. We discussed the possible use of breath hold technique for cardiac sparing although [name] implant will provide [place] with some offer from radiation to the heart. We discussed asymptomatic rib and lung damage. We discussed the low likelihood of secondary malignancies. We discussed possible capsular contracture and loss of implant. I will schedule [name] for simulation later this week at [name] request and plan on starting [name] next week.    Thea Silversmith, MD

## 2013-10-12 NOTE — Progress Notes (Deleted)
Please see the Nurse Progress Note in the MD Initial Consult Encounter for this patient. 

## 2013-10-12 NOTE — Telephone Encounter (Signed)
Returned  pt's email  to answer question about anti - estrogen therapy. Communicated with pt that she will get started with radiation first and then she will see Dr. Jana Hakim on July 24th to discuss her options for anti estrogen therapy. Reassured pt that together working with the medical team the best plan will be made for her. Pt seemed more at ease and thanked [place] for the callback. Pt verbalized understanding. Message to be forwarded to [employer], PA-C.

## 2013-10-12 NOTE — Progress Notes (Addendum)
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  - MULTIFOCAL EXTRANODAL TUMOR. 05/05/2013 LN left axilla metastatic mammary carcinoma.  12/10/2009    Receptor Status: ER(+), PR (+), Her2-neu (-)  Did patient present with symptoms (if so, please note symptoms) or was this found on screening mammography?:Initial diagnosis made with bilateral diagnostic mammography [facility] in [place] 11/30/2009.Mass at 9 o'clock position in left breast and 9:30 position in right breast.  Past/Anticipated interventions by surgeon, if any:bilateral mastectomy with left axillary lymph node 06/16/2013 by Dr.David Newman  Past/Anticipated interventions by medical oncology, if any:Current  Chemotherapy is carboplatin/docetaxel  First dose on 07/11/2013 to be repeated every 21 days x 6 with neulasta support on day 2. Plan is for bilateral salpingo-oophorectomy after radiation and letrozole given with Ibrancedaily 21 days on and 7 days off.. Previous chemotherapy [facility] x 7  October  on 2011-March No Radiation. Tamoxifen 15 months April 2012 thru July 2013. oncotype score 22  Lymphedema issues, if any:currently attends lymphedema clinic.   Pain issues, if any: left arm  SAFETY ISSUES:  Prior radiation? No  Pacemaker/ICD?No  Possible current pregnancy?Last menstrual period 07/18/2013  Is the patient on methotrexate? 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 Mother and father died from brain tumors (glioblastoma) age 52 and 44 respectively. No history of alcohol or smoking. Allergies:sulfa, gadolinium derivatives and  petrolatum    Malloy, Valantrice Marie, RN 10/12/2013,8:25 AM   

## 2013-10-13 ENCOUNTER — Ambulatory Visit (HOSPITAL_BASED_OUTPATIENT_CLINIC_OR_DEPARTMENT_OTHER)
Admission: RE | Admit: 2013-10-13 | Discharge: 2013-10-13 | Disposition: A | Discharge status: Home / Self Care | Payer: 59 | Source: Ambulatory Visit | Attending: Radiation Oncology | Admitting: Radiation Oncology

## 2013-10-13 DIAGNOSIS — C773 Secondary and unspecified malignant neoplasm of axilla and upper limb lymph nodes: Principal | ICD-10-CM

## 2013-10-13 DIAGNOSIS — Z510 Encounter for antineoplastic radiation therapy: Secondary | ICD-10-CM | POA: Diagnosis not present

## 2013-10-13 DIAGNOSIS — C50919 Malignant neoplasm of unspecified site of unspecified female breast: Secondary | ICD-10-CM

## 2013-10-13 NOTE — Progress Notes (Signed)
Radiation Oncology         [phone]) [phone] ________________________________  Name: [name]      MRN: [id number]          Date: 10/13/2013              DOB: 06-Jun-1975  Optical Surface Tracking Plan:  Since intensity modulated radiotherapy (IMRT) and 3D conformal radiation treatment methods are predicated on accurate and precise positioning for treatment, intrafraction motion monitoring is medically necessary to ensure accurate and safe treatment delivery.  The ability to quantify intrafraction motion without excessive ionizing radiation dose can only be performed with optical surface tracking. Accordingly, surface imaging offers the opportunity to obtain 3D measurements of patient position throughout IMRT and 3D treatments without excessive radiation exposure.  I am ordering optical surface tracking for this patient's upcoming course of radiotherapy. ________________________________ Signature   Reference:   Ursula Alert, J, et al. Surface imaging-based analysis of intrafraction motion for breast radiotherapy patients.Journal of [place], n. 6, nov. 2014. ISSN [id number].   Available at: <http://www.jacmp.org/index.php/jacmp/article/view/4957>.

## 2013-10-13 NOTE — Progress Notes (Signed)
Name: [name]   MRN: [id number]  Date:  10/13/2013  DOB: 29-Aug-1975  Status:outpatient   DIAGNOSIS: Left Breast cancer.  CONSENT VERIFIED: yes SET UP: Patient is setup supine  IMMOBILIZATION:  The following immobilization was used:Custom Moldable Pillow, breast board.  NARRATIVE: [name] was brought to the [place].  Identity was confirmed.  All relevant records and images related to the planned course of therapy were reviewed.  Then, the patient was positioned in a stable reproducible clinical set-up for radiation therapy.  Wires were placed to delineate the clinical extent of breast tissue. A wire was placed on the scar as well.  CT images were obtained.  An isocenter was placed. Skin markings were placed.  The position of the heart was then analyzed.  Due to the proximity of the heart to the chest wall, I felt she would benefit from deep inspiration breath hold for cardiac sparing.  She was then coached and rescanned in the breath hold position.  Acceptable cardiac sparing was achieved. The CT images were loaded into the planning software where the target and avoidance structures were contoured.  The radiation prescription was entered and confirmed. The patient was discharged in stable condition and tolerated simulation well.    TREATMENT PLANNING NOTE/3D Simulation Note Treatment planning then occurred. I have requested : MLC's, isodose plan, basic dose calculation  3D simulation was performed.  I personally supervised and oversaw the construction of 5 medically necessary complex treatment devices in the form of MLCs which will be used for beam modification purposes and to protect critical structures including the heart and lung as well as the immobilization devices which will be used to ensure reproducible set up.  I have requested a dose volume histogram of the heart lung and tumor cavity.    RESPIRATORY MOTION MANAGEMENT SIMULATION - Deep Inspiration Breath  Hold  NARRATIVE:  In order to account for effect of respiratory motion on target structures and other organs in the planning and delivery of radiotherapy, this patient underwent respiratory motion management simulation.  To accomplish this, when the patient was brought to the CT simulation planning suite, a bellows was placed on the her abdomen.  Wave forms of the patient's breathing were obtained. Coaching was performed and practice sessions initiated to monitor her ability to obtain and maintain deep inspiration breath hold.  The CT images were loaded into the planning software and fused with her free breathing images by physics.  Acceptable cardiac sparing was achieved through the use of deep inspiration breath hold.  Planning will be performed on her breath hold scan

## 2013-10-14 ENCOUNTER — Ambulatory Visit: Payer: 59

## 2013-10-17 ENCOUNTER — Other Ambulatory Visit: Payer: 59

## 2013-10-17 ENCOUNTER — Telehealth: Payer: Self-pay | Admitting: *Deleted

## 2013-10-17 DIAGNOSIS — D649 Anemia, unspecified: Secondary | ICD-10-CM

## 2013-10-17 DIAGNOSIS — C50919 Malignant neoplasm of unspecified site of unspecified female breast: Secondary | ICD-10-CM

## 2013-10-17 DIAGNOSIS — C773 Secondary and unspecified malignant neoplasm of axilla and upper limb lymph nodes: Principal | ICD-10-CM

## 2013-10-17 LAB — CBC WITH DIFFERENTIAL/PLATELET
BASO%: 0.3 % (ref 0.0–2.0)
BASOS ABS: 0 10*3/uL (ref 0.0–0.1)
EOS ABS: 0.1 10*3/uL (ref 0.0–0.5)
EOS%: 0.4 % (ref 0.0–7.0)
HCT: 34.2 % — ABNORMAL LOW (ref 34.8–46.6)
HEMOGLOBIN: 10.8 g/dL — AB (ref 11.6–15.9)
LYMPH#: 3 10*3/uL (ref 0.9–3.3)
LYMPH%: 22 % (ref 14.0–49.7)
MCH: 28.9 pg (ref 25.1–34.0)
MCHC: 31.5 g/dL (ref 31.5–36.0)
MCV: 92 fL (ref 79.5–101.0)
MONO#: 1.2 10*3/uL — ABNORMAL HIGH (ref 0.1–0.9)
MONO%: 9 % (ref 0.0–14.0)
NEUT%: 68.3 % (ref 38.4–76.8)
NEUTROS ABS: 9.2 10*3/uL — AB (ref 1.5–6.5)
Platelets: 265 10*3/uL (ref 145–400)
RBC: 3.72 10*6/uL (ref 3.70–5.45)
RDW: 19.1 % — ABNORMAL HIGH (ref 11.2–14.5)
WBC: 13.5 10*3/uL — ABNORMAL HIGH (ref 3.9–10.3)

## 2013-10-17 LAB — COMPREHENSIVE METABOLIC PANEL (CC13)
ALBUMIN: 3.4 g/dL — AB (ref 3.5–5.0)
ALT: 24 U/L (ref 0–55)
AST: 19 U/L (ref 5–34)
Alkaline Phosphatase: 125 U/L (ref 40–150)
Anion Gap: 6 mEq/L (ref 3–11)
BUN: 13.6 mg/dL (ref 7.0–26.0)
CHLORIDE: 109 meq/L (ref 98–109)
CO2: 27 mEq/L (ref 22–29)
Calcium: 10.3 mg/dL (ref 8.4–10.4)
Creatinine: 0.9 mg/dL (ref 0.6–1.1)
GLUCOSE: 115 mg/dL (ref 70–140)
POTASSIUM: 4.1 meq/L (ref 3.5–5.1)
Sodium: 143 mEq/L (ref 136–145)
Total Bilirubin: <0.2 mg/dL (ref 0.20–1.20)
Total Protein: 6.9 g/dL (ref 6.4–8.3)

## 2013-10-17 NOTE — Telephone Encounter (Signed)
Called pt to inform her of lab results. Communicated with pt about specific labs which were all WNL. Pt was pleased with results and verbalized understanding. No further concerns. Message to be forwarded to [employer], PA-C.

## 2013-10-18 ENCOUNTER — Telehealth: Payer: Self-pay | Admitting: *Deleted

## 2013-10-18 ENCOUNTER — Ambulatory Visit: Payer: 59

## 2013-10-18 NOTE — Telephone Encounter (Signed)
Call returned 6/22.

## 2013-10-19 ENCOUNTER — Institutional Professional Consult (permissible substitution): Payer: 59 | Admitting: Radiation Oncology

## 2013-10-19 ENCOUNTER — Ambulatory Visit: Payer: 59

## 2013-10-19 DIAGNOSIS — Z510 Encounter for antineoplastic radiation therapy: Secondary | ICD-10-CM | POA: Diagnosis not present

## 2013-10-20 ENCOUNTER — Ambulatory Visit: Payer: 59 | Admitting: Physical Therapy

## 2013-10-20 ENCOUNTER — Ambulatory Visit
Admission: RE | Admit: 2013-10-20 | Discharge: 2013-10-20 | Disposition: A | Discharge status: Home / Self Care | Payer: 59 | Source: Ambulatory Visit | Attending: Radiation Oncology | Admitting: Radiation Oncology

## 2013-10-20 DIAGNOSIS — Z510 Encounter for antineoplastic radiation therapy: Secondary | ICD-10-CM | POA: Diagnosis not present

## 2013-10-20 DIAGNOSIS — C773 Secondary and unspecified malignant neoplasm of axilla and upper limb lymph nodes: Principal | ICD-10-CM

## 2013-10-20 DIAGNOSIS — C50912 Malignant neoplasm of unspecified site of left female breast: Secondary | ICD-10-CM

## 2013-10-20 NOTE — Progress Notes (Signed)
  Radiation Oncology         [phone]) [phone] ________________________________  Name: [name] MRN: [id number]  Date: 10/20/2013  DOB: [birth year]  Simulation Verification Note  Status: outpatient  NARRATIVE: The patient was brought to the treatment unit and placed in the planned treatment position. The clinical setup was verified. Then port films were obtained and uploaded to the radiation oncology medical record software.  The treatment beams were carefully compared against the planned radiation fields. The position location and shape of the radiation fields was reviewed. The targeted volume of tissue appears appropriately covered by the radiation beams. Organs at risk appear to be excluded as planned.  Based on my personal review, I approved the simulation verification. The patient's treatment will proceed as planned.  ------------------------------------------------  Thea Silversmith, MD

## 2013-10-21 ENCOUNTER — Encounter: Payer: Self-pay | Admitting: *Deleted

## 2013-10-21 NOTE — Progress Notes (Signed)
[place] Psychosocial Distress Screening Clinical Social Work  Clinical Social Work was referred by distress screening protocol.  The patient scored a 7 on the Psychosocial Distress Thermometer which indicates moderate distress. Clinical Social Worker phoned pt to assess for distress and other psychosocial needs. Pt continues to have some financial concerns, but has limited access to assistance as many of her bills are not currently in her name. She has been able to receive gas card support, which has been helpful. She reports her depression seems to be improving somewhat since recent dose change with her Effexor. This change was only made several weeks ago. Pt aware of additional supports through support groups, support from [place] team and other resources at [facility]. Pt agrees to seek out CSWs as needed.  ONCBCN DISTRESS SCREENING 10/12/2013  Screening Type Initial Screening  Elta [place] the number that describes how much distress you have been experiencing in the past week 7  Emotional problem type Depression;Adjusting to illness    Clinical Social Worker follow up needed: Yes.    If yes, follow up plan: Pt agreeable to CSW checking at upcoming radiation appts to reassess needs.   Loren Racer, LCSW Clinical Social Worker [facility] [place] Wednesday, Thursday and Friday Phone: [phone] Fax: [phone]

## 2013-10-24 ENCOUNTER — Ambulatory Visit
Admission: RE | Admit: 2013-10-24 | Discharge: 2013-10-24 | Disposition: A | Discharge status: Home / Self Care | Payer: 59 | Source: Ambulatory Visit | Attending: Radiation Oncology | Admitting: Radiation Oncology

## 2013-10-24 ENCOUNTER — Ambulatory Visit: Payer: 59

## 2013-10-24 ENCOUNTER — Other Ambulatory Visit: Payer: 59

## 2013-10-24 DIAGNOSIS — Z510 Encounter for antineoplastic radiation therapy: Secondary | ICD-10-CM | POA: Diagnosis not present

## 2013-10-25 ENCOUNTER — Encounter: Payer: Self-pay | Admitting: *Deleted

## 2013-10-25 ENCOUNTER — Ambulatory Visit
Admission: RE | Admit: 2013-10-25 | Discharge: 2013-10-25 | Disposition: A | Discharge status: Home / Self Care | Payer: 59 | Source: Ambulatory Visit | Attending: Radiation Oncology | Admitting: Radiation Oncology

## 2013-10-25 ENCOUNTER — Ambulatory Visit: Payer: 59 | Admitting: Radiation Oncology

## 2013-10-25 VITALS — BP 110/80 | HR 79 | Temp 98.0°F | Wt 215.4 lb

## 2013-10-25 DIAGNOSIS — C773 Secondary and unspecified malignant neoplasm of axilla and upper limb lymph nodes: Principal | ICD-10-CM

## 2013-10-25 DIAGNOSIS — C50912 Malignant neoplasm of unspecified site of left female breast: Secondary | ICD-10-CM

## 2013-10-25 DIAGNOSIS — Z510 Encounter for antineoplastic radiation therapy: Secondary | ICD-10-CM | POA: Diagnosis not present

## 2013-10-25 MED ORDER — ALRA NON-METALLIC DEODORANT (RAD-ONC)
1.0000 "application " | Freq: Once | TOPICAL | Status: AC
  Administered 2013-10-25: 1 via TOPICAL

## 2013-10-25 MED ORDER — RADIAPLEXRX EX GEL
Freq: Once | CUTANEOUS | Status: AC
  Administered 2013-10-25: 12:00:00 via TOPICAL

## 2013-10-25 NOTE — Progress Notes (Signed)
Weekly Management Note Current Dose: 3.6  Gy  Projected Dose: 60.4 Gy   Narrative:  The patient presents for routine under treatment assessment.  CBCT/MVCT images/Port film x-rays were reviewed.  The chart was checked. Doing well. No complaints.   Physical Findings: Weight: 215 lb 6.4 oz (97.705 kg). Unchanged  Impression:  The patient is tolerating radiation.  Plan:  Continue treatment as planned. Discussed use of radiaplex as a preventive. Discussed complications regarding her implant.

## 2013-10-25 NOTE — Progress Notes (Signed)
Clinic routine reviewed.Side effects of radiation discussed using teach back method.Given Radiation Therapy and You Booklet.Completed 2 of 25 treatments.continues to have muscle spasms of back.Takes skelaxin for this.

## 2013-10-26 ENCOUNTER — Ambulatory Visit
Admission: RE | Admit: 2013-10-26 | Discharge: 2013-10-26 | Disposition: A | Discharge status: Home / Self Care | Payer: 59 | Source: Ambulatory Visit | Attending: Radiation Oncology | Admitting: Radiation Oncology

## 2013-10-26 ENCOUNTER — Ambulatory Visit: Payer: 59

## 2013-10-26 DIAGNOSIS — Z510 Encounter for antineoplastic radiation therapy: Secondary | ICD-10-CM | POA: Diagnosis not present

## 2013-10-27 ENCOUNTER — Ambulatory Visit
Admission: RE | Admit: 2013-10-27 | Discharge: 2013-10-27 | Disposition: A | Discharge status: Home / Self Care | Payer: 59 | Source: Ambulatory Visit | Attending: Radiation Oncology | Admitting: Radiation Oncology

## 2013-10-27 ENCOUNTER — Encounter: Payer: 59 | Admitting: Physical Therapy

## 2013-10-27 DIAGNOSIS — Z510 Encounter for antineoplastic radiation therapy: Secondary | ICD-10-CM | POA: Diagnosis not present

## 2013-10-31 ENCOUNTER — Ambulatory Visit
Admission: RE | Admit: 2013-10-31 | Discharge: 2013-10-31 | Disposition: A | Discharge status: Home / Self Care | Payer: 59 | Source: Ambulatory Visit | Attending: Radiation Oncology | Admitting: Radiation Oncology

## 2013-10-31 DIAGNOSIS — Z510 Encounter for antineoplastic radiation therapy: Secondary | ICD-10-CM | POA: Diagnosis not present

## 2013-11-01 ENCOUNTER — Ambulatory Visit
Admission: RE | Admit: 2013-11-01 | Discharge: 2013-11-01 | Disposition: A | Discharge status: Home / Self Care | Payer: 59 | Source: Ambulatory Visit | Attending: Radiation Oncology | Admitting: Radiation Oncology

## 2013-11-01 ENCOUNTER — Other Ambulatory Visit: Payer: Self-pay | Admitting: Radiation Oncology

## 2013-11-01 ENCOUNTER — Ambulatory Visit (HOSPITAL_COMMUNITY)
Admission: RE | Admit: 2013-11-01 | Discharge: 2013-11-01 | Disposition: A | Discharge status: Home / Self Care | Payer: 59 | Source: Ambulatory Visit | Attending: Radiation Oncology | Admitting: Radiation Oncology

## 2013-11-01 ENCOUNTER — Encounter: Payer: Self-pay | Admitting: Radiation Oncology

## 2013-11-01 ENCOUNTER — Ambulatory Visit: Payer: 59 | Attending: Physician Assistant | Admitting: Physical Therapy

## 2013-11-01 VITALS — BP 122/83 | HR 84 | Resp 16 | Wt 210.9 lb

## 2013-11-01 DIAGNOSIS — M24519 Contracture, unspecified shoulder: Secondary | ICD-10-CM | POA: Insufficient documentation

## 2013-11-01 DIAGNOSIS — I890 Lymphedema, not elsewhere classified: Secondary | ICD-10-CM | POA: Insufficient documentation

## 2013-11-01 DIAGNOSIS — M7989 Other specified soft tissue disorders: Secondary | ICD-10-CM

## 2013-11-01 DIAGNOSIS — Z8589 Personal history of malignant neoplasm of other organs and systems: Secondary | ICD-10-CM | POA: Insufficient documentation

## 2013-11-01 DIAGNOSIS — C50912 Malignant neoplasm of unspecified site of left female breast: Secondary | ICD-10-CM

## 2013-11-01 DIAGNOSIS — C773 Secondary and unspecified malignant neoplasm of axilla and upper limb lymph nodes: Principal | ICD-10-CM

## 2013-11-01 DIAGNOSIS — Z9221 Personal history of antineoplastic chemotherapy: Secondary | ICD-10-CM | POA: Insufficient documentation

## 2013-11-01 DIAGNOSIS — Z510 Encounter for antineoplastic radiation therapy: Secondary | ICD-10-CM | POA: Diagnosis not present

## 2013-11-01 DIAGNOSIS — IMO0001 Reserved for inherently not codable concepts without codable children: Secondary | ICD-10-CM | POA: Diagnosis present

## 2013-11-01 NOTE — Progress Notes (Signed)
Weekly Management Note Current Dose: 10.8  Gy  Projected Dose: 60.4 Gy   Narrative:  The patient presents for routine under treatment assessment.  CBCT/MVCT images/Port film x-rays were reviewed.  The chart was checked. More left arm/hand lymphedema. Sleeve has been ordered and working with PT> Back pain continues. Not worse or better. Muscle relaxer helps.   Physical Findings: Weight: 210 lb 14.4 oz (95.664 kg). Unchanged skin.   Impression:  The patient is tolerating radiation.  Plan:  Continue treatment as planned. Continue RT.

## 2013-11-01 NOTE — Progress Notes (Signed)
VASCULAR LAB PRELIMINARY  PRELIMINARY  PRELIMINARY  PRELIMINARY  Left upper extremity venous duplex completed.    Preliminary report:  Left:  No evidence of DVT or superficial thrombosis.    Alexsander Cavins, RVT 11/01/2013, 5:26 PM

## 2013-11-01 NOTE — Progress Notes (Signed)
Reports middle back pain that radiates to low back. Reports skelaxin takes the edge off this pain. Reports she has therapy this afternoon for lymphedema in her left arm. She reports her lymphedema sleeve has been ordered. Denies skin changes within treatment field. Reports fatigue but, denies it is any worse than before she started treatment. Weight and vitals stable. Reports using radiaplex as directed.

## 2013-11-02 ENCOUNTER — Ambulatory Visit
Admission: RE | Admit: 2013-11-02 | Discharge: 2013-11-02 | Disposition: A | Discharge status: Home / Self Care | Payer: 59 | Source: Ambulatory Visit | Attending: Radiation Oncology | Admitting: Radiation Oncology

## 2013-11-02 ENCOUNTER — Ambulatory Visit: Payer: 59 | Admitting: Radiation Oncology

## 2013-11-02 ENCOUNTER — Ambulatory Visit: Payer: 59

## 2013-11-02 DIAGNOSIS — Z510 Encounter for antineoplastic radiation therapy: Secondary | ICD-10-CM | POA: Diagnosis not present

## 2013-11-03 ENCOUNTER — Ambulatory Visit: Payer: 59 | Admitting: Physical Therapy

## 2013-11-03 ENCOUNTER — Ambulatory Visit
Admission: RE | Admit: 2013-11-03 | Discharge: 2013-11-03 | Disposition: A | Discharge status: Home / Self Care | Payer: 59 | Source: Ambulatory Visit | Attending: Radiation Oncology | Admitting: Radiation Oncology

## 2013-11-03 DIAGNOSIS — IMO0001 Reserved for inherently not codable concepts without codable children: Secondary | ICD-10-CM | POA: Diagnosis not present

## 2013-11-03 DIAGNOSIS — Z510 Encounter for antineoplastic radiation therapy: Secondary | ICD-10-CM | POA: Diagnosis not present

## 2013-11-04 ENCOUNTER — Ambulatory Visit
Admission: RE | Admit: 2013-11-04 | Discharge: 2013-11-04 | Disposition: A | Discharge status: Home / Self Care | Payer: 59 | Source: Ambulatory Visit | Attending: Radiation Oncology | Admitting: Radiation Oncology

## 2013-11-04 DIAGNOSIS — Z510 Encounter for antineoplastic radiation therapy: Secondary | ICD-10-CM | POA: Diagnosis not present

## 2013-11-07 ENCOUNTER — Ambulatory Visit: Payer: 59

## 2013-11-08 ENCOUNTER — Ambulatory Visit: Payer: 59 | Admitting: Radiation Oncology

## 2013-11-08 ENCOUNTER — Encounter: Payer: Self-pay | Admitting: Radiation Oncology

## 2013-11-08 ENCOUNTER — Ambulatory Visit
Admission: RE | Admit: 2013-11-08 | Discharge: 2013-11-08 | Disposition: A | Discharge status: Home / Self Care | Payer: 59 | Source: Ambulatory Visit | Attending: Radiation Oncology | Admitting: Radiation Oncology

## 2013-11-08 VITALS — BP 112/85 | HR 86 | Resp 16 | Wt 212.0 lb

## 2013-11-08 DIAGNOSIS — C773 Secondary and unspecified malignant neoplasm of axilla and upper limb lymph nodes: Principal | ICD-10-CM

## 2013-11-08 DIAGNOSIS — Z510 Encounter for antineoplastic radiation therapy: Secondary | ICD-10-CM | POA: Diagnosis not present

## 2013-11-08 DIAGNOSIS — C50912 Malignant neoplasm of unspecified site of left female breast: Secondary | ICD-10-CM

## 2013-11-08 NOTE — Progress Notes (Signed)
Reports pain in left upper extremity related to lymphedema. Skin of left/treated breast without hyperpigmentation or desquamation. Reports using radiaplex bid as directed. Denies fatigue.

## 2013-11-08 NOTE — Progress Notes (Signed)
Weekly Management Note Current Dose:  16.2 Gy  Projected Dose: 60.4 Gy   Narrative:  The patient presents for routine under treatment assessment.  CBCT/MVCT images/Port film x-rays were reviewed.  The chart was checked. Arm pain continues. Better after drainage. Awaiting sleeve. No skin changes. Using radiaplex.  Physical Findings: Weight: 212 lb (96.163 kg). No skin changes. Arm appears tight.   Impression:  The patient is tolerating radiation.  Plan:  Continue treatment as planned. Lymphedema therapy per PT. Continue radiaplex.

## 2013-11-09 ENCOUNTER — Ambulatory Visit
Admission: RE | Admit: 2013-11-09 | Discharge: 2013-11-09 | Disposition: A | Discharge status: Home / Self Care | Payer: 59 | Source: Ambulatory Visit | Attending: Radiation Oncology | Admitting: Radiation Oncology

## 2013-11-09 ENCOUNTER — Ambulatory Visit: Payer: 59

## 2013-11-09 DIAGNOSIS — Z510 Encounter for antineoplastic radiation therapy: Secondary | ICD-10-CM | POA: Diagnosis not present

## 2013-11-10 ENCOUNTER — Ambulatory Visit
Admission: RE | Admit: 2013-11-10 | Discharge: 2013-11-10 | Disposition: A | Discharge status: Home / Self Care | Payer: 59 | Source: Ambulatory Visit | Attending: Radiation Oncology | Admitting: Radiation Oncology

## 2013-11-10 DIAGNOSIS — Z510 Encounter for antineoplastic radiation therapy: Secondary | ICD-10-CM | POA: Diagnosis not present

## 2013-11-11 ENCOUNTER — Ambulatory Visit
Admission: RE | Admit: 2013-11-11 | Discharge: 2013-11-11 | Disposition: A | Discharge status: Home / Self Care | Payer: 59 | Source: Ambulatory Visit | Attending: Radiation Oncology | Admitting: Radiation Oncology

## 2013-11-11 DIAGNOSIS — Z510 Encounter for antineoplastic radiation therapy: Secondary | ICD-10-CM | POA: Diagnosis not present

## 2013-11-14 ENCOUNTER — Ambulatory Visit
Admission: RE | Admit: 2013-11-14 | Discharge: 2013-11-14 | Disposition: A | Discharge status: Home / Self Care | Payer: 59 | Source: Ambulatory Visit | Attending: Radiation Oncology | Admitting: Radiation Oncology

## 2013-11-14 DIAGNOSIS — Z510 Encounter for antineoplastic radiation therapy: Secondary | ICD-10-CM | POA: Diagnosis not present

## 2013-11-15 ENCOUNTER — Ambulatory Visit
Admission: RE | Admit: 2013-11-15 | Discharge: 2013-11-15 | Disposition: A | Discharge status: Home / Self Care | Payer: 59 | Source: Ambulatory Visit | Attending: Radiation Oncology | Admitting: Radiation Oncology

## 2013-11-15 ENCOUNTER — Ambulatory Visit: Payer: 59 | Admitting: Physical Therapy

## 2013-11-15 ENCOUNTER — Ambulatory Visit: Payer: 59

## 2013-11-15 VITALS — BP 119/80 | HR 77 | Temp 98.0°F | Wt 213.4 lb

## 2013-11-15 DIAGNOSIS — C773 Secondary and unspecified malignant neoplasm of axilla and upper limb lymph nodes: Principal | ICD-10-CM

## 2013-11-15 DIAGNOSIS — Z510 Encounter for antineoplastic radiation therapy: Secondary | ICD-10-CM | POA: Diagnosis not present

## 2013-11-15 DIAGNOSIS — C50912 Malignant neoplasm of unspecified site of left female breast: Secondary | ICD-10-CM

## 2013-11-15 DIAGNOSIS — IMO0001 Reserved for inherently not codable concepts without codable children: Secondary | ICD-10-CM | POA: Diagnosis not present

## 2013-11-15 MED ORDER — RADIAPLEXRX EX GEL
Freq: Once | CUTANEOUS | Status: AC
  Administered 2013-11-15: 11:00:00 via TOPICAL

## 2013-11-15 NOTE — Addendum Note (Signed)
Encounter addended by: Arion Shankles Marie Mavis Fichera, RN on: 11/15/2013 10:49 AM<BR>     Documentation filed: Inpatient MAR

## 2013-11-15 NOTE — Progress Notes (Signed)
Weekly Management Note Current Dose:  27 Gy  Projected Dose: 60.4 Gy   Narrative:  The patient presents for routine under treatment assessment.  CBCT/MVCT images/Port film x-rays were reviewed.  The chart was checked. Doing well. Back pain continues (she would still like to defer work up at this time). No sleeve yet but she is seeing PT.   Physical Findings: Weight: 213 lb 6.4 oz (96.798 kg). Minimal skin darkening.   Impression:  The patient is tolerating radiation.  Plan:  Continue treatment as planned. Continue radiaplex.

## 2013-11-16 ENCOUNTER — Ambulatory Visit
Admission: RE | Admit: 2013-11-16 | Discharge: 2013-11-16 | Disposition: A | Discharge status: Home / Self Care | Payer: 59 | Source: Ambulatory Visit | Attending: Radiation Oncology | Admitting: Radiation Oncology

## 2013-11-16 DIAGNOSIS — Z510 Encounter for antineoplastic radiation therapy: Secondary | ICD-10-CM | POA: Diagnosis not present

## 2013-11-17 ENCOUNTER — Ambulatory Visit: Payer: 59 | Admitting: Physical Therapy

## 2013-11-17 ENCOUNTER — Ambulatory Visit
Admission: RE | Admit: 2013-11-17 | Discharge: 2013-11-17 | Disposition: A | Discharge status: Home / Self Care | Payer: 59 | Source: Ambulatory Visit | Attending: Radiation Oncology | Admitting: Radiation Oncology

## 2013-11-17 DIAGNOSIS — IMO0001 Reserved for inherently not codable concepts without codable children: Secondary | ICD-10-CM | POA: Diagnosis not present

## 2013-11-17 DIAGNOSIS — Z510 Encounter for antineoplastic radiation therapy: Secondary | ICD-10-CM | POA: Diagnosis not present

## 2013-11-18 ENCOUNTER — Ambulatory Visit (HOSPITAL_BASED_OUTPATIENT_CLINIC_OR_DEPARTMENT_OTHER): Payer: 59 | Admitting: Oncology

## 2013-11-18 ENCOUNTER — Encounter: Payer: Self-pay | Admitting: *Deleted

## 2013-11-18 ENCOUNTER — Other Ambulatory Visit (HOSPITAL_BASED_OUTPATIENT_CLINIC_OR_DEPARTMENT_OTHER): Payer: 59

## 2013-11-18 ENCOUNTER — Telehealth: Payer: Self-pay | Admitting: Oncology

## 2013-11-18 ENCOUNTER — Ambulatory Visit
Admission: RE | Admit: 2013-11-18 | Discharge: 2013-11-18 | Disposition: A | Discharge status: Home / Self Care | Payer: 59 | Source: Ambulatory Visit | Attending: Radiation Oncology | Admitting: Radiation Oncology

## 2013-11-18 VITALS — BP 131/81 | HR 101 | Temp 98.7°F | Resp 20 | Ht 67.0 in | Wt 209.4 lb

## 2013-11-18 DIAGNOSIS — C50919 Malignant neoplasm of unspecified site of unspecified female breast: Secondary | ICD-10-CM

## 2013-11-18 DIAGNOSIS — C773 Secondary and unspecified malignant neoplasm of axilla and upper limb lymph nodes: Secondary | ICD-10-CM

## 2013-11-18 DIAGNOSIS — F3289 Other specified depressive episodes: Secondary | ICD-10-CM

## 2013-11-18 DIAGNOSIS — R5381 Other malaise: Secondary | ICD-10-CM

## 2013-11-18 DIAGNOSIS — F329 Major depressive disorder, single episode, unspecified: Secondary | ICD-10-CM

## 2013-11-18 DIAGNOSIS — Z170 Estrogen receptor positive status [ER+]: Secondary | ICD-10-CM

## 2013-11-18 DIAGNOSIS — R5383 Other fatigue: Secondary | ICD-10-CM

## 2013-11-18 DIAGNOSIS — D649 Anemia, unspecified: Secondary | ICD-10-CM

## 2013-11-18 DIAGNOSIS — I890 Lymphedema, not elsewhere classified: Secondary | ICD-10-CM

## 2013-11-18 DIAGNOSIS — C50912 Malignant neoplasm of unspecified site of left female breast: Secondary | ICD-10-CM

## 2013-11-18 DIAGNOSIS — Z510 Encounter for antineoplastic radiation therapy: Secondary | ICD-10-CM | POA: Diagnosis not present

## 2013-11-18 LAB — COMPREHENSIVE METABOLIC PANEL (CC13)
ALT: 14 U/L (ref 0–55)
ANION GAP: 7 meq/L (ref 3–11)
AST: 20 U/L (ref 5–34)
Albumin: 3.9 g/dL (ref 3.5–5.0)
Alkaline Phosphatase: 82 U/L (ref 40–150)
BUN: 10.9 mg/dL (ref 7.0–26.0)
CALCIUM: 10.8 mg/dL — AB (ref 8.4–10.4)
CHLORIDE: 106 meq/L (ref 98–109)
CO2: 28 meq/L (ref 22–29)
CREATININE: 0.8 mg/dL (ref 0.6–1.1)
Glucose: 105 mg/dl (ref 70–140)
Potassium: 4.1 mEq/L (ref 3.5–5.1)
Sodium: 141 mEq/L (ref 136–145)
Total Bilirubin: 0.35 mg/dL (ref 0.20–1.20)
Total Protein: 7.9 g/dL (ref 6.4–8.3)

## 2013-11-18 LAB — CBC WITH DIFFERENTIAL/PLATELET
BASO%: 0.8 % (ref 0.0–2.0)
BASOS ABS: 0 10*3/uL (ref 0.0–0.1)
EOS%: 2.4 % (ref 0.0–7.0)
Eosinophils Absolute: 0.1 10*3/uL (ref 0.0–0.5)
HEMATOCRIT: 38.6 % (ref 34.8–46.6)
HEMOGLOBIN: 12.3 g/dL (ref 11.6–15.9)
LYMPH#: 1.1 10*3/uL (ref 0.9–3.3)
LYMPH%: 31.8 % (ref 14.0–49.7)
MCH: 28.9 pg (ref 25.1–34.0)
MCHC: 31.8 g/dL (ref 31.5–36.0)
MCV: 90.7 fL (ref 79.5–101.0)
MONO#: 0.3 10*3/uL (ref 0.1–0.9)
MONO%: 7.7 % (ref 0.0–14.0)
NEUT#: 1.9 10*3/uL (ref 1.5–6.5)
NEUT%: 57.3 % (ref 38.4–76.8)
PLATELETS: 224 10*3/uL (ref 145–400)
RBC: 4.26 10*6/uL (ref 3.70–5.45)
RDW: 15 % — ABNORMAL HIGH (ref 11.2–14.5)
WBC: 3.3 10*3/uL — ABNORMAL LOW (ref 3.9–10.3)

## 2013-11-18 NOTE — Progress Notes (Unsigned)
Pt was seen today by Dr. Jana Hakim. Faxed a copy of OV notes to Dr. Kendall Flack.

## 2013-11-18 NOTE — Telephone Encounter (Signed)
per pof to sch pt appt-sch and gave pt copy-sch appt w/Dr Lamont Dowdy pt time & date 7/29@2 -pt understod

## 2013-11-18 NOTE — Progress Notes (Signed)
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 A biopsy of the mass in question [place] [facility] showed ([id number]) and invasive lobular carcinoma, which was estrogen and progesterone receptor both 100% positive, both with strong staining intensity, but HER-2 negative at 1+. Bilateral breast MRI 12/13/2009 in [place] showed in the left breast an irregularly marginated mass measuring 3.2 cm. There were 4 or 5 separate satellite lesions laterally and superior to the primary.  Accordingly, after appropriate discussion, the patient underwent bilateral mastectomies with left sentinel lymph node sampling 01/02/2010. The right breast was benign. The left breast showed a 2.3 cm invasive lobular carcinoma, grade 3, with a total of 6 sentinel lymph nodes removed, all negative, although 2 showed isolated tumor cells by immunostaining. Margins were negative.  An Oncotype BX was sent, with a recurrence score of 22, predicting a risk of distant recurrence within 10 years with 14% of the patients only systemic treatment was tamoxifen. The patient was treated with CMF chemotherapy between 02/22/2010 and 07/05/2010, receiving 7 cycles at which point the patient refused further chemotherapy though there have been no major toxicities at according to the oncology note from Dr. Brent General. The patient was then started on tamoxifen  08/09/2010. By her cancer took approximately 12-15 months then stopped because of hot flashes and insomnia problems.  More recently, the patient noted a change in her left axilla andunderwent bilateral breast MRI January to 2015 at [facility]. The patient has bilateral submuscular silicone implants in place. The breast were unremarkable, but there were several lymph nodes with cortical thickening in the left axilla including a dominant 1 measuring 1.2 cm in the short axis. Ultrasonography of this area identified the lymph node in question and the patient underwent biopsy of the left axillary lymph node This showed ([id number]) near-total replacement of the lymph node by metastatic carcinoma. This was 96% estrogen receptor positive, and 84% progesterone receptor positive, both with strong staining intensity. The MIB-1 was 20%. HER-2 determination is pending.  The patient's subsequent history is as detailed below  INTERVAL HISTORY: [name] returns today for followup of her breast cancer.since her last visit here she completed her chemotherapy and started radiation. She is tolerating this well so far. She does complain of some fatigue and early skin changes   REVIEW OF SYSTEMS: [name] has left upper extremity lymphedema, which has not resolved. She is very concerned that this is going to make it very difficult for her to return to work, since her job is very demanding. She also has back pain, is undergoing counseling for depression, and occasionally has cramps. She has had no menstrual period since March of 2015. A detailed review of systems is otherwise stable  PAST MEDICAL HISTORY: Past Medical History  Diagnosis Date  . Cancer     breast ca    PAST SURGICAL HISTORY: Past Surgical History  Procedure Laterality Date  . Bilateral total mastectomy with axillary lymph node dissection    . Tonsillectomy    .  Leep    . Portacath placement N/A 06/16/2013    Procedure: POWER PORT PLACEMENT;   Surgeon: Shann Medal, MD;  Location: [facility];  Service: General;  Laterality: N/A;  . Axillary lymph node dissection Left 06/16/2013    Procedure: LEFT AXILLARY NODE DISSECTION;  Surgeon: Shann Medal, MD;  Location: [facility];  Service: General;  Laterality: Left;    FAMILY HISTORY Family History  Problem Relation Age of Onset  . Other Mother     glioblastoma  . Other Father     glioblastoma   according to the patient both her parents died from a primary brain cancers, namely we have the stomas, her father at 40, her mother at 40. The patient had one brother and one sister. There is no history of breast or ovarian cancer in the family  GYNECOLOGIC HISTORY:   (Reviewed 10/10/2013) Menarche age 38. The patient has never carried a child to term. She was still having regular periods at the time of her recent diagnosis. LMP 07/18/2013 as of 09/08/2013.  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 She shares a home with her significant other, [name], who works at installing cisterns. They have no pets.    ADVANCED DIRECTIVES: Not in place   HEALTH MAINTENANCE:  (Reviewed 10/10/2013) History  Substance Use Topics  . Smoking status: Never Smoker   . Smokeless tobacco: Never Used  . Alcohol Use: No     Colonoscopy: Never  PAP: December 2014  Bone density: Never  Lipid panel: Not on file   Allergies  Allergen Reactions  . Sulfa Antibiotics Swelling  . Gadolinium Derivatives Hives    After gado injection, pt had a few small hives on left breast area. Treated with benadryl by dr Janeece Fitting   . Petrolatum Rash    Current Outpatient Prescriptions  Medication Sig Dispense Refill  . dexamethasone (DECADRON) 4 MG tablet 2 tabs by mouth with food twice daily on day  before and 3 days after each chemo  45 tablet  1  . dicyclomine (BENTYL) 10 MG capsule Take 1 capsule (10 mg total) by mouth 4 (four) times daily -  before meals and at bedtime.  60 capsule  1  . furosemide (LASIX) 20 MG tablet Take 1 tablet (20 mg total) by mouth every other day. As needed for swelling  10 tablet  0  . HYDROcodone-acetaminophen (NORCO/VICODIN) 5-325 MG per tablet Take 1-2 tablets by mouth every 6 (six) hours as needed.  30 tablet  0  . lidocaine-prilocaine (EMLA) cream Apply 1 application topically as needed. 1-2 hours before each port access  30 g  2  . LORazepam (ATIVAN) 0.5 MG tablet Take 1 tablet (0.5 mg total) by mouth at bedtime as needed for anxiety or sleep (or nausea).  30 tablet  0  . metaxalone (SKELAXIN) 800 MG tablet Take 1 tablet (800 mg total) by mouth 3 (three) times daily as needed for muscle spasms.  60 tablet  0  . omeprazole (PRILOSEC) 40 MG capsule Take 1 capsule (40 mg total) by mouth daily.  90 capsule  3  . ondansetron (ZOFRAN) 8 MG tablet 1 tab by mouth twice daily x 3 days after each chemo, then 1 tab by mouth every 12 hrs if needed for  nausea  30 tablet  1  . potassium chloride (MICRO-K) 10 MEQ CR capsule Take 1 capsule (10 mEq total) by mouth 2 (two) times daily.  60 capsule  2  . prochlorperazine (COMPAZINE) 10 MG tablet 1 tab by mouth with meals and bedtime x 3 days after chemo, then 1 tab by mouth every 6 hrs if needed for nausea  30 tablet  2  . prochlorperazine (COMPAZINE) 25 MG suppository Place 1 suppository (25 mg total) rectally every 12 (twelve) hours as needed for nausea or vomiting.  12 suppository  0  . venlafaxine XR (EFFEXOR-XR) 150 MG 24 hr capsule Take 1 capsule (150 mg total) by mouth daily.  30 capsule  3   No current facility-administered medications for this visit.    OBJECTIVE: Young [place] [employer] Bell who appears  tired but is in no acute distress Filed Vitals:   11/18/13 1046  BP: 131/81  Pulse: 101  Temp: 98.7 F (37.1  C)  Resp: 20     Body mass index is 32.79 kg/(m^2).    ECOG FS:1 - Symptomatic but completely ambulatory Filed Weights   11/18/13 1046  Weight: 209 lb 6.4 oz (94.983 kg)   Sclerae unicteric, pupils equal and reactive Oropharynx clear and moist No cervical or supraclavicular adenopathy Lungs no rales or rhonchi Heart regular rate and rhythm Abd soft, nontender, positive bowel sounds MSK no focal spinal tenderness, grade 2 left upper extremity lymphedema Neuro: nonfocal, well oriented, appropriate affect Breasts: Status post bilateral mastectomies. There is no evidence of local recurrence. Both axillae are benign.  LAB RESULTS:   Lab Results  Component Value Date   WBC 3.3* 11/18/2013   NEUTROABS 1.9 11/18/2013   HGB 12.3 11/18/2013   HCT 38.6 11/18/2013   MCV 90.7 11/18/2013   PLT 224 11/18/2013      Chemistry      Component Value Date/Time   NA 141 11/18/2013 1021   NA 135* 07/16/2013 0535   K 4.1 11/18/2013 1021   K 4.4 07/16/2013 0535   CL 97 07/16/2013 0535   CO2 28 11/18/2013 1021   CO2 25 07/16/2013 0535   BUN 10.9 11/18/2013 1021   BUN 11 07/16/2013 0535   CREATININE 0.8 11/18/2013 1021   CREATININE 0.81 07/16/2013 0535      Component Value Date/Time   CALCIUM 10.8* 11/18/2013 1021   CALCIUM 10.1 07/16/2013 0535   ALKPHOS 82 11/18/2013 1021   ALKPHOS 92 07/16/2013 0535   AST 20 11/18/2013 1021   AST 16 07/16/2013 0535   ALT 14 11/18/2013 1021   ALT 14 07/16/2013 0535   BILITOT 0.35 11/18/2013 1021   BILITOT 0.9 07/16/2013 0535       STUDIES: No results found.  ASSESSMENT: 38 y.o. [name]  (1) status post bilateral mastectomies with left axillary lymph node sampling [6 nodes removed] 01/02/2010 for a pT2 pN0(i+), stage IIA invasive lobular breast cancer, grade 3, estrogen and progesterone receptor positive, HER-2 not amplified; right breast was benign  (2) Oncotype DX score of 22 predicted a 14% risk of distant recurrence within 10 years if the patient's only  systemic treatment is tamoxifen for 5 years  (3) status post CMF(cyclophosphamide, fluorouracil, methotrexate) x7 given between October of 2011 and March of 2012 (7 of 8 planned treatments completed)  (4) tamoxifen started April 2012, discontinued approximately July of 2013 (about 15 months) because of problems with hot flashes and insomnia  (5) pathologically documented left axillary recurrence 05/05/2013, the tumor  being again estrogen and progesterone receptor positive, with HER-2 not amplified, and MIB-1 of 15%. Staging CT/PET 05/27/2013 show left axillary and supraclavicular recurrence, no distant disease  (6) status post completion left axillary lymph node dissection 06/16/2013 showing a total of 10/15 lymph nodes involved by tumor, with evidence of extracapsular extension (TX N3 = stage IIIC)  (7) treated with carboplatin/ docetaxel, first dose on 07/11/2013, repeated every 21 days x 4 with Neulasta support on day 2.  Final dose was given on 09/12/2013, with chemotherapy discontinued after 4 cycles due to continuing low counts.  (8) radiation to be completed 12/09/2013  (9) bilateral salpingo-oophorectomy planned for later this year,  hopefully as soon as patient finishes radiation, and followed by aromatase inhibitor therapy for at least 5 years  (10)  depression, being treated with Effexor XR plus counseling - improved   (11) GI symptoms, consistent with IBS - improved  (12)  pitting edema bilaterally in the lower extremities - secondary to Taxotere/dexamethasone- improved  (13) lymphedema, pain, and limited range of motion in the left upper extremity secondary to left axillary lymph node dissection, being treated through the lymphedema clinic  - improved     PLAN: Delorice is tolerating the radiation well so far, but of course it will continue through mid August, and the last week or 2 are the worst usually. In my experience it takes 2-4 months to recover from this. In that  interval hopefully she will be able to undergo bilateral salpingo-oophorectomy, and we will start her on anastrozole after that.  I do not find that she has ever met with genetics and she does not recall ever doing so. Accordingly we are placing that referral at present.  She is very concerned that her job is very demanding and malaise she gets full use of her right arm she may not be able to return there. She is continuing to rehabilitation that. In the meantime of course there is no question of are going back to work until most likely early next year.  She has a good understanding of the overall plan. She agrees with it. She knows the goal of treatment in her case is cure. She will call with any problems that may develop before her next visit here.  Chauncey Cruel, MD   11/18/2013 11:49 AM

## 2013-11-21 ENCOUNTER — Encounter: Payer: 59 | Admitting: Physical Therapy

## 2013-11-21 ENCOUNTER — Ambulatory Visit
Admission: RE | Admit: 2013-11-21 | Discharge: 2013-11-21 | Disposition: A | Discharge status: Home / Self Care | Payer: 59 | Source: Ambulatory Visit | Attending: Radiation Oncology | Admitting: Radiation Oncology

## 2013-11-21 DIAGNOSIS — Z510 Encounter for antineoplastic radiation therapy: Secondary | ICD-10-CM | POA: Diagnosis not present

## 2013-11-22 ENCOUNTER — Ambulatory Visit: Payer: 59 | Admitting: Radiation Oncology

## 2013-11-22 ENCOUNTER — Ambulatory Visit
Admission: RE | Admit: 2013-11-22 | Discharge: 2013-11-22 | Disposition: A | Discharge status: Home / Self Care | Payer: 59 | Source: Ambulatory Visit | Attending: Radiation Oncology | Admitting: Radiation Oncology

## 2013-11-22 ENCOUNTER — Telehealth: Payer: Self-pay | Admitting: *Deleted

## 2013-11-22 VITALS — BP 118/79 | HR 83 | Temp 97.8°F | Wt 210.4 lb

## 2013-11-22 DIAGNOSIS — Z510 Encounter for antineoplastic radiation therapy: Secondary | ICD-10-CM | POA: Diagnosis not present

## 2013-11-22 DIAGNOSIS — C773 Secondary and unspecified malignant neoplasm of axilla and upper limb lymph nodes: Principal | ICD-10-CM

## 2013-11-22 DIAGNOSIS — C50912 Malignant neoplasm of unspecified site of left female breast: Secondary | ICD-10-CM

## 2013-11-22 NOTE — Progress Notes (Signed)
Weekly assessment of radiation to left breast.Completed 20 treatments.Mild tanning of skin.Continue application of radiaplex.Continued left posterior to mid back pain since surgery relieved with skelaxin.

## 2013-11-22 NOTE — Progress Notes (Signed)
  Radiation Oncology         [phone] ________________________________  Name: [name] MRN: [id number]  Date: 11/22/2013  DOB: Mar 19, 1976  Weekly Radiation Therapy Management  Current Dose: 36 Gy     Planned Dose:  60.4 Gy  Narrative . . . . . . . . The patient presents for routine under treatment assessment.                                   The patient is without complaint.                                 Set-up films were reviewed.                                 The chart was checked. Physical Findings. . .  weight is 210 lb 6.4 oz (95.437 kg). Her temperature is 97.8 F (36.6 C). Her blood pressure is 118/79 and her pulse is 83. . Weight essentially stable.  Hyperpigmentation changes are noted in the left chest region. The lungs are clear. The heart has regular rhythm and rate Impression . . . . . . . The patient is tolerating radiation. Plan . . . . . . . . . . . . Continue treatment as planned.  ________________________________   Blair Promise, PhD, MD

## 2013-11-22 NOTE — Telephone Encounter (Signed)
Received message from Dr. Jana Hakim to schedule a genetic appt.  Called and left a message for the pt to return my call so I can schedule her.  Emailed Dr. Jana Hakim to make him aware.

## 2013-11-22 NOTE — Telephone Encounter (Signed)
Pt returned my call and I confirmed 12/01/13 genetic appt w/ pt.  Gave her check in instructions for when she gets finished with Radiation.

## 2013-11-22 NOTE — Progress Notes (Deleted)
  Radiation Oncology         [phone] ________________________________  Name: [name] MRN: [id number]  Date: 11/22/2013  DOB: [birth year]  Weekly Radiation Therapy Management  Current Dose: 18 Gy     Planned Dose:  60.4 Gy  Narrative . . . . . . . . The patient presents for routine under treatment assessment.                                   The patient is without complaint.                                 Set-up films were reviewed.                                 The chart was checked. Physical Findings. . .  weight is 210 lb 6.4 oz (95.437 kg). Her temperature is 97.8 F (36.6 C). Her blood pressure is 118/79 and her pulse is 83. Marland Kitchen Hyperpigmentation changes are noted in the left chest region. The lungs are clear. The heart has a regular rhythm and rate. Impression . . . . . . . The patient is tolerating radiation. Plan . . . . . . . . . . . . Continue treatment as planned.  ________________________________   Blair Promise, PhD, MD

## 2013-11-23 ENCOUNTER — Ambulatory Visit
Admission: RE | Admit: 2013-11-23 | Discharge: 2013-11-23 | Disposition: A | Discharge status: Home / Self Care | Payer: 59 | Source: Ambulatory Visit | Attending: Radiation Oncology | Admitting: Radiation Oncology

## 2013-11-23 DIAGNOSIS — Z510 Encounter for antineoplastic radiation therapy: Secondary | ICD-10-CM | POA: Diagnosis not present

## 2013-11-24 ENCOUNTER — Ambulatory Visit
Admission: RE | Admit: 2013-11-24 | Discharge: 2013-11-24 | Disposition: A | Discharge status: Home / Self Care | Payer: 59 | Source: Ambulatory Visit | Attending: Radiation Oncology | Admitting: Radiation Oncology

## 2013-11-24 DIAGNOSIS — Z510 Encounter for antineoplastic radiation therapy: Secondary | ICD-10-CM | POA: Diagnosis not present

## 2013-11-25 ENCOUNTER — Ambulatory Visit
Admission: RE | Admit: 2013-11-25 | Discharge: 2013-11-25 | Disposition: A | Discharge status: Home / Self Care | Payer: 59 | Source: Ambulatory Visit | Attending: Radiation Oncology | Admitting: Radiation Oncology

## 2013-11-25 DIAGNOSIS — Z510 Encounter for antineoplastic radiation therapy: Secondary | ICD-10-CM | POA: Diagnosis not present

## 2013-11-28 ENCOUNTER — Other Ambulatory Visit: Payer: Self-pay | Admitting: Obstetrics and Gynecology

## 2013-11-28 ENCOUNTER — Ambulatory Visit
Admission: RE | Admit: 2013-11-28 | Discharge: 2013-11-28 | Disposition: A | Discharge status: Home / Self Care | Payer: 59 | Source: Ambulatory Visit | Attending: Radiation Oncology | Admitting: Radiation Oncology

## 2013-11-28 DIAGNOSIS — Z510 Encounter for antineoplastic radiation therapy: Secondary | ICD-10-CM | POA: Diagnosis not present

## 2013-11-29 ENCOUNTER — Ambulatory Visit
Admission: RE | Admit: 2013-11-29 | Discharge: 2013-11-29 | Disposition: A | Discharge status: Home / Self Care | Payer: 59 | Source: Ambulatory Visit | Attending: Radiation Oncology | Admitting: Radiation Oncology

## 2013-11-29 ENCOUNTER — Ambulatory Visit: Payer: 59 | Attending: Physician Assistant | Admitting: Physical Therapy

## 2013-11-29 DIAGNOSIS — C50919 Malignant neoplasm of unspecified site of unspecified female breast: Secondary | ICD-10-CM

## 2013-11-29 DIAGNOSIS — Z510 Encounter for antineoplastic radiation therapy: Secondary | ICD-10-CM | POA: Diagnosis not present

## 2013-11-29 DIAGNOSIS — IMO0001 Reserved for inherently not codable concepts without codable children: Secondary | ICD-10-CM | POA: Insufficient documentation

## 2013-11-29 DIAGNOSIS — M24519 Contracture, unspecified shoulder: Secondary | ICD-10-CM | POA: Insufficient documentation

## 2013-11-29 DIAGNOSIS — C773 Secondary and unspecified malignant neoplasm of axilla and upper limb lymph nodes: Principal | ICD-10-CM

## 2013-11-29 DIAGNOSIS — I890 Lymphedema, not elsewhere classified: Secondary | ICD-10-CM | POA: Insufficient documentation

## 2013-11-29 MED ORDER — BIAFINE EX EMUL
Freq: Two times a day (BID) | CUTANEOUS | Status: DC
  Administered 2013-11-29: 10:00:00 via TOPICAL

## 2013-11-29 NOTE — Progress Notes (Addendum)
MD saw patient on treatment machine, asked to give patient biafine cream to use now and hydrogel pads, with instructions of use of hydrogel pads ,keep extras in refrigerator only, gave 2 mesh tube tops also ,patient to use gel pads prn,  10:01 AM  9:48 AM

## 2013-11-29 NOTE — Progress Notes (Signed)
Weekly Management Note Current Dose:  45 Gy  Projected Dose: 60.4 Gy   Narrative:  The patient presents for routine under treatment assessment.  CBCT/MVCT images/Port film x-rays were reviewed.  The chart was checked. Irritation in inframammary fold and axilla. Uncomfortable. Still having problems getting lymphedema sleeve. Swelling of right arm is stable.   Physical Findings: hyperpigmentation of the right "breast" with worse reaciton in axilla and inframammary fold. No moist desquamation.   Impression:  The patient is tolerating radiation.  Plan:  Continue treatment as planned. Switch to biafene and give hydrogel. Will contact PT to see about lhymphedema sleeve.

## 2013-11-30 ENCOUNTER — Ambulatory Visit
Admission: RE | Admit: 2013-11-30 | Discharge: 2013-11-30 | Disposition: A | Discharge status: Home / Self Care | Payer: 59 | Source: Ambulatory Visit | Attending: Radiation Oncology | Admitting: Radiation Oncology

## 2013-11-30 ENCOUNTER — Encounter: Payer: Self-pay | Admitting: *Deleted

## 2013-11-30 DIAGNOSIS — Z510 Encounter for antineoplastic radiation therapy: Secondary | ICD-10-CM | POA: Diagnosis not present

## 2013-11-30 NOTE — Progress Notes (Signed)
[employer] representative contacted pt at home to follow up regarding lymphadenia sleeve and offer additional support.  Patient stated she was contacted by the product company approximately 1 week ago requesting co-pay; which she provided.  Patient stated she had received a receipt for her co-pay, but had no update on the status of the sleeve.  CSW encouraged pt to call contact listed on receipt paperwork to get updated information.  Patient plans to call company and update CSW.  CSW also offered to call company on patients behalf if needed.    Johnnye Lana, MSW, [place] Worker [facility] [phone]

## 2013-12-01 ENCOUNTER — Other Ambulatory Visit: Payer: 59

## 2013-12-01 ENCOUNTER — Ambulatory Visit
Admission: RE | Admit: 2013-12-01 | Discharge: 2013-12-01 | Disposition: A | Discharge status: Home / Self Care | Payer: 59 | Source: Ambulatory Visit | Attending: Radiation Oncology | Admitting: Radiation Oncology

## 2013-12-01 ENCOUNTER — Encounter: Payer: Self-pay | Admitting: Genetic Counselor

## 2013-12-01 ENCOUNTER — Ambulatory Visit (HOSPITAL_BASED_OUTPATIENT_CLINIC_OR_DEPARTMENT_OTHER): Payer: 59 | Admitting: Genetic Counselor

## 2013-12-01 DIAGNOSIS — IMO0002 Reserved for concepts with insufficient information to code with codable children: Secondary | ICD-10-CM

## 2013-12-01 DIAGNOSIS — Z510 Encounter for antineoplastic radiation therapy: Secondary | ICD-10-CM | POA: Diagnosis not present

## 2013-12-01 DIAGNOSIS — C50919 Malignant neoplasm of unspecified site of unspecified female breast: Secondary | ICD-10-CM

## 2013-12-01 DIAGNOSIS — Z808 Family history of malignant neoplasm of other organs or systems: Secondary | ICD-10-CM

## 2013-12-01 NOTE — Progress Notes (Signed)
Patient Name: [name] Patient Age: 38 y.o. Encounter Date: 12/01/2013  Referring Physician: Lurline Del, MD  Primary Care Provider: Colvin Caroli   [name], a 38 y.o. female, is being seen at the [facility] due to a personal history of breast cancer and family history of glioblastoma.  She presents to clinic today to discuss the possibility of a hereditary predisposition to cancer and discuss whether genetic testing is warranted.  HISTORY OF PRESENT ILLNESS: [name] was diagnosed with left breast cancer ([place]) at the age of 79. At that time, she had bilateral mastectomies. The breast tumor was ER positive, PR positive, and HER2 negative.  In 04/2013, she was found to have recurrence in her left axilla. She is s/p chemotherapy and is receiving radiation. She states a BSO is planned for later this year.  Past Medical History  Diagnosis Date  . Cancer     breast ca  . Malignant neoplasm of breast (female), unspecified site     Past Surgical History  Procedure Laterality Date  . Bilateral total mastectomy with axillary lymph node dissection    . Tonsillectomy    . Leep    . Portacath placement N/A 06/16/2013    Procedure: POWER PORT PLACEMENT;  Surgeon: Shann Medal, MD;  Location: [facility];  Service: General;  Laterality: N/A;  . Axillary lymph node dissection Left 06/16/2013    Procedure: LEFT AXILLARY NODE DISSECTION;  Surgeon: Shann Medal, MD;  Location: [facility];  Service: General;  Laterality: Left;    History   Social History  . Marital Status: Single    Spouse Name: N/A    Number of Children: 0  . Years of Education: N/A   Occupational History  .  [place] History Main Topics  . Smoking status: Never Smoker   . Smokeless tobacco: Never Used  . Alcohol Use: No  . Drug Use: No  . Sexual Activity: Not on file   Other Topics Concern  . Not on file   Social History Narrative  . No narrative on file      FAMILY HISTORY:   During the visit, a 4-generation pedigree was obtained. Significant diagnoses include the following:  Family History  Problem Relation Age of Onset  . Other Mother 8    glioblastoma; deceased 51  . Other Father 14    glioblastoma; deceased 28    Of note, her father had 7 brothers and 3 sisters, most of whom are living. There are no cancers in any of them, their children or [name]'s paternal grandparents who died at ages 52 and 5. Her mother had 48 brothers and 3 sisters also most of whom are living. There are no cancers in any of them, their children or [name]'s maternal grandparents who died at old ages.  [name] ancestry is African American. There is no known Jewish ancestry and no consanguinity.  ASSESSMENT AND PLAN: [name] is a 38 y.o. female with a personal history of breast cancer and family history of glioblastoma in both her parents. While glioblastoma is rare, this history is not suggestive of a hereditary predisposition to cancer. She has a very large family with numerous unaffected family members. Given her age at breast cancer diagnosis, however, genetic testing to rule out a hereditary breast cancer susceptibility is reasonable. There are no other cancers or tumor to suggest the glioblastomas are due to a hereditary factor. We reviewed the characteristics, features and  inheritance patterns of hereditary cancer syndromes. We also discussed genetic testing, including the process of testing, insurance coverage and implications of results. A negative result will be reassuring.  [name] wished to pursue genetic testing and a blood sample will be sent to [facility] for analysis of the 17 genes on the BreastNext gene panel. We discussed the implications of a positive, negative and/ or Variant of Uncertain Significance (VUS) result. Results should be available in approximately 4-5 weeks, at which point we will contact her and address  implications for her as well as address genetic testing for at-risk family members, if needed.     We encouraged [name] to remain in contact with Cancer Genetics annually so that we can update the family history and inform her of any changes in cancer genetics and testing that may be of benefit for this family. Ms.  Elam Dutch questions were answered to her satisfaction today.   Thank you for the referral and allowing [place] to share in the care of your patient.   The patient was seen for a total of 30 minutes, greater than 50% of which was spent face-to-face counseling. This patient was discussed with the overseeing provider who agrees with the above.   Steele Berg, MS, [place] Certified Genetic Counseor phone: [phone] [email or web address]

## 2013-12-02 ENCOUNTER — Ambulatory Visit
Admission: RE | Admit: 2013-12-02 | Discharge: 2013-12-02 | Disposition: A | Discharge status: Home / Self Care | Payer: 59 | Source: Ambulatory Visit | Attending: Radiation Oncology | Admitting: Radiation Oncology

## 2013-12-02 ENCOUNTER — Ambulatory Visit: Payer: 59

## 2013-12-02 ENCOUNTER — Ambulatory Visit: Payer: 59 | Admitting: Radiation Oncology

## 2013-12-02 DIAGNOSIS — Z510 Encounter for antineoplastic radiation therapy: Secondary | ICD-10-CM | POA: Diagnosis not present

## 2013-12-05 ENCOUNTER — Ambulatory Visit: Payer: 59

## 2013-12-05 ENCOUNTER — Ambulatory Visit
Admission: RE | Admit: 2013-12-05 | Discharge: 2013-12-05 | Disposition: A | Discharge status: Home / Self Care | Payer: 59 | Source: Ambulatory Visit | Attending: Radiation Oncology | Admitting: Radiation Oncology

## 2013-12-05 DIAGNOSIS — Z510 Encounter for antineoplastic radiation therapy: Secondary | ICD-10-CM | POA: Diagnosis not present

## 2013-12-06 ENCOUNTER — Ambulatory Visit
Admission: RE | Admit: 2013-12-06 | Discharge: 2013-12-06 | Disposition: A | Discharge status: Home / Self Care | Payer: 59 | Source: Ambulatory Visit | Attending: Radiation Oncology | Admitting: Radiation Oncology

## 2013-12-06 ENCOUNTER — Ambulatory Visit: Payer: 59 | Admitting: Physical Therapy

## 2013-12-06 ENCOUNTER — Telehealth: Payer: Self-pay

## 2013-12-06 VITALS — BP 116/82 | HR 68 | Temp 98.0°F | Wt 209.5 lb

## 2013-12-06 DIAGNOSIS — C50912 Malignant neoplasm of unspecified site of left female breast: Secondary | ICD-10-CM

## 2013-12-06 DIAGNOSIS — C773 Secondary and unspecified malignant neoplasm of axilla and upper limb lymph nodes: Principal | ICD-10-CM

## 2013-12-06 DIAGNOSIS — Z510 Encounter for antineoplastic radiation therapy: Secondary | ICD-10-CM | POA: Diagnosis not present

## 2013-12-06 NOTE — Telephone Encounter (Signed)
Returned call to [place] re: pt compression garment.  Per Cheryl/ [phone] - pt insurance requires pre-authorization for which they need office notes.  Faxed notes from 11/18/13 and 10/10/13.  Transmission log sent to scan.

## 2013-12-06 NOTE — Progress Notes (Signed)
Weekly assessment of left breast.Has 3 treatments remaining of boost.Marked hyperpigmentation with small area of peeling of left axilla which is already healing.Continue application of biafine.Back pain is the same.

## 2013-12-06 NOTE — Progress Notes (Addendum)
Weekly Management Note Current Dose: 54.4  Gy  Projected Dose: 60.4 Gy   Narrative:  The patient presents for routine under treatment assessment.  CBCT/MVCT images/Port film x-rays were reviewed.  The chart was checked. Doing well. C/o pain in her left axilla and skin darkening. No peeling. Finally got lymphedema sleeve. Using aquaphor under axilla with little relief of pain.   Physical Findings: Weight: 209 lb 8 oz (95.029 kg).  DArk skin over right chest wall and axilla.   Impression:  The patient is tolerating radiation.  Plan:  Continue treatment as planned. Neosporin plus pain under arm if becomes moist. Contineu biafene to rest of chest wall. Lotion with vitamin e after 2 weeks. [place] referral. Continue PT.

## 2013-12-07 ENCOUNTER — Ambulatory Visit: Payer: 59

## 2013-12-07 ENCOUNTER — Ambulatory Visit
Admission: RE | Admit: 2013-12-07 | Discharge: 2013-12-07 | Disposition: A | Discharge status: Home / Self Care | Payer: 59 | Source: Ambulatory Visit | Attending: Radiation Oncology | Admitting: Radiation Oncology

## 2013-12-07 DIAGNOSIS — Z510 Encounter for antineoplastic radiation therapy: Secondary | ICD-10-CM | POA: Diagnosis not present

## 2013-12-08 ENCOUNTER — Ambulatory Visit: Payer: 59

## 2013-12-08 ENCOUNTER — Telehealth: Payer: Self-pay | Admitting: Nurse Practitioner

## 2013-12-08 ENCOUNTER — Ambulatory Visit
Admission: RE | Admit: 2013-12-08 | Discharge: 2013-12-08 | Disposition: A | Discharge status: Home / Self Care | Payer: 59 | Source: Ambulatory Visit | Attending: Radiation Oncology | Admitting: Radiation Oncology

## 2013-12-08 ENCOUNTER — Encounter: Payer: 59 | Admitting: Physical Therapy

## 2013-12-08 DIAGNOSIS — C50919 Malignant neoplasm of unspecified site of unspecified female breast: Secondary | ICD-10-CM

## 2013-12-08 DIAGNOSIS — Z510 Encounter for antineoplastic radiation therapy: Secondary | ICD-10-CM | POA: Diagnosis not present

## 2013-12-08 MED ORDER — BIAFINE EX EMUL
Freq: Two times a day (BID) | CUTANEOUS | Status: DC
  Administered 2013-12-08: 09:00:00 via TOPICAL

## 2013-12-08 NOTE — Telephone Encounter (Signed)
per pt to r/s appt-pt has new updated sch

## 2013-12-09 ENCOUNTER — Ambulatory Visit: Payer: 59

## 2013-12-09 ENCOUNTER — Ambulatory Visit
Admission: RE | Admit: 2013-12-09 | Discharge: 2013-12-09 | Disposition: A | Discharge status: Home / Self Care | Payer: 59 | Source: Ambulatory Visit | Attending: Radiation Oncology | Admitting: Radiation Oncology

## 2013-12-09 ENCOUNTER — Encounter: Payer: Self-pay | Admitting: Radiation Oncology

## 2013-12-09 DIAGNOSIS — Z510 Encounter for antineoplastic radiation therapy: Secondary | ICD-10-CM | POA: Diagnosis not present

## 2013-12-13 NOTE — Progress Notes (Signed)
  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   She had a flare up of lymphedema during treatment and was fitted for a sleeve. She had the normal dry desquamation during treatment.   Plan: The patient has completed radiation treatment. The patient will return to radiation oncology clinic for routine followup in one month. I advised them to call or return sooner if they have any questions or concerns related to their recovery or treatment. She also has follow up with medical oncology  ------------------------------------------------  Thea Silversmith, MD

## 2013-12-16 NOTE — Progress Notes (Signed)
Name: Chauntay Paszkiewicz   MRN: [id number]  Date:  12/05/13   DOB: Jan 29, 1976  Status:outpatient    CONSENT VERIFIED: yes   SET UP: Patient is setup supine   IMMOBILIZATION:  The following immobilization was used:Custom Moldable Pillow, breast board.   NARRATIVE: [name] underwent complex simulation and treatment planning for her boost treatment today.   The depth of her chest wallwas felt to be appropriate for treatment with electrons    6  MeV electrons will be prescribed to the 90% isodose line with a 1 cm bolus placed daily.   I personally oversaw and approved the construction of a unique block which will be used for beam modification purposes.  A special port plan is requested.

## 2013-12-23 ENCOUNTER — Other Ambulatory Visit: Payer: Self-pay | Admitting: Obstetrics and Gynecology

## 2013-12-27 ENCOUNTER — Telehealth: Payer: Self-pay | Admitting: Oncology

## 2013-12-27 NOTE — Telephone Encounter (Signed)
pt called to r/s appt..done...pt aware of new d.t °

## 2013-12-30 ENCOUNTER — Ambulatory Visit: Payer: 59 | Admitting: Nurse Practitioner

## 2014-01-03 ENCOUNTER — Encounter: Payer: Self-pay | Admitting: Genetic Counselor

## 2014-01-03 ENCOUNTER — Other Ambulatory Visit: Payer: Self-pay | Admitting: *Deleted

## 2014-01-03 DIAGNOSIS — R252 Cramp and spasm: Secondary | ICD-10-CM

## 2014-01-03 DIAGNOSIS — C50919 Malignant neoplasm of unspecified site of unspecified female breast: Secondary | ICD-10-CM

## 2014-01-03 DIAGNOSIS — C773 Secondary and unspecified malignant neoplasm of axilla and upper limb lymph nodes: Principal | ICD-10-CM

## 2014-01-03 MED ORDER — METAXALONE 800 MG PO TABS: 800.0000 mg | ORAL_TABLET | Freq: Three times a day (TID) | ORAL | Status: DC | PRN

## 2014-01-03 NOTE — Progress Notes (Signed)
GENETIC TEST RESULTS  Referring Physician: Lurline Del, MD   [name] was called today to discuss genetic test results. Please see the Genetics note from her visit on 12/01/13 for a detailed discussion of her personal and family history.  GENETIC TESTING: At the time of [name]'s visit, we recommended she pursue genetic testing of multiple genes on the BreastNext gene panel. This test, which included sequencing and deletion/duplication analysis of 17 genes, was performed at [facility]. Testing was normal and did not reveal any clearly harmful mutation in these genes. The genes tested were ATM, BARD1, BRCA1, BRCA2, BRIP1, CDH1, CHEK2, MRE11A, MUTYH, NBN, NF1, PALB2, PTEN, RAD50, RAD51C, RAD51D, and TP53.  We discussed with [name] that since the current test is not perfect, it is possible there may be a gene mutation that current testing cannot detect, but that chance is small. We also discussed that it is possible that a different genetic factor, which was not part of this testing or has not yet been discovered, is responsible for the cancer diagnoses in the family. This is not of high likelihood. While both of her parents had reportedly had a glioblastoma, there are no other reported cancers in her very large maternal and paternal families.  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 At this time, it is unknown if this variant is associated with increased cancer risk or if this is a normal finding, but most variants such as this get reclassified to being inconsequential. It should not be used to make medical management decisions. With time, we suspect the lab will determine the significance of this variant, if any. If we do learn more about it, we will try to contact [name] to discuss it further. However, it is important to stay in touch with [place] periodically and keep  the address and phone number up to date.  CANCER SCREENING: This result suggests that [name]'s cancer was most likely not due to an inherited predisposition. Most cancers happen by chance and this test, along with details of her family history, suggests that her cancer falls into this category. We, therefore, recommended she continue to follow the cancer screening guidelines provided by her physician.   FAMILY MEMBERS: Women in the family are at some increased risk of developing breast cancer, over the general population risk, simply due to the family history. We recommended they have a yearly mammogram beginning at age mid to late 77s, a yearly clinical breast exam, and perform monthly breast self-exams. A gynecologic exam is recommended yearly. Colon cancer screening is recommended to begin by age 44.  Family members are not recommended to undergo genetic testing for the above VUS unless through a research protocol.  Lastly, we discussed with [name] that cancer genetics is a rapidly advancing field and it is possible that new genetic tests will be appropriate for her in the future. We encouraged her to remain in contact with [place] on an annual basis so we can update her personal and family histories, and let her know of advances in cancer genetics that may benefit the family. Our contact number was provided. [name] questions were answered to her satisfaction today, and she knows she is welcome to call anytime with additional questions.    Steele Berg, MS, [place] Certified Genetic Counseor phone: [phone] [name]

## 2014-01-04 ENCOUNTER — Other Ambulatory Visit: Payer: Self-pay | Admitting: *Deleted

## 2014-01-04 DIAGNOSIS — F32A Depression, unspecified: Secondary | ICD-10-CM

## 2014-01-04 DIAGNOSIS — F329 Major depressive disorder, single episode, unspecified: Secondary | ICD-10-CM

## 2014-01-04 MED ORDER — VENLAFAXINE HCL 37.5 MG PO TABS: 37.5000 mg | ORAL_TABLET | Freq: Every day | ORAL | Status: DC

## 2014-01-04 MED ORDER — VENLAFAXINE HCL 75 MG PO TABS: 75.0000 mg | ORAL_TABLET | Freq: Two times a day (BID) | ORAL | Status: DC

## 2014-01-09 ENCOUNTER — Ambulatory Visit (HOSPITAL_BASED_OUTPATIENT_CLINIC_OR_DEPARTMENT_OTHER): Payer: 59 | Admitting: Nurse Practitioner

## 2014-01-09 ENCOUNTER — Telehealth: Payer: Self-pay | Admitting: Oncology

## 2014-01-09 VITALS — BP 125/83 | HR 75 | Temp 98.3°F | Resp 18 | Ht 67.0 in | Wt 206.0 lb

## 2014-01-09 DIAGNOSIS — Z170 Estrogen receptor positive status [ER+]: Secondary | ICD-10-CM

## 2014-01-09 DIAGNOSIS — C50919 Malignant neoplasm of unspecified site of unspecified female breast: Secondary | ICD-10-CM

## 2014-01-09 DIAGNOSIS — F329 Major depressive disorder, single episode, unspecified: Secondary | ICD-10-CM

## 2014-01-09 DIAGNOSIS — C50912 Malignant neoplasm of unspecified site of left female breast: Secondary | ICD-10-CM

## 2014-01-09 DIAGNOSIS — M545 Low back pain: Secondary | ICD-10-CM

## 2014-01-09 DIAGNOSIS — F32A Depression, unspecified: Secondary | ICD-10-CM

## 2014-01-09 DIAGNOSIS — F3289 Other specified depressive episodes: Secondary | ICD-10-CM

## 2014-01-09 DIAGNOSIS — I890 Lymphedema, not elsewhere classified: Secondary | ICD-10-CM

## 2014-01-09 DIAGNOSIS — C773 Secondary and unspecified malignant neoplasm of axilla and upper limb lymph nodes: Secondary | ICD-10-CM

## 2014-01-09 NOTE — Progress Notes (Signed)
 Cancer Center  [phone] [phone]     ID: [name] OB: 06/27/1975  MR#: [id number]  CSN#:[id number]  PCP: POWELL,ELMIRA, PA-C GYN:  [name] SU: [name] OTHER MD: [name], [name] (FAX [phone])  CHIEF COMPLAINT:  Locally recurrent estrogen receptor positive breast cancer CURRENT TREATMENT: Adjuvant radiation  BREAST CANCER HISTORY:  From the original intake note:  [name] had bilateral diagnostic mammography [facility] in [place] 11/30/2009 showing a 1.5 cm mass at the 9:00 position of the left breast with some satellites. A biopsy of the mass in question [name] [facility] showed ([id number]) and invasive lobular carcinoma, which was estrogen and progesterone receptor both 100% positive, both with strong staining intensity, but HER-2 negative at 1+. Bilateral breast MRI 12/13/2009 in [place] showed in the left breast an irregularly marginated mass measuring 3.2 cm. There were 4 or 5 separate satellite lesions laterally and superior to the primary.  Accordingly, after appropriate discussion, the patient underwent bilateral mastectomies with left sentinel lymph node sampling 01/02/2010. The right breast was benign. The left breast showed a 2.3 cm invasive lobular carcinoma, grade 3, with a total of 6 sentinel lymph nodes removed, all negative, although 2 showed isolated tumor cells by immunostaining. Margins were negative.  An Oncotype BX was sent, with a recurrence score of 22, predicting a risk of distant recurrence within 10 years with 14% of the patients only systemic treatment was tamoxifen. The patient was treated with CMF chemotherapy between 02/22/2010 and 07/05/2010, receiving 7 cycles at which point the patient refused further chemotherapy though there have been no major toxicities at according to the oncology note from [name]. The patient was then started on tamoxifen  08/09/2010. By her cancer took approximately 12-15 months then stopped because of hot flashes and insomnia problems.  More recently, the patient noted a change in her left axilla andunderwent bilateral breast MRI January to 2015 at [facility]. The patient has bilateral submuscular silicone implants in place. The breast were unremarkable, but there were several lymph nodes with cortical thickening in the left axilla including a dominant 1 measuring 1.2 cm in the short axis. Ultrasonography of this area identified the lymph node in question and the patient underwent biopsy of the left axillary lymph node This showed ([id number]) near-total replacement of the lymph node by metastatic carcinoma. This was 96% estrogen receptor positive, and 84% progesterone receptor positive, both with strong staining intensity. The MIB-1 was 20%. HER-2 determination is pending.  The patient's subsequent history is as detailed below  INTERVAL HISTORY: [name] returns today for follow up of her breast cancer. Since her last visit here she has completed radiation therapy. She is recovering from fatigue and some skin changes, but she tolerated it well.   REVIEW OF SYSTEMS: [name] denies fevers, chills, nausea, vomiting, or changes in bowel or bladder problems. She has no shortness of breath, palpitations, or chest pain. She has chronic back pain for which she takes skelaxin TID PRN for spasms. She has left arm lymphedema, but has a compression sleeve to wear now. She is dealing with depression, but this is stable. She denies suicidal ideation. She has not menstruated since March 2015. A detailed review of systems was otherwise stable.   PAST MEDICAL HISTORY: Past Medical History  Diagnosis Date  . Cancer     breast ca  . Malignant neoplasm of breast (female), unspecified site   . Radiation 10/24/13-12/09/13    Left chestwall/supraclav./scar      PAST SURGICAL HISTORY: Past Surgical History  Procedure Laterality  Date  . Bilateral total mastectomy with axillary lymph node dissection    . Tonsillectomy    . Leep    . Portacath placement N/A 06/16/2013    Procedure: POWER PORT PLACEMENT;  Surgeon: Stacy H Newman, MD;  Location: [facility];  Service: General;  Laterality: N/A;  . Axillary lymph node dissection Left 06/16/2013    Procedure: LEFT AXILLARY NODE DISSECTION;  Surgeon: Stacy H Newman, MD;  Location: [facility];  Service: General;  Laterality: Left;    FAMILY HISTORY Family History  Problem Relation Age of Onset  . Other Mother 52    glioblastoma; deceased 52  . Other Father 42    glioblastoma; deceased 44   according to the patient both her parents died from a primary brain cancers, namely we have the stomas, her father at 44, her mother at 52. The patient had one brother and one sister. There is no history of breast or ovarian cancer in the family  GYNECOLOGIC HISTORY:   (Reviewed 10/10/2013) Menarche age 10. The patient has never carried a child to term. She was still having regular periods at the time of her recent diagnosis. LMP 07/18/2013 as of 09/08/2013.  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 She shares a home with her significant other, [name], who works at installing cisterns. They have no pets.    ADVANCED DIRECTIVES: Not in place   HEALTH MAINTENANCE:  (Reviewed 10/10/2013) History  Substance Use Topics  . Smoking status: Never Smoker   . Smokeless tobacco: Never Used  . Alcohol Use: No     Colonoscopy: Never  PAP: December 2014  Bone density: Never  Lipid panel: Not on file   Allergies  Allergen Reactions  . Sulfa Antibiotics Swelling  . Gadolinium Derivatives Hives    After gado injection, pt had a few small hives on left breast area. Treated  with benadryl by [name]   . Petrolatum Rash    Current Outpatient Prescriptions  Medication Sig Dispense Refill  . dexamethasone (DECADRON) 4 MG tablet 2 tabs by mouth with food twice daily on day before and 3 days after each chemo  45 tablet  1  . dicyclomine (BENTYL) 10 MG capsule Take 1 capsule (10 mg total) by mouth 4 (four) times daily -  before meals and at bedtime.  60 capsule  1  . emollient (BIAFINE) cream Apply 1 application topically 2 (two) times daily. Apply cream bid and prn ,hydrogel pads prn,      . furosemide (LASIX) 20 MG tablet Take 1 tablet (20 mg total) by mouth every other day. As needed for swelling  10 tablet  0  . HYDROcodone-acetaminophen (NORCO/VICODIN) 5-325 MG per tablet Take 1-2 tablets by mouth every 6 (six) hours as needed.  30 tablet  0  . lidocaine-prilocaine (EMLA) cream Apply 1 application topically as needed. 1-2 hours before each port access  30 g  2  . LORazepam (ATIVAN) 0.5 MG tablet Take 1 tablet (0.5 mg total) by mouth at bedtime as needed for anxiety or sleep (or nausea).  30 tablet  0  . metaxalone (SKELAXIN) 800 MG tablet Take 1 tablet (800 mg total) by mouth 3 (three) times daily as needed for   muscle spasms.  60 tablet  0  . omeprazole (PRILOSEC) 40 MG capsule Take 1 capsule (40 mg total) by mouth daily.  90 capsule  3  . ondansetron (ZOFRAN) 8 MG tablet 1 tab by mouth twice daily x 3 days after each chemo, then 1 tab by mouth every 12 hrs if needed for nausea  30 tablet  1  . venlafaxine (EFFEXOR) 37.5 MG tablet Take 1 tablet (37.5 mg total) by mouth daily. Weaning patient off Effexor XR 150 mg. Effexor 150 mg qd x 10 days. Then 75 mg qd x 10 days. Then 37.5 mg x 10 days. Then 37.5 mg every other day. Stop.  15 tablet  0  . venlafaxine (EFFEXOR) 75 MG tablet Take 1 tablet (75 mg total) by mouth 2 (two) times daily.  30 tablet  0   No current facility-administered medications for this visit.    OBJECTIVE: Young African American woman who  appears  tired but is in no acute distress Filed Vitals:   01/09/14 1333  BP: 125/83  Pulse: 75  Temp: 98.3 F (36.8 C)  Resp: 18     Body mass index is 32.26 kg/(m^2).    ECOG FS:1 - Symptomatic but completely ambulatory Filed Weights   01/09/14 1333  Weight: 206 lb (93.441 kg)   Skin: warm, dry  HEENT: sclerae anicteric, conjunctivae pink, oropharynx clear. No thrush or mucositis.  Lymph Nodes: No cervical or supraclavicular lymphadenopathy  Lungs: clear to auscultation bilaterally, no rales, wheezes, or rhonci  Heart: regular rate and rhythm  Abdomen: round, soft, non tender, positive bowel sounds  Musculoskeletal: No focal spinal tenderness, left arm lymphedema Neuro: non focal, well oriented, positive affect  Breasts: deferred Breasts: Status post bilateral mastectomies and radiation. Hyperpigmentation on right side. No evidence of local recurrence. Bilateral axillae benign.  LAB RESULTS:   Lab Results  Component Value Date   WBC 3.3* 11/18/2013   NEUTROABS 1.9 11/18/2013   HGB 12.3 11/18/2013   HCT 38.6 11/18/2013   MCV 90.7 11/18/2013   PLT 224 11/18/2013      Chemistry      Component Value Date/Time   NA 141 11/18/2013 1021   NA 135* 07/16/2013 0535   K 4.1 11/18/2013 1021   K 4.4 07/16/2013 0535   CL 97 07/16/2013 0535   CO2 28 11/18/2013 1021   CO2 25 07/16/2013 0535   BUN 10.9 11/18/2013 1021   BUN 11 07/16/2013 0535   CREATININE 0.8 11/18/2013 1021   CREATININE 0.81 07/16/2013 0535      Component Value Date/Time   CALCIUM 10.8* 11/18/2013 1021   CALCIUM 10.1 07/16/2013 0535   ALKPHOS 82 11/18/2013 1021   ALKPHOS 92 07/16/2013 0535   AST 20 11/18/2013 1021   AST 16 07/16/2013 0535   ALT 14 11/18/2013 1021   ALT 14 07/16/2013 0535   BILITOT 0.35 11/18/2013 1021   BILITOT 0.9 07/16/2013 0535      STUDIES: No results found.  ASSESSMENT: 38 y.o. [name] woman  (1) status post bilateral mastectomies with left axillary lymph node sampling [6 nodes removed]  01/02/2010 for a pT2 pN0(i+), stage IIA invasive lobular breast cancer, grade 3, estrogen and progesterone receptor positive, HER-2 not amplified; right breast was benign  (2) Oncotype DX score of 22 predicted a 14% risk of distant recurrence within 10 years if the patient's only systemic treatment is tamoxifen for 5 years  (3) status post CMF(cyclophosphamide, fluorouracil, methotrexate) x7 given between October of 2011 and   March of 2012 (7 of 8 planned treatments completed)  (4) tamoxifen started April 2012, discontinued approximately July of 2013 (about 15 months) because of problems with hot flashes and insomnia  (5) pathologically documented left axillary recurrence 05/05/2013, the tumor being again estrogen and progesterone receptor positive, with HER-2 not amplified, and MIB-1 of 15%. Staging CT/PET 05/27/2013 show left axillary and supraclavicular recurrence, no distant disease  (6) status post completion left axillary lymph node dissection 06/16/2013 showing a total of 10/15 lymph nodes involved by tumor, with evidence of extracapsular extension (TX N3 = stage IIIC)  (7) treated with carboplatin/ docetaxel, first dose on 07/11/2013, repeated every 21 days x 4 with Neulasta support on day 2.  Final dose was given on 09/12/2013, with chemotherapy discontinued after 4 cycles due to continuing low counts.  (8) radiation to be completed 12/09/2013  (9) bilateral salpingo-oophorectomy planned for later this year,  hopefully as soon as patient finishes radiation, and followed by tamoxifen for at least 5 years  (10)  depression, being treated with Effexor XR plus counseling - improved   (11) GI symptoms, consistent with IBS - improved  (12)  pitting edema bilaterally in the lower extremities - secondary to Taxotere/dexamethasone- improved  (13) lymphedema, pain, and limited range of motion in the left upper extremity secondary to left axillary lymph node dissection, being treated through the  lymphedema clinic  - improved    PLAN: [name] is doing well today. During this visit we mainly discussed her future plans from the medical oncology perspective. She has scheduled her bilateral salpingo-oophorectomy with Stacy. Leo Grosser for Sept 30th. After a few weeks of recovery, she will begin tamoxifen. Stacy. Jana Hakim had previously written that she would start on anastrozole, but the patient preferred to have this switched because she took tamoxifen previously and is more comfortable with the listed side effects. Stacy. Jana Hakim was agreeable to this decision.  [name] will return to this clinic approximately 3-4 weeks after her surgery to meet with Stacy. Jana Hakim and to begin tamoxifen. We discussed potential side effects and toxicities today. She understands and agrees with this plan. She has been encouraged to call with any issues that might arise before her next visit here.  Marcelino Duster, NP   01/09/2014 4:32 PM

## 2014-01-10 ENCOUNTER — Encounter: Payer: Self-pay | Admitting: *Deleted

## 2014-01-12 ENCOUNTER — Ambulatory Visit
Admission: RE | Admit: 2014-01-12 | Discharge: 2014-01-12 | Disposition: A | Discharge status: Home / Self Care | Payer: 59 | Source: Ambulatory Visit | Attending: Radiation Oncology | Admitting: Radiation Oncology

## 2014-01-12 ENCOUNTER — Encounter (HOSPITAL_COMMUNITY): Payer: Self-pay | Admitting: Pharmacist

## 2014-01-12 VITALS — BP 117/90 | HR 84 | Temp 98.1°F | Wt 206.2 lb

## 2014-01-12 DIAGNOSIS — C773 Secondary and unspecified malignant neoplasm of axilla and upper limb lymph nodes: Principal | ICD-10-CM

## 2014-01-12 DIAGNOSIS — C50912 Malignant neoplasm of unspecified site of left female breast: Secondary | ICD-10-CM

## 2014-01-12 NOTE — Progress Notes (Signed)
One month follow up completion of radiation to left breast.Continued back pain.Takes skelaxin for this.Skin has healed just hyperpigmentation.Contine to apply lotion with vitamin e.Scheduled for oophorectoy on 01/25/14 by Dr. Leo Grosser.Start anti-estrogen therapy after surgery.Genetic counseling done on 01/03/14.

## 2014-01-12 NOTE — Progress Notes (Signed)
   Department of Radiation Oncology  Phone:  [phone] Fax:        [phone]   Name: Michelle Wnek MRN: [id number]  DOB: 1975/05/31  Date: 01/12/2014  Follow Up Visit Note  Diagnosis: Left axillary recurrence of breast cancer  Summary and Interval since last radiation: 60.4 Gy to the left breast/reconstructed breast completed 12/09/13  Interval History: [name] presents today for routine followup.  She is doing well. She is scheduled for her oopherectomy on 9/30. Her skin is healing and she is using lotion with vitamin e there. She is considering a career change. Her energy levels are improving. Her arm is much less swollen and painful. She wears her sleeve prn.   Allergies:  Allergies  Allergen Reactions  . Sulfa Antibiotics Hives and Itching  . Gadolinium Derivatives Hives    After gado injection, pt had a few small hives on left breast area. Treated with benadryl by dr Janeece Fitting   . Petrolatum Hives and Rash    Medications:  Current Outpatient Prescriptions  Medication Sig Dispense Refill  . metaxalone (SKELAXIN) 800 MG tablet Take 1 tablet (800 mg total) by mouth 3 (three) times daily as needed for muscle spasms.  60 tablet  0  . omeprazole (PRILOSEC) 40 MG capsule Take 1 capsule (40 mg total) by mouth daily.  90 capsule  3  . VENTOLIN HFA 108 (90 BASE) MCG/ACT inhaler        No current facility-administered medications for this encounter.    Physical Exam:  Filed Vitals:   01/12/14 1542  BP: 117/90  Pulse: 84  Temp: 98.1 F (36.7 C)  Weight: 206 lb 3.2 oz (93.532 kg)   Healing hyperpigmented skin over left chest wall and skin. SCLV are ais still dark  IMPRESSION: May is a 38 y.o. female s/p RT for her left axillary recurrence.   PLAN:  She is doing well. We discussed the need for follow up every 4-6 months which she has scheduled.  We discussed the need for yearly mammograms which she can schedule with her OBGYN or with medical oncology. We discussed  the need for sun protection in the treated area.  She can always call me with questions.  I will follow up with her on an as needed basis. She will continue using her sleeve and knows to protect that arm from blood pressure/needle sticks/etc.  She will follow up with medical oncology to start her antiestrogen after her surgery.      Thea Silversmith, MD

## 2014-01-13 ENCOUNTER — Encounter: Payer: Self-pay | Admitting: Emergency Medicine

## 2014-01-13 NOTE — Progress Notes (Signed)
Letter for extended disability faxed to [phone] per patient's request.

## 2014-01-17 ENCOUNTER — Other Ambulatory Visit (HOSPITAL_COMMUNITY): Payer: Self-pay | Admitting: Obstetrics and Gynecology

## 2014-01-17 ENCOUNTER — Encounter (HOSPITAL_COMMUNITY)
Admission: RE | Admit: 2014-01-17 | Discharge: 2014-01-17 | Disposition: A | Discharge status: Home / Self Care | Payer: 59 | Source: Ambulatory Visit | Attending: Obstetrics and Gynecology | Admitting: Obstetrics and Gynecology

## 2014-01-17 ENCOUNTER — Encounter (HOSPITAL_COMMUNITY): Payer: Self-pay

## 2014-01-17 DIAGNOSIS — Z01812 Encounter for preprocedural laboratory examination: Secondary | ICD-10-CM | POA: Diagnosis present

## 2014-01-17 DIAGNOSIS — C50919 Malignant neoplasm of unspecified site of unspecified female breast: Secondary | ICD-10-CM | POA: Diagnosis present

## 2014-01-17 DIAGNOSIS — N803 Endometriosis of pelvic peritoneum, unspecified: Secondary | ICD-10-CM | POA: Insufficient documentation

## 2014-01-17 HISTORY — DX: Muscle spasm of back: M62.830

## 2014-01-17 HISTORY — DX: Gastro-esophageal reflux disease without esophagitis: K21.9

## 2014-01-17 LAB — CBC
HCT: 38.1 % (ref 36.0–46.0)
HEMOGLOBIN: 12.4 g/dL (ref 12.0–15.0)
MCH: 28.7 pg (ref 26.0–34.0)
MCHC: 32.5 g/dL (ref 30.0–36.0)
MCV: 88.2 fL (ref 78.0–100.0)
Platelets: 206 10*3/uL (ref 150–400)
RBC: 4.32 MIL/uL (ref 3.87–5.11)
RDW: 13.8 % (ref 11.5–15.5)
WBC: 3.6 10*3/uL — ABNORMAL LOW (ref 4.0–10.5)
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
## 2014-01-17 NOTE — Patient Instructions (Addendum)
   Your procedure is scheduled on:  Wednesday, Sept 30  Enter through the [employer] [facility] at:  6 AM Pick up the phone at the desk and dial [phone] and inform us of your arrival.  Please call this number if you have any problems the morning of surgery: [phone]  Remember: Do not eat or drink after midnight: Tuesday Take these medicines the morning of surgery with a SIP OF WATER:  Omeprazole.  Bring albuterol inhaler with you on day of surgery.  Do not wear jewelry, make-up, or FINGER nail polish No metal in your hair or on your body. Do not wear lotions, powders, perfumes.  You may wear deodorant.  Do not bring valuables to the hospital. Contacts, dentures or bridgework may not be worn into surgery.  Patients discharged on the day of surgery will not be allowed to drive home.  Home with significant other "[name]" cell [phone].

## 2014-01-20 ENCOUNTER — Ambulatory Visit (INDEPENDENT_AMBULATORY_CARE_PROVIDER_SITE_OTHER): Payer: 59 | Admitting: Surgery

## 2014-01-24 NOTE — H&P (Signed)
  History and Physical Interval Note:   01/24/2014   10:44 PM   [name]  has presented today for surgery, with the diagnosis of Breast Cancer  The various methods of treatment have been discussed with the patient and family. After consideration of risks, benefits and other options for treatment, the patient has consented to  Procedure(s): LAPAROSCOPIC BILATERAL SALPINGO OOPHORECTOMY.  Given pt's hx of endometriosis and prior ovarian cystectomy there may be need fo laparotomy to accomplish removal of tubes and ovaries. REMOVAL PORT-A-CATH as a surgical intervention .  I have reviewed the patients' chart and labs.  Questions were answered to the patient's satisfaction.     Eldred Manges  MD

## 2014-01-25 ENCOUNTER — Encounter (HOSPITAL_COMMUNITY): Payer: 59 | Admitting: Anesthesiology

## 2014-01-25 ENCOUNTER — Ambulatory Visit (HOSPITAL_COMMUNITY): Payer: 59 | Admitting: Anesthesiology

## 2014-01-25 ENCOUNTER — Ambulatory Visit (HOSPITAL_COMMUNITY)
Admission: RE | Admit: 2014-01-25 | Discharge: 2014-01-25 | Disposition: A | Discharge status: Home / Self Care | Payer: 59 | Source: Ambulatory Visit | Attending: Obstetrics and Gynecology | Admitting: Obstetrics and Gynecology

## 2014-01-25 ENCOUNTER — Encounter (HOSPITAL_COMMUNITY)
Admission: RE | Disposition: A | Discharge status: Home / Self Care | Payer: Self-pay | Source: Ambulatory Visit | Attending: Obstetrics and Gynecology

## 2014-01-25 ENCOUNTER — Encounter (HOSPITAL_COMMUNITY): Payer: Self-pay

## 2014-01-25 DIAGNOSIS — N736 Female pelvic peritoneal adhesions (postinfective): Secondary | ICD-10-CM

## 2014-01-25 DIAGNOSIS — C50919 Malignant neoplasm of unspecified site of unspecified female breast: Secondary | ICD-10-CM | POA: Insufficient documentation

## 2014-01-25 DIAGNOSIS — C773 Secondary and unspecified malignant neoplasm of axilla and upper limb lymph nodes: Secondary | ICD-10-CM

## 2014-01-25 DIAGNOSIS — Z4002 Encounter for prophylactic removal of ovary: Secondary | ICD-10-CM | POA: Diagnosis present

## 2014-01-25 DIAGNOSIS — N801 Endometriosis of ovary: Secondary | ICD-10-CM | POA: Diagnosis present

## 2014-01-25 DIAGNOSIS — IMO0002 Reserved for concepts with insufficient information to code with codable children: Secondary | ICD-10-CM

## 2014-01-25 DIAGNOSIS — N80109 Endometriosis of ovary, unspecified side, unspecified depth: Secondary | ICD-10-CM | POA: Diagnosis present

## 2014-01-25 DIAGNOSIS — Z9010 Acquired absence of unspecified breast and nipple: Secondary | ICD-10-CM | POA: Diagnosis not present

## 2014-01-25 DIAGNOSIS — Z452 Encounter for adjustment and management of vascular access device: Secondary | ICD-10-CM | POA: Insufficient documentation

## 2014-01-25 DIAGNOSIS — D252 Subserosal leiomyoma of uterus: Secondary | ICD-10-CM | POA: Diagnosis present

## 2014-01-25 DIAGNOSIS — C50912 Malignant neoplasm of unspecified site of left female breast: Secondary | ICD-10-CM

## 2014-01-25 DIAGNOSIS — N803 Endometriosis of pelvic peritoneum: Secondary | ICD-10-CM

## 2014-01-25 HISTORY — PX: LAPAROSCOPIC BILATERAL SALPINGO OOPHERECTOMY: SHX5890

## 2014-01-25 HISTORY — PX: PORT-A-CATH REMOVAL: SHX5289

## 2014-01-25 LAB — PREGNANCY, URINE: Preg Test, Ur: NEGATIVE

## 2014-01-25 SURGERY — SALPINGO-OOPHORECTOMY, BILATERAL, LAPAROSCOPIC
Anesthesia: General | Site: Chest

## 2014-01-25 MED ORDER — LIDOCAINE HCL (CARDIAC) 20 MG/ML IV SOLN
INTRAVENOUS | Status: DC | PRN
  Administered 2014-01-25: 50 mg via INTRAVENOUS

## 2014-01-25 MED ORDER — MEPERIDINE HCL 25 MG/ML IJ SOLN
6.2500 mg | INTRAMUSCULAR | Status: DC | PRN
Start: 2014-01-25 — End: 2014-01-25

## 2014-01-25 MED ORDER — FENTANYL CITRATE 0.05 MG/ML IJ SOLN
INTRAMUSCULAR | Status: AC
  Filled 2014-01-25: qty 2

## 2014-01-25 MED ORDER — PROPOFOL 10 MG/ML IV BOLUS
INTRAVENOUS | Status: DC | PRN
  Administered 2014-01-25: 200 mg via INTRAVENOUS

## 2014-01-25 MED ORDER — KETOROLAC TROMETHAMINE 30 MG/ML IJ SOLN
INTRAMUSCULAR | Status: DC | PRN
  Administered 2014-01-25: 60 mg via INTRAVENOUS

## 2014-01-25 MED ORDER — LIDOCAINE HCL 1 % IJ SOLN
INTRAMUSCULAR | Status: DC | PRN
  Administered 2014-01-25: 5 mL

## 2014-01-25 MED ORDER — MIDAZOLAM HCL 2 MG/2ML IJ SOLN
INTRAMUSCULAR | Status: DC | PRN
  Administered 2014-01-25: 2 mg via INTRAVENOUS

## 2014-01-25 MED ORDER — KETOROLAC TROMETHAMINE 30 MG/ML IJ SOLN: 15.0000 mg | Freq: Once | INTRAMUSCULAR | Status: DC | PRN

## 2014-01-25 MED ORDER — OXYCODONE-ACETAMINOPHEN 5-325 MG PO TABS
1.0000 | ORAL_TABLET | ORAL | Status: DC | PRN
Start: 2014-01-25 — End: 2014-08-08

## 2014-01-25 MED ORDER — BUPIVACAINE HCL (PF) 0.25 % IJ SOLN
INTRAMUSCULAR | Status: AC
  Filled 2014-01-25: qty 30

## 2014-01-25 MED ORDER — ONDANSETRON HCL 4 MG/2ML IJ SOLN
4.0000 mg | Freq: Once | INTRAMUSCULAR | Status: AC | PRN
  Administered 2014-01-25: 4 mg via INTRAVENOUS

## 2014-01-25 MED ORDER — FENTANYL CITRATE 0.05 MG/ML IJ SOLN
INTRAMUSCULAR | Status: AC
  Filled 2014-01-25: qty 5

## 2014-01-25 MED ORDER — ROCURONIUM BROMIDE 100 MG/10ML IV SOLN
INTRAVENOUS | Status: AC
  Filled 2014-01-25: qty 1

## 2014-01-25 MED ORDER — ONDANSETRON HCL 4 MG/2ML IJ SOLN: 4.0000 mg | Freq: Once | INTRAMUSCULAR | Status: DC

## 2014-01-25 MED ORDER — GLYCOPYRROLATE 0.2 MG/ML IJ SOLN
INTRAMUSCULAR | Status: AC
  Filled 2014-01-25: qty 3

## 2014-01-25 MED ORDER — GLYCOPYRROLATE 0.2 MG/ML IJ SOLN
INTRAMUSCULAR | Status: DC | PRN
  Administered 2014-01-25: 0.1 mg via INTRAVENOUS
  Administered 2014-01-25: 0.6 mg via INTRAVENOUS

## 2014-01-25 MED ORDER — NEOSTIGMINE METHYLSULFATE 10 MG/10ML IV SOLN
INTRAVENOUS | Status: DC | PRN
  Administered 2014-01-25: 4 mg via INTRAVENOUS

## 2014-01-25 MED ORDER — DEXAMETHASONE SODIUM PHOSPHATE 4 MG/ML IJ SOLN
INTRAMUSCULAR | Status: AC
  Filled 2014-01-25: qty 1

## 2014-01-25 MED ORDER — BUPIVACAINE HCL (PF) 0.25 % IJ SOLN
INTRAMUSCULAR | Status: DC | PRN
  Administered 2014-01-25: 10 mL

## 2014-01-25 MED ORDER — LACTATED RINGERS IV SOLN
INTRAVENOUS | Status: DC | PRN
  Administered 2014-01-25: 1000 mL via INTRAVENOUS

## 2014-01-25 MED ORDER — HYDROMORPHONE HCL 1 MG/ML IJ SOLN
0.2500 mg | INTRAMUSCULAR | Status: DC | PRN
  Administered 2014-01-25 (×2): 0.5 mg via INTRAVENOUS

## 2014-01-25 MED ORDER — HYDROMORPHONE HCL 1 MG/ML IJ SOLN
INTRAMUSCULAR | Status: DC | PRN
  Administered 2014-01-25 (×2): 1 mg via INTRAVENOUS

## 2014-01-25 MED ORDER — ONDANSETRON HCL 4 MG/2ML IJ SOLN
INTRAMUSCULAR | Status: AC
  Filled 2014-01-25: qty 2

## 2014-01-25 MED ORDER — ROCURONIUM BROMIDE 100 MG/10ML IV SOLN
INTRAVENOUS | Status: DC | PRN
  Administered 2014-01-25: 50 mg via INTRAVENOUS
  Administered 2014-01-25 (×2): 20 mg via INTRAVENOUS
  Administered 2014-01-25: 10 mg via INTRAVENOUS
  Administered 2014-01-25: 20 mg via INTRAVENOUS

## 2014-01-25 MED ORDER — HYDROMORPHONE HCL 1 MG/ML IJ SOLN
INTRAMUSCULAR | Status: AC
  Filled 2014-01-25: qty 1

## 2014-01-25 MED ORDER — ACETAMINOPHEN 160 MG/5ML PO SOLN
ORAL | Status: AC
  Filled 2014-01-25: qty 40.6

## 2014-01-25 MED ORDER — FENTANYL CITRATE 0.05 MG/ML IJ SOLN
INTRAMUSCULAR | Status: DC | PRN
  Administered 2014-01-25: 50 ug via INTRAVENOUS
  Administered 2014-01-25 (×5): 100 ug via INTRAVENOUS

## 2014-01-25 MED ORDER — MIDAZOLAM HCL 2 MG/2ML IJ SOLN
INTRAMUSCULAR | Status: AC
  Filled 2014-01-25: qty 2

## 2014-01-25 MED ORDER — PROPOFOL 10 MG/ML IV EMUL
INTRAVENOUS | Status: AC
  Filled 2014-01-25: qty 20

## 2014-01-25 MED ORDER — DEXAMETHASONE SODIUM PHOSPHATE 10 MG/ML IJ SOLN
INTRAMUSCULAR | Status: DC | PRN
  Administered 2014-01-25: 4 mg via INTRAVENOUS

## 2014-01-25 MED ORDER — LIDOCAINE HCL 1 % IJ SOLN
INTRAMUSCULAR | Status: AC
  Filled 2014-01-25: qty 20

## 2014-01-25 MED ORDER — SCOPOLAMINE 1 MG/3DAYS TD PT72
1.0000 | MEDICATED_PATCH | Freq: Once | TRANSDERMAL | Status: DC
  Administered 2014-01-25: 1.5 mg via TRANSDERMAL

## 2014-01-25 MED ORDER — HEPARIN SODIUM (PORCINE) 5000 UNIT/ML IJ SOLN
INTRAMUSCULAR | Status: AC
  Filled 2014-01-25: qty 1

## 2014-01-25 MED ORDER — GLYCOPYRROLATE 0.2 MG/ML IJ SOLN
INTRAMUSCULAR | Status: AC
  Filled 2014-01-25: qty 1

## 2014-01-25 MED ORDER — LACTATED RINGERS IV SOLN
INTRAVENOUS | Status: DC
  Administered 2014-01-25: 08:00:00 via INTRAVENOUS
  Administered 2014-01-25: 10 mL/h via INTRAVENOUS
  Administered 2014-01-25 (×2): via INTRAVENOUS

## 2014-01-25 MED ORDER — KETOROLAC TROMETHAMINE 30 MG/ML IJ SOLN
INTRAMUSCULAR | Status: AC
  Filled 2014-01-25: qty 1

## 2014-01-25 MED ORDER — LIDOCAINE HCL (CARDIAC) 20 MG/ML IV SOLN
INTRAVENOUS | Status: AC
  Filled 2014-01-25: qty 5

## 2014-01-25 MED ORDER — SCOPOLAMINE 1 MG/3DAYS TD PT72
MEDICATED_PATCH | TRANSDERMAL | Status: AC
  Filled 2014-01-25: qty 1

## 2014-01-25 MED ORDER — ONDANSETRON HCL 4 MG/2ML IJ SOLN
INTRAMUSCULAR | Status: DC | PRN
  Administered 2014-01-25: 4 mg via INTRAVENOUS

## 2014-01-25 MED ORDER — ACETAMINOPHEN 160 MG/5ML PO SOLN
975.0000 mg | Freq: Once | ORAL | Status: AC
  Administered 2014-01-25: 975 mg via ORAL

## 2014-01-25 MED ORDER — IBUPROFEN 600 MG PO TABS: ORAL_TABLET | ORAL | Status: DC

## 2014-01-25 MED ORDER — NEOSTIGMINE METHYLSULFATE 10 MG/10ML IV SOLN
INTRAVENOUS | Status: AC
  Filled 2014-01-25: qty 1

## 2014-01-25 SURGICAL SUPPLY — 36 items
CABLE HIGH FREQUENCY MONO STRZ (ELECTRODE) IMPLANT
CHLORAPREP W/TINT 26ML (MISCELLANEOUS) ×6 IMPLANT
CLOTH BEACON ORANGE TIMEOUT ST (SAFETY) ×3 IMPLANT
CONTAINER PREFILL 10% NBF 60ML (FORM) ×3 IMPLANT
DERMABOND ADVANCED (GAUZE/BANDAGES/DRESSINGS) ×1
DERMABOND ADVANCED .7 DNX12 (GAUZE/BANDAGES/DRESSINGS) ×2 IMPLANT
DRSG COVADERM PLUS 2X2 (GAUZE/BANDAGES/DRESSINGS) ×3 IMPLANT
DRSG OPSITE POSTOP 3X4 (GAUZE/BANDAGES/DRESSINGS) ×3 IMPLANT
DRSG OPSITE POSTOP 4X10 (GAUZE/BANDAGES/DRESSINGS) ×3 IMPLANT
DRSG TELFA 3X8 NADH (GAUZE/BANDAGES/DRESSINGS) IMPLANT
GLOVE SURG SS PI 6.5 STRL IVOR (GLOVE) ×6 IMPLANT
GOWN STRL REUS W/TWL LRG LVL3 (GOWN DISPOSABLE) ×15 IMPLANT
NS IRRIG 1000ML POUR BTL (IV SOLUTION) ×3 IMPLANT
PACK LAPAROSCOPY BASIN (CUSTOM PROCEDURE TRAY) ×3 IMPLANT
POUCH SPECIMEN RETRIEVAL 10MM (ENDOMECHANICALS) ×6 IMPLANT
PROTECTOR NERVE ULNAR (MISCELLANEOUS) ×6 IMPLANT
SET IRRIG TUBING LAPAROSCOPIC (IRRIGATION / IRRIGATOR) IMPLANT
SHEARS HARMONIC ACE PLUS 36CM (ENDOMECHANICALS) IMPLANT
STRIP CLOSURE SKIN 1/4X3 (GAUZE/BANDAGES/DRESSINGS) IMPLANT
SUT MNCRL AB 3-0 PS2 27 (SUTURE) ×3 IMPLANT
SUT MNCRL AB 4-0 PS2 18 (SUTURE) IMPLANT
SUT SILK 2 0 FS (SUTURE) ×3 IMPLANT
SUT VIC AB 3-0 PS2 18 (SUTURE) ×1
SUT VIC AB 3-0 PS2 18XBRD (SUTURE) ×2 IMPLANT
SUT VICRYL 0 ENDOLOOP (SUTURE) ×15 IMPLANT
SUT VICRYL 0 UR6 27IN ABS (SUTURE) ×6 IMPLANT
SYR 50ML LL SCALE MARK (SYRINGE) ×3 IMPLANT
SYRINGE 20CC LL (MISCELLANEOUS) ×3 IMPLANT
TOWEL OR 17X24 6PK STRL BLUE (TOWEL DISPOSABLE) ×6 IMPLANT
TRAY FOLEY CATH 14FR (SET/KITS/TRAYS/PACK) ×3 IMPLANT
TROCAR BALL TOP DISP 5MM (ENDOMECHANICALS) ×3 IMPLANT
TROCAR BALLN 12MMX100 BLUNT (TROCAR) ×3 IMPLANT
TROCAR XCEL DIL TIP R 11M (ENDOMECHANICALS) IMPLANT
TROCAR XCEL NON-BLD 11X100MML (ENDOMECHANICALS) ×3 IMPLANT
WARMER LAPAROSCOPE (MISCELLANEOUS) IMPLANT
WATER STERILE IRR 1000ML POUR (IV SOLUTION) IMPLANT

## 2014-01-25 NOTE — H&P (Signed)
[name]  DOB: May 31, 1975  MRN: [id number]   [place]   ASSESSMENT AND PLAN:  1. Recurrent breast cancer in left axilla   Core biopsy left axillary lymph node -05/05/2013 (Accession#: [id number]) - metastatic mammary carcinoma   The patient has already had bilateral mastectomies.   Oncologist - [place]/Magrinat   I did a left axillary node dissection on 06/17/2103. Path (Accession#: [id number]) - 10/15 nodes with extranodal tumor   Left axillary drain removed today. She needs to work on physical activity with left arm. Made referral to ABC class.  Plan for breast cancer/nodes: 1) chemotx every 3 weeks for 6 courses, 2) Radiation tx, 3) Anti-hormone pill, 4) Oophorectomy  She'll see me back in 6 months.   [Genetic tests were negative. DN 01/08/2014]   She is here with her boyfriend, Grayland Ormond.  I see her about one month post op. Dr. Leo Grosser is going to do a bilateral oophorectomy.  2. Hypermetabolic left supraclavicular node seen on PET - 05/27/2013  3. Power port placement, right IJ - 06/16/2013 - D. Tam Delisle  4. History of bilateral mastectomy for breast cancer   Chief Complaint   Patient presents with   .  Routine Post Op     f/u drain    REFERRING PHYSICIAN: POWELL,ELMIRA, PA-C   HISTORY OF PRESENT ILLNESS:  [name] is a 38 y.o. (DOB: 08/29/1975) AA female whose primary care physician is POWELL,ELMIRA, PA-C and comes for followup of power port placement and left axillary lymph node dissection.  She comes by self.  She met with Magrinat last week and has a plan going forward. She needs to work on using her left arm more. We reviewed exercises and I am sending her to the ABC class.   History of breast cancer:  [name] underwent bilateral mastectomy, chemothx with CMF and 15 months of tamoxifen for a left breast cancer identified in October of 2011 in [place]. But she did not tolerate the tamoxifen and stopped it herself.  She was treated by [facility]  in [facility], Dr. Annette Stable. She had a bilateral mastectomy 01/28/2010. 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 2/6 left axillary nodes showed isolated tumor cells, but the others were negative. The cancer was ER/PR positive and Her2Neu negative. She was treated with CMF starting 02/22/2010 - 07/05/2010. Tissue expander 'replaced" by Dr. Westley Chandler on 09/11/2010 after right side got infected  Oncotype DX score of 22 with recurrence rate of 14%.  Recent evaluation showed a left axillary mass. She underwent a core biopsy of the left axilla on 05/05/2013 (Accession#: [id number]) which showed metastatic mammary carcinoma.  She has no children. Her LMP was 05/02/2013. She is on no hormone therapy. She underwent BRAC testing which she says was negative.   Past Medical History   Diagnosis  Date   .  Cancer      breast ca    Current Outpatient Prescriptions   Medication  Sig  Dispense  Refill   .  HYDROcodone-acetaminophen (NORCO/VICODIN) 5-325 MG per tablet  Take 1-2 tablets by mouth every 6 (six) hours as needed.  30 tablet  0    No current facility-administered medications for this visit.    Allergies   Allergen  Reactions   .  Sulfa Antibiotics  Swelling   .  Gadolinium Derivatives  Hives     After gado injection, pt had a few small hives on  left breast area. Treated with benadryl by dr Janeece Fitting   .  Petrolatum  Rash    REVIEW OF SYSTEMS:  Infection: She did have an infection post mastectomy/tissue expander which necessitated removing the tissue expander.   SOCIAL and FAMILY HISTORY:  Unmarried.  Boyfriend, Susa Raring, has come with patient in the past.  She moved to [facility] in June 2013 to be near boyfriend.  No children.  Works for [employer], Corporate treasurer   PHYSICAL EXAM:  BP 128/82  Pulse 76  Temp(Src) 98.8 F (37.1 C) (Oral)  Resp 14  Ht 5' 7"  (1.702 m)  Wt 206 lb (93.441 kg)  BMI 32.26  kg/m2  General: AA F who is alert and generally healthy appearing.  HEENT: Normal. Pupils equal.  Neck: Supple. Sore at site of IJ stick on right.  Lymph Nodes: No supraclavicular or cervical nodes. Left axillary incision looks okay. She is tender around the drain. She says that the drain has been less than 20 cc/day (she lost her sheet that had the numbers).  Breast: Right - reconstructed, no mass. Power port in right upper chest. The wound is clean.  Left - reconstructed, no mass   DATA REVIEWED:  Path report to patient.  Alphonsa Overall, MD, [facility], [address] [place], [place]  Phone: [phone] FAX: [phone]

## 2014-01-25 NOTE — OR Nursing (Signed)
Added Fibroids to the post op diagnosis per Dr. Leo Grosser.

## 2014-01-25 NOTE — Discharge Instructions (Signed)
Call [facility] @ [phone] if:  You have a temperature greater than or equal to 100.4 degrees Farenheit orally You have pain that is not made better by the pain medication given and taken as directed You have excessive bleeding or problems urinating  Take Colace (Docusate Sodium/Stool Softener) 100 mg 2-3 times daily while taking narcotic pain medicine to avoid constipation or until bowel movements are regular.  You may drive after 1 week You may walk up steps You may shower tomorrow  You may resume a regular diet Keep incisions clean and dry  Do not lift over 15 pounds for 6 weeks Avoid anything in vagina  until after your post-operative visit  Keep follow up appointment with Dr. Leo Grosser on February 08, 2014 at 2 p.m. DISCHARGE INSTRUCTIONS: Laparoscopy  The following instructions have been prepared to help you care for yourself upon your return home today.  Wound care:  Do not get the incision wet for the first 24 hours. The incision should be kept clean and dry.  The Band-Aids or dressings may be removed the day after surgery.  Should the incision become sore, red, and swollen after the first week, check with your doctor.  Personal hygiene:  Shower the day after your procedure.  Activity and limitations:  Do NOT drive or operate any equipment today.  Do NOT lift anything more than 15 pounds for 2-3 weeks after surgery.  Do NOT rest in bed all day.  Walking is encouraged. Walk each day, starting slowly with 5-minute walks 3 or 4 times a day. Slowly increase the length of your walks.  Walk up and down stairs slowly.  Do NOT do strenuous activities, such as golfing, playing tennis, bowling, running, biking, weight lifting, gardening, mowing, or vacuuming for 2-4 weeks. Ask your doctor when it is okay to start.  Diet: Eat a light meal as desired this evening. You may resume your usual diet tomorrow.  Return to work: This is dependent on the type of  work you do. For the most part you can return to a desk job within a week of surgery. If you are more active at work, please discuss this with your doctor.  What to expect after your surgery: You may have a slight burning sensation when you urinate on the first day. You may have a very small amount of blood in the urine. Expect to have a small amount of vaginal discharge/light bleeding for 1-2 weeks. It is not unusual to have abdominal soreness and bruising for up to 2 weeks. You may be tired and need more rest for about 1 week. You may experience shoulder pain for 24-72 hours. Lying flat in bed may relieve it.  Call your doctor for any of the following:  Develop a fever of 100.4 or greater  Inability to urinate 6 hours after discharge from hospital  Severe pain not relieved by pain medications  Persistent of heavy bleeding at incision site  Redness or swelling around incision site after a week  Increasing nausea or vomiting  Patient Signature________________________________________ Nurse Signature_________________________________________

## 2014-01-25 NOTE — Anesthesia Preprocedure Evaluation (Signed)
Anesthesia Evaluation    Reviewed: Allergy & Precautions, H&P , NPO status , Patient's Chart, lab work & pertinent test results, reviewed documented beta blocker date and time   Airway Mallampati: II TM Distance: >3 FB Neck ROM: full    Dental no notable dental hx. (+) Teeth Intact   Pulmonary neg pulmonary ROS,    Pulmonary exam normal       Cardiovascular negative cardio ROS      Neuro/Psych negative neurological ROS     GI/Hepatic negative GI ROS, Neg liver ROS, GERD-  Medicated and Controlled,  Endo/Other  negative endocrine ROS  Renal/GU negative Renal ROS     Musculoskeletal   Abdominal Normal abdominal exam  (+)   Peds  Hematology   Anesthesia Other Findings   Reproductive/Obstetrics negative OB ROS                           Anesthesia Physical Anesthesia Plan  ASA: II  Anesthesia Plan: General   Post-op Pain Management:    Induction: Intravenous  Airway Management Planned: Oral ETT  Additional Equipment:   Intra-op Plan:   Post-operative Plan: Extubation in OR  Informed Consent: I have reviewed the patients History and Physical, chart, labs and discussed the procedure including the risks, benefits and alternatives for the proposed anesthesia with the patient or authorized representative who has indicated his/her understanding and acceptance.   Dental Advisory Given  Plan Discussed with: CRNA and Surgeon  Anesthesia Plan Comments:         Anesthesia Quick Evaluation

## 2014-01-25 NOTE — Op Note (Signed)
Diagnostic Laparoscopy Procedure Note  Indications: The patient is a 38 y.o. female with recurrent left breast cancer, who presents for risk reduction surgery of bilateral salpingo-oophorectomy  Pre-operative Diagnosis: Recurrent breast cancer.     Endometriosis  Post-operative Diagnosis: Recurrent breast cancer     Endometriosis     Uterine fibroids     Pelvic adhesions  Surgeon: Kendall Flack P   Assistants: Gwenith Spitz certified physician assistant  Anesthesia: General endotracheal anesthesia  ASA Class: 2  Procedure Details  The patient was seen in the Holding Room. The risks, benefits, complications, treatment options, and expected outcomes were discussed with the patient. The possibilities of reaction to medication, pulmonary aspiration, perforation of viscus, bleeding, recurrent infection, the need for additional procedures, failure to diagnose a condition, and creating a complication requiring transfusion or operation were discussed with the patient. The patient concurred with the proposed plan, giving informed consent. The patient was taken to the Operating Room, identified as [name] and the procedure verified as laparoscopic bilateral salpingo-oophorectomy. A Time Out was held and the above information confirmed.  After induction of general anesthesia, the patient underwent removal of a Port-A-Cath by Dr. Alphonsa Overall, which is dictated under a separate operative report. At the completion of this procedure she was placed in modified dorsal lithotomy position where she was prepped, draped, and catheterized in the normal, sterile fashion.  The cervix was visualized and an intrauterine manipulator was placed. A 1 cm umbilical incision was then performed and with a combination of blunt and sharp dissection. The fascia was identified and incised. The peritoneum was entered bluntly. The peritoneum and fascia were tagged with sutures of 0 Vicryl on either side of the  incision. The Hassan cannula was then placed into the peritoneal cavity and secured with the holding sutures. Laparoscope was placed through the trocar sleeve. Suprapubic incisions were made to the right and left of midline and a left 5 mm trocar and the right 10 mm trocar were placed into the peritoneal cavity. Under direct visualization. The above findings were noted.  The right ovary was noted to be densely adherent to the right pelvic sidewall. A combination of sharp and hydrodissection was undertaken to free the ovary from the pelvic sidewall being careful to stay clear of the ureter, which was easily visible. The utero-ovarian ligament was then cauterized and cut. The uterotubal connections was then cauterized and cut. The mesial salpinx was dissected and cauterized toward the infundibulopelvic ligament to isolate it. The once this pedicle had been created, and Endoloop of 0 Vicryl was placed around the infundibulopelvic ligament. Approximately 2 cm from the base of the ovary and tied down. A second Endoloop was placed and the infundibulopelvic ligament cut, freeing the right tube and ovary for placement in the anterior cul-de-sac for later removal. Epistasis was noted to be adequate. On the left side. The fallopian tube was noted to be adherent to the descending colon, as well as to the pelvic sidewall. A combination of hydrodissection and sharp dissection allowed establishment of the fimbriated end of the tube. The left ovary was likewise densely adherent to the pelvic sidewall. The left ureter was identified and the ovary sharply dissected off the left pelvic sidewall. Once the utero-ovarian ligament could be identified, it was cauterized and cut. The uterotubal junction, was likewise cauterized and cut with that incision being taken along the mesial salpinx to again create a pedicle of the infundibulopelvic ligament. 0 Vicryl Endoloops were placed around the infundibulopelvic ligament  and it was cut,  allowing removal of the left tube and ovary from its infundibulopelvic ligament. Approximately 2 cm distal to the ovary. The right fallopian tube was then placed in an Endo Catch bag, which was placed through the right lower quadrant trocar and the right ovary removed from the operative field and marked with a suture. A similar procedure was carried out on the left ovary and 2. Copious irrigation was carried out and hemostasis was noted to be adequate. All instruments were removed. The peritoneal cavity. Under direct visualization. The Hassan cannula was removed and the sutures which had been used to hold it in place were used to create 2 figure-of-eight sutures of 0 Vicryl for fascial closure of the umbilical incision. The skin was closed with a subcuticular suture of 4-0 Monocryl. The right suprapubic incision was closed with a fascial suture of 0 Vicryl in a figure-of-eight fashion and Dermabond. The left suprapubic incision was closed with Dermabond.  d subcuticular sutures of 4-0 monocryl. The intrauterine manipulator was then removed as was the foley catheter  Instrument, sponge, and needle counts were correct prior to abdominal closure and at the conclusion of the case.   Findings: The anterior cul-de-sac and round ligaments no evidence of endometriosis The uterus contains several subserosal myoma to the specifically on the posterior uterus with the largest measuring approximately 4 cm and being pedunculated on the left posterior surface. There were 2 smaller subserosal fibroids to the right of midline of the posterior uterus. The adnexa . The right and left ovaries were densely adherent to the respective pelvic sidewalls with powder burn type lesions consistent with endometriosis. The right fallopian tube was within normal limits. The left fallopian tube was adherent to the overlying descending colon and to the left pelvic sidewall.  Cul-de-sac . No specific evidence of endometriosis was found in  the posterior cul-de-sac were the anterior cul-de-sac.  Estimated Blood Loss:  less than 100 mL         Drains: None         Specimens: Bilateral fallopian tubes and ovaries with a stitch on the right tube              Complications:  None; patient tolerated the procedure well.         Disposition: PACU - hemodynamically stable.  it is planned that the patient will be discharged from the postanesthesia care unit. Home when she has met appropriate parameters.         Condition: stable

## 2014-01-25 NOTE — Transfer of Care (Signed)
Immediate Anesthesia Transfer of Care Note  Patient: [name]  Procedure(s) Performed: Procedure(s): LAPAROSCOPIC BILATERAL SALPINGO OOPHORECTOMY (Bilateral) REMOVAL PORT-A-CATH (N/A)  Patient Location: PACU  Anesthesia Type:General  Level of Consciousness: awake, alert  and oriented  Airway & Oxygen Therapy: Patient Spontanous Breathing and Patient connected to nasal cannula oxygen  Post-op Assessment: Report given to PACU RN and Post -op Vital signs reviewed and stable  Post vital signs: Reviewed and stable  Complications: No apparent anesthesia complications

## 2014-01-25 NOTE — Anesthesia Procedure Notes (Signed)
Procedure Name: Intubation Date/Time: 01/25/2014 7:35 AM Performed by: Jonna Munro Pre-anesthesia Checklist: Patient identified, Emergency Drugs available, Suction available, Patient being monitored and Timeout performed Patient Re-evaluated:Patient Re-evaluated prior to inductionOxygen Delivery Method: Circle system utilized Preoxygenation: Pre-oxygenation with 100% oxygen Intubation Type: IV induction Ventilation: Mask ventilation without difficulty Laryngoscope Size: Mac and 3 Grade View: Grade I Tube type: Oral Tube size: 7.0 mm Number of attempts: 1 Airway Equipment and Method: Stylet Placement Confirmation: ETT inserted through vocal cords under direct vision,  positive ETCO2 and breath sounds checked- equal and bilateral Secured at: 20 cm Tube secured with: Tape Dental Injury: Teeth and Oropharynx as per pre-operative assessment

## 2014-01-25 NOTE — OR Nursing (Signed)
Family updated on surgical progress.

## 2014-01-25 NOTE — Op Note (Signed)
01/25/2014  8:30 AM  PATIENT:  [name], 38 y.o., female, MRN: [id number]  PREOP DIAGNOSIS:  Breast Cancer  POSTOP DIAGNOSIS:   Recurrent left breast cancer, completion of chemotherapy  PROCEDURE:   Procedure(s):  REMOVAL PORT-A-CATH,  LAPAROSCOPIC BILATERAL SALPINGO OOPHORECTOMY (Dr. Vassie Moment)  SURGEON:   Alphonsa Overall, M.D.  ANESTHESIA:   general  CRNA: Jonna Munro, CRNA  General  EBL:  Minimal  ml  LOCAL MEDICATIONS USED:   8 cc 1% xylocaine  SPECIMEN:   Port removed.  Did not need to go to path.  COUNTS CORRECT:  YES  INDICATIONS FOR PROCEDURE:  [name] is a 38 y.o. (DOB: 1975-06-02) AA  female whose primary care physician is POWELL,ELMIRA, PA-C and comes for removal of right IJ power port and bilateral oophorectomy.  I will remove the power port.  Dr. Leo Grosser will do the bilateral oophorectomy.   The indications and risks of the surgery were explained to the patient.  The risks include, but are not limited to, infection, bleeding, and nerve injury.  Procedure note:  The patient was taken to room #6 at [facility]. She underwent a general anesthesia.    A time out was held and the surgical check was run.   Right upper chest was prepped with chloroprep.   I made an incision over the port. It was removed without difficulty. The wound was irrigated. I closed the subcutaneous tissues with 3-0 Vicryl suture. Skin was closed with 5-0 Vicryl suture and painted with Dermabond. Following [facility] protocol they will cover the dressed wound.   Dr. Leo Grosser is a proceed with a bilateral oophorectomy.  She will arrange discharge.  Alphonsa Overall, MD, [facility] Surgery Pager: [phone] Office phone:  [phone]

## 2014-01-25 NOTE — OR Nursing (Signed)
Family notified of patient status and surgical progress.

## 2014-01-25 NOTE — Anesthesia Postprocedure Evaluation (Signed)
  Anesthesia Post-op Note  Anesthesia Post Note  Patient: [name]  Procedure(s) Performed: Procedure(s) (LRB): LAPAROSCOPIC BILATERAL SALPINGO OOPHORECTOMY (Bilateral) REMOVAL PORT-A-CATH (N/A)  Anesthesia type: General  Patient location: PACU  Post pain: Pain level controlled  Post assessment: Post-op Vital signs reviewed  Last Vitals:  Filed Vitals:   01/25/14 1500  BP: 129/72  Pulse: 111  Temp:   Resp: 18    Post vital signs: Reviewed  Level of consciousness: sedated  Complications: No apparent anesthesia complications

## 2014-01-26 ENCOUNTER — Encounter (HOSPITAL_COMMUNITY): Payer: Self-pay | Admitting: Obstetrics and Gynecology

## 2014-02-08 ENCOUNTER — Other Ambulatory Visit (HOSPITAL_BASED_OUTPATIENT_CLINIC_OR_DEPARTMENT_OTHER): Payer: 59

## 2014-02-08 DIAGNOSIS — C649 Malignant neoplasm of unspecified kidney, except renal pelvis: Secondary | ICD-10-CM

## 2014-02-08 DIAGNOSIS — C73 Malignant neoplasm of thyroid gland: Secondary | ICD-10-CM

## 2014-02-08 DIAGNOSIS — C773 Secondary and unspecified malignant neoplasm of axilla and upper limb lymph nodes: Principal | ICD-10-CM

## 2014-02-08 DIAGNOSIS — D649 Anemia, unspecified: Secondary | ICD-10-CM

## 2014-02-08 DIAGNOSIS — C50919 Malignant neoplasm of unspecified site of unspecified female breast: Secondary | ICD-10-CM

## 2014-02-08 LAB — CBC WITH DIFFERENTIAL/PLATELET
BASO%: 0.9 % (ref 0.0–2.0)
Basophils Absolute: 0 10*3/uL (ref 0.0–0.1)
EOS ABS: 0.1 10*3/uL (ref 0.0–0.5)
EOS%: 3.2 % (ref 0.0–7.0)
HCT: 37.4 % (ref 34.8–46.6)
HGB: 11.9 g/dL (ref 11.6–15.9)
LYMPH%: 37.4 % (ref 14.0–49.7)
MCH: 27.6 pg (ref 25.1–34.0)
MCHC: 31.9 g/dL (ref 31.5–36.0)
MCV: 86.6 fL (ref 79.5–101.0)
MONO#: 0.2 10*3/uL (ref 0.1–0.9)
MONO%: 6.3 % (ref 0.0–14.0)
NEUT%: 52.2 % (ref 38.4–76.8)
NEUTROS ABS: 2.1 10*3/uL (ref 1.5–6.5)
Platelets: 265 10*3/uL (ref 145–400)
RBC: 4.32 10*6/uL (ref 3.70–5.45)
RDW: 14.5 % (ref 11.2–14.5)
WBC: 4 10*3/uL (ref 3.9–10.3)
lymph#: 1.5 10*3/uL (ref 0.9–3.3)

## 2014-02-08 LAB — COMPREHENSIVE METABOLIC PANEL (CC13)
ALK PHOS: 105 U/L (ref 40–150)
ALT: 12 U/L (ref 0–55)
AST: 15 U/L (ref 5–34)
Albumin: 3.5 g/dL (ref 3.5–5.0)
Anion Gap: 9 mEq/L (ref 3–11)
BUN: 15.2 mg/dL (ref 7.0–26.0)
CALCIUM: 10.7 mg/dL — AB (ref 8.4–10.4)
CO2: 25 mEq/L (ref 22–29)
CREATININE: 0.9 mg/dL (ref 0.6–1.1)
Chloride: 109 mEq/L (ref 98–109)
Glucose: 117 mg/dl (ref 70–140)
Potassium: 3.6 mEq/L (ref 3.5–5.1)
Sodium: 143 mEq/L (ref 136–145)
Total Bilirubin: 0.26 mg/dL (ref 0.20–1.20)
Total Protein: 7.5 g/dL (ref 6.4–8.3)

## 2014-02-13 ENCOUNTER — Telehealth: Payer: Self-pay | Admitting: *Deleted

## 2014-02-13 NOTE — Telephone Encounter (Signed)
Patient called to reschedule her appt for 10/28. Patient going out of town for 3 weeks. Spoke with Dr. Jana Hakim. Ok to reschedule f/u in November. In basket sent to [facility] and patient also called. Patient verbalized understanding.

## 2014-02-14 ENCOUNTER — Telehealth: Payer: Self-pay | Admitting: Oncology

## 2014-02-14 NOTE — Telephone Encounter (Signed)
per pof to sch pt appt-sch cld & left message mailed copy of sch

## 2014-02-22 ENCOUNTER — Ambulatory Visit: Payer: 59 | Admitting: Oncology

## 2014-02-27 ENCOUNTER — Encounter: Payer: Self-pay | Admitting: Nurse Practitioner

## 2014-02-27 ENCOUNTER — Telehealth: Payer: Self-pay | Admitting: Nurse Practitioner

## 2014-02-27 ENCOUNTER — Other Ambulatory Visit (HOSPITAL_BASED_OUTPATIENT_CLINIC_OR_DEPARTMENT_OTHER): Payer: 59

## 2014-02-27 ENCOUNTER — Ambulatory Visit (HOSPITAL_BASED_OUTPATIENT_CLINIC_OR_DEPARTMENT_OTHER): Payer: 59 | Admitting: Nurse Practitioner

## 2014-02-27 VITALS — BP 138/83 | HR 86 | Temp 98.2°F | Resp 20 | Ht 67.0 in | Wt 212.9 lb

## 2014-02-27 DIAGNOSIS — F329 Major depressive disorder, single episode, unspecified: Secondary | ICD-10-CM

## 2014-02-27 DIAGNOSIS — C50912 Malignant neoplasm of unspecified site of left female breast: Secondary | ICD-10-CM

## 2014-02-27 DIAGNOSIS — I890 Lymphedema, not elsewhere classified: Secondary | ICD-10-CM

## 2014-02-27 DIAGNOSIS — D649 Anemia, unspecified: Secondary | ICD-10-CM

## 2014-02-27 DIAGNOSIS — Z170 Estrogen receptor positive status [ER+]: Secondary | ICD-10-CM

## 2014-02-27 DIAGNOSIS — C773 Secondary and unspecified malignant neoplasm of axilla and upper limb lymph nodes: Secondary | ICD-10-CM

## 2014-02-27 DIAGNOSIS — C50919 Malignant neoplasm of unspecified site of unspecified female breast: Secondary | ICD-10-CM

## 2014-02-27 LAB — CBC WITH DIFFERENTIAL/PLATELET
BASO%: 0.8 % (ref 0.0–2.0)
Basophils Absolute: 0 10*3/uL (ref 0.0–0.1)
EOS ABS: 0.1 10*3/uL (ref 0.0–0.5)
EOS%: 1.7 % (ref 0.0–7.0)
HEMATOCRIT: 37.3 % (ref 34.8–46.6)
HEMOGLOBIN: 11.7 g/dL (ref 11.6–15.9)
LYMPH%: 41.8 % (ref 14.0–49.7)
MCH: 27.1 pg (ref 25.1–34.0)
MCHC: 31.3 g/dL — ABNORMAL LOW (ref 31.5–36.0)
MCV: 86.6 fL (ref 79.5–101.0)
MONO#: 0.4 10*3/uL (ref 0.1–0.9)
MONO%: 7.5 % (ref 0.0–14.0)
NEUT%: 48.2 % (ref 38.4–76.8)
NEUTROS ABS: 2.3 10*3/uL (ref 1.5–6.5)
PLATELETS: 250 10*3/uL (ref 145–400)
RBC: 4.31 10*6/uL (ref 3.70–5.45)
RDW: 14.7 % — AB (ref 11.2–14.5)
WBC: 4.8 10*3/uL (ref 3.9–10.3)
lymph#: 2 10*3/uL (ref 0.9–3.3)

## 2014-02-27 LAB — COMPREHENSIVE METABOLIC PANEL (CC13)
ALBUMIN: 3.7 g/dL (ref 3.5–5.0)
ALK PHOS: 96 U/L (ref 40–150)
ALT: 14 U/L (ref 0–55)
AST: 14 U/L (ref 5–34)
Anion Gap: 7 mEq/L (ref 3–11)
BILIRUBIN TOTAL: 0.25 mg/dL (ref 0.20–1.20)
BUN: 17.5 mg/dL (ref 7.0–26.0)
CO2: 27 mEq/L (ref 22–29)
CREATININE: 0.9 mg/dL (ref 0.6–1.1)
Calcium: 10.9 mg/dL — ABNORMAL HIGH (ref 8.4–10.4)
Chloride: 108 mEq/L (ref 98–109)
Glucose: 102 mg/dl (ref 70–140)
POTASSIUM: 4.2 meq/L (ref 3.5–5.1)
Sodium: 142 mEq/L (ref 136–145)
Total Protein: 7.5 g/dL (ref 6.4–8.3)

## 2014-02-27 MED ORDER — TAMOXIFEN CITRATE 20 MG PO TABS: 20.0000 mg | ORAL_TABLET | Freq: Every day | ORAL | Status: DC

## 2014-02-27 NOTE — Telephone Encounter (Signed)
per pof to sch pt appt-gave pt copy of sch °

## 2014-02-27 NOTE — Progress Notes (Signed)
[facility]  Telephone:([phone]) [phone] Fax:([phone]) [phone]     ID: [name] OB: 08-06-1975  MR#: [id number]  FBP#:[id number]  PCP: [name] GYN:  [name] SU: [name] OTHER MD: [name] [phone])  CHIEF COMPLAINT:  Locally recurrent estrogen receptor positive breast cancer CURRENT TREATMENT: to restart anti-estrogen therapy  BREAST CANCER HISTORY:  From the original intake note:  [name] had bilateral diagnostic mammography [facility] in [place] 11/30/2009 showing a 1.5 cm mass at the 9:00 position of the left breast with some satellites. A biopsy of the mass in question [facility] showed ([id number]) and invasive lobular carcinoma, which was estrogen and progesterone receptor both 100% positive, both with strong staining intensity, but HER-2 negative at 1+. Bilateral breast MRI 12/13/2009 in [place] showed in the left breast an irregularly marginated mass measuring 3.2 cm. There were 4 or 5 separate satellite lesions laterally and superior to the primary.  Accordingly, after appropriate discussion, the patient underwent bilateral mastectomies with left sentinel lymph node sampling 01/02/2010. The right breast was benign. The left breast showed a 2.3 cm invasive lobular carcinoma, grade 3, with a total of 6 sentinel lymph nodes removed, all negative, although 2 showed isolated tumor cells by immunostaining. Margins were negative.  An Oncotype BX was sent, with a recurrence score of 22, predicting a risk of distant recurrence within 10 years with 14% of the patients only systemic treatment was tamoxifen. The patient was treated with CMF chemotherapy between 02/22/2010 and 07/05/2010, receiving 7 cycles at which point the patient refused further chemotherapy though there have been no major toxicities at according to the oncology note from [name]. The patient was then started on  tamoxifen 08/09/2010. By her cancer took approximately 12-15 months then stopped because of hot flashes and insomnia problems.  More recently, the patient noted a change in her left axilla andunderwent bilateral breast MRI January to 2015 at [place]. The patient has bilateral submuscular silicone implants in place. The breast were unremarkable, but there were several lymph nodes with cortical thickening in the left axilla including a dominant 1 measuring 1.2 cm in the short axis. Ultrasonography of this area identified the lymph node in question and the patient underwent biopsy of the left axillary lymph node This showed ([id number]) near-total replacement of the lymph node by metastatic carcinoma. This was 96% estrogen receptor positive, and 84% progesterone receptor positive, both with strong staining intensity. The MIB-1 was 20%. HER-2 determination is pending.  The patient's subsequent history is as detailed below  INTERVAL HISTORY: [name] returns today for follow up of her breast cancer. She had a bilateral salpingo-oophorectomy performed by [name] on 01/25/14 and is here on clinic to restart her anti-estrogen therapy. [name] had previously recommended she begin anastrozole as she is post menopausal now, but the patient has been on tamoxifen previously and tolerated it well, so she prefers to restart this drug.  REVIEW OF SYSTEMS: [name] denies fevers, chills, nausea, vomiting, or change in bowel or bladder habits. She has hot flashes nearly every day, but she would not categorize them as particularly disruptive. She has no shortness of breath, chest pain, cough, palpitations, or fatigue. She exercises regularly, incorporating both cardio and weight training. She eats well and keeps well hydrated. A detailed review of systems is otherwise noncontributory.    PAST MEDICAL HISTORY: Past Medical History  Diagnosis Date  . Cancer     breast ca  .  Malignant neoplasm of breast  (female), unspecified site   . Radiation 10/24/13-12/09/13    Left chestwall/supraclav./scar  . GERD (gastroesophageal reflux disease)   . Back muscle spasm     occasional tx with skelaxin    PAST SURGICAL HISTORY: Past Surgical History  Procedure Laterality Date  . Bilateral total mastectomy with axillary lymph node dissection    . Tonsillectomy    . Leep    . Portacath placement N/A 06/16/2013    Procedure: POWER PORT PLACEMENT;  Surgeon: Stacy Medal, MD;  Location: [facility];  Service: Bell;  Laterality: N/A;  . Axillary lymph node dissection Left 06/16/2013    Procedure: LEFT AXILLARY NODE DISSECTION;  Surgeon: Stacy Medal, MD;  Location: [facility];  Service: Bell;  Laterality: Left;  . Breast surgery    . Wisdom tooth extraction    . Right leg surgery      broken femur - has rod in leg  . Laparoscopic bilateral salpingo oopherectomy Bilateral 01/25/2014    Procedure: LAPAROSCOPIC BILATERAL SALPINGO OOPHORECTOMY;  Surgeon: Stacy Manges, MD;  Location: [facility];  Service: Gynecology;  Laterality: Bilateral;  . Port-a-cath removal N/A 01/25/2014    Procedure: REMOVAL PORT-A-CATH;  Surgeon: Stacy Overall, MD;  Location: [facility];  Service: Bell;  Laterality: N/A;    FAMILY HISTORY Family History  Problem Relation Age of Onset  . Other Mother 90    glioblastoma; deceased 87  . Other Father 59    glioblastoma; deceased 85   according to the patient both her parents died from a primary brain cancers, namely we have the stomas, her father at 46, her mother at 38. The patient had one brother and one sister. There is no history of breast or ovarian cancer in the family  GYNECOLOGIC HISTORY:   (Reviewed 10/10/2013) Menarche age 73. The patient has never carried a child to term. She was still having regular periods at the time of her recent diagnosis. LMP 07/18/2013 as of 09/08/2013.  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 She shares a home with her significant other, [name], who works at installing cisterns. They have no pets.    ADVANCED DIRECTIVES: Not in place   HEALTH MAINTENANCE:  (Reviewed 10/10/2013) History  Substance Use Topics  . Smoking status: Never Smoker   . Smokeless tobacco: Never Used  . Alcohol Use: Yes     Comment: socially wine     Colonoscopy: Never  PAP: December 2014  Bone density: Never  Lipid panel: Not on file   Allergies  Allergen Reactions  . Sulfa Antibiotics Hives and Itching  . Gadolinium Derivatives Hives    After gado injection, pt had a few small hives on left breast area. Treated with benadryl by dr Janeece Fitting   . Petrolatum Hives and Rash    Current Outpatient Prescriptions  Medication Sig Dispense Refill  . metaxalone (SKELAXIN) 800 MG tablet Take 1 tablet (800 mg total) by mouth 3 (three) times daily as needed for muscle spasms. 60 tablet 0  . omeprazole (PRILOSEC) 40 MG capsule Take 1 capsule (40 mg total) by mouth daily. 90 capsule 3  . ibuprofen (ADVIL,MOTRIN) 600 MG tablet 1  po  pc every 6 hours for 3 days then prn-pain 30 tablet 1  .  oxyCODONE-acetaminophen (PERCOCET/ROXICET) 5-325 MG per tablet Take 1 tablet by mouth every 4 (four) hours as needed. 30 tablet 0  . tamoxifen (NOLVADEX) 20 MG tablet Take 1 tablet (20 mg total) by mouth daily. 30 tablet 4  . VENTOLIN HFA 108 (90 BASE) MCG/ACT inhaler      No current facility-administered medications for this visit.    OBJECTIVE: Young [place] American woman who appears  tired but is in no acute distress Filed Vitals:   02/27/14 1048  BP: 138/83  Pulse: 86  Temp: 98.2 F (36.8 C)  Resp: 20     Body mass index is 33.34 kg/(m^2).    ECOG FS:1 - Symptomatic but completely ambulatory Filed Weights   02/27/14 1048  Weight: 212 lb  14.4 oz (96.571 kg)   Skin: warm, dry  HEENT: sclerae anicteric, conjunctivae pink, oropharynx clear. No thrush or mucositis.  Lymph Nodes: No cervical or supraclavicular lymphadenopathy  Lungs: clear to auscultation bilaterally, no rales, wheezes, or rhonci  Heart: regular rate and rhythm  Abdomen: round, soft, non tender, positive bowel sounds  Musculoskeletal: No focal spinal tenderness, no peripheral edema  Neuro: non focal, well oriented, positive affect  Breasts: bilateral breasts status post mastectomies, radiation, and reconstruction. No evidence of local recurrence. Bilateral axillae benign.    LAB RESULTS:   Lab Results  Component Value Date   WBC 4.8 02/27/2014   NEUTROABS 2.3 02/27/2014   HGB 11.7 02/27/2014   HCT 37.3 02/27/2014   MCV 86.6 02/27/2014   PLT 250 02/27/2014      Chemistry      Component Value Date/Time   NA 142 02/27/2014 1035   NA 135* 07/16/2013 0535   K 4.2 02/27/2014 1035   K 4.4 07/16/2013 0535   CL 97 07/16/2013 0535   CO2 27 02/27/2014 1035   CO2 25 07/16/2013 0535   BUN 17.5 02/27/2014 1035   BUN 11 07/16/2013 0535   CREATININE 0.9 02/27/2014 1035   CREATININE 0.81 07/16/2013 0535      Component Value Date/Time   CALCIUM 10.9* 02/27/2014 1035   CALCIUM 10.1 07/16/2013 0535   ALKPHOS 96 02/27/2014 1035   ALKPHOS 92 07/16/2013 0535   AST 14 02/27/2014 1035   AST 16 07/16/2013 0535   ALT 14 02/27/2014 1035   ALT 14 07/16/2013 0535   BILITOT 0.25 02/27/2014 1035   BILITOT 0.9 07/16/2013 0535      STUDIES: No results found.  ASSESSMENT: 38 y.o. [place] woman  (1) status post bilateral mastectomies with left axillary lymph node sampling [6 nodes removed] 01/02/2010 for a pT2 pN0(i+), stage IIA invasive lobular breast cancer, grade 3, estrogen and progesterone receptor positive, HER-2 not amplified; right breast was benign  (2) Oncotype DX score of 22 predicted a 14% risk of distant recurrence within 10 years if the  patient's only systemic treatment is tamoxifen for 5 years  (3) status post CMF(cyclophosphamide, fluorouracil, methotrexate) x7 given between October of 2011 and March of 2012 (7 of 8 planned treatments completed)  (4) tamoxifen started April 2012, discontinued approximately July of 2013 (about 15 months) because of problems with hot flashes and insomnia  (5) pathologically documented left axillary recurrence 05/05/2013, the tumor being again estrogen and progesterone receptor positive, with HER-2 not amplified, and MIB-1 of 15%. Staging CT/PET 05/27/2013 show left axillary and supraclavicular recurrence, no distant disease  (6) status post completion left axillary lymph node dissection 06/16/2013 showing a total of 10/15 lymph nodes involved by tumor, with evidence  of extracapsular extension (TX N3 = stage IIIC)  (7) treated with carboplatin/ docetaxel, first dose on 07/11/2013, repeated every 21 days x 4 with Neulasta support on day 2.  Final dose was given on 09/12/2013, with chemotherapy discontinued after 4 cycles due to continuing low counts.  (8) radiation to be completed 12/09/2013  (9) bilateral salpingo-oophorectomy on 01/25/14  (10)  depression, being treated with Effexor XR plus counseling - improved   (11) GI symptoms, consistent with IBS - improved  (12)  pitting edema bilaterally in the lower extremities - secondary to Taxotere/dexamethasone- improved  (13) lymphedema, pain, and limited range of motion in the left upper extremity secondary to left axillary lymph node dissection, being treated through the lymphedema clinic  - improved    PLAN: Marianela looks and feels well today. The labs were reviewed in detail and were stable. We spent approximately 20 minutes discussing antiestrogen therapy as well as the potential side effects of each drug. For tamoxifen that includes vaginal wetness, hot flashes, and increased risk for blood clots. As far as length of treatment, we  discussed that both 10 years of tamoxifen alone as well as 2 years of tamoxifen in combination with 3 years of an aromatase inhibitor were both proven to be better than 5 years of tamoxifen alone. At this time Roben is leaning towards 10 years of treatment, but is open it addressing it further at her 2 year treatment mark.   Nyasiah will return in 3 months for labs and an office visit to evaluate her tolerance on this drug. She understands and agrees with this plan. She knows the goal of treatment in her case is cure. She has been encouraged to call with any issues that might arise before her next visit here.    Marcelino Duster, NP   02/27/2014 11:13 AM

## 2014-03-08 ENCOUNTER — Encounter: Payer: Self-pay | Admitting: Oncology

## 2014-03-08 ENCOUNTER — Encounter: Payer: Self-pay | Admitting: *Deleted

## 2014-03-08 ENCOUNTER — Other Ambulatory Visit: Payer: Self-pay | Admitting: *Deleted

## 2014-03-08 DIAGNOSIS — C773 Secondary and unspecified malignant neoplasm of axilla and upper limb lymph nodes: Principal | ICD-10-CM

## 2014-03-08 DIAGNOSIS — M79622 Pain in left upper arm: Secondary | ICD-10-CM

## 2014-03-08 DIAGNOSIS — C50919 Malignant neoplasm of unspecified site of unspecified female breast: Secondary | ICD-10-CM

## 2014-03-10 ENCOUNTER — Other Ambulatory Visit: Payer: Self-pay | Admitting: *Deleted

## 2014-03-10 DIAGNOSIS — I890 Lymphedema, not elsewhere classified: Secondary | ICD-10-CM

## 2014-03-10 DIAGNOSIS — C773 Secondary and unspecified malignant neoplasm of axilla and upper limb lymph nodes: Principal | ICD-10-CM

## 2014-03-10 DIAGNOSIS — C50919 Malignant neoplasm of unspecified site of unspecified female breast: Secondary | ICD-10-CM

## 2014-03-21 ENCOUNTER — Ambulatory Visit: Payer: 59 | Attending: Physical Therapy | Admitting: Physical Therapy

## 2014-03-28 ENCOUNTER — Ambulatory Visit: Payer: 59 | Attending: Oncology | Admitting: Physical Therapy

## 2014-03-28 ENCOUNTER — Other Ambulatory Visit: Payer: 59

## 2014-03-28 ENCOUNTER — Ambulatory Visit: Payer: 59 | Admitting: Oncology

## 2014-03-28 DIAGNOSIS — E8989 Other postprocedural endocrine and metabolic complications and disorders: Secondary | ICD-10-CM | POA: Diagnosis present

## 2014-03-28 DIAGNOSIS — I890 Lymphedema, not elsewhere classified: Secondary | ICD-10-CM | POA: Diagnosis present

## 2014-03-28 NOTE — Therapy (Signed)
[facility] [place], [place], [address] Phone: [phone]   Fax:  [phone]  Physical Therapy Evaluation  Patient Details  Name: [name] MRN: [id number] Date of Birth: 17-Feb-1976  Encounter Date: 03/28/2014      PT End of Session - 03/28/14 1838    Visit Number 1   Number of Visits 8   Date for PT Re-Evaluation 04/25/14   PT Start Time 5625   PT Stop Time 1431   PT Time Calculation (min) 53 min   Activity Tolerance Patient tolerated treatment well   Behavior During Therapy [facility] for tasks assessed/performed      Past Medical History  Diagnosis Date  . Cancer     breast ca  . Malignant neoplasm of breast (female), unspecified site   . Radiation 10/24/13-12/09/13    Left chestwall/supraclav./scar  . GERD (gastroesophageal reflux disease)   . Back muscle spasm     occasional tx with skelaxin    Past Surgical History  Procedure Laterality Date  . Bilateral total mastectomy with axillary lymph node dissection    . Tonsillectomy    . Leep    . Portacath placement N/A 06/16/2013    Procedure: POWER PORT PLACEMENT;  Surgeon: Shann Medal, MD;  Location: [facility];  Service: General;  Laterality: N/A;  . Axillary lymph node dissection Left 06/16/2013    Procedure: LEFT AXILLARY NODE DISSECTION;  Surgeon: Shann Medal, MD;  Location: [facility];  Service: General;  Laterality: Left;  . Breast surgery    . Wisdom tooth extraction    . Right leg surgery      broken femur - has rod in leg  . Laparoscopic bilateral salpingo oopherectomy Bilateral 01/25/2014    Procedure: LAPAROSCOPIC BILATERAL SALPINGO OOPHORECTOMY;  Surgeon: Eldred Manges, MD;  Location: [facility];  Service: Gynecology;  Laterality: Bilateral;  . Port-a-cath removal N/A 01/25/2014    Procedure: REMOVAL PORT-A-CATH;  Surgeon: Alphonsa Overall, MD;  Location: [facility];  Service: General;  Laterality: N/A;    There were no vitals taken for this visit.  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 No/denies          [facility] PT Assessment - 03/28/14 0001    Assessment   Medical Diagnosis right breast cancer   Onset Date 05/12/13   Precautions   Precautions Other (comment)   Precaution Comments --  cancer precautions   Restrictions   Weight Bearing Restrictions No   Balance Screen   Has the patient fallen in the past 6 months No   Has the patient had a decrease in activity level because of a fear of falling?  No   Is the patient reluctant to leave their home because of a fear of falling?  No   Home Environment   Living Enviornment Private residence   Living Arrangements Spouse/significant other   Available Help at Discharge Available PRN/intermittently   Type of [place] to enter   Entrance Stairs-Number of Steps 4   Entrance Stairs-Rails [place] One level   [place] None  Prior Function   Level of Independence Independent with basic ADLs;Independent with homemaking with ambulation;Independent with gait;Independent with transfers   Time Unemployed   Leisure goes to the gym 4 days a week does cardio and weight training machines   Cognition   Overall Cognitive Status Within Functional Limits for tasks assessed   Posture/Postural Control   Posture/Postural Control No significant limitations   AROM   Right Shoulder Extension 64 Degrees   Right Shoulder Flexion 127 Degrees   Right Shoulder ABduction 128 Degrees   Right Shoulder Internal Rotation 55 Degrees   Right Shoulder External Rotation 84 Degrees   Left Shoulder Flexion 160 Degrees   Left Shoulder ABduction  162 Degrees   Left Shoulder Internal Rotation 60 Degrees   Left Shoulder External Rotation 90 Degrees       Treatment Manual lymph drainage in supine as follows: short neck, right axillary nodes, left inguinal nodes, superficial and deep abdominals; anterior inter-axillary anastamoses, left axillo-inguinal anastamoses. Left upper extremity from fingers and dorsal hand to lateral shoulder redirecting along pathways.          Plan - 03/28/14 1839    Clinical Impression Statement Ms [name] presents with a lymphedema flare up.  She will benefit from manual lymph drainage to help with movement of fluid, use of her day and nighttime compression garments and instruction on strength ABC strengthening program to add to her fitness routine at [place] will benefit from skilled therapeutic intervention in order to improve on the following deficits Increased edema;Decreased strength;Decreased endurance   Rehab Potential Good   Clinical Impairments Affecting Rehab Potential axillary lymph node dissection and past radiation treatment   PT Frequency 2x / week   PT Duration 4 weeks   PT Treatment/Interventions Therapeutic exercise;Scar mobilization;Patient/family education;Manual techniques;Manual lymph drainage   PT Next Visit Plan manual lymph drainage instruct in lymphedema remedial exercise   Consulted and Agree with Plan of Care Patient              LYMPHEDEMA/ONCOLOGY QUESTIONNAIRE - 03/28/14 1359    Left Upper Extremity Lymphedema   15 cm Proximal to Olecranon Process 35.5 cm   10 cm Proximal to Olecranon Process 30.9 cm   Olecranon Process 28.8 cm   15 cm Proximal to Ulnar Styloid Process 30.1 cm   10 cm Proximal to Ulnar Styloid Process 26.3 cm   Just Proximal to Ulnar Styloid Process 20.5 cm   Across Hand at [employer] 20.4 cm   At [place] of 2nd Digit 6.5 cm              Short Term Clinic Goals - 03/28/14 1842    CC Short Term Goal  #1   Title short  term goals=long term goals         Long Term Clinic Goals - 03/28/14 1842    CC Long Term Goal  #1   Title pt have reduction of circumference of left arm at 10 cm proximal to the ulnar styolid of .5 cm   Time 4   Period Weeks   Status New   CC Long Term Goal  #2   Title pt will verbalize understanding of strength ABC program    Time 4   Period Weeks   Status New   CC Long Term Goal  #3   Title pt will state a 25 % improvement in symptoms.   Time 4   Period Weeks   Status New  Problem List Patient Active Problem List   Diagnosis Date Noted  . Endometriosis of ovary 01/25/2014  . Fibroids, subserous 01/25/2014  . Lymphedema of arm 01/09/2014  . Malignant neoplasm of female breast   . Chemotherapy induced neutropenia 10/10/2013  . Lower back pain 10/10/2013  . Bilateral lower extremity edema 09/08/2013  . Anxiety 09/08/2013  . Depression 08/10/2013  . Anemia, unspecified 07/20/2013  . Pain in left axilla 07/06/2013  . 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 Owens Shark, PT  Norwood Levo 03/28/2014, 6:45 PM

## 2014-04-05 ENCOUNTER — Ambulatory Visit: Payer: 59 | Admitting: Physical Therapy

## 2014-04-05 DIAGNOSIS — M25612 Stiffness of left shoulder, not elsewhere classified: Secondary | ICD-10-CM

## 2014-04-05 DIAGNOSIS — E8989 Other postprocedural endocrine and metabolic complications and disorders: Secondary | ICD-10-CM

## 2014-04-05 DIAGNOSIS — I890 Lymphedema, not elsewhere classified: Principal | ICD-10-CM

## 2014-04-05 NOTE — Therapy (Signed)
[place], [place], [address] Phone: [phone]   Fax:  [phone]  Physical Therapy Treatment  Patient Details  Name: [name] MRN: [id number] Date of Birth: [birth year]  Encounter Date: 04/05/2014      PT End of Session - 04/05/14 1446    Visit Number 2   Number of Visits 8   Date for PT Re-Evaluation 04/25/14   PT Start Time 1115   PT Stop Time 1157   PT Time Calculation (min) 42 min   Activity Tolerance Patient limited by pain      Past Medical History  Diagnosis Date  . Cancer     breast ca  . Malignant neoplasm of breast (female), unspecified site   . Radiation 10/24/13-12/09/13    Left chestwall/supraclav./scar  . GERD (gastroesophageal reflux disease)   . Back muscle spasm     occasional tx with skelaxin    Past Surgical History  Procedure Laterality Date  . Bilateral total mastectomy with axillary lymph node dissection    . Tonsillectomy    . Leep    . Portacath placement N/A 06/16/2013    Procedure: POWER PORT PLACEMENT;  Surgeon: Shann Medal, MD;  Location: [facility];  Service: General;  Laterality: N/A;  . Axillary lymph node dissection Left 06/16/2013    Procedure: LEFT AXILLARY NODE DISSECTION;  Surgeon: Shann Medal, MD;  Location: [facility];  Service: General;  Laterality: Left;  . Breast surgery    . Wisdom tooth extraction    . Right leg surgery      broken femur - has rod in leg  . Laparoscopic bilateral salpingo oopherectomy Bilateral 01/25/2014    Procedure: LAPAROSCOPIC BILATERAL SALPINGO OOPHORECTOMY;  Surgeon: Eldred Manges, MD;  Location: [facility];  Service: Gynecology;  Laterality: Bilateral;  . Port-a-cath removal N/A 01/25/2014    Procedure: REMOVAL PORT-A-CATH;  Surgeon: Alphonsa Overall, MD;  Location: [facility];  Service: General;  Laterality: N/A;    There were no vitals taken for this visit.  Visit Diagnosis:  Lymphedema of upper extremity following  lymphadenectomy  Shoulder joint stiffness, left      Subjective Assessment - 04/05/14 1123    Symptoms Did note some improvement after initial manual lymph drainage; has been wearing sleeve; arm is still swollen.  Needs review--feels she doesn't do self-manual lymph drainage correctly.   Currently in Pain? No/denies            [facility] Adult PT Treatment/Exercise - 04/05/14 0001    Manual Therapy   Manual Therapy (p) Myofascial release;Manual Lymphatic Drainage (MLD);Passive ROM   Myofascial Release (p) Left UE myofascial pulling.   Passive ROM (p) Left shoulder PROM with stretch to tolerance with focus on abduction and flexion, which remain limited.       Manual lymph drainage in supine as follows: short neck, right axillary nodes, left inguinal nodes, superficial and deep abdominals; anterior inter-axillary anastamoses, left axillo-inguinal anastamoses. Left upper extremity from fingers and dorsal hand to lateral shoulder redirecting along pathways.         Plan - 04/05/14 1447    Clinical Impression Statement Still with limited left shoulder ROM and pain with stretching; discussed that she should see this improve, though she may always feel it is not as loose as prior to treatment.  Left UE feels indurated to the touch today.  Encouraged patient to wear nighttime sleeve as much as possible and daytime sleeve daily, all day.   PT  Next Visit Plan remeasure left arm; continue ROM and manual lymph drainage; check shoulder HEP                               Problem List Patient Active Problem List   Diagnosis Date Noted  . Endometriosis of ovary 01/25/2014  . Fibroids, subserous 01/25/2014  . Lymphedema of arm 01/09/2014  . Malignant neoplasm of female breast   . Chemotherapy induced neutropenia 10/10/2013  . Lower back pain 10/10/2013  . Bilateral lower extremity edema 09/08/2013  . Anxiety 09/08/2013  . Depression 08/10/2013  . Anemia, unspecified  07/20/2013  . Pain in left axilla 07/06/2013  . Breast cancer metastasized to axillary lymph node-left 05/11/2013    Khris Jansson 04/05/2014, 2:51 PM  Glyn Zendejas, PT

## 2014-04-10 ENCOUNTER — Telehealth: Payer: Self-pay | Admitting: Physical Therapy

## 2014-04-10 NOTE — Telephone Encounter (Signed)
Pt called today and cancelled both appt this week, offered her appt for next for replatements, pt declined , she will call  to schedule appts

## 2014-04-11 ENCOUNTER — Ambulatory Visit: Payer: 59

## 2014-04-12 ENCOUNTER — Ambulatory Visit (HOSPITAL_COMMUNITY)
Admission: RE | Admit: 2014-04-12 | Discharge: 2014-04-12 | Disposition: A | Discharge status: Home / Self Care | Payer: 59 | Source: Ambulatory Visit | Attending: Oncology | Admitting: Oncology

## 2014-04-12 DIAGNOSIS — C773 Secondary and unspecified malignant neoplasm of axilla and upper limb lymph nodes: Secondary | ICD-10-CM | POA: Insufficient documentation

## 2014-04-12 DIAGNOSIS — C50919 Malignant neoplasm of unspecified site of unspecified female breast: Secondary | ICD-10-CM

## 2014-04-12 DIAGNOSIS — M79602 Pain in left arm: Secondary | ICD-10-CM | POA: Insufficient documentation

## 2014-04-12 DIAGNOSIS — M79622 Pain in left upper arm: Secondary | ICD-10-CM

## 2014-04-12 LAB — GLUCOSE, CAPILLARY: Glucose-Capillary: 99 mg/dL (ref 70–99)

## 2014-04-12 MED ORDER — FLUDEOXYGLUCOSE F - 18 (FDG) INJECTION
10.0000 | Freq: Once | INTRAVENOUS | Status: AC | PRN
  Administered 2014-04-12: 10 via INTRAVENOUS

## 2014-04-13 ENCOUNTER — Ambulatory Visit: Payer: 59

## 2014-04-27 ENCOUNTER — Telehealth: Payer: Self-pay | Admitting: *Deleted

## 2014-04-27 NOTE — Telephone Encounter (Signed)
Received call from pt. Pt requested a copy of last letter written for medical disability. Mailed a copy of letter to pt.

## 2014-05-11 ENCOUNTER — Ambulatory Visit: Payer: 59 | Attending: Oncology | Admitting: Physical Therapy

## 2014-05-11 DIAGNOSIS — I890 Lymphedema, not elsewhere classified: Secondary | ICD-10-CM | POA: Insufficient documentation

## 2014-05-11 DIAGNOSIS — E8989 Other postprocedural endocrine and metabolic complications and disorders: Secondary | ICD-10-CM

## 2014-05-11 NOTE — Patient Instructions (Signed)
Wrist , hand and finger range of motion while wearing day and night time compression garment

## 2014-05-11 NOTE — Therapy (Signed)
[place], [place], [address] Phone: [phone]   Fax:  [phone]  Physical Therapy Treatment  Patient Details  Name: [name] MRN: [id number] Date of Birth: Mar 31, 1976 Referring Provider:  Earnstine Regal, PA-C  Encounter Date: 05/11/2014      PT End of Session - 05/11/14 1647    Visit Number 3   Number of Visits 8   Date for PT Re-Evaluation 06/08/14      Past Medical History  Diagnosis Date  . Cancer     breast ca  . Malignant neoplasm of breast (female), unspecified site   . Radiation 10/24/13-12/09/13    Left chestwall/supraclav./scar  . GERD (gastroesophageal reflux disease)   . Back muscle spasm     occasional tx with skelaxin    Past Surgical History  Procedure Laterality Date  . Bilateral total mastectomy with axillary lymph node dissection    . Tonsillectomy    . Leep    . Portacath placement N/A 06/16/2013    Procedure: POWER PORT PLACEMENT;  Surgeon: Stacy Medal, MD;  Location: [facility];  Service: General;  Laterality: N/A;  . Axillary lymph node dissection Left 06/16/2013    Procedure: LEFT AXILLARY NODE DISSECTION;  Surgeon: Stacy Medal, MD;  Location: [facility];  Service: General;  Laterality: Left;  . Breast surgery    . Wisdom tooth extraction    . Right leg surgery      broken femur - has rod in leg  . Laparoscopic bilateral salpingo oopherectomy Bilateral 01/25/2014    Procedure: LAPAROSCOPIC BILATERAL SALPINGO OOPHORECTOMY;  Surgeon: Stacy Manges, MD;  Location: [facility];  Service: Gynecology;  Laterality: Bilateral;  . Port-a-cath removal N/A 01/25/2014    Procedure: REMOVAL PORT-A-CATH;  Surgeon: Stacy Overall, MD;  Location: [facility];  Service: General;  Laterality: N/A;    There were no vitals taken for this visit.  Visit Diagnosis:  Lymphedema of upper extremity following lymphadenectomy      Subjective Assessment - 05/11/14 1346    Symptoms Arm is  still swollen.  Wears daytime sleeve a little bit every day...not gauntlet or glove, Has a [employer] but only wears it twice a week because it is to hot.  Goes to [employer] 4 times a week mostly doing cardio, but does lat pull down machine 25 pounds. does not do free weights all the time   Patient Stated Goals to get thet fluid stuff out of there   Currently in Pain? No/denies     Manual lymph drainage in supine as follows: short neck, right axillary nodes, left inguinal nodes, superficial and deep abdominals; anterior inter-axillary anastamoses, left axillo-inguinal anastamoses. Left upper extremity from fingers and dorsal hand to lateral shoulder redirecting along pathways.      [facility] PT Assessment - 05/11/14 1349    AROM   Left Shoulder Flexion 135 Degrees   Left Shoulder ABduction 144 Degrees   Left Shoulder Internal Rotation 60 Degrees   Left Shoulder External Rotation 84 Degrees           LYMPHEDEMA/ONCOLOGY QUESTIONNAIRE - 05/11/14 1357    Left Upper Extremity Lymphedema   15 cm Proximal to Olecranon Process 33.4 cm   10 cm Proximal to Olecranon Process 31.6 cm   Olecranon Process 27.5 cm   15 cm Proximal to Ulnar Styloid Process 29.2 cm   10 cm Proximal to Ulnar Styloid Process 26.5 cm   Just Proximal to Ulnar Styloid Process  19.5 cm   Across Hand at [employer] 19.5 cm   At [place] of 2nd Digit 6.5 cm             PT Education - 05/11/14 1642    Education provided Yes   Education Details range of motion   Person(s) Educated Patient   Methods Explanation;Demonstration;Verbal cues   Comprehension Verbalized understanding;Returned demonstration           Short Term Clinic Goals - 05/11/14 1652    CC Short Term Goal  #1   Title short term goals=long term goals             Long Term Clinic Goals - 05/11/14 1646    CC Long Term Goal  #1   Title pt have reduction of circumference of left arm at 10 cm proximal to the ulnar styolid of .5 cm    Time 4   Period Weeks   Status On-going   CC Long Term Goal  #2   Title pt will verbalize understanding of strength ABC program    Time 4   Period Weeks   Status On-going   CC Long Term Goal  #3   Title pt will state a 25 % improvement in symptoms.   Time 4   Period Weeks   Status On-going            Plan - 05/11/14 1648    Clinical Impression Statement Ms. [name] returns to PT after having not been here since 04/05/2014.  She is stil bothered by lymphedema in her left forearm.  We discussed options for her to use her nighttime sleeve during th day and exercise her wrist and hand with it on to help move this stubborn fluid.  She also is interested in learning  the Strength ABC program that she could do at [employer] to help reduce flare ups    Pt will benefit from skilled therapeutic intervention in order to improve on the following deficits Increased edema;Decreased strength;Decreased endurance   Rehab Potential Good   Clinical Impairments Affecting Rehab Potential axillary lymph node dissection and past radiation treatment   PT Frequency 1x / week   PT Duration 4 weeks   PT Treatment/Interventions Therapeutic exercise;Scar mobilization;Patient/family education;Manual techniques;Manual lymph drainage   PT Next Visit Plan Instruct in and perform Strength ABC program.        Problem List Patient Active Problem List   Diagnosis Date Noted  . Endometriosis of ovary 01/25/2014  . Fibroids, subserous 01/25/2014  . Lymphedema of arm 01/09/2014  . Malignant neoplasm of female breast   . Chemotherapy induced neutropenia 10/10/2013  . Lower back pain 10/10/2013  . Bilateral lower extremity edema 09/08/2013  . Anxiety 09/08/2013  . Depression 08/10/2013  . Anemia, unspecified 07/20/2013  . Pain in left axilla 07/06/2013  . Breast cancer metastasized to axillary lymph node-left 05/11/2013   Stacy Heinz. Bell Shark, PT   05/11/2014, 4:54 PM  [name], [place], [address] Phone: [phone]   Fax:  [phone]

## 2014-05-15 ENCOUNTER — Ambulatory Visit: Payer: 59 | Admitting: Physical Therapy

## 2014-05-15 DIAGNOSIS — I890 Lymphedema, not elsewhere classified: Principal | ICD-10-CM

## 2014-05-15 DIAGNOSIS — E8989 Other postprocedural endocrine and metabolic complications and disorders: Secondary | ICD-10-CM | POA: Diagnosis not present

## 2014-05-15 DIAGNOSIS — M25612 Stiffness of left shoulder, not elsewhere classified: Secondary | ICD-10-CM

## 2014-05-15 NOTE — Therapy (Signed)
[place], [place], [address] Phone: [phone]   Fax:  [phone]  Physical Therapy Treatment  Patient Details  Name: [name] MRN: [id number] Date of Birth: 1975-11-25 Referring Provider:  Earnstine Regal, PA-C  Encounter Date: 05/15/2014      PT End of Session - 05/15/14 0817    Visit Number (p) 800      Past Medical History  Diagnosis Date  . Cancer     breast ca  . Malignant neoplasm of breast (female), unspecified site   . Radiation 10/24/13-12/09/13    Left chestwall/supraclav./scar  . GERD (gastroesophageal reflux disease)   . Back muscle spasm     occasional tx with skelaxin    Past Surgical History  Procedure Laterality Date  . Bilateral total mastectomy with axillary lymph node dissection    . Tonsillectomy    . Leep    . Portacath placement N/A 06/16/2013    Procedure: POWER PORT PLACEMENT;  Surgeon: Shann Medal, MD;  Location: [facility];  Service: General;  Laterality: N/A;  . Axillary lymph node dissection Left 06/16/2013    Procedure: LEFT AXILLARY NODE DISSECTION;  Surgeon: Shann Medal, MD;  Location: [facility];  Service: General;  Laterality: Left;  . Breast surgery    . Wisdom tooth extraction    . Right leg surgery      broken femur - has rod in leg  . Laparoscopic bilateral salpingo oopherectomy Bilateral 01/25/2014    Procedure: LAPAROSCOPIC BILATERAL SALPINGO OOPHORECTOMY;  Surgeon: Eldred Manges, MD;  Location: [facility];  Service: Gynecology;  Laterality: Bilateral;  . Port-a-cath removal N/A 01/25/2014    Procedure: REMOVAL PORT-A-CATH;  Surgeon: Alphonsa Overall, MD;  Location: [facility];  Service: General;  Laterality: N/A;    There were no vitals taken for this visit.  Visit Diagnosis:  Lymphedema of upper extremity following lymphadenectomy  Shoulder joint stiffness, left      Subjective Assessment - 05/15/14 0816    Symptoms ready to start the strength  program.  nighttime garment is still too hot at night , but she was able to wear it yesterday for about 5 hours. She wore the daytime garment after that   Currently in Pain? No/denies                    [facility] Adult PT Treatment/Exercise - 05/15/14 1217    Lumbar Exercises: Stretches   Active Hamstring Stretch 2 reps   Lower Trunk Rotation 2 reps   Hip Flexor Stretch 2 reps   Quad Stretch 2 reps   Piriformis Stretch 2 reps   Lumbar Exercises: Supine   Bridge 10 reps   Lumbar Exercises: Sidelying   Clam 10 reps  on each side   Lumbar Exercises: Quadruped   Opposite Arm/Leg Raise 5 reps;Right arm/Left leg;Left arm/Right leg   Knee/Hip Exercises: Standing   Functional Squat --  encouraged "power up"   Other Standing Knee Exercises standing hip abduction 10 on each side   Shoulder Exercises: Supine   Other Supine Exercises chest press 2 sets of 10 with 2#   Shoulder Exercises: Stretch   Cross Chest Stretch 2 reps   Wall Stretch - Flexion 2 reps   Wall Stretch - ABduction 2 reps   Ankle Exercises: Stretches   Press photographer 2 reps                   Short Term Clinic Goals -  05/11/14 1652    CC Short Term Goal  #1   Title short term goals=long term goals             Long Term Clinic Goals - 05/11/14 1646    CC Long Term Goal  #1   Title pt have reduction of circumference of left arm at 10 cm proximal to the ulnar styolid of .5 cm   Time 4   Period Weeks   Status On-going   CC Long Term Goal  #2   Title pt will verbalize understanding of strength ABC program    Time 4   Period Weeks   Status On-going   CC Long Term Goal  #3   Title pt will state a 25 % improvement in symptoms.   Time 4   Period Weeks   Status On-going            Plan - 05/15/14 1223    PT Next Visit Plan Continue with exercise and logging  check to see that paitent did exercise at home  and logged correctly        Problem List Patient Active Problem List    Diagnosis Date Noted  . Endometriosis of ovary 01/25/2014  . Fibroids, subserous 01/25/2014  . Lymphedema of arm 01/09/2014  . Malignant neoplasm of female breast   . Chemotherapy induced neutropenia 10/10/2013  . Lower back pain 10/10/2013  . Bilateral lower extremity edema 09/08/2013  . Anxiety 09/08/2013  . Depression 08/10/2013  . Anemia, unspecified 07/20/2013  . Pain in left axilla 07/06/2013  . 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 Owens Shark, PT    05/15/2014, 12:24 PM  [place], [place], [address] Phone: [phone]   Fax:  [phone]

## 2014-05-15 NOTE — Patient Instructions (Addendum)
Watch lymphedema education session: [email or web address]   Courses  Online  Strength After Breast Cancer On the right hand of the page click on the Lymphedema Education Session  Pt do do one more exercise session with same weights before return next week.

## 2014-05-24 ENCOUNTER — Ambulatory Visit: Payer: 59 | Admitting: Physical Therapy

## 2014-05-24 DIAGNOSIS — E8989 Other postprocedural endocrine and metabolic complications and disorders: Secondary | ICD-10-CM | POA: Diagnosis not present

## 2014-05-24 DIAGNOSIS — M25612 Stiffness of left shoulder, not elsewhere classified: Secondary | ICD-10-CM

## 2014-05-24 DIAGNOSIS — I890 Lymphedema, not elsewhere classified: Principal | ICD-10-CM

## 2014-05-24 NOTE — Therapy (Signed)
[place], [place], [address] Phone: [phone]   Fax:  [phone]  Physical Therapy Treatment  Patient Details  Name: [name] MRN: [id number] Date of Birth: [birth year] Referring Provider:  Earnstine Regal, PA-C  Encounter Date: 05/24/2014      PT End of Session - 05/24/14 1131    Visit Number 4   Number of Visits 8   Date for PT Re-Evaluation 06/08/14   PT Start Time 1100   PT Stop Time 1130   PT Time Calculation (min) 30 min   Activity Tolerance Patient tolerated treatment well   Behavior During Therapy [facility] for tasks assessed/performed      Past Medical History  Diagnosis Date  . Cancer     breast ca  . Malignant neoplasm of breast (female), unspecified site   . Radiation 10/24/13-12/09/13    Left chestwall/supraclav./scar  . GERD (gastroesophageal reflux disease)   . Back muscle spasm     occasional tx with skelaxin    Past Surgical History  Procedure Laterality Date  . Bilateral total mastectomy with axillary lymph node dissection    . Tonsillectomy    . Leep    . Portacath placement N/A 06/16/2013    Procedure: POWER PORT PLACEMENT;  Surgeon: Shann Medal, MD;  Location: [facility];  Service: General;  Laterality: N/A;  . Axillary lymph node dissection Left 06/16/2013    Procedure: LEFT AXILLARY NODE DISSECTION;  Surgeon: Shann Medal, MD;  Location: [facility];  Service: General;  Laterality: Left;  . Breast surgery    . Wisdom tooth extraction    . Right leg surgery      broken femur - has rod in leg  . Laparoscopic bilateral salpingo oopherectomy Bilateral 01/25/2014    Procedure: LAPAROSCOPIC BILATERAL SALPINGO OOPHORECTOMY;  Surgeon: Eldred Manges, MD;  Location: [facility];  Service: Gynecology;  Laterality: Bilateral;  . Port-a-cath removal N/A 01/25/2014    Procedure: REMOVAL PORT-A-CATH;  Surgeon: Alphonsa Overall, MD;  Location: [facility];  Service: General;  Laterality: N/A;     There were no vitals taken for this visit.  Visit Diagnosis:  Lymphedema of upper extremity following lymphadenectomy  Shoulder joint stiffness, left      Subjective Assessment - 05/24/14 1106    Symptoms Pt pleased with progress.  She feels that she doesn't have swelling in her arm. She is wearing nightime garment during the day and wears her daytime compression sleeve at times. but always when she exercises.    Currently in Pain? No/denies          [facility] PT Assessment - 05/24/14 1112    AROM   Left Shoulder Flexion 158 Degrees   Left Shoulder ABduction 152 Degrees   Left Shoulder Internal Rotation 90 Degrees   Left Shoulder External Rotation 60 Degrees           LYMPHEDEMA/ONCOLOGY QUESTIONNAIRE - 05/24/14 1118    Left Upper Extremity Lymphedema   15 cm Proximal to Olecranon Process 33 cm   10 cm Proximal to Olecranon Process 31.5 cm   Olecranon Process 27.5 cm   15 cm Proximal to Ulnar Styloid Process 28.8 cm   10 cm Proximal to Ulnar Styloid Process 26 cm   Just Proximal to Ulnar Styloid Process 18.9 cm   Across Hand at [employer] 19.2 cm   At [place] of 2nd Digit 6.4 cm  PT Education - 05/24/14 1130    Education Details weight resistance exercise progression   Person(s) Educated Patient   Methods Explanation;Handout   Comprehension Verbalized understanding           Short Term Clinic Goals - 05/24/14 1211    CC Short Term Goal  #1   Title short term goals=long term goals             Long Term Clinic Goals - 05/24/14 1214    CC Long Term Goal  #1   Title pt have reduction of circumference of left arm at 10 cm proximal to the ulnar styolid of .5 cm   Status Partially Met  decrease by .3 cm   CC Long Term Goal  #2   Title pt will verbalize understanding of strength ABC program    Status Achieved   CC Long Term Goal  #3   Title pt will state a 25 % improvement in symptoms.   Status Achieved            Plan -  05/24/14 1142    Clinical Impression Statement Pt is extremely pleased that she has seen reduction in her arm and that it feels better.  She has improved range of motion and feels that she is doing everthing without difficulty.  Reinforced that she will continue to wear her compressiion and discussed how to gradually and steadily increase her weight resistance.  She feels that she is ready for discharge.  .    PT Next Visit Plan Discharge this episode   Consulted and Agree with Plan of Care Patient        Problem List Patient Active Problem List   Diagnosis Date Noted  . Endometriosis of ovary 01/25/2014  . Fibroids, subserous 01/25/2014  . Lymphedema of arm 01/09/2014  . Malignant neoplasm of female breast   . Chemotherapy induced neutropenia 10/10/2013  . Lower back pain 10/10/2013  . Bilateral lower extremity edema 09/08/2013  . Anxiety 09/08/2013  . Depression 08/10/2013  . Anemia, unspecified 07/20/2013  . Pain in left axilla 07/06/2013  . Breast cancer metastasized to axillary lymph node-left 05/11/2013   PHYSICAL THERAPY DISCHARGE SUMMARY  Visits from Start of Care: 4  Current functional level related to goals / functional outcomes: Pt is functionally independent in managing her lymphedema   Remaining deficits: Ongoing lymphedema management   Education / Equipment: Progressively increasing weight training strength program  Plan: Patient agrees to discharge.  Patient goals were partially met. Patient is being discharged due to                                                     ?????         Donato Heinz. Owens Shark, PT  05/24/2014, 12:28 PM  [place], [place], [address] Phone: [phone]   Fax:  [phone]

## 2014-05-24 NOTE — Patient Instructions (Addendum)
Increase weight be the least amount available. ( one pound at time for free weights, one plate at a time for machines increase after 2 sessions at a certain weight level.  In other words, increase about one a week. Continue logging and monitoring you arm for symptoms If you have pain or swelling increase that lasts for a week or longer, call us [phone]

## 2014-05-31 ENCOUNTER — Encounter: Payer: 59 | Admitting: Physical Therapy

## 2014-06-06 ENCOUNTER — Ambulatory Visit (HOSPITAL_BASED_OUTPATIENT_CLINIC_OR_DEPARTMENT_OTHER): Payer: 59 | Admitting: Oncology

## 2014-06-06 ENCOUNTER — Other Ambulatory Visit (HOSPITAL_BASED_OUTPATIENT_CLINIC_OR_DEPARTMENT_OTHER): Payer: 59

## 2014-06-06 ENCOUNTER — Telehealth: Payer: Self-pay | Admitting: Oncology

## 2014-06-06 VITALS — BP 121/89 | HR 76 | Temp 99.6°F | Resp 18 | Ht 67.0 in | Wt 211.8 lb

## 2014-06-06 DIAGNOSIS — D649 Anemia, unspecified: Secondary | ICD-10-CM

## 2014-06-06 DIAGNOSIS — C773 Secondary and unspecified malignant neoplasm of axilla and upper limb lymph nodes: Principal | ICD-10-CM

## 2014-06-06 DIAGNOSIS — C778 Secondary and unspecified malignant neoplasm of lymph nodes of multiple regions: Secondary | ICD-10-CM

## 2014-06-06 DIAGNOSIS — C50919 Malignant neoplasm of unspecified site of unspecified female breast: Secondary | ICD-10-CM

## 2014-06-06 DIAGNOSIS — F329 Major depressive disorder, single episode, unspecified: Secondary | ICD-10-CM

## 2014-06-06 DIAGNOSIS — C50912 Malignant neoplasm of unspecified site of left female breast: Secondary | ICD-10-CM

## 2014-06-06 DIAGNOSIS — I890 Lymphedema, not elsewhere classified: Secondary | ICD-10-CM

## 2014-06-06 LAB — COMPREHENSIVE METABOLIC PANEL (CC13)
ALBUMIN: 3.6 g/dL (ref 3.5–5.0)
ALT: 11 U/L (ref 0–55)
AST: 15 U/L (ref 5–34)
Alkaline Phosphatase: 76 U/L (ref 40–150)
Anion Gap: 9 mEq/L (ref 3–11)
BILIRUBIN TOTAL: 0.24 mg/dL (ref 0.20–1.20)
BUN: 11.7 mg/dL (ref 7.0–26.0)
CO2: 25 mEq/L (ref 22–29)
Calcium: 9.7 mg/dL (ref 8.4–10.4)
Chloride: 109 mEq/L (ref 98–109)
Creatinine: 1 mg/dL (ref 0.6–1.1)
EGFR: 81 mL/min/{1.73_m2} — ABNORMAL LOW (ref >90)
Glucose: 97 mg/dl (ref 70–140)
Potassium: 3.9 mEq/L (ref 3.5–5.1)
SODIUM: 144 meq/L (ref 136–145)
Total Protein: 7.1 g/dL (ref 6.4–8.3)

## 2014-06-06 LAB — CBC WITH DIFFERENTIAL/PLATELET
BASO%: 0.7 % (ref 0.0–2.0)
Basophils Absolute: 0 10*3/uL (ref 0.0–0.1)
EOS%: 4.7 % (ref 0.0–7.0)
Eosinophils Absolute: 0.2 10*3/uL (ref 0.0–0.5)
HCT: 37.3 % (ref 34.8–46.6)
HGB: 12.1 g/dL (ref 11.6–15.9)
LYMPH%: 42.7 % (ref 14.0–49.7)
MCH: 28.6 pg (ref 25.1–34.0)
MCHC: 32.4 g/dL (ref 31.5–36.0)
MCV: 88.2 fL (ref 79.5–101.0)
MONO#: 0.3 10*3/uL (ref 0.1–0.9)
MONO%: 6.5 % (ref 0.0–14.0)
NEUT#: 1.8 10*3/uL (ref 1.5–6.5)
NEUT%: 45.4 % (ref 38.4–76.8)
PLATELETS: 188 10*3/uL (ref 145–400)
RBC: 4.23 10*6/uL (ref 3.70–5.45)
RDW: 13.9 % (ref 11.2–14.5)
WBC: 4 10*3/uL (ref 3.9–10.3)
lymph#: 1.7 10*3/uL (ref 0.9–3.3)

## 2014-06-06 NOTE — Telephone Encounter (Signed)
, °

## 2014-06-06 NOTE — Progress Notes (Signed)
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 A biopsy of the mass in question [facility] showed ([id number]) and invasive lobular carcinoma, which was estrogen and progesterone receptor both 100% positive, both with strong staining intensity, but HER-2 negative at 1+. Bilateral breast MRI 12/13/2009 in [place] showed in the left breast an irregularly marginated mass measuring 3.2 cm. There were 4 or 5 separate satellite lesions laterally and superior to the primary.  Accordingly, after appropriate discussion, the patient underwent bilateral mastectomies with left sentinel lymph node sampling 01/02/2010. The right breast was benign. The left breast showed a 2.3 cm invasive lobular carcinoma, grade 3, with a total of 6 sentinel lymph nodes removed, all negative, although 2 showed isolated tumor cells by immunostaining. Margins were negative.  An Oncotype BX was sent, with a recurrence score of 22, predicting a risk of distant recurrence within 10 years with 14% of the patients only systemic treatment was tamoxifen. The patient was treated with CMF chemotherapy between 02/22/2010 and 07/05/2010, receiving 7 cycles at which point the patient refused further chemotherapy though there have been no major toxicities at according to the oncology note from Dr. Brent General. The patient was then started on tamoxifen 08/09/2010. By her  cancer took approximately 12-15 months then stopped because of hot flashes and insomnia problems.  More recently, the patient noted a change in her left axilla andunderwent bilateral breast MRI January to 2015 at [place]. The patient has bilateral submuscular silicone implants in place. The breast were unremarkable, but there were several lymph nodes with cortical thickening in the left axilla including a dominant 1 measuring 1.2 cm in the short axis. Ultrasonography of this area identified the lymph node in question and the patient underwent biopsy of the left axillary lymph node This showed ([id number]) near-total replacement of the lymph node by metastatic carcinoma. This was 96% estrogen receptor positive, and 84% progesterone receptor positive, both with strong staining intensity. The MIB-1 was 20%. HER-2 determination is pending.  The patient's subsequent history is as detailed below  INTERVAL HISTORY: [name] returns today for follow up of her breast cancer. The interval history is generally stable. She is tolerating tamoxifen well, with some hot flashes but in her experience no worse than before. She doesn't have a vaginal discharge. She is obtaining the drug essentially free of charge at this point  REVIEW OF SYSTEMS: [name] still feels depressed. 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 She wonders if she should go back to school and study to be a travel agent, or work with pets. She is making it to the gym a couple of times a week. Her left years particularly stopped up but both ears feel little "funny". The left side is tight from the prior radiation. She is still doing some stretching exercises. Aside from this a detailed review of systems was stable  PAST MEDICAL HISTORY: Past Medical History  Diagnosis Date  . Cancer  breast ca  . Malignant neoplasm of breast (female), unspecified site   . Radiation 10/24/13-12/09/13    Left  chestwall/supraclav./scar  . GERD (gastroesophageal reflux disease)   . Back muscle spasm     occasional tx with skelaxin    PAST SURGICAL HISTORY: Past Surgical History  Procedure Laterality Date  . Bilateral total mastectomy with axillary lymph node dissection    . Tonsillectomy    . Leep    . Portacath placement N/A 06/16/2013    Procedure: POWER PORT PLACEMENT;  Surgeon: Shann Medal, MD;  Location: [facility];  Service: General;  Laterality: N/A;  . Axillary lymph node dissection Left 06/16/2013    Procedure: LEFT AXILLARY NODE DISSECTION;  Surgeon: Shann Medal, MD;  Location: [facility];  Service: General;  Laterality: Left;  . Breast surgery    . Wisdom tooth extraction    . Right leg surgery      broken femur - has rod in leg  . Laparoscopic bilateral salpingo oopherectomy Bilateral 01/25/2014    Procedure: LAPAROSCOPIC BILATERAL SALPINGO OOPHORECTOMY;  Surgeon: Eldred Manges, MD;  Location: [facility];  Service: Gynecology;  Laterality: Bilateral;  . Port-a-cath removal N/A 01/25/2014    Procedure: REMOVAL PORT-A-CATH;  Surgeon: Alphonsa Overall, MD;  Location: [facility];  Service: General;  Laterality: N/A;    FAMILY HISTORY Family History  Problem Relation Age of Onset  . Other Mother 75    glioblastoma; deceased 9  . Other Father 1    glioblastoma; deceased 79   according to the patient both her parents died from a primary brain cancers, namely we have the stomas, her father at 31, her mother at 42. The patient had one brother and one sister. There is no history of breast or ovarian cancer in the family  GYNECOLOGIC HISTORY:   (Reviewed 10/10/2013) Menarche age 29. The patient has never carried a child to term. She was still having regular periods at the time of her recent diagnosis. LMP 07/18/2013 as of 09/08/2013.  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 She shares a home with her significant other, [name], who works at installing car radios and also in [place]: Not in [place]:  (Reviewed 10/10/2013) History  Substance Use Topics  . Smoking status: Never Smoker   . Smokeless tobacco: Never Used  . Alcohol Use: Yes     Comment: socially wine     Colonoscopy: Never  PAP: December 2014  Bone density: Never  Lipid panel: Not on file   Allergies  Allergen Reactions  . Sulfa Antibiotics Hives and Itching  . Gadolinium Derivatives Hives    After gado injection, pt had a few small hives on left breast area. Treated with benadryl by dr Janeece Fitting   . Petrolatum Hives and Rash    Current Outpatient Prescriptions  Medication Sig Dispense Refill  . ibuprofen (ADVIL,MOTRIN) 600 MG tablet 1  po  pc every 6 hours for 3 days then prn-pain (Patient not taking: Reported on 05/11/2014) 30 tablet 1  . metaxalone (SKELAXIN) 800 MG tablet Take 1 tablet (800 mg total) by mouth 3 (three) times daily as needed for muscle spasms. (Patient not taking: Reported on 05/11/2014) 60 tablet 0  . omeprazole (PRILOSEC)  40 MG capsule Take 1 capsule (40 mg total) by mouth daily. 90 capsule 3  . oxyCODONE-acetaminophen (PERCOCET/ROXICET) 5-325 MG per tablet Take 1 tablet by mouth every 4 (four) hours as needed. 30 tablet 0  . tamoxifen (NOLVADEX) 20 MG tablet Take 1 tablet (20 mg total) by mouth daily. 30 tablet 4  . VENTOLIN HFA 108 (90 BASE) MCG/ACT inhaler      No current facility-administered medications for this visit.    OBJECTIVE: Young [place] American woman who appears  tired but is in no acute distress Filed Vitals:   06/06/14 1511  BP: 121/89  Pulse: 76  Temp: 99.6 F (37.6 C)  Resp: 18     Body mass index is 33.16 kg/(m^2).    ECOG FS:1 - Symptomatic but completely ambulatory Filed Weights    06/06/14 1511  Weight: 211 lb 12.8 oz (96.072 kg)   Sclerae unicteric, pupils equal and reactive Oropharynx clear; significant wax in both ears, left greater than right No cervical or supraclavicular adenopathy Lungs no rales or rhonchi Heart regular rate and rhythm Abd soft, nontender, positive bowel sounds MSK no focal spinal tenderness, grade 1 left upper extremity lymphedema Neuro: nonfocal, well oriented, appropriate affect Breasts: Status post bilateral mastectomies with implant reconstruction. There is still some hyperpigmentation on the left. There is no evidence of local recurrence. Both axillae are benign.    LAB RESULTS:   Lab Results  Component Value Date   WBC 4.0 06/06/2014   NEUTROABS 1.8 06/06/2014   HGB 12.1 06/06/2014   HCT 37.3 06/06/2014   MCV 88.2 06/06/2014   PLT 188 06/06/2014      Chemistry      Component Value Date/Time   NA 144 06/06/2014 1440   NA 135* 07/16/2013 0535   K 3.9 06/06/2014 1440   K 4.4 07/16/2013 0535   CL 97 07/16/2013 0535   CO2 25 06/06/2014 1440   CO2 25 07/16/2013 0535   BUN 11.7 06/06/2014 1440   BUN 11 07/16/2013 0535   CREATININE 1.0 06/06/2014 1440   CREATININE 0.81 07/16/2013 0535      Component Value Date/Time   CALCIUM 9.7 06/06/2014 1440   CALCIUM 10.1 07/16/2013 0535   ALKPHOS 76 06/06/2014 1440   ALKPHOS 92 07/16/2013 0535   AST 15 06/06/2014 1440   AST 16 07/16/2013 0535   ALT 11 06/06/2014 1440   ALT 14 07/16/2013 0535   BILITOT 0.24 06/06/2014 1440   BILITOT 0.9 07/16/2013 0535      STUDIES:  CLINICAL DATA: Subsequent treatment strategy for left breast lobular invasive carcinoma.  EXAM: NUCLEAR MEDICINE PET SKULL BASE TO THIGH  TECHNIQUE: 10.0 mCi F-18 FDG was injected intravenously. Full-ring PET imaging was performed from the skull base to thigh after the radiotracer. CT data was obtained and used for attenuation correction and anatomic localization.  FASTING BLOOD GLUCOSE: Value:  99 mg/dl  COMPARISON: 05/27/2013  FINDINGS: NECK  No hypermetabolic lymph nodes in the neck. Previously seen hypermetabolic left supraclavicular lymph node is no longer visualized.  CHEST  No hypermetabolic mediastinal or hilar nodes. No suspicious pulmonary nodules on the CT scan. Infiltrate is seen in the peripheral left upper lobe which shows mild hypermetabolic activity and is consistent with post radiation change.  Previously seen hypermetabolic left axillary lymph nodes along are not visualized. Postop changes from previous left axillary lymphadenectomy are stable in appearance by CT in show low-grade metabolic activity.  ABDOMEN/PELVIS  No abnormal hypermetabolic activity within the liver, pancreas,  adrenal glands, or spleen. No hypermetabolic lymph nodes in the abdomen or pelvis.  SKELETON  No focal hypermetabolic activity to suggest skeletal metastasis.  IMPRESSION: Complete metabolic response to therapy. No evidence of metabolically active disease.   Electronically Signed  By: Earle Gell M.D.  On: 04/12/2014 12:58    ASSESSMENT: 39 y.o. BRCA negative [name] woman  (1) status post bilateral mastectomies with left axillary lymph node sampling [6 nodes removed] 01/02/2010 for a pT2 pN0(i+), stage IIA invasive lobular breast cancer, grade 3, estrogen and progesterone receptor positive, HER-2 not amplified; right breast was benign  (2) Oncotype DX score of 22 predicted a 14% risk of distant recurrence within 10 years if the patient's only systemic treatment is tamoxifen for 5 years  (3) status post CMF(cyclophosphamide, fluorouracil, methotrexate) x7 given between October of 2011 and March of 2012 (7 of 8 planned treatments completed)  (4) tamoxifen started April 2012, discontinued approximately July of 2013 (about 15 months) because of problems with hot flashes and insomnia  (5) pathologically documented left axillary recurrence  05/05/2013, the tumor being again estrogen and progesterone receptor positive, with HER-2 not amplified, and MIB-1 of 15%. Staging CT/PET 05/27/2013 show left axillary and supraclavicular recurrence, no distant disease  (6) status post completion left axillary lymph node dissection 06/16/2013 showing a total of 10/15 lymph nodes involved by tumor, with evidence of extracapsular extension (TX N3 = stage IIIC)  (7) treated with carboplatin/ docetaxel, first dose on 07/11/2013, repeated every 21 days x 4 with Neulasta support on day 2.  Final dose was given on 09/12/2013, with chemotherapy discontinued after 4 cycles due to continuing low counts.  (8) radiation completed 12/09/2013: Left chest wall / 50.4 Gray @ 1.8 Gray per fraction x 28 fractions Left Supraclavicular fossa and PAB/ 45 Gray _0 .8 Gray per fraction x 25 fractions Left scar / 10 Gray at [employer] per fraction x 5 fractions   (9) bilateral salpingo-oophorectomy on 01/25/14  (10)   lymphedema and limited range of motion in the left upper extremity secondary to left axillary lymph node dissection, being treated through the lymphedema clinic  - improved   (13) sequencing and deletion/duplication analysis of 17 genes performed September 2015 at [facility] did not reveal any deleterious mutation in  ATM, BARD1, BRCA1, BRCA2, BRIP1, CDH1, CHEK2, MRE11A, MUTYH, NBN, NF1, PALB2, PTEN, RAD50, RAD51C, RAD51D, and TP53.  (14) tamoxifen started 02/27/2014  PLAN: I think Kellyann is still suffering from some posttraumatic stress. This is very common in her situation and especially given her youth and the fact that she has had to enter menopause so abruptly. She has joined a gym and is going twice a week. I think it would be even better she went 5 times a week and worked for about 1-1-1/2 hours each time. I think this would make her feel better and give her more confidence.  I reviewed the PET scan with her. It is very favorable.  She would like  to perhaps go back to school but does not have a definitive plan. I think it would be wonderful if she could explore what the community college offers and get retraining something that she enjoys and can make a career out of.  As far as breast cancer's concern Lilia Pro to see her again in a couple months just to reinforce the exercise and retraining planned issues, then she will see me again in September and we will start seeing her every 6 months from that point.  She  will be contacting her primary care physician regarding getting her earwax removed.  Chauncey Cruel, MD   06/06/2014 4:11 PM

## 2014-06-07 ENCOUNTER — Encounter: Payer: Self-pay | Admitting: *Deleted

## 2014-06-07 ENCOUNTER — Telehealth: Payer: Self-pay | Admitting: *Deleted

## 2014-06-07 ENCOUNTER — Encounter: Payer: 59 | Admitting: Physical Therapy

## 2014-06-07 NOTE — Telephone Encounter (Signed)
Pt called b/c she needed an update disability letter with most current date she was seen by physician. Pt was seen by Dr. Jana Hakim yesterday. I have mailed letter to pt.

## 2014-06-08 ENCOUNTER — Emergency Department (HOSPITAL_COMMUNITY)
Admission: EM | Admit: 2014-06-08 | Discharge: 2014-06-08 | Disposition: A | Discharge status: Home / Self Care | Payer: 59 | Attending: Emergency Medicine | Admitting: Emergency Medicine

## 2014-06-08 ENCOUNTER — Encounter (HOSPITAL_COMMUNITY): Payer: Self-pay | Admitting: Emergency Medicine

## 2014-06-08 DIAGNOSIS — Z79899 Other long term (current) drug therapy: Secondary | ICD-10-CM | POA: Diagnosis not present

## 2014-06-08 DIAGNOSIS — K219 Gastro-esophageal reflux disease without esophagitis: Secondary | ICD-10-CM | POA: Insufficient documentation

## 2014-06-08 DIAGNOSIS — Z853 Personal history of malignant neoplasm of breast: Secondary | ICD-10-CM | POA: Insufficient documentation

## 2014-06-08 DIAGNOSIS — H9202 Otalgia, left ear: Secondary | ICD-10-CM | POA: Diagnosis present

## 2014-06-08 DIAGNOSIS — H6122 Impacted cerumen, left ear: Secondary | ICD-10-CM | POA: Diagnosis not present

## 2014-06-08 MED ORDER — DOCUSATE SODIUM 50 MG/5ML PO LIQD
100.0000 mg | Freq: Once | ORAL | Status: AC
  Administered 2014-06-08: 100 mg via OTIC
  Filled 2014-06-08: qty 10

## 2014-06-08 NOTE — ED Notes (Signed)
Pt reports 1 week hx of l/ear "fullness and pressure". Seen by oncologist yesterday, recommended that pt use OTC drops to dissolve "plug" in l/ear. Drops did not help.

## 2014-06-08 NOTE — ED Provider Notes (Signed)
CSN: [id number]     Arrival date & time 06/08/14  1022 History  This chart was scribed for Monico Blitz, PA-C working with Orpah Greek, * by Mercy Moore, ED Scribe. This patient was seen in room [place] and the patient's care was started at 12:23 PM.   Chief Complaint  Patient presents with  . Ear Fullness    pressurein l/ear x 1 week   The history is provided by the patient. No language interpreter was used.   HPI Comments: [name] is a 39 y.o. female who presents to the Emergency Department complaining of left ear fullness with reduced hearing, onset one week ago. Patient, with history of breast cancer, reports visiting her oncologist who recommended OTC Debrox. Patient denies relief with treatment.   Past Medical History  Diagnosis Date  . Cancer     breast ca  . Malignant neoplasm of breast (female), unspecified site   . Radiation 10/24/13-12/09/13    Left chestwall/supraclav./scar  . GERD (gastroesophageal reflux disease)   . Back muscle spasm     occasional tx with skelaxin   Past Surgical History  Procedure Laterality Date  . Bilateral total mastectomy with axillary lymph node dissection    . Tonsillectomy    . Leep    . Portacath placement N/A 06/16/2013    Procedure: POWER PORT PLACEMENT;  Surgeon: Shann Medal, MD;  Location: [facility];  Service: General;  Laterality: N/A;  . Axillary lymph node dissection Left 06/16/2013    Procedure: LEFT AXILLARY NODE DISSECTION;  Surgeon: Shann Medal, MD;  Location: [facility];  Service: General;  Laterality: Left;  . Breast surgery    . Wisdom tooth extraction    . Right leg surgery      broken femur - has rod in leg  . Laparoscopic bilateral salpingo oopherectomy Bilateral 01/25/2014    Procedure: LAPAROSCOPIC BILATERAL SALPINGO OOPHORECTOMY;  Surgeon: Eldred Manges, MD;  Location: [facility];  Service: Gynecology;  Laterality: Bilateral;  . Port-a-cath removal N/A 01/25/2014    Procedure: REMOVAL PORT-A-CATH;   Surgeon: Alphonsa Overall, MD;  Location: [facility];  Service: General;  Laterality: N/A;   Family History  Problem Relation Age of Onset  . Other Mother 35    glioblastoma; deceased 66  . Cancer Mother   . Other Father 22    glioblastoma; deceased 66  . Cancer Father   . Hypertension Other    History  Substance Use Topics  . Smoking status: Never Smoker   . Smokeless tobacco: Never Used  . Alcohol Use: Yes     Comment: socially wine   OB History    Obstetric Comments   Menarche 10,No full-term pregnancies.birth control pills x 18 years. LMP  07/18/2013     Review of Systems  Constitutional: Negative for fever and chills.  HENT: Positive for hearing loss. Negative for rhinorrhea.   Respiratory: Negative for cough.    Allergies  Sulfa antibiotics; Gadolinium derivatives; and Petrolatum  Home Medications   Prior to Admission medications   Medication Sig Start Date End Date Taking? Authorizing Provider  ibuprofen (ADVIL,MOTRIN) 600 MG tablet 1  po  pc every 6 hours for 3 days then prn-pain Patient not taking: Reported on 05/11/2014 01/25/14   Earnstine Regal, PA-C  metaxalone (SKELAXIN) 800 MG tablet Take 1 tablet (800 mg total) by mouth 3 (three) times daily as needed for muscle spasms. Patient not taking: Reported on 05/11/2014 01/03/14   Chauncey Cruel, MD  omeprazole (PRILOSEC) 40 MG capsule Take 1 capsule (40 mg total) by mouth daily. 07/18/13   Chauncey Cruel, MD  oxyCODONE-acetaminophen (PERCOCET/ROXICET) 5-325 MG per tablet Take 1 tablet by mouth every 4 (four) hours as needed. 01/25/14   Earnstine Regal, PA-C  tamoxifen (NOLVADEX) 20 MG tablet Take 1 tablet (20 mg total) by mouth daily. 02/27/14   Marcelino Duster, NP  VENTOLIN HFA 108 (90 BASE) MCG/ACT inhaler  12/26/13   Historical Provider, MD   Triage Vitals: BP 127/83 mmHg  Pulse 93  Temp(Src) 98.4 F (36.9 C) (Oral)  Resp 18  SpO2 98% Physical Exam  Constitutional: She is oriented to person, place, and time. She  appears well-developed and well-nourished. No distress.  HENT:  Head: Normocephalic and atraumatic.  Left-sided cerumen impaction  Eyes: EOM are normal.  Neck: Neck supple. No tracheal deviation present.  Cardiovascular: Normal rate.   Pulmonary/Chest: Effort normal. No respiratory distress.  Musculoskeletal: Normal range of motion.  Neurological: She is alert and oriented to person, place, and time.  Skin: Skin is warm and dry.  Psychiatric: She has a normal mood and affect. Her behavior is normal.  Nursing note and vitals reviewed.   ED Course  EAR CERUMEN REMOVAL Date/Time: 06/08/2014 12:46 PM Performed by: Monico Blitz Authorized by: Monico Blitz Consent: Verbal consent obtained. Consent given by: patient Patient identity confirmed: verbally with patient Local anesthetic: none Ceruminolytics applied: Ceruminolytics applied prior to the procedure. Location details: left ear Procedure type: curette and irrigation Patient sedated: no Patient tolerance: Patient tolerated the procedure well with no immediate complications   (including critical care time)  COORDINATION OF CARE: 12:26 PM- Discussed treatment plan with patient at bedside and patient agreed to plan.   Labs Review Labs Reviewed - No data to display  Imaging Review No results found.   EKG Interpretation None      MDM   Final diagnoses:  Cerumen impaction, left   Filed Vitals:   06/08/14 1033  BP: 127/83  Pulse: 93  Temp: 98.4 F (36.9 C)  TempSrc: Oral  Resp: 18  SpO2: 98%    Medications  docusate (COLACE) 50 MG/5ML liquid 100 mg (100 mg Both Ears Given 06/08/14 1212)    [name] is a pleasant 39 y.o. female presenting with left-sided cerumen impaction. Irrigation is performed and impaction is removed. Patient significant subjective improvement. Tympanic membrane is intact without signs of trauma or outer ear canal trauma after the procedure is performed.  Evaluation does  not show pathology that would require ongoing emergent intervention or inpatient treatment. Pt is hemodynamically stable and mentating appropriately. Discussed findings and plan with patient/guardian, who agrees with care plan. All questions answered. Return precautions discussed and outpatient follow up given.    I personally performed the services described in this documentation, which was scribed in my presence. The recorded information has been reviewed and is accurate.     Monico Blitz, PA-C 06/08/14 [place], MD 06/09/14 [phone]

## 2014-06-08 NOTE — ED Notes (Signed)
Pharmacy called for medication twice. . Stated that they will send now. PA advised of treatment delay.

## 2014-06-08 NOTE — Discharge Instructions (Signed)
Do not hesitate to return to the emergency room for any new, worsening or concerning symptoms.  Please obtain primary care using resource guide below. But the minute you were seen in the emergency room and that they will need to obtain records for further outpatient management.  Use Debrox once a month for 3 days to soften ceruman and prevent future impactions. NEVER use a Q tip in the ear canal  Cerumen Impaction A cerumen impaction is when the wax in your ear forms a plug. This plug usually causes reduced hearing. Sometimes it also causes an earache or dizziness. Removing a cerumen impaction can be difficult and painful. The wax sticks to the ear canal. The canal is sensitive and bleeds easily. If you try to remove a heavy wax buildup with a cotton tipped swab, you may push it in further. Irrigation with water, suction, and small ear curettes may be used to clear out the wax. If the impaction is fixed to the skin in the ear canal, ear drops may be needed for a few days to loosen the wax. People who build up a lot of wax frequently can use ear wax removal products available in your local drugstore. SEEK MEDICAL CARE IF:  You develop an earache, increased hearing loss, or marked dizziness. Document Released: 05/22/2004 Document Revised: 07/07/2011 Document Reviewed: 07/12/2009 [facility] Patient Information 2015 [place], [place]. This information is not intended to replace advice given to you by your health care provider. Make sure you discuss any questions you have with your health care provider.   Emergency Department Resource Guide 1) Find a Doctor and Pay Out of Pocket Although you won't have to find out who is covered by your insurance plan, it is a good idea to ask around and get recommendations. You will then need to call the office and see if the doctor you have chosen will accept you as a new patient and what types of options they offer for patients who are self-pay. Some doctors offer  discounts or will set up payment plans for their patients who do not have insurance, but you will need to ask so you aren't surprised when you get to your appointment.  2) Contact Your Local Health Department Not all health departments have doctors that can see patients for sick visits, but many do, so it is worth a call to see if yours does. If you don't know where your local health department is, you can check in your phone book. The CDC also has a tool to help you locate your state's health department, and many state websites also have listings of all of their local health departments.  3) Find a [facility] If your illness is not likely to be very severe or complicated, you may want to try a walk in clinic. These are popping up all over the country in pharmacies, drugstores, and shopping centers. They're usually staffed by nurse practitioners or physician assistants that have been trained to treat common illnesses and complaints. They're usually fairly quick and inexpensive. However, if you have serious medical issues or chronic medical problems, these are probably not your best option.  No Primary Care Doctor: - Call Health Connect at  [phone] - they can help you locate a primary care doctor that  accepts your insurance, provides certain services, etc. - Physician Referral Service- [phone]  Chronic Pain Problems: Organization         Address  Phone   Notes  [facility]  (  [phone] Patients need to be referred by their primary care doctor.   Medication Assistance: Organization         Address  Phone   Notes  [facility] Medication [facility] [address] [place], [place] [address] [phone] --Must be a resident of [facility] -- Must have NO insurance coverage whatsoever (no Medicaid/ Medicare, etc.) -- The pt. MUST have a primary care doctor that directs their care regularly and follows them in the community   MedAssist   [phone]   [employer]  [phone]    Agencies that provide inexpensive medical care: Organization         Address  Phone   Notes  [place]  [phone]   [facility]    [phone]   [facility] [place], [place] [address] [phone]   [place] [address], [place] [phone]   Planned Parenthood    [phone]   [facility]    [phone]   [place] and [place] [address], [place] Phone:  [phone], Fax:  [phone] Hours of Operation:  9 am - 6 pm, M-F.  Also accepts Medicaid/Medicare and self-pay.  [facility] for [place], [address], [place] Phone: [phone], Fax: [phone]. Hours of Operation:  8:30 am - 5:30 pm, M-F.  Also accepts Medicaid and self-pay.  [facility] [place] [address], [place] Phone: [phone]   [place], [place], [place] [phone], Ext. [phone] Mondays & Thursdays: 7-9 AM.  First 15 patients are seen on a first come, first serve basis.    [place] Providers:  Organization         Address  Phone   Notes  [facility] [address], [place] [phone] Also accepts self-pay patients.  [facility] [address], [place]  [phone]   [place], [address], [place] [phone]   [facility] [address] [place] [phone]   Lucianne Lei [address], [place]   [phone] Only accepts [place] Access [place] patients after they have their name applied to their card.   Self-Pay (no insurance) in [facility]:  Organization         Address  Phone   Notes  Sickle Cell Patients, [facility] [place] [phone]   [facility] [place] [phone]   [facility] [place]  [place], [place], [place] [phone]   [facility]/Dr. Osei-Bonsu  [address] [place] or [address], [place] [phone] Phone number for both [place] and [place] locations is the same.  Urgent Medical and [facility] [address], [place] [phone]   [facility] [address], [place] or [address] [phone]   [facility] [address], [place] [phone], phone; [phone], fax Sees patients 1st and 3rd Saturday of every month.  Must not qualify for public or private insurance (i.e. Medicaid, Medicare, [place] Health Choice, Veterans' Benefits)  Household income should be no more than 200% of the poverty level The clinic cannot treat you if you are pregnant or think you are pregnant  Sexually transmitted diseases are not treated at the clinic.    Dental Care: Organization         Address  Phone  Notes  [facility] [place] [phone] Accepts children up to age 49 who are enrolled in [place] or [place]; pregnant women with a Medicaid card; and children who have applied for Medicaid or [place] Health Choice, but were declined, whose parents can pay a reduced fee at time of service.  [facility] Department of [facility]  [address], [place] [phone] Accepts children up to age 8 who are enrolled in [place] or [place]; pregnant women with a Medicaid card; and children who have applied for Medicaid or [place] Health Choice, but were declined, whose parents can pay a reduced fee at time of service.  [place] Adult [facility] Access PROGRAM  [place]  [phone] Patients are seen by appointment only. Walk-ins are not accepted. [place] will see patients 22 years of age and older. Monday - Tuesday (8am-5pm) Most Wednesdays (8:30-5pm) $30 per visit, cash only  [facility] Adult Dental Access PROGRAM  [address], [facility] [phone] Patients are seen by appointment only. Walk-ins are not accepted. [place] will see patients 34 years of age and older. One Wednesday Evening (Monthly: Volunteer Based).  $30 per visit, cash only  [place]  [phone] for adults; Children under age 87, call Graduate Pediatric Dentistry at [phone]. Children aged 89-14, please call [phone] to request a pediatric application.  Dental services are provided in all areas of dental care including fillings, crowns and bridges, complete and partial dentures, implants, gum treatment, root canals, and extractions. Preventive care is also provided. Treatment is provided to both adults and children. Patients are selected via a lottery and there is often a waiting list.   [facility] [address] [place]  [phone] [email or web address]   [facility] [address] [place], [place] [phone], Ext. [phone] Second and Fourth Thursday of each month, opens at 6:30 AM; Clinic ends at 9 AM.  Patients are seen on a first-come first-served basis, and a limited number are seen during each clinic.   [facility]  [address] [place], [place] [phone]   Eligibility Requirements You must have lived in [place], [place], or [place] counties for at least the last three months.   You cannot be eligible for state or federal sponsored [employer], including [employer], [place], or [employer].   You generally cannot be eligible for healthcare insurance through your employer.    How to apply: Eligibility screenings are held every Tuesday and Wednesday  afternoon from 1:00 pm until 4:00 pm. You do not need an appointment for the interview!  [facility] [address] [place], [place]   [place]  [place] Department  [place]  [phone]    Behavioral Health Resources in the Community: Intensive Outpatient Programs Organization         Address  Phone  Notes  Iona [place]. [address] [place],  [place] [phone]   [facility] Outpatient [address], [place], [place]   ADS: Alcohol & Drug Svcs [address], [place], [place]   [place] [address]  [place], [place] or [phone]   Substance Abuse Resources Organization         Address  Phone  Notes  Alcohol and Drug Services  [phone]   [place]  [phone]   [facility] [place]   Chinita Pester  [phone]   Residential & Outpatient Substance Abuse Program  [phone]   Psychological Services Organization         Address  Phone  Notes  [facility]  [place]  [phone]   [place] [address], [place] or [phone]    Mobile Crisis Teams Organization         Address  Phone  Notes  [facility], Mobile Crisis Care Unit  [phone]   [facility]ive Psychotherapeutic Services  [address] [place], [place]   [place] [address], [place] [phone]    Self-Help/Support Groups Organization         Address  Phone             Notes  [place]. of [place] - variety of support groups  [place] Call for more information  Narcotics Anonymous (NA), [facility] [address], [employer] [place]  2 meetings at this location   Materials engineer         Address  Phone  Notes  [facility]AP Residential Treatment [place],    [place]  [phone]   [facility] [place]  [address], [place] [phone], [place], [place]   [place], [place] [phone] Admissions: 8am-3pm M-F  Incentives Substance [place] [address]    [place], [place] [phone]   [facility] [address] [place], [place], [place]   [facility] [address]  [place], [place]   [facility] - Intensive Outpatient [address] Kristeen [address], [place], [place]   [facility] ([place].,  [place], [place] [phone] or [phone]   Residential Treatment Services (RTS) [address], [place], [place] Accepts Medicaid  [facility] [place] [address]  [place] [phone] Substance Abuse/Addiction Treatment   [facility] Organization         Address  Phone  Notes  [facility]  [phone]   Domenic Schwab, PhD [address] [place], [place]   [phone] or [phone]   [place], [place] [phone]   [facility] [address], [place], [place] [phone] Insurance/Medicaid/sponsorship through [facility] and Families [address]                                    [place], [place] [phone] [place] [address], [place] [phone]    Dr. Adele Schilder  [phone]   [facility] Dept. 1) [address], [place] [address]) [address], [place] 3)  [place], [place] [phone]  [phone]   [facility] Child Abuse Hotline [phone] or [phone] (After Hours)

## 2014-06-16 NOTE — Telephone Encounter (Signed)
none

## 2014-07-06 ENCOUNTER — Encounter: Payer: Self-pay | Admitting: Oncology

## 2014-07-06 NOTE — Progress Notes (Signed)
I faxed to [facility]  Lt disability forms for the patient [phone]

## 2014-07-07 ENCOUNTER — Ambulatory Visit: Payer: Self-pay | Admitting: Radiation Oncology

## 2014-07-14 ENCOUNTER — Ambulatory Visit
Admission: RE | Admit: 2014-07-14 | Discharge: 2014-07-14 | Disposition: A | Discharge status: Home / Self Care | Payer: 59 | Source: Ambulatory Visit | Attending: Radiation Oncology | Admitting: Radiation Oncology

## 2014-07-14 VITALS — BP 119/82 | HR 85 | Temp 98.4°F | Wt 213.7 lb

## 2014-07-14 DIAGNOSIS — C50912 Malignant neoplasm of unspecified site of left female breast: Secondary | ICD-10-CM

## 2014-07-14 DIAGNOSIS — C773 Secondary and unspecified malignant neoplasm of axilla and upper limb lymph nodes: Principal | ICD-10-CM

## 2014-07-14 NOTE — Progress Notes (Signed)
Follow up radiation to left breast on 12/09/13.Denies any problems with breast states skin is tight.Has mild headache which started today.Takes excedrin migraine as needed.continues to take tamoxifen, denies any side effects.Needs 1 year ultrasound of breast instead of mammogram as she has implants.

## 2014-07-14 NOTE — Progress Notes (Signed)
   Department of Radiation Oncology  Phone:  [phone] Fax:        [phone]   Name: [name] MRN: [id number]  DOB: Sep 29, 1975  Date: 07/14/2014  Follow Up Visit Note  Diagnosis: Left axillary recurrence of breast cancer  Summary and Interval since last radiation: 60.4 Gy to the left breast/reconstructed breast completed 12/09/13  Interval History: [name] presents today for routine followup.  She is doing well. She is gaining weight despite dietary modifications and working out. She has hot flashes since her oopherectomy. The tamoxifen is not bothering her. She had a negative PET scan in [place]. She is scheduled to see Nira Conn in April.   Allergies:  Allergies  Allergen Reactions  . Sulfa Antibiotics Hives and Itching  . Gadolinium Derivatives Hives    After gado injection, pt had a few small hives on left breast area. Treated with benadryl by dr Janeece Fitting   . Petrolatum Hives and Rash    Medications:  Current Outpatient Prescriptions  Medication Sig Dispense Refill  . aspirin-acetaminophen-caffeine (EXCEDRIN MIGRAINE) 250-250-65 MG per tablet Take by mouth every 6 (six) hours as needed for headache.    [place] Kitchen omeprazole (PRILOSEC) 40 MG capsule Take 1 capsule (40 mg total) by mouth daily. 90 capsule 3  . tamoxifen (NOLVADEX) 20 MG tablet Take 1 tablet (20 mg total) by mouth daily. 30 tablet 4  . ibuprofen (ADVIL,MOTRIN) 600 MG tablet 1  po  pc every 6 hours for 3 days then prn-pain (Patient not taking: Reported on 05/11/2014) 30 tablet 1  . metaxalone (SKELAXIN) 800 MG tablet Take 1 tablet (800 mg total) by mouth 3 (three) times daily as needed for muscle spasms. (Patient not taking: Reported on 05/11/2014) 60 tablet 0  . oxyCODONE-acetaminophen (PERCOCET/ROXICET) 5-325 MG per tablet Take 1 tablet by mouth every 4 (four) hours as needed. (Patient not taking: Reported on 07/14/2014) 30 tablet 0  . VENTOLIN HFA 108 (90 BASE) MCG/ACT inhaler      No current  facility-administered medications for this encounter.    Physical Exam:  Filed Vitals:   07/14/14 1447  BP: 119/82  Pulse: 85  Temp: 98.4 F (36.9 C)  Weight: 213 lb 11.2 oz (96.934 kg)  SpO2: 98%   Healing hyperpigmented skin over left chest wall and skin, much improved.   IMPRESSION: [name] is a 39 y.o. female s/p RT for her left axillary recurrence.   PLAN:  She is doing well. We discussed weight loss and calorie counting now that she is basically in a post menopausal state. She will follow up with medical oncology and surgery. I will see her prn. We discussed that there is no guidance regarding surveillance imaging but she could discuss yearly CAP with medical oncology.      Thea Silversmith, MD

## 2014-07-17 ENCOUNTER — Encounter: Payer: Self-pay | Admitting: Oncology

## 2014-07-17 NOTE — Progress Notes (Signed)
I placed disability/fmla forms on desk of nurse for dr. Candace Gallus

## 2014-08-01 ENCOUNTER — Other Ambulatory Visit (HOSPITAL_BASED_OUTPATIENT_CLINIC_OR_DEPARTMENT_OTHER): Payer: 59

## 2014-08-01 ENCOUNTER — Other Ambulatory Visit: Payer: Self-pay | Admitting: *Deleted

## 2014-08-01 DIAGNOSIS — C50919 Malignant neoplasm of unspecified site of unspecified female breast: Secondary | ICD-10-CM

## 2014-08-01 DIAGNOSIS — C778 Secondary and unspecified malignant neoplasm of lymph nodes of multiple regions: Secondary | ICD-10-CM

## 2014-08-01 DIAGNOSIS — C773 Secondary and unspecified malignant neoplasm of axilla and upper limb lymph nodes: Principal | ICD-10-CM

## 2014-08-01 DIAGNOSIS — C50912 Malignant neoplasm of unspecified site of left female breast: Secondary | ICD-10-CM

## 2014-08-01 DIAGNOSIS — D649 Anemia, unspecified: Secondary | ICD-10-CM

## 2014-08-01 LAB — CBC WITH DIFFERENTIAL/PLATELET
BASO%: 0.4 % (ref 0.0–2.0)
Basophils Absolute: 0 10*3/uL (ref 0.0–0.1)
EOS%: 5.7 % (ref 0.0–7.0)
Eosinophils Absolute: 0.3 10*3/uL (ref 0.0–0.5)
HCT: 37.5 % (ref 34.8–46.6)
HGB: 12 g/dL (ref 11.6–15.9)
LYMPH%: 44.3 % (ref 14.0–49.7)
MCH: 28.3 pg (ref 25.1–34.0)
MCHC: 32 g/dL (ref 31.5–36.0)
MCV: 88.4 fL (ref 79.5–101.0)
MONO#: 0.3 10*3/uL (ref 0.1–0.9)
MONO%: 6.1 % (ref 0.0–14.0)
NEUT%: 43.5 % (ref 38.4–76.8)
NEUTROS ABS: 2.1 10*3/uL (ref 1.5–6.5)
Platelets: 197 10*3/uL (ref 145–400)
RBC: 4.24 10*6/uL (ref 3.70–5.45)
RDW: 13.7 % (ref 11.2–14.5)
WBC: 4.9 10*3/uL (ref 3.9–10.3)
lymph#: 2.2 10*3/uL (ref 0.9–3.3)

## 2014-08-01 LAB — COMPREHENSIVE METABOLIC PANEL (CC13)
ALT: 11 U/L (ref 0–55)
ANION GAP: 8 meq/L (ref 3–11)
AST: 16 U/L (ref 5–34)
Albumin: 3.6 g/dL (ref 3.5–5.0)
Alkaline Phosphatase: 74 U/L (ref 40–150)
BILIRUBIN TOTAL: 0.27 mg/dL (ref 0.20–1.20)
BUN: 13.1 mg/dL (ref 7.0–26.0)
CALCIUM: 9.5 mg/dL (ref 8.4–10.4)
CO2: 26 mEq/L (ref 22–29)
Chloride: 108 mEq/L (ref 98–109)
Creatinine: 0.8 mg/dL (ref 0.6–1.1)
GLUCOSE: 102 mg/dL (ref 70–140)
Potassium: 4 mEq/L (ref 3.5–5.1)
Sodium: 142 mEq/L (ref 136–145)
TOTAL PROTEIN: 7.1 g/dL (ref 6.4–8.3)

## 2014-08-01 MED ORDER — TAMOXIFEN CITRATE 20 MG PO TABS: 20.0000 mg | ORAL_TABLET | Freq: Every day | ORAL | Status: DC

## 2014-08-03 ENCOUNTER — Encounter: Payer: Self-pay | Admitting: Oncology

## 2014-08-03 NOTE — Progress Notes (Signed)
I faxed LTD Forms the [id number]  [phone]

## 2014-08-08 ENCOUNTER — Telehealth: Payer: Self-pay | Admitting: Oncology

## 2014-08-08 ENCOUNTER — Ambulatory Visit (HOSPITAL_BASED_OUTPATIENT_CLINIC_OR_DEPARTMENT_OTHER): Payer: 59 | Admitting: Nurse Practitioner

## 2014-08-08 ENCOUNTER — Encounter: Payer: Self-pay | Admitting: Nurse Practitioner

## 2014-08-08 VITALS — BP 136/77 | HR 89 | Temp 99.3°F | Resp 18 | Ht 67.0 in | Wt 217.3 lb

## 2014-08-08 DIAGNOSIS — I890 Lymphedema, not elsewhere classified: Secondary | ICD-10-CM | POA: Diagnosis not present

## 2014-08-08 DIAGNOSIS — R232 Flushing: Secondary | ICD-10-CM | POA: Insufficient documentation

## 2014-08-08 DIAGNOSIS — C773 Secondary and unspecified malignant neoplasm of axilla and upper limb lymph nodes: Principal | ICD-10-CM

## 2014-08-08 DIAGNOSIS — C50912 Malignant neoplasm of unspecified site of left female breast: Secondary | ICD-10-CM | POA: Diagnosis not present

## 2014-08-08 DIAGNOSIS — F329 Major depressive disorder, single episode, unspecified: Secondary | ICD-10-CM | POA: Diagnosis not present

## 2014-08-08 DIAGNOSIS — C778 Secondary and unspecified malignant neoplasm of lymph nodes of multiple regions: Secondary | ICD-10-CM | POA: Diagnosis not present

## 2014-08-08 DIAGNOSIS — R635 Abnormal weight gain: Secondary | ICD-10-CM | POA: Insufficient documentation

## 2014-08-08 NOTE — [facility] Notes (Signed)
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 A biopsy of the mass in question [facility] showed ([id number]) and invasive lobular carcinoma, which was estrogen and progesterone receptor both 100% positive, both with strong staining intensity, but HER-2 negative at 1+. Bilateral breast MRI 12/13/2009 in [place] showed in the left breast an irregularly marginated mass measuring 3.2 cm. There were 4 or 5 separate satellite lesions laterally and superior to the primary.  Accordingly, after appropriate discussion, the patient underwent bilateral mastectomies with left sentinel lymph node sampling 01/02/2010. The right breast was benign. The left breast showed a 2.3 cm invasive lobular carcinoma, grade 3, with a total of 6 sentinel lymph nodes removed, all negative, although 2 showed isolated tumor cells by immunostaining. Margins were negative.  An Oncotype BX was sent, with a recurrence score of 22, predicting a risk of distant recurrence within 10 years with 14% of the patients only systemic treatment was tamoxifen. The patient was treated with CMF chemotherapy between 02/22/2010 and 07/05/2010, receiving 7 cycles at which point the patient refused further chemotherapy though there have been no major toxicities at according to the oncology note from Dr. Brent General. The patient was then started on tamoxifen 08/09/2010. By her  cancer took approximately 12-15 months then stopped because of hot flashes and insomnia problems.  More recently, the patient noted a change in her left axilla andunderwent bilateral breast MRI January to 2015 at [place]. The patient has bilateral submuscular silicone implants in place. The breast were unremarkable, but there were several lymph nodes with cortical thickening in the left axilla including a dominant 1 measuring 1.2 cm in the short axis. Ultrasonography of this area identified the lymph node in question and the patient underwent biopsy of the left axillary lymph node This showed ([id number]) near-total replacement of the lymph node by metastatic carcinoma. This was 96% estrogen receptor positive, and 84% progesterone receptor positive, both with strong staining intensity. The MIB-1 was 20%. HER-2 determination is pending.  The patient's subsequent history is as detailed below  INTERVAL HISTORY: [name] returns today for follow up of her breast cancer.  She has been on tamoxifen since November 2015 and is tolerating this drug relatively well. She has intense hot flashes and manages these on their own until the pass. She has some stiffness in the morning when she first wakes up, but when she starts moving she feels better. She denies vaginal changes.   REVIEW OF SYSTEMS: [name] has been working out 3-4 times per week but is having trouble losing weight. She denies fevers, chills, nausea, vomiting, or changes in bowel or bladder habits. She has no shortness of breath, chest pain, cough, or palpitations. She has no headaches, dizziness, vision changes, or fatigue. A detailed review of systems is otherwise stable.  PAST MEDICAL HISTORY: Past Medical History  Diagnosis Date  . Cancer     breast ca  . Malignant neoplasm of breast (female), unspecified site   . Radiation 10/24/13-12/09/13  Left chestwall/supraclav./scar  . GERD (gastroesophageal reflux disease)   . Back muscle  spasm     occasional tx with skelaxin    PAST SURGICAL HISTORY: Past Surgical History  Procedure Laterality Date  . Bilateral total mastectomy with axillary lymph node dissection    . Tonsillectomy    . Leep    . Portacath placement N/A 06/16/2013    Procedure: POWER PORT PLACEMENT;  Surgeon: Shann Medal, MD;  Location: [facility];  Service: General;  Laterality: N/A;  . Axillary lymph node dissection Left 06/16/2013    Procedure: LEFT AXILLARY NODE DISSECTION;  Surgeon: Shann Medal, MD;  Location: [facility];  Service: General;  Laterality: Left;  . Breast surgery    . Wisdom tooth extraction    . Right leg surgery      broken femur - has rod in leg  . Laparoscopic bilateral salpingo oopherectomy Bilateral 01/25/2014    Procedure: LAPAROSCOPIC BILATERAL SALPINGO OOPHORECTOMY;  Surgeon: Eldred Manges, MD;  Location: [facility];  Service: Gynecology;  Laterality: Bilateral;  . Port-a-cath removal N/A 01/25/2014    Procedure: REMOVAL PORT-A-CATH;  Surgeon: Alphonsa Overall, MD;  Location: [facility];  Service: General;  Laterality: N/A;    FAMILY HISTORY Family History  Problem Relation Age of Onset  . Other Mother 78    glioblastoma; deceased 63  . Cancer Mother   . Other Father 54    glioblastoma; deceased 107  . Cancer Father   . Hypertension Other    according to the patient both her parents died from a primary brain cancers, namely we have the stomas, her father at 71, her mother at 58. The patient had one brother and one sister. There is no history of breast or ovarian cancer in the family  GYNECOLOGIC HISTORY:   (Reviewed 10/10/2013) Menarche age 81. The patient has never carried a child to term. She was still having regular periods at the time of her recent diagnosis. LMP 07/18/2013 as of 09/08/2013.  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 She shares a home with her significant other, [name], who works at installing car radios and also in [place]: Not in [place]:  (Reviewed 10/10/2013) History  Substance Use Topics  . Smoking status: Never Smoker   . Smokeless tobacco: Never Used  . Alcohol Use: Yes     Comment: socially wine     Colonoscopy: Never  PAP: December 2014  Bone density: Never  Lipid panel: Not on file   Allergies  Allergen Reactions  . Sulfa Antibiotics Hives and Itching  . Gadolinium Derivatives Hives    After gado injection, pt had a few small hives on left breast area. Treated with benadryl by dr Janeece Fitting   . Petrolatum Hives and Rash    Current Outpatient Prescriptions  Medication Sig Dispense Refill  . metaxalone (SKELAXIN) 800 MG tablet Take 1 tablet (800 mg total) by mouth 3 (three) times daily as needed for muscle spasms. 60 tablet 0  . omeprazole (PRILOSEC) 40 MG capsule Take 1 capsule (40 mg total) by mouth daily. 90 capsule 3  . tamoxifen (NOLVADEX) 20 MG tablet Take 1 tablet (20 mg total) by mouth daily. 30 tablet 5  . aspirin-acetaminophen-caffeine (EXCEDRIN MIGRAINE)  250-250-65 MG per tablet Take by mouth every 6 (six) hours as needed for headache.    . ibuprofen (ADVIL,MOTRIN) 600 MG tablet 1  po  pc every 6 hours for 3 days then prn-pain (Patient not taking: Reported on 05/11/2014) 30 tablet 1   No current facility-administered medications for this visit.    OBJECTIVE: Young [place] American woman who appears  tired but is in no acute distress Filed Vitals:   08/08/14 1028  BP: 136/77  Pulse: 89  Temp: 99.3 F (37.4 C)  Resp: 18     Body mass index is 34.03 kg/(m^2).    ECOG FS:1 - Symptomatic but completely ambulatory Filed Weights   08/08/14 1028  Weight: 217 lb 4.8 oz (98.567 kg)   Skin: warm, dry  HEENT: sclerae anicteric, conjunctivae pink,  oropharynx clear. No thrush or mucositis.  Lymph Nodes: No cervical or supraclavicular lymphadenopathy  Lungs: clear to auscultation bilaterally, no rales, wheezes, or rhonci  Heart: regular rate and rhythm  Abdomen: round, soft, non tender, positive bowel sounds  Musculoskeletal: No focal spinal tenderness, grade 1 left upper extremity lymphedema in sleeve Neuro: non focal, well oriented, positive affect  Breasts: bilateral breasts status post mastectomies and implant reconstruction. Left reconstruction more tight and hyperpigmented status post radiation. No evidence of recurrent disease. Bilateral axillae benign.   LAB RESULTS:   Lab Results  Component Value Date   WBC 4.9 08/01/2014   NEUTROABS 2.1 08/01/2014   HGB 12.0 08/01/2014   HCT 37.5 08/01/2014   MCV 88.4 08/01/2014   PLT 197 08/01/2014      Chemistry      Component Value Date/Time   NA 142 08/01/2014 0924   NA 135* 07/16/2013 0535   K 4.0 08/01/2014 0924   K 4.4 07/16/2013 0535   CL 97 07/16/2013 0535   CO2 26 08/01/2014 0924   CO2 25 07/16/2013 0535   BUN 13.1 08/01/2014 0924   BUN 11 07/16/2013 0535   CREATININE 0.8 08/01/2014 0924   CREATININE 0.81 07/16/2013 0535      Component Value Date/Time   CALCIUM 9.5 08/01/2014 0924   CALCIUM 10.1 07/16/2013 0535   ALKPHOS 74 08/01/2014 0924   ALKPHOS 92 07/16/2013 0535   AST 16 08/01/2014 0924   AST 16 07/16/2013 0535   ALT 11 08/01/2014 0924   ALT 14 07/16/2013 0535   BILITOT 0.27 08/01/2014 0924   BILITOT 0.9 07/16/2013 0535      STUDIES: No results found.   ASSESSMENT: 39 y.o. BRCA negative [name] woman  (1) status post bilateral mastectomies with left axillary lymph node sampling [6 nodes removed] 01/02/2010 for a pT2 pN0(i+), stage IIA invasive lobular breast cancer, grade 3, estrogen and progesterone receptor positive, HER-2 not amplified; right breast was benign  (2) Oncotype DX score of 22 predicted a 14% risk of distant recurrence within  10 years if the patient's only systemic treatment is tamoxifen for 5 years  (3) status post CMF(cyclophosphamide, fluorouracil, methotrexate) x7 given between October of 2011 and March of 2012 (7 of 8 planned treatments completed)  (4) tamoxifen started April 2012, discontinued approximately July of 2013 (about 15 months) because of problems with hot flashes and insomnia  (5) pathologically documented left axillary recurrence 05/05/2013, the tumor being again estrogen and progesterone receptor positive, with HER-2 not amplified, and MIB-1 of 15%. Staging CT/PET 05/27/2013 show left axillary and supraclavicular recurrence, no distant disease  (6) status post completion left axillary lymph node dissection 06/16/2013 showing a  total of 10/15 lymph nodes involved by tumor, with evidence of extracapsular extension (TX N3 = stage IIIC)  (7) treated with carboplatin/ docetaxel, first dose on 07/11/2013, repeated every 21 days x 4 with Neulasta support on day 2.  Final dose was given on 09/12/2013, with chemotherapy discontinued after 4 cycles due to continuing low counts.  (8) radiation completed 12/09/2013: Left chest wall / 50.4 Gray @ 1.8 Gray per fraction x 28 fractions Left Supraclavicular fossa and PAB/ 45 Gray @1 .8 Gray per fraction x 25 fractions Left scar / 10 Gray at [employer] per fraction x 5 fractions   (9) bilateral salpingo-oophorectomy on 01/25/14  (10)   lymphedema and limited range of motion in the left upper extremity secondary to left axillary lymph node dissection, being treated through the lymphedema clinic  - improved   (13) sequencing and deletion/duplication analysis of 17 genes performed September 2015 at [facility] did not reveal any deleterious mutation in  ATM, BARD1, BRCA1, BRCA2, BRIP1, CDH1, CHEK2, MRE11A, MUTYH, NBN, NF1, PALB2, PTEN, RAD50, RAD51C, RAD51D, and TP53.  (14) tamoxifen started 02/27/2014  PLAN: Durenda looks and feels well today. The labs were reviewed  in detail and were entirely stable. She is tolerating the tamoxifen well and will continue this with the goal of 10 years of antiestrogen therapy. We discussed her hot flashes and at this time she prefers to manage these on her own.   We discussed weight gain and exercise. She has a good practice so far of being in the gym 3-4 times weekly. As a young woman she could stand to focus more on weight lifting vs just cardio, as muscle burns more calories at rest than fat. She will continue to wear her compression sleeve and perform the various PT exercises to stretch out her left chest.  Macaila is already scheduled for a follow up visit this September. She understands and agrees with this plan. She knows the goal of treatment in her case is cure. She has been encouraged to call with any issues that might arise before her next visit here.   Laurie Panda, NP   08/08/2014 3:15 PM

## 2014-08-08 NOTE — Telephone Encounter (Signed)
Appointments moved and avs printed for patient

## 2014-08-09 NOTE — Addendum Note (Signed)
Addended by: Marcelino Duster on: 08/09/2014 01:41 PM   Modules accepted: Orders

## 2014-08-17 ENCOUNTER — Encounter: Payer: Self-pay | Admitting: Oncology

## 2014-08-17 NOTE — Progress Notes (Signed)
I placed forms from the [facility] on the desk of nurse for dr. Jana Hakim.

## 2014-08-23 ENCOUNTER — Encounter: Payer: Self-pay | Admitting: Oncology

## 2014-08-23 ENCOUNTER — Telehealth: Payer: Self-pay | Admitting: *Deleted

## 2014-08-23 NOTE — Progress Notes (Signed)
The patient wants copy mailed to her. See prev notes.

## 2014-08-23 NOTE — Progress Notes (Signed)
I called and left a message for patient to let her know I faxed ltd forms to the [facility] and to call me if she wants copy for records. If so I will mail and I advised her to confirm her address.

## 2014-08-23 NOTE — Telephone Encounter (Signed)
TC from patient regarding her [employer] papers. She has questions about them please return her call @ [phone]. Thank you

## 2014-09-18 ENCOUNTER — Telehealth: Payer: Self-pay | Admitting: *Deleted

## 2014-09-18 NOTE — Telephone Encounter (Signed)
Val, perhaps [facility] can see her tomorrow...  GM

## 2014-09-18 NOTE — Telephone Encounter (Signed)
This RN spoke with pt per MD request and offerred to schedule an appointment for tomorrow.  [name] states she cannot come in tomorrow due to " I have to get my car from the dealership"  [name] is inquiring if she can come in Wednesday-appointment made with HB/NP.

## 2014-09-18 NOTE — Telephone Encounter (Signed)
"  Please call me about stuff going on I need to speak to someone about."  Called patient.  "I need to let Dr. Jana Hakim know I'm depressed and in a bad place right now.  I haven't felt this bad since my mom dies in Aug 04, 2012.  I was taking Skelaxin for mood swings in the past.  It's a worthless feeling I can't describe.  I am depressed and go from crying to happy to wondering what if my cancer comes back."  Admits to going to [employer] four times a week for an hour.  This "started when I finished Chemotherapy but has been worse the past three weeks.  Denies wanting to hurt herself and the relationship with her live in boyfriend is good.  Unable to recall name of PCP and hasn't seen Earnstine Regal since surgery.  Tamoxifen since November 2015.  Will notify Dr. Jana Hakim and encouraged to notify PCP.

## 2014-09-20 ENCOUNTER — Telehealth: Payer: Self-pay | Admitting: Nurse Practitioner

## 2014-09-20 ENCOUNTER — Encounter: Payer: Self-pay | Admitting: Nurse Practitioner

## 2014-09-20 ENCOUNTER — Other Ambulatory Visit: Payer: Self-pay | Admitting: *Deleted

## 2014-09-20 ENCOUNTER — Ambulatory Visit (HOSPITAL_BASED_OUTPATIENT_CLINIC_OR_DEPARTMENT_OTHER): Payer: 59 | Admitting: Nurse Practitioner

## 2014-09-20 VITALS — BP 122/86 | HR 92 | Temp 97.1°F | Resp 18 | Ht 67.0 in | Wt 217.6 lb

## 2014-09-20 DIAGNOSIS — C50912 Malignant neoplasm of unspecified site of left female breast: Secondary | ICD-10-CM

## 2014-09-20 DIAGNOSIS — R232 Flushing: Secondary | ICD-10-CM

## 2014-09-20 DIAGNOSIS — I890 Lymphedema, not elsewhere classified: Secondary | ICD-10-CM | POA: Diagnosis not present

## 2014-09-20 DIAGNOSIS — R229 Localized swelling, mass and lump, unspecified: Secondary | ICD-10-CM | POA: Diagnosis not present

## 2014-09-20 DIAGNOSIS — C773 Secondary and unspecified malignant neoplasm of axilla and upper limb lymph nodes: Principal | ICD-10-CM

## 2014-09-20 DIAGNOSIS — F4310 Post-traumatic stress disorder, unspecified: Secondary | ICD-10-CM

## 2014-09-20 DIAGNOSIS — F329 Major depressive disorder, single episode, unspecified: Secondary | ICD-10-CM

## 2014-09-20 DIAGNOSIS — C778 Secondary and unspecified malignant neoplasm of lymph nodes of multiple regions: Secondary | ICD-10-CM

## 2014-09-20 DIAGNOSIS — L989 Disorder of the skin and subcutaneous tissue, unspecified: Secondary | ICD-10-CM | POA: Insufficient documentation

## 2014-09-20 DIAGNOSIS — F32A Depression, unspecified: Secondary | ICD-10-CM

## 2014-09-20 MED ORDER — OMEPRAZOLE 40 MG PO CPDR: 40.0000 mg | DELAYED_RELEASE_CAPSULE | Freq: Every day | ORAL | Status: DC

## 2014-09-20 MED ORDER — VENLAFAXINE HCL ER 75 MG PO CP24: 75.0000 mg | ORAL_CAPSULE | Freq: Every day | ORAL | Status: DC

## 2014-09-20 NOTE — Telephone Encounter (Signed)
Appointments made and avs printed for patient,[facility] ins.,the patient and heather are aware and heather will advise

## 2014-09-20 NOTE — Progress Notes (Signed)
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 A biopsy of the mass in question [facility] showed ([id number]) and invasive lobular carcinoma, which was estrogen and progesterone receptor both 100% positive, both with strong staining intensity, but HER-2 negative at 1+. Bilateral breast MRI 12/13/2009 in [place] showed in the left breast an irregularly marginated mass measuring 3.2 cm. There were 4 or 5 separate satellite lesions laterally and superior to the primary.  Accordingly, after appropriate discussion, the patient underwent bilateral mastectomies with left sentinel lymph node sampling 01/02/2010. The right breast was benign. The left breast showed a 2.3 cm invasive lobular carcinoma, grade 3, with a total of 6 sentinel lymph nodes removed, all negative, although 2 showed isolated tumor cells by immunostaining. Margins were negative.  An Oncotype BX was sent, with a recurrence score of 22, predicting a risk of distant recurrence within 10 years with 14% of the patients only systemic treatment was tamoxifen. The patient was treated with CMF chemotherapy between 02/22/2010 and 07/05/2010, receiving 7 cycles at which point the patient refused further chemotherapy though there have been no major toxicities at according to the oncology note from Dr. Brent General. The patient was then started on tamoxifen 08/09/2010. By  her cancer took approximately 12-15 months then stopped because of hot flashes and insomnia problems.  More recently, the patient noted a change in her left axilla andunderwent bilateral breast MRI January to 2015 at [place]. The patient has bilateral submuscular silicone implants in place. The breast were unremarkable, but there were several lymph nodes with cortical thickening in the left axilla including a dominant 1 measuring 1.2 cm in the short axis. Ultrasonography of this area identified the lymph node in question and the patient underwent biopsy of the left axillary lymph node This showed ([id number]) near-total replacement of the lymph node by metastatic carcinoma. This was 96% estrogen receptor positive, and 84% progesterone receptor positive, both with strong staining intensity. The MIB-1 was 20%. HER-2 determination is pending.  The patient's subsequent history is as detailed below  INTERVAL HISTORY: [name] returns today for follow up of her breast cancer. She is here, despite having been seen just last month. Recently she has experienced worthlessness, depression, and mood swings. She has anxiety about her diagnosis as well. She denies suicidal or homicidal ideations.  REVIEW OF SYSTEMS: She has been on tamoxifen since November 2015 and is tolerating this drug relatively well. She has intense hot flashes and manages these on her own until they pass. She has some stiffness in the morning when she first wakes up, but when she starts moving she feels better. She denies vaginal changes. [name] has been working out 3-4 times per week but is having trouble losing weight. She denies fevers, chills, nausea, vomiting, or changes in bowel or bladder habits. She has no shortness of breath, chest pain, cough, or palpitations. She has no headaches, dizziness, vision changes, or fatigue. A detailed review of systems is otherwise stable.  PAST MEDICAL HISTORY: Past Medical History  Diagnosis  Date  . Cancer     breast ca  . Malignant neoplasm of breast (female), unspecified site   . Radiation 10/24/13-12/09/13    Left chestwall/supraclav./scar  . GERD (gastroesophageal reflux disease)   . Back muscle spasm     occasional tx with skelaxin    PAST SURGICAL HISTORY: Past Surgical History  Procedure Laterality Date  . Bilateral total mastectomy with axillary lymph node dissection    . Tonsillectomy    . Leep    . Portacath placement N/A 06/16/2013    Procedure: POWER PORT PLACEMENT;  Surgeon: Shann Medal, MD;  Location: [facility];  Service: General;  Laterality: N/A;  . Axillary lymph node dissection Left 06/16/2013    Procedure: LEFT AXILLARY NODE DISSECTION;  Surgeon: Shann Medal, MD;  Location: [facility];  Service: General;  Laterality: Left;  . Breast surgery    . Wisdom tooth extraction    . Right leg surgery      broken femur - has rod in leg  . Laparoscopic bilateral salpingo oopherectomy Bilateral 01/25/2014    Procedure: LAPAROSCOPIC BILATERAL SALPINGO OOPHORECTOMY;  Surgeon: Eldred Manges, MD;  Location: [facility];  Service: Gynecology;  Laterality: Bilateral;  . Port-a-cath removal N/A 01/25/2014    Procedure: REMOVAL PORT-A-CATH;  Surgeon: Alphonsa Overall, MD;  Location: [facility];  Service: General;  Laterality: N/A;    FAMILY HISTORY Family History  Problem Relation Age of Onset  . Other Mother 10    glioblastoma; deceased 47  . Cancer Mother   . Other Father 69    glioblastoma; deceased 62  . Cancer Father   . Hypertension Other    according to the patient both her parents died from a primary brain cancers, namely we have the stomas, her father at 11, her mother at 22. The patient had one brother and one sister. There is no history of breast or ovarian cancer in the family  GYNECOLOGIC HISTORY:   (Reviewed 10/10/2013) Menarche age 76. The patient has never carried a child to term. She was still having regular periods at the time of her recent diagnosis. LMP  07/18/2013 as of 09/08/2013.  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 She shares a home with her significant other, [name], who works at installing car radios and also in [place]: Not in [place]:  (Reviewed 10/10/2013) History  Substance Use Topics  . Smoking status: Never Smoker   . Smokeless tobacco: Never Used  . Alcohol Use: Yes     Comment: socially wine     Colonoscopy: Never  PAP: December 2014  Bone density: Never  Lipid panel: Not on file   Allergies  Allergen Reactions  . Sulfa Antibiotics Hives and Itching  . Gadolinium Derivatives Hives    After gado injection, pt had a few small hives on left breast area. Treated with benadryl by dr Janeece Fitting   . Petrolatum Hives and Rash    Current Outpatient Prescriptions  Medication Sig Dispense Refill  . aspirin-acetaminophen-caffeine (EXCEDRIN MIGRAINE) 250-250-65 MG per tablet Take by mouth every 6 (six) hours as needed for headache.    . metaxalone (SKELAXIN) 800 MG tablet Take 1 tablet (800 mg total)  by mouth 3 (three) times daily as needed for muscle spasms. 60 tablet 0  . tamoxifen (NOLVADEX) 20 MG tablet Take 1 tablet (20 mg total) by mouth daily. 30 tablet 5  . ibuprofen (ADVIL,MOTRIN) 600 MG tablet 1  po  pc every 6 hours for 3 days then prn-pain (Patient not taking: Reported on 05/11/2014) 30 tablet 1  . omeprazole (PRILOSEC) 40 MG capsule Take 1 capsule (40 mg total) by mouth daily. 90 capsule 3  . venlafaxine XR (EFFEXOR-XR) 75 MG 24 hr capsule Take 1 capsule (75 mg total) by mouth daily with breakfast. 30 capsule 3   No current facility-administered medications for this visit.    OBJECTIVE: Young [place] American woman who appears  tired but is in  no acute distress Filed Vitals:   09/20/14 1427  BP: 122/86  Pulse:   Temp:   Resp:      Body mass index is 34.07 kg/(m^2).    ECOG FS:1 - Symptomatic but completely ambulatory Filed Weights   09/20/14 1343  Weight: 217 lb 9.6 oz (98.703 kg)   Skin: warm, dry  HEENT: sclerae anicteric, conjunctivae pink, oropharynx clear. No thrush or mucositis.  Lymph Nodes: No cervical or supraclavicular lymphadenopathy  Lungs: clear to auscultation bilaterally, no rales, wheezes, or rhonci  Heart: regular rate and rhythm  Abdomen: obese, soft, non tender, positive bowel sounds  Musculoskeletal: No focal spinal tenderness, grade 1 left upper extremity lymphedema in sleeve Neuro: non focal, well oriented, positive affect  Breasts: breast exam deferred. 1 cm firm nodule just to left of sternum.  LAB RESULTS:   Lab Results  Component Value Date   WBC 4.9 08/01/2014   NEUTROABS 2.1 08/01/2014   HGB 12.0 08/01/2014   HCT 37.5 08/01/2014   MCV 88.4 08/01/2014   PLT 197 08/01/2014      Chemistry      Component Value Date/Time   NA 142 08/01/2014 0924   NA 135* 07/16/2013 0535   K 4.0 08/01/2014 0924   K 4.4 07/16/2013 0535   CL 97 07/16/2013 0535   CO2 26 08/01/2014 0924   CO2 25 07/16/2013 0535   BUN 13.1 08/01/2014 0924   BUN 11 07/16/2013 0535   CREATININE 0.8 08/01/2014 0924   CREATININE 0.81 07/16/2013 0535      Component Value Date/Time   CALCIUM 9.5 08/01/2014 0924   CALCIUM 10.1 07/16/2013 0535   ALKPHOS 74 08/01/2014 0924   ALKPHOS 92 07/16/2013 0535   AST 16 08/01/2014 0924   AST 16 07/16/2013 0535   ALT 11 08/01/2014 0924   ALT 14 07/16/2013 0535   BILITOT 0.27 08/01/2014 0924   BILITOT 0.9 07/16/2013 0535      STUDIES: No results found.   ASSESSMENT: 39 y.o. BRCA negative [place] woman  (1) status post bilateral mastectomies with left axillary lymph node sampling [6 nodes removed] 01/02/2010 for a pT2 pN0(i+), stage IIA invasive lobular breast cancer,  grade 3, estrogen and progesterone receptor positive, HER-2 not amplified; right breast was benign  (2) Oncotype DX score of 22 predicted a 14% risk of distant recurrence within 10 years if the patient's only systemic treatment is tamoxifen for 5 years  (3) status post CMF(cyclophosphamide, fluorouracil, methotrexate) x7 given between October of 2011 and March of 2012 (7 of 8 planned treatments completed)  (4) tamoxifen started April 2012, discontinued approximately July of 2013 (about 15 months) because of problems with hot flashes and insomnia  (5) pathologically documented left axillary recurrence 05/05/2013,  the tumor being again estrogen and progesterone receptor positive, with HER-2 not amplified, and MIB-1 of 15%. Staging CT/PET 05/27/2013 show left axillary and supraclavicular recurrence, no distant disease  (6) status post completion left axillary lymph node dissection 06/16/2013 showing a total of 10/15 lymph nodes involved by tumor, with evidence of extracapsular extension (TX N3 = stage IIIC)  (7) treated with carboplatin/ docetaxel, first dose on 07/11/2013, repeated every 21 days x 4 with Neulasta support on day 2.  Final dose was given on 09/12/2013, with chemotherapy discontinued after 4 cycles due to continuing low counts.  (8) radiation completed 12/09/2013: Left chest wall / 50.4 Gray @ 1.8 Gray per fraction x 28 fractions Left Supraclavicular fossa and PAB/ 45 Gray _0 .8 Gray per fraction x 25 fractions Left scar / 10 Gray at [employer] per fraction x 5 fractions   (9) bilateral salpingo-oophorectomy on 01/25/14  (10)   lymphedema and limited range of motion in the left upper extremity secondary to left axillary lymph node dissection, being treated through the lymphedema clinic  - improved   (13) sequencing and deletion/duplication analysis of 17 genes performed September 2015 at [facility] did not reveal any deleterious mutation in  ATM, BARD1, BRCA1, BRCA2, BRIP1, CDH1,  CHEK2, MRE11A, MUTYH, NBN, NF1, PALB2, PTEN, RAD50, RAD51C, RAD51D, and TP53.  (14) tamoxifen started 02/27/2014  PLAN: [name] and I discussed her recent change in mood, and it seems that she is suffering from post traumatic stress syndrome. A number of our patients experience this after completing the bulk of their cancer treatments, and "the dust settles." We discussed attending the Finding Your New Normal seminars and performing calming exercises such as yoga, which we also offer here at the cancer center for free. She is very interested and was given a calendar of the upcoming events.   [name] is also interested in antidepressant therapy. We are going to start her out on 44m venlafaxine daily. She knows she will take 2 weeks or more to experience any changes, and that she is not to stop this drug abruptly. This should also help with her hot flashes. She will call 911 for any suicidal or homicidal ideations  For the nodule to her midchest, I am sending her to the breast center for an ultrasound.  AJasmanwill return in 6 weeks for evaluation for tolerance of this drug. She understands and agrees with this plan. She has been encouraged to call with any issues that might arise before her next visit here.  HLaurie Panda NP   09/20/2014 3:20 PM

## 2014-10-02 ENCOUNTER — Telehealth: Payer: Self-pay | Admitting: Nurse Practitioner

## 2014-10-02 NOTE — Telephone Encounter (Signed)
Called patient with ultrasound order appointment

## 2014-10-04 ENCOUNTER — Ambulatory Visit
Admission: RE | Admit: 2014-10-04 | Discharge: 2014-10-04 | Disposition: A | Discharge status: Home / Self Care | Payer: 59 | Source: Ambulatory Visit | Attending: Nurse Practitioner | Admitting: Nurse Practitioner

## 2014-10-04 DIAGNOSIS — C50912 Malignant neoplasm of unspecified site of left female breast: Secondary | ICD-10-CM

## 2014-10-04 DIAGNOSIS — C773 Secondary and unspecified malignant neoplasm of axilla and upper limb lymph nodes: Principal | ICD-10-CM

## 2014-10-31 ENCOUNTER — Telehealth: Payer: Self-pay | Admitting: *Deleted

## 2014-10-31 NOTE — Telephone Encounter (Signed)
PT. THOUGHT SHE HAD AN APPOINTMENT FOR LAB TOMORROW. CHECKED PT.'S SCHEDULED. SHE IS TO SEE HEATHER BOELTER,NP TOMORROW. NOTIFIED PT. OF HER APPOINTMENT WITH HEATHER TOMORROW. PT. WILL ADDRESS THE ISSUES OF THE KNOT ON HER CHEST AND THE NEED TO INCREASE THE EFFEXOR.

## 2014-11-01 ENCOUNTER — Ambulatory Visit (HOSPITAL_BASED_OUTPATIENT_CLINIC_OR_DEPARTMENT_OTHER): Payer: 59 | Admitting: Nurse Practitioner

## 2014-11-01 ENCOUNTER — Other Ambulatory Visit: Payer: Self-pay | Admitting: *Deleted

## 2014-11-01 ENCOUNTER — Encounter: Payer: Self-pay | Admitting: Nurse Practitioner

## 2014-11-01 VITALS — BP 130/85 | HR 68 | Temp 98.8°F | Resp 18 | Ht 67.0 in | Wt 221.3 lb

## 2014-11-01 DIAGNOSIS — R635 Abnormal weight gain: Secondary | ICD-10-CM | POA: Diagnosis not present

## 2014-11-01 DIAGNOSIS — C773 Secondary and unspecified malignant neoplasm of axilla and upper limb lymph nodes: Secondary | ICD-10-CM

## 2014-11-01 DIAGNOSIS — F32A Depression, unspecified: Secondary | ICD-10-CM

## 2014-11-01 DIAGNOSIS — C50912 Malignant neoplasm of unspecified site of left female breast: Secondary | ICD-10-CM

## 2014-11-01 DIAGNOSIS — F329 Major depressive disorder, single episode, unspecified: Secondary | ICD-10-CM | POA: Diagnosis not present

## 2014-11-01 DIAGNOSIS — L989 Disorder of the skin and subcutaneous tissue, unspecified: Secondary | ICD-10-CM

## 2014-11-01 MED ORDER — VENLAFAXINE HCL ER 150 MG PO TB24: 1.0000 | ORAL_TABLET | Freq: Every day | ORAL | Status: DC

## 2014-11-01 NOTE — Progress Notes (Signed)
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 A biopsy of the mass in question [facility] showed ([id number]) and invasive lobular carcinoma, which was estrogen and progesterone receptor both 100% positive, both with strong staining intensity, but HER-2 negative at 1+. Bilateral breast MRI 12/13/2009 in [place] showed in the left breast an irregularly marginated mass measuring 3.2 cm. There were 4 or 5 separate satellite lesions laterally and superior to the primary.  Accordingly, after appropriate discussion, the patient underwent bilateral mastectomies with left sentinel lymph node sampling 01/02/2010. The right breast was benign. The left breast showed a 2.3 cm invasive lobular carcinoma, grade 3, with a total of 6 sentinel lymph nodes removed, all negative, although 2 showed isolated tumor cells by immunostaining. Margins were negative.  An Oncotype BX was sent, with a recurrence score of 22, predicting a risk of distant recurrence within 10 years with 14% of the patients only systemic treatment was tamoxifen. The patient was treated with CMF chemotherapy between 02/22/2010 and 07/05/2010, receiving 7 cycles at which point the patient refused further chemotherapy though there have been no major toxicities at according to the oncology note from Dr. Brent General. The patient was then started on tamoxifen 08/09/2010. By  her cancer took approximately 12-15 months then stopped because of hot flashes and insomnia problems.  More recently, the patient noted a change in her left axilla andunderwent bilateral breast MRI January to 2015 at [place]. The patient has bilateral submuscular silicone implants in place. The breast were unremarkable, but there were several lymph nodes with cortical thickening in the left axilla including a dominant 1 measuring 1.2 cm in the short axis. Ultrasonography of this area identified the lymph node in question and the patient underwent biopsy of the left axillary lymph node This showed ([id number]) near-total replacement of the lymph node by metastatic carcinoma. This was 96% estrogen receptor positive, and 84% progesterone receptor positive, both with strong staining intensity. The MIB-1 was 20%. HER-2 determination is pending.  The patient's subsequent history is as detailed below  INTERVAL HISTORY: Belkys returns today for follow up of her breast cancer. [facility] for an ultrasound of the nodule to her sternum. It was determined that this was a suspicious lesion and needs to be biopsied. At her last visit she was started on venlafaxine 30m. She started taking 1.5 tablets at some point and feels like she has seen some improvement. She is still depressed about her weight. She is doing cardio 3-4 times weekly and eating well, but has actually gained 4lb since her last visit.  REVIEW OF SYSTEMS: AKeylidenies fevers, chills, nausea, vomiting, or changes in bowel or bladder habits. She is tolerating tamoxifen well. Her hot flashes are improved, but she still has hand stiffness in the morning that improves with time and movement. She denies vaginal changes. She has no shortness of breath, chest pain, cough, or palpitations. She has no headaches, dizziness, vision changes, or  fatigue. A detailed review of systems is otherwise stable.   PAST MEDICAL  HISTORY: Past Medical History  Diagnosis Date  . Cancer     breast ca  . Malignant neoplasm of breast (female), unspecified site   . Radiation 10/24/13-12/09/13    Left chestwall/supraclav./scar  . GERD (gastroesophageal reflux disease)   . Back muscle spasm     occasional tx with skelaxin    PAST SURGICAL HISTORY: Past Surgical History  Procedure Laterality Date  . Bilateral total mastectomy with axillary lymph node dissection    . Tonsillectomy    . Leep    . Portacath placement N/A 06/16/2013    Procedure: POWER PORT PLACEMENT;  Surgeon: Shann Medal, MD;  Location: [facility];  Service: General;  Laterality: N/A;  . Axillary lymph node dissection Left 06/16/2013    Procedure: LEFT AXILLARY NODE DISSECTION;  Surgeon: Shann Medal, MD;  Location: [facility];  Service: General;  Laterality: Left;  . Breast surgery    . Wisdom tooth extraction    . Right leg surgery      broken femur - has rod in leg  . Laparoscopic bilateral salpingo oopherectomy Bilateral 01/25/2014    Procedure: LAPAROSCOPIC BILATERAL SALPINGO OOPHORECTOMY;  Surgeon: Eldred Manges, MD;  Location: [facility];  Service: Gynecology;  Laterality: Bilateral;  . Port-a-cath removal N/A 01/25/2014    Procedure: REMOVAL PORT-A-CATH;  Surgeon: Alphonsa Overall, MD;  Location: [facility];  Service: General;  Laterality: N/A;    FAMILY HISTORY Family History  Problem Relation Age of Onset  . Other Mother 87    glioblastoma; deceased 58  . Cancer Mother   . Other Father 63    glioblastoma; deceased 75  . Cancer Father   . Hypertension Other    according to the patient both her parents died from a primary brain cancers, namely we have the stomas, her father at 59, her mother at 55. The patient had one brother and one sister. There is no history of breast or ovarian cancer in the family  GYNECOLOGIC HISTORY:   (Reviewed 10/10/2013) Menarche age 39. The patient has never carried a child to term. She was still having regular periods  at the time of her recent diagnosis. LMP 07/18/2013 as of 09/08/2013.  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 She shares a home with her significant other, Burnice Logan, who works at installing car radios and also in [place]: Not in [place]:  (Reviewed 10/10/2013) History  Substance Use Topics  . Smoking status: Never Smoker   . Smokeless tobacco: Never Used  . Alcohol Use: Yes     Comment: socially wine     Colonoscopy: Never  PAP: December 2014  Bone density: Never  Lipid panel: Not on file   Allergies  Allergen Reactions  . Sulfa Antibiotics Hives and Itching  . Gadolinium Derivatives Hives    After gado injection, pt had a few small hives on left breast area. Treated with benadryl by dr Janeece Fitting   . Petrolatum Hives and Rash    Current Outpatient Prescriptions  Medication Sig Dispense Refill  . aspirin-acetaminophen-caffeine (EXCEDRIN MIGRAINE) 250-250-65 MG per tablet Take by mouth every 6 (six) hours as needed for headache.    Marland Kitchen  metaxalone (SKELAXIN) 800 MG tablet Take 1 tablet (800 mg total) by mouth 3 (three) times daily as needed for muscle spasms. 60 tablet 0  . omeprazole (PRILOSEC) 40 MG capsule Take 1 capsule (40 mg total) by mouth daily. 90 capsule 3  . tamoxifen (NOLVADEX) 20 MG tablet Take 1 tablet (20 mg total) by mouth daily. 30 tablet 5  . ibuprofen (ADVIL,MOTRIN) 600 MG tablet 1  po  pc every 6 hours for 3 days then prn-pain (Patient not taking: Reported on 05/11/2014) 30 tablet 1  . Venlafaxine HCl 150 MG TB24 Take 1 tablet (150 mg total) by mouth daily. 30 each 11   No current facility-administered medications for this visit.    OBJECTIVE: Young [place] American woman who appears  tired but  is in no acute distress Filed Vitals:   11/01/14 0947  BP: 130/85  Pulse: 68  Temp: 98.8 F (37.1 C)  Resp: 18     Body mass index is 34.65 kg/(m^2).    ECOG FS:1 - Symptomatic but completely ambulatory Filed Weights   11/01/14 0947  Weight: 221 lb 4.8 oz (100.381 kg)   Skin: warm, dry  HEENT: sclerae anicteric, conjunctivae pink, oropharynx clear. No thrush or mucositis.  Lymph Nodes: No cervical or supraclavicular lymphadenopathy  Lungs: clear to auscultation bilaterally, no rales, wheezes, or rhonci  Heart: regular rate and rhythm  Abdomen: obese, soft, non tender, positive bowel sounds  Musculoskeletal: No focal spinal tenderness, grade 1 left upper extremity lymphedema in sleeve Neuro: non focal, well oriented, positive affect  Breasts: breast exam deferred. 1 cm firm nodule just to left of sternum.  LAB RESULTS:   Lab Results  Component Value Date   WBC 4.9 08/01/2014   NEUTROABS 2.1 08/01/2014   HGB 12.0 08/01/2014   HCT 37.5 08/01/2014   MCV 88.4 08/01/2014   PLT 197 08/01/2014      Chemistry      Component Value Date/Time   NA 142 08/01/2014 0924   NA 135* 07/16/2013 0535   K 4.0 08/01/2014 0924   K 4.4 07/16/2013 0535   CL 97 07/16/2013 0535   CO2 26 08/01/2014 0924   CO2 25 07/16/2013 0535   BUN 13.1 08/01/2014 0924   BUN 11 07/16/2013 0535   CREATININE 0.8 08/01/2014 0924   CREATININE 0.81 07/16/2013 0535      Component Value Date/Time   CALCIUM 9.5 08/01/2014 0924   CALCIUM 10.1 07/16/2013 0535   ALKPHOS 74 08/01/2014 0924   ALKPHOS 92 07/16/2013 0535   AST 16 08/01/2014 0924   AST 16 07/16/2013 0535   ALT 11 08/01/2014 0924   ALT 14 07/16/2013 0535   BILITOT 0.27 08/01/2014 0924   BILITOT 0.9 07/16/2013 0535      STUDIES: US Breast Ltd Uni Left Inc Axilla  10/04/2014   CLINICAL DATA:  The patient has a history of grade 3 left breast invasive lobular carcinoma in 2011. She underwent bilateral mastectomies at that time as well as adjuvant  chemotherapy (CMF). Six sentinel lymph nodes were removed with isolated tumor cells noted within 2 lymph nodes. She developed left axillary lymph node metastases in January of 2015 (estrogen receptor positive). She is on tamoxifen. She recently has noted palpable subcutaneous nodules in her anterior chest wall region on the left, lower left cervical region, and right upper extremity.  EXAM: ULTRASOUND OF THE LEFT CHEST  COMPARISON:  None  FINDINGS: On physical exam, there is a firm superficial palpable subdermal nodule located  in a left parasternal location which by physical examination measures approximately 6 mm in size.  Targeted ultrasound is performed, showing a solid, round, hypoechoic subdermal mass located in the left parasternal location corresponding to the palpable finding measuring 6 x 6 x 6 mm in size. Given the patient's history, this is worrisome for possible recurrence. This could also represent a relatively echogenic appearing sebaceous cyst. Given its superficial, subdermal location, surgical excision is recommended.  IMPRESSION: 6 mm solid subdermal mass located in a left parasternal location. Surgical excision is recommended.  RECOMMENDATION: Surgical excision of palpable left parasternal subdermal mass.  I have discussed the findings and recommendations with the patient. Results were also provided in writing at the conclusion of the visit. If applicable, a reminder letter will be sent to the patient regarding the next appointment.  BI-RADS CATEGORY  4: Suspicious.   Electronically Signed   By: Altamese Cabal M.D.   On: 10/04/2014 11:51     ASSESSMENT: 39 y.o. BRCA negative [place] woman  (1) status post bilateral mastectomies with left axillary lymph node sampling [6 nodes removed] 01/02/2010 for a pT2 pN0(i+), stage IIA invasive lobular breast cancer, grade 3, estrogen and progesterone receptor positive, HER-2 not amplified; right breast was benign  (2) Oncotype DX score of 22  predicted a 14% risk of distant recurrence within 10 years if the patient's only systemic treatment is tamoxifen for 5 years  (3) status post CMF(cyclophosphamide, fluorouracil, methotrexate) x7 given between October of 2011 and March of 2012 (7 of 8 planned treatments completed)  (4) tamoxifen started April 2012, discontinued approximately July of 2013 (about 15 months) because of problems with hot flashes and insomnia  (5) pathologically documented left axillary recurrence 05/05/2013, the tumor being again estrogen and progesterone receptor positive, with HER-2 not amplified, and MIB-1 of 15%. Staging CT/PET 05/27/2013 show left axillary and supraclavicular recurrence, no distant disease  (6) status post completion left axillary lymph node dissection 06/16/2013 showing a total of 10/15 lymph nodes involved by tumor, with evidence of extracapsular extension (TX N3 = stage IIIC)  (7) treated with carboplatin/ docetaxel, first dose on 07/11/2013, repeated every 21 days x 4 with Neulasta support on day 2.  Final dose was given on 09/12/2013, with chemotherapy discontinued after 4 cycles due to continuing low counts.  (8) radiation completed 12/09/2013: Left chest wall / 50.4 Gray @ 1.8 Gray per fraction x 28 fractions Left Supraclavicular fossa and PAB/ 45 Gray _0 .8 Gray per fraction x 25 fractions Left scar / 10 Gray at [employer] per fraction x 5 fractions   (9) bilateral salpingo-oophorectomy on 01/25/14  (10)   lymphedema and limited range of motion in the left upper extremity secondary to left axillary lymph node dissection, being treated through the lymphedema clinic  - improved   (13) sequencing and deletion/duplication analysis of 17 genes performed September 2015 at [facility] did not reveal any deleterious mutation in  ATM, BARD1, BRCA1, BRCA2, BRIP1, CDH1, CHEK2, MRE11A, MUTYH, NBN, NF1, PALB2, PTEN, RAD50, RAD51C, RAD51D, and TP53.  (14) tamoxifen started 02/27/2014  PLAN: Jaidan's  mood has improved somewhat on the lower dose of venlafaxine. She was agreeable to increasing the dose to 126m daily for a better therapeutic benefit. This was sent to her pharmacy during the visit. She will double her current prescription for [id number] she runs out.   We discussed her weight gain, and I encouraged her to increase her intensity. She is post-menopausal at this point, so she  is in an "uphill battle' as far a fat loss is concerned. She may not see results with 3-4 days of cardio. She may need to increase this to 5-6 days, with more weight training incorporated. We discussed that muscle burns more calories at rest than fat. In addition, she will have to maintain a very strict diet, with few cheat meals.   Dollie has not yet had the nodule from her midsternum biopsied. I will contact the [facility] regarding an appointment. She will follow up with Dr. Jana Hakim at the end of September. She understands and agrees with this plan. She knows the goal of treatment in her case is cure. She has been encouraged to call with any issues that might arise before her next visit here.  Laurie Panda, NP   11/01/2014 10:54 AM

## 2014-11-10 ENCOUNTER — Other Ambulatory Visit: Payer: Self-pay | Admitting: Nurse Practitioner

## 2014-11-10 DIAGNOSIS — C773 Secondary and unspecified malignant neoplasm of axilla and upper limb lymph nodes: Principal | ICD-10-CM

## 2014-11-10 DIAGNOSIS — C50912 Malignant neoplasm of unspecified site of left female breast: Secondary | ICD-10-CM

## 2014-11-13 ENCOUNTER — Telehealth: Payer: Self-pay | Admitting: Oncology

## 2014-11-13 NOTE — Telephone Encounter (Signed)
Spoke with patient and gave d/t for Dr. Ronnette Hila 7/22 @ 1.  [phone](CVELFY) [phone])

## 2014-11-16 ENCOUNTER — Emergency Department (HOSPITAL_COMMUNITY)
Admission: EM | Admit: 2014-11-16 | Discharge: 2014-11-16 | Disposition: A | Discharge status: Home / Self Care | Payer: 59 | Attending: Emergency Medicine | Admitting: Emergency Medicine

## 2014-11-16 ENCOUNTER — Encounter (HOSPITAL_COMMUNITY): Payer: Self-pay | Admitting: Emergency Medicine

## 2014-11-16 DIAGNOSIS — K219 Gastro-esophageal reflux disease without esophagitis: Secondary | ICD-10-CM | POA: Diagnosis not present

## 2014-11-16 DIAGNOSIS — Z7982 Long term (current) use of aspirin: Secondary | ICD-10-CM | POA: Diagnosis not present

## 2014-11-16 DIAGNOSIS — Z923 Personal history of irradiation: Secondary | ICD-10-CM | POA: Diagnosis not present

## 2014-11-16 DIAGNOSIS — R110 Nausea: Secondary | ICD-10-CM | POA: Insufficient documentation

## 2014-11-16 DIAGNOSIS — R51 Headache: Secondary | ICD-10-CM | POA: Diagnosis not present

## 2014-11-16 DIAGNOSIS — R531 Weakness: Secondary | ICD-10-CM | POA: Diagnosis present

## 2014-11-16 DIAGNOSIS — Z3202 Encounter for pregnancy test, result negative: Secondary | ICD-10-CM | POA: Diagnosis not present

## 2014-11-16 DIAGNOSIS — R5383 Other fatigue: Secondary | ICD-10-CM | POA: Diagnosis not present

## 2014-11-16 DIAGNOSIS — M791 Myalgia: Secondary | ICD-10-CM | POA: Diagnosis not present

## 2014-11-16 DIAGNOSIS — R519 Headache, unspecified: Secondary | ICD-10-CM

## 2014-11-16 DIAGNOSIS — Z79899 Other long term (current) drug therapy: Secondary | ICD-10-CM | POA: Diagnosis not present

## 2014-11-16 DIAGNOSIS — Z853 Personal history of malignant neoplasm of breast: Secondary | ICD-10-CM | POA: Insufficient documentation

## 2014-11-16 LAB — URINALYSIS, ROUTINE W REFLEX MICROSCOPIC
Bilirubin Urine: NEGATIVE
Glucose, UA: NEGATIVE mg/dL
Hgb urine dipstick: NEGATIVE
KETONES UR: NEGATIVE mg/dL
Nitrite: NEGATIVE
Protein, ur: NEGATIVE mg/dL
SPECIFIC GRAVITY, URINE: 1.008 (ref 1.005–1.030)
Urobilinogen, UA: 0.2 mg/dL (ref 0.0–1.0)
pH: 5.5 (ref 5.0–8.0)

## 2014-11-16 LAB — URINE MICROSCOPIC-ADD ON

## 2014-11-16 LAB — CBC
HCT: 38.5 % (ref 36.0–46.0)
HEMOGLOBIN: 12.4 g/dL (ref 12.0–15.0)
MCH: 27.9 pg (ref 26.0–34.0)
MCHC: 32.2 g/dL (ref 30.0–36.0)
MCV: 86.7 fL (ref 78.0–100.0)
Platelets: 226 10*3/uL (ref 150–400)
RBC: 4.44 MIL/uL (ref 3.87–5.11)
RDW: 13.3 % (ref 11.5–15.5)
WBC: 4.3 10*3/uL (ref 4.0–10.5)

## 2014-11-16 LAB — COMPREHENSIVE METABOLIC PANEL
ALT: 15 U/L (ref 14–54)
AST: 18 U/L (ref 15–41)
Albumin: 4 g/dL (ref 3.5–5.0)
Alkaline Phosphatase: 63 U/L (ref 38–126)
Anion gap: 8 (ref 5–15)
BILIRUBIN TOTAL: 0.4 mg/dL (ref 0.3–1.2)
BUN: 11 mg/dL (ref 6–20)
CO2: 27 mmol/L (ref 22–32)
Calcium: 9.7 mg/dL (ref 8.9–10.3)
Chloride: 103 mmol/L (ref 101–111)
Creatinine, Ser: 0.85 mg/dL (ref 0.44–1.00)
GFR calc Af Amer: >60 mL/min (ref >60)
GFR calc non Af Amer: >60 mL/min (ref >60)
Glucose, Bld: 125 mg/dL — ABNORMAL HIGH (ref 65–99)
Potassium: 3.9 mmol/L (ref 3.5–5.1)
Sodium: 138 mmol/L (ref 135–145)
TOTAL PROTEIN: 7.8 g/dL (ref 6.5–8.1)

## 2014-11-16 LAB — LIPASE, BLOOD: Lipase: 18 U/L — ABNORMAL LOW (ref 22–51)

## 2014-11-16 MED ORDER — ONDANSETRON 4 MG PO TBDP
4.0000 mg | ORAL_TABLET | Freq: Once | ORAL | Status: AC | PRN
  Administered 2014-11-16: 4 mg via ORAL
  Filled 2014-11-16: qty 1

## 2014-11-16 NOTE — ED Provider Notes (Signed)
CSN: [id number]     Arrival date & time 11/16/14  0106 History   First MD Initiated Contact with Patient 11/16/14 [phone]     Chief Complaint  Patient presents with  . Generalized Body Aches  . Weakness     (Consider location/radiation/quality/duration/timing/severity/associated sxs/prior Treatment) HPI Comments: 39 y/o female with a history of breast cancer (2011 and 2015, currently in remission; not on any chemo agents), esophageal reflux, and back spasms presents to the emergency department for further evaluation of generalized body aches. Patient states that symptoms have been present since yesterday morning. She states that she woke feeling nauseous with a mild headache. She has had some mild, intermittent dizziness as well as nausea. Patient is also complaining of some generalized fatigue and sporadic chills. She reports that her headache was present in her bilateral temples. The pain was throbbing and improved after taking Excedrin Migraine. She last took this medication at 2000 yesterday. Patient denies any sick contacts. She further denies fever, vomiting, nasal congestion, rhinorrhea, hearing changes, vision changes, abdominal pain, dysuria or hematuria. No recent travel. Patient states, "I just don't feel like myself". She reports having her Effexor dose increased 2 weeks ago.  PCP - Maurice Small  The history is provided by the patient. No language interpreter was used.    Past Medical History  Diagnosis Date  . Cancer     breast ca  . Malignant neoplasm of breast (female), unspecified site   . Radiation 10/24/13-12/09/13    Left chestwall/supraclav./scar  . GERD (gastroesophageal reflux disease)   . Back muscle spasm     occasional tx with skelaxin   Past Surgical History  Procedure Laterality Date  . Bilateral total mastectomy with axillary lymph node dissection    . Tonsillectomy    . Leep    . Portacath placement N/A 06/16/2013    Procedure: POWER PORT PLACEMENT;  Surgeon:  Shann Medal, MD;  Location: [facility];  Service: General;  Laterality: N/A;  . Axillary lymph node dissection Left 06/16/2013    Procedure: LEFT AXILLARY NODE DISSECTION;  Surgeon: Shann Medal, MD;  Location: [facility];  Service: General;  Laterality: Left;  . Breast surgery    . Wisdom tooth extraction    . Right leg surgery      broken femur - has rod in leg  . Laparoscopic bilateral salpingo oopherectomy Bilateral 01/25/2014    Procedure: LAPAROSCOPIC BILATERAL SALPINGO OOPHORECTOMY;  Surgeon: Eldred Manges, MD;  Location: [facility];  Service: Gynecology;  Laterality: Bilateral;  . Port-a-cath removal N/A 01/25/2014    Procedure: REMOVAL PORT-A-CATH;  Surgeon: Alphonsa Overall, MD;  Location: [facility];  Service: General;  Laterality: N/A;   Family History  Problem Relation Age of Onset  . Other Mother 66    glioblastoma; deceased 33  . Cancer Mother   . Other Father 44    glioblastoma; deceased 26  . Cancer Father   . Hypertension Other    History  Substance Use Topics  . Smoking status: Never Smoker   . Smokeless tobacco: Never Used  . Alcohol Use: Yes     Comment: socially wine   OB History    Obstetric Comments   Menarche 10,No full-term pregnancies.birth control pills x 18 years. LMP  07/18/2013      Review of Systems  Constitutional: Positive for chills (sporadic) and fatigue. Negative for fever.  Gastrointestinal: Positive for nausea. Negative for vomiting.  Musculoskeletal: Positive for myalgias.  Neurological: Positive for headaches.  All other systems reviewed and are negative.   Allergies  Sulfa antibiotics; Gadolinium derivatives; and Petrolatum  Home Medications   Prior to Admission medications   Medication Sig Start Date End Date Taking? Authorizing Provider  aspirin-acetaminophen-caffeine (EXCEDRIN MIGRAINE) [phone] MG per tablet Take by mouth every 6 (six) hours as needed for headache.   Yes Historical Provider, MD  omega-3 acid ethyl esters (LOVAZA) 1  G capsule Take 1 g by mouth daily.   Yes Historical Provider, MD  omeprazole (PRILOSEC) 40 MG capsule Take 1 capsule (40 mg total) by mouth daily. Patient taking differently: Take 40 mg by mouth daily as needed (acid reflux).  09/20/14  Yes Laurie Panda, NP  tamoxifen (NOLVADEX) 20 MG tablet Take 1 tablet (20 mg total) by mouth daily. 08/01/14  Yes Chauncey Cruel, MD  Venlafaxine HCl 150 MG TB24 Take 1 tablet (150 mg total) by mouth daily. 11/01/14  Yes Laurie Panda, NP  metaxalone (SKELAXIN) 800 MG tablet Take 1 tablet (800 mg total) by mouth 3 (three) times daily as needed for muscle spasms. Patient not taking: Reported on 11/16/2014 01/03/14   Chauncey Cruel, MD   BP 140/89 mmHg  Pulse 107  Temp(Src) 99 F (37.2 C) (Oral)  Resp 22  SpO2 99%   Physical Exam  Constitutional: She is oriented to person, place, and time. She appears well-developed and well-nourished. No distress.  Nontoxic/nonseptic appearing  HENT:  Head: Normocephalic and atraumatic.  Mouth/Throat: Oropharynx is clear and moist. No oropharyngeal exudate.  Symmetric rise of the uvula with phonation  Eyes: Conjunctivae and EOM are normal. Pupils are equal, round, and reactive to light. No scleral icterus.  Neck: Normal range of motion.  No nuchal rigidity or meningismus  Cardiovascular: Normal rate, regular rhythm and intact distal pulses.   Patient not tachycardic as noted in triage  Pulmonary/Chest: Effort normal and breath sounds normal. No respiratory distress. She has no wheezes. She has no rales.  Respirations even and unlabored  Abdominal: Soft. She exhibits no distension. There is no tenderness. There is no rebound and no guarding.  Soft, nontender  Musculoskeletal: Normal range of motion.  Neurological: She is alert and oriented to person, place, and time. No cranial nerve deficit. She exhibits normal muscle tone. Coordination normal.  GCS 15. Speech is goal oriented. No focal neurologic deficits  appreciated. Patient ambulatory with steady gait.  Skin: Skin is warm and dry. No rash noted. She is not diaphoretic. No erythema. No pallor.  Psychiatric: She has a normal mood and affect. Her behavior is normal.  Nursing note and vitals reviewed.   ED Course  Procedures (including critical care time) Labs Review Labs Reviewed  LIPASE, BLOOD - Abnormal; Notable for the following:    Lipase 18 (*)    All other components within normal limits  COMPREHENSIVE METABOLIC PANEL - Abnormal; Notable for the following:    Glucose, Bld 125 (*)    All other components within normal limits  URINALYSIS, ROUTINE W REFLEX MICROSCOPIC (NOT AT [facility]) - Abnormal; Notable for the following:    APPearance CLOUDY (*)    Leukocytes, UA TRACE (*)    All other components within normal limits  URINE MICROSCOPIC-ADD ON - Abnormal; Notable for the following:    Squamous Epithelial / LPF MANY (*)    Bacteria, UA FEW (*)    All other components within normal limits  CBC    Imaging Review No results found.   EKG Interpretation None  MDM   Final diagnoses:  Other fatigue  Acute nonintractable headache, unspecified headache type    Patient presents with c/o fatigue and "not feeling like myself". She reports nausea and a headache which began in the AM and resolved significantly with Excedrin. Patient is afebrile, nontoxic/nonseptic appearing. She is hemodynamically stable. Patient's physical exam is reassuring; neurologic exam is nonfocal. Laboratory work up today is noncontributory.  Patient's symptoms are very vague; more c/w a generalized feeling of being unwell. It is possible that symptoms may be simply viral in etiology. Symptoms also c/w stress reaction vs side effects from recent change in Effexor dosing. Doubt emergent process as cause of symptoms today.  Have had a lengthy conversation with the patient regarding f/u with her PCP for further evaluation of her symptoms today. I do not believe  further emergent work up of patient's symptoms is indicated at this time. I have discussed return if symptoms worsen or persist as more emergent work up may be indicated at that time. Patient agreeable to plan with no unaddressed concerns. Patient discharged from the ED in good condition; she left prior to receiving her d/c instructions.   Filed Vitals:   11/16/14 0110  BP: 140/89  Pulse: 107  Temp: 99 F (37.2 C)  TempSrc: Oral  Resp: 22  SpO2: 99%     Antonietta Breach, PA-C 11/23/14 1059  Debby Freiberg, MD 11/25/14 2255

## 2014-11-16 NOTE — Discharge Instructions (Signed)
Your blood work today is consistent with your prior workups. You do not have a white count to suggest infection. Your urine also does not suggest infection. It is possible that your symptoms may be from the increase in your Effexor dose. I would recommend a follow-up with your primary care doctor regarding your symptoms today. Return to the Emergency Department if you develop worsening pain, fever, numbness or weakness on one side of your body, uncontrollable nausea with vomiting, or any of the symptoms listed below.  Fatigue Fatigue is a feeling of tiredness, lack of energy, lack of motivation, or feeling tired all the time. Having enough rest, good nutrition, and reducing stress will normally reduce fatigue. Consult your caregiver if it persists. The nature of your fatigue will help your caregiver to find out its cause. The treatment is based on the cause.  CAUSES  There are many causes for fatigue. Most of the time, fatigue can be traced to one or more of your habits or routines. Most causes fit into one or more of three general areas. They are: Lifestyle problems  Sleep disturbances.  Overwork.  Physical exertion.  Unhealthy habits.  Poor eating habits or eating disorders.  Alcohol and/or drug use .  Lack of proper nutrition (malnutrition). Psychological problems  Stress and/or anxiety problems.  Depression.  Grief.  Boredom. Medical Problems or Conditions  Anemia.  Pregnancy.  Thyroid gland problems.  Recovery from major surgery.  Continuous pain.  Emphysema or asthma that is not well controlled  Allergic conditions.  Diabetes.  Infections (such as mononucleosis).  Obesity.  Sleep disorders, such as sleep apnea.  Heart failure or other heart-related problems.  Cancer.  Kidney disease.  Liver disease.  Effects of certain medicines such as antihistamines, cough and cold remedies, prescription pain medicines, heart and blood pressure medicines, drugs  used for treatment of cancer, and some antidepressants. SYMPTOMS  The symptoms of fatigue include:   Lack of energy.  Lack of drive (motivation).  Drowsiness.  Feeling of indifference to the surroundings. DIAGNOSIS  The details of how you feel help guide your caregiver in finding out what is causing the fatigue. You will be asked about your present and past health condition. It is important to review all medicines that you take, including prescription and non-prescription items. A thorough exam will be done. You will be questioned about your feelings, habits, and normal lifestyle. Your caregiver may suggest blood tests, urine tests, or other tests to look for common medical causes of fatigue.  TREATMENT  Fatigue is treated by correcting the underlying cause. For example, if you have continuous pain or depression, treating these causes will improve how you feel. Similarly, adjusting the dose of certain medicines will help in reducing fatigue.  HOME CARE INSTRUCTIONS   Try to get the required amount of good sleep every night.  Eat a healthy and nutritious diet, and drink enough water throughout the day.  Practice ways of relaxing (including yoga or meditation).  Exercise regularly.  Make plans to change situations that cause stress. Act on those plans so that stresses decrease over time. Keep your work and personal routine reasonable.  Avoid street drugs and minimize use of alcohol.  Start taking a daily multivitamin after consulting your caregiver. SEEK MEDICAL CARE IF:   You have persistent tiredness, which cannot be accounted for.  You have fever.  You have unintentional weight loss.  You have headaches.  You have disturbed sleep throughout the night.  You are feeling  sad.  You have constipation.  You have dry skin.  You have gained weight.  You are taking any new or different medicines that you suspect are causing fatigue.  You are unable to sleep at  night.  You develop any unusual swelling of your legs or other parts of your body. SEEK IMMEDIATE MEDICAL CARE IF:   You are feeling confused.  Your vision is blurred.  You feel faint or pass out.  You develop severe headache.  You develop severe abdominal, pelvic, or back pain.  You develop chest pain, shortness of breath, or an irregular or fast heartbeat.  You are unable to pass a normal amount of urine.  You develop abnormal bleeding such as bleeding from the rectum or you vomit blood.  You have thoughts about harming yourself or committing suicide.  You are worried that you might harm someone else. MAKE SURE YOU:   Understand these instructions.  Will watch your condition.  Will get help right away if you are not doing well or get worse. Document Released: 02/09/2007 Document Revised: 07/07/2011 Document Reviewed: 08/16/2013 [facility] Patient Information 2015 [place], [place]. This information is not intended to replace advice given to you by your health care provider. Make sure you discuss any questions you have with your health care provider.

## 2014-11-16 NOTE — ED Notes (Signed)
Patient left prior to receiving discharge papers.  Patient ambulatory with steady gait.  No deficits noted.

## 2014-11-16 NOTE — ED Notes (Signed)
Pt from home c/o weakness, malaise, and body aches since yesterday at 0700 when she woke up. She reports one episode of vomited. Hx of breast CA and is remission.

## 2014-11-27 ENCOUNTER — Other Ambulatory Visit: Payer: Self-pay | Admitting: Nurse Practitioner

## 2015-01-02 ENCOUNTER — Other Ambulatory Visit (HOSPITAL_BASED_OUTPATIENT_CLINIC_OR_DEPARTMENT_OTHER): Payer: 59

## 2015-01-02 ENCOUNTER — Telehealth: Payer: Self-pay | Admitting: *Deleted

## 2015-01-02 DIAGNOSIS — C773 Secondary and unspecified malignant neoplasm of axilla and upper limb lymph nodes: Principal | ICD-10-CM

## 2015-01-02 DIAGNOSIS — C50912 Malignant neoplasm of unspecified site of left female breast: Secondary | ICD-10-CM

## 2015-01-02 LAB — CBC WITH DIFFERENTIAL/PLATELET
BASO%: 1.2 % (ref 0.0–2.0)
Basophils Absolute: 0.1 10*3/uL (ref 0.0–0.1)
EOS%: 5.6 % (ref 0.0–7.0)
Eosinophils Absolute: 0.3 10*3/uL (ref 0.0–0.5)
HEMATOCRIT: 37.3 % (ref 34.8–46.6)
HGB: 12.1 g/dL (ref 11.6–15.9)
LYMPH%: 35.8 % (ref 14.0–49.7)
MCH: 27.9 pg (ref 25.1–34.0)
MCHC: 32.4 g/dL (ref 31.5–36.0)
MCV: 86.1 fL (ref 79.5–101.0)
MONO#: 0.4 10*3/uL (ref 0.1–0.9)
MONO%: 7.3 % (ref 0.0–14.0)
NEUT%: 50.1 % (ref 38.4–76.8)
NEUTROS ABS: 2.4 10*3/uL (ref 1.5–6.5)
PLATELETS: 255 10*3/uL (ref 145–400)
RBC: 4.34 10*6/uL (ref 3.70–5.45)
RDW: 14 % (ref 11.2–14.5)
WBC: 4.8 10*3/uL (ref 3.9–10.3)
lymph#: 1.7 10*3/uL (ref 0.9–3.3)

## 2015-01-02 LAB — COMPREHENSIVE METABOLIC PANEL (CC13)
ALK PHOS: 64 U/L (ref 40–150)
ALT: 14 U/L (ref 0–55)
ANION GAP: 6 meq/L (ref 3–11)
AST: 16 U/L (ref 5–34)
Albumin: 3.7 g/dL (ref 3.5–5.0)
BILIRUBIN TOTAL: 0.22 mg/dL (ref 0.20–1.20)
BUN: 11.4 mg/dL (ref 7.0–26.0)
CALCIUM: 10.4 mg/dL (ref 8.4–10.4)
CO2: 28 meq/L (ref 22–29)
CREATININE: 0.8 mg/dL (ref 0.6–1.1)
Chloride: 107 mEq/L (ref 98–109)
EGFR: >90 mL/min/{1.73_m2} (ref >90)
Glucose: 104 mg/dl (ref 70–140)
Potassium: 4.5 mEq/L (ref 3.5–5.1)
Sodium: 141 mEq/L (ref 136–145)
TOTAL PROTEIN: 7.3 g/dL (ref 6.4–8.3)

## 2015-01-02 NOTE — Telephone Encounter (Signed)
Pt came in to the office for RN to assess Bx site " that bleed ".  Per assessment noted cm  incision on left upper mid chest with pin head size dehiscence- noted stitch coming thru this area. Area not red, swollen nor tender to touch. No drainage .  Per discussion- [name] states she noted stitch " coming thru skin so I put a little hydrogen peroxide on it - then the next morning when I got up the site started bleeding ".  This RN informed pt- use of peroxide may have eroded skin healing at site. Overall Bx site is without signs of infection.  Advised pt to not to use peroxide- but to keep clean, may use neosporin and cover with bandaid.  [name] stated understanding as well as " I feel better now after speaking with you".

## 2015-01-09 ENCOUNTER — Ambulatory Visit: Payer: 59 | Admitting: Oncology

## 2015-01-16 ENCOUNTER — Ambulatory Visit (HOSPITAL_BASED_OUTPATIENT_CLINIC_OR_DEPARTMENT_OTHER): Payer: 59 | Admitting: Oncology

## 2015-01-16 ENCOUNTER — Telehealth: Payer: Self-pay | Admitting: Oncology

## 2015-01-16 VITALS — BP 124/80 | HR 74 | Temp 98.7°F | Resp 18 | Ht 67.0 in | Wt 221.2 lb

## 2015-01-16 DIAGNOSIS — C50912 Malignant neoplasm of unspecified site of left female breast: Secondary | ICD-10-CM | POA: Diagnosis not present

## 2015-01-16 MED ORDER — GABAPENTIN 300 MG PO CAPS: 300.0000 mg | ORAL_CAPSULE | Freq: Every day | ORAL | Status: DC

## 2015-01-16 MED ORDER — TAMOXIFEN CITRATE 20 MG PO TABS: 20.0000 mg | ORAL_TABLET | Freq: Every day | ORAL | Status: DC

## 2015-01-16 NOTE — Telephone Encounter (Signed)
Appointments made and avs pritned for patient °

## 2015-01-16 NOTE — Progress Notes (Signed)
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 A biopsy of the mass in question [place] [facility] showed ([id number]) and invasive lobular carcinoma, which was estrogen and progesterone receptor both 100% positive, both with strong staining intensity, but HER-2 negative at 1+. Bilateral breast MRI 12/13/2009 in [place] showed in the left breast an irregularly marginated mass measuring 3.2 cm. There were 4 or 5 separate satellite lesions laterally and superior to the primary.  Accordingly, after appropriate discussion, the patient underwent bilateral mastectomies with left sentinel lymph node sampling 01/02/2010. The right breast was benign. The left breast showed a 2.3 cm invasive lobular carcinoma, grade 3, with a total of 6 sentinel lymph nodes removed, all negative, although 2 showed isolated tumor cells by immunostaining. Margins were negative.  An Oncotype BX was sent, with a recurrence score of 22, predicting a risk of distant recurrence within 10 years with 14% of the patients only systemic treatment was tamoxifen. The patient was treated with CMF chemotherapy between 02/22/2010 and 07/05/2010, receiving 7 cycles at which point the patient refused further chemotherapy though there have been no major toxicities at according to the oncology note from [name]. The patient was then started on tamoxifen 08/09/2010. By her  cancer took approximately 12-15 months then stopped because of hot flashes and insomnia problems.  More recently, the patient noted a change in her left axilla andunderwent bilateral breast MRI January to 2015 at [name]. The patient has bilateral submuscular silicone implants in place. The breast were unremarkable, but there were several lymph nodes with cortical thickening in the left axilla including a dominant 1 measuring 1.2 cm in the short axis. Ultrasonography of this area identified the lymph node in question and the patient underwent biopsy of the left axillary lymph node This showed ([id number]) near-total replacement of the lymph node by metastatic carcinoma. This was 96% estrogen receptor positive, and 84% progesterone receptor positive, both with strong staining intensity. The MIB-1 was 20%. HER-2 determination is pending.  The patient's subsequent history is as detailed below  INTERVAL HISTORY: [name] returns today for follow up of her breast cancer. She continues on tamoxifen. She is having hot flashes which wake her up at night. She is not in gabapentin. Going up on the venlafaxine 250 mg daily has helped. Since her last visit here she also had the suprasternal nodule removed at [name]. This is documented below  "The patient underwent a wide excision of the suspicious skin lesion. The area was clipped, prepped, draped and anesthetized with local anesthesia with lidocaine. It was excised with a 52m margin in a transverseorientation over the manubrium. Sebaceous material was seen during excision. This defect was then closed with a subcuticular absorbable suture and dressed with steri-strips gauze and an op site. The patient was given instructions on wound care. The Specimen was sent to pathology. The patient was given instructions on wound care." (11/17/2014   ACCESSION NUMBER:[id number]  RECEIVED: 11/20/2014  ORDERING PHYSICIAN:Stacy ALLEN  Bell , MD  PATIENT  NAME:[name], [name]  SURGICAL PATHOLOGY REPORT    FINAL PATHOLOGIC DIAGNOSIS  MICROSCOPIC EXAMINATION AND DIAGNOSIS    SKIN, LEFT STERNUM, EXCISION:  Ruptured follicular cyst, infundibular type (epidermoid  cyst.).    Report Prepared [name] , MD   REVIEW OF SYSTEMS: [name] has developed new numbness and tingling in her arm and hand on the left. Of course she has lymphedema on that side from the prior surgery. The numbness and tingling occurs several times a day and always when she wakes up in the morning. It tends to get better but then recurs. The lymphedema is no worse. The problem occurs whether or not she wears her sleeve. A second issue is her weight gain. She is exercising about 5 times a week, 30-45 minutes of Cardiolite a time. She has eliminated bread past of potatoes and rice pretty much from her diet. Nevertheless she has not been able to lose any weight. Aside from these issues a detailed review of systems today was stable  PAST MEDICAL HISTORY: Past Medical History  Diagnosis Date  . Cancer     breast ca  . Malignant neoplasm of breast (female), unspecified site   . Radiation 10/24/13-12/09/13    Left chestwall/supraclav./scar  . GERD (gastroesophageal reflux disease)   . Back muscle spasm     occasional tx with skelaxin    PAST SURGICAL HISTORY: Past Surgical History  Procedure Laterality Date  . Bilateral total mastectomy with axillary lymph node dissection    . Tonsillectomy    . Leep    . Portacath placement N/A 06/16/2013    Procedure: POWER PORT PLACEMENT;  Surgeon: Stacy Medal, MD;  Location: [facility];  Service: Bell;  Laterality: N/A;  . Axillary lymph node dissection Left 06/16/2013    Procedure: LEFT AXILLARY NODE DISSECTION;  Surgeon: Stacy Medal, MD;  Location: [facility];  Service: Bell;  Laterality: Left;  . Breast surgery    . Wisdom tooth extraction    . Right leg surgery      broken femur - has rod in leg  .  Laparoscopic bilateral salpingo oopherectomy Bilateral 01/25/2014    Procedure: LAPAROSCOPIC BILATERAL SALPINGO OOPHORECTOMY;  Surgeon: Stacy Manges, MD;  Location: [facility];  Service: Gynecology;  Laterality: Bilateral;  . Port-a-cath removal N/A 01/25/2014    Procedure: REMOVAL PORT-A-CATH;  Surgeon: Stacy Overall, MD;  Location: [facility];  Service: Bell;  Laterality: N/A;    FAMILY HISTORY Family History  Problem Relation Age of Onset  . Other Mother 37    glioblastoma; deceased 25  . Cancer Mother   . Other Father 25    glioblastoma; deceased 14  . Cancer Father   . Hypertension Other    according to the patient both her parents died from a primary brain cancers, namely we have the stomas, her father at 3, her mother at 2. The patient had one brother and one sister. There is no history of breast or ovarian cancer in the family  GYNECOLOGIC HISTORY:   (Reviewed 10/10/2013) Menarche age 16. The patient has never carried a child to term. She was still having regular periods at the time of her recent diagnosis. LMP 07/18/2013 as of 09/08/2013.  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  She shares a home with her significant other, Burnice Logan, who works at installing car radios and also in [place]: Not in [place]:  (Reviewed 10/10/2013) Social History  Substance Use Topics  . Smoking status: Never Smoker   . Smokeless tobacco: Never Used  . Alcohol Use: Yes     Comment: socially wine     Colonoscopy: Never  PAP: December 2014  Bone density: Never  Lipid panel: Not on file   Allergies  Allergen Reactions  . Sulfa Antibiotics Hives and Itching  . Gadolinium Derivatives Hives    After gado injection, pt had a few  small hives on left breast area. Treated with benadryl by dr Janeece Fitting   . Petrolatum Hives and Rash    Current Outpatient Prescriptions  Medication Sig Dispense Refill  . aspirin-acetaminophen-caffeine (EXCEDRIN MIGRAINE) 250-250-65 MG per tablet Take by mouth every 6 (six) hours as needed for headache.    . metaxalone (SKELAXIN) 800 MG tablet Take 1 tablet (800 mg total) by mouth 3 (three) times daily as needed for muscle spasms. (Patient not taking: Reported on 11/16/2014) 60 tablet 0  . omega-3 acid ethyl esters (LOVAZA) 1 G capsule Take 1 g by mouth daily.    Marland Kitchen omeprazole (PRILOSEC) 40 MG capsule Take 1 capsule (40 mg total) by mouth daily. (Patient taking differently: Take 40 mg by mouth daily as needed (acid reflux). ) 90 capsule 3  . tamoxifen (NOLVADEX) 20 MG tablet Take 1 tablet (20 mg total) by mouth daily. 30 tablet 5  . Venlafaxine HCl 150 MG TB24 Take 1 tablet (150 mg total) by mouth daily. 30 each 11   No current facility-administered medications for this visit.    OBJECTIVE: Young [place] American woman who appears  tired but is in no acute distress Filed Vitals:   01/16/15 1013  BP: 124/80  Pulse: 74  Temp: 98.7 F (37.1 C)  Resp: 18     Body mass index is 34.64 kg/(m^2).    ECOG FS:1 - Symptomatic but completely ambulatory Filed Weights   01/16/15 1013  Weight: 221 lb 3.2 oz (100.336 kg)   Sclerae unicteric, pupils round and equal Oropharynx clear and moist-- no thrush or other lesions No cervical or supraclavicular adenopathy Lungs no rales or rhonchi Heart regular rate and rhythm Abd soft, nontender, positive bowel sounds MSK no focal spinal tenderness, left upper extremity lymphedema, grade 1-2 Neuro: nonfocal, well oriented, appropriate affect Breasts: Status post bilateral mastectomies with bilateral implant reconstruction. There is no evidence of local recurrence. Both axillae are benign Skin: Keloids in the upper anterior chest bilaterally, but no  suspicious skin lesions noted   LAB RESULTS:   Lab Results  Component Value Date   WBC 4.8 01/02/2015   NEUTROABS 2.4 01/02/2015   HGB 12.1 01/02/2015   HCT 37.3 01/02/2015   MCV 86.1 01/02/2015   PLT 255 01/02/2015      Chemistry      Component Value Date/Time   NA 141 01/02/2015 1032   NA 138 11/16/2014 0129   K 4.5 01/02/2015 1032   K 3.9 11/16/2014 0129   CL 103 11/16/2014 0129   CO2 28 01/02/2015 1032   CO2 27 11/16/2014 0129   BUN 11.4 01/02/2015 1032   BUN 11 11/16/2014 0129   CREATININE 0.8 01/02/2015 1032   CREATININE 0.85 11/16/2014 0129      Component Value Date/Time   CALCIUM 10.4 01/02/2015 1032   CALCIUM  9.7 11/16/2014 0129   ALKPHOS 64 01/02/2015 1032   ALKPHOS 63 11/16/2014 0129   AST 16 01/02/2015 1032   AST 18 11/16/2014 0129   ALT 14 01/02/2015 1032   ALT 15 11/16/2014 0129   BILITOT 0.22 01/02/2015 1032   BILITOT 0.4 11/16/2014 0129      STUDIES: No results found.   ASSESSMENT: 39 y.o. BRCA negative [place] woman  (1) status post bilateral mastectomies with left axillary lymph node sampling [6 nodes removed] 01/02/2010 for a pT2 pN0(i+), stage IIA invasive lobular breast cancer, grade 3, estrogen and progesterone receptor positive, HER-2 not amplified; right breast was benign  (2) Oncotype DX score of 22 predicted a 14% risk of distant recurrence within 10 years if the patient's only systemic treatment is tamoxifen for 5 years  (3) status post CMF(cyclophosphamide, fluorouracil, methotrexate) x7 given between October of 2011 and March of 2012 (7 of 8 planned treatments completed)  (4) tamoxifen started April 2012, discontinued approximately July of 2013 (about 15 months) because of problems with hot flashes and insomnia  (5) pathologically documented left axillary recurrence 05/05/2013, the tumor being again estrogen and progesterone receptor positive, with HER-2 not amplified, and MIB-1 of 15%. Staging CT/PET 05/27/2013 show left  axillary and supraclavicular recurrence, no distant disease  (6) status post completion left axillary lymph node dissection 06/16/2013 showing a total of 10/15 lymph nodes involved by tumor, with evidence of extracapsular extension (TX N3 = stage IIIC)  (7) treated with carboplatin/ docetaxel, first dose on 07/11/2013, repeated every 21 days x 4 with Neulasta support on day 2.  Final dose was given on 09/12/2013, with chemotherapy discontinued after 4 cycles due to continuing low counts.  (8) radiation completed 12/09/2013: Left chest wall / 50.4 Gray @ 1.8 Gray per fraction x 28 fractions Left Supraclavicular fossa and PAB/ 45 Gray @1 .8 Gray per fraction x 25 fractions Left scar / 10 Gray at [employer] per fraction x 5 fractions   (9) bilateral salpingo-oophorectomy on 01/25/14  (10)   lymphedema and limited range of motion in the left upper extremity secondary to left axillary lymph node dissection, being treated through the lymphedema clinic  - improved   (13) sequencing and deletion/duplication analysis of 17 genes performed September 2015 at [facility] did not reveal any deleterious mutation in  ATM, BARD1, BRCA1, BRCA2, BRIP1, CDH1, CHEK2, MRE11A, MUTYH, NBN, NF1, PALB2, PTEN, RAD50, RAD51C, RAD51D, and TP53.  (14) tamoxifen started 02/27/2014  PLAN: Nicosha is doing better on venlafaxine and 150 mg daily and we will continue that dose. I think we can improve on her sleep problems by adding gabapentin 300 mg at bedtime and I have put in that prescription for her area she has a good understanding of the possible toxicities, side effects and complications of that agent, which, however, at this dose, is generally very well tolerated.  Her left upper extremity lymphedema is well managed and she uses the compression sleeve correctly. I don't know why only now for the last 3 weeks she has begun to develop paresthesias. This could be simple progression of scar tissue but it also could be tumor  recurrence. I'm going to set her up for an MRI to evaluate the left axillary neurovascular bundle. What we will call her with those results.  Otherwise she will see [place] again in 3 months. She knows to call for any problems that may develop before that visit. Chauncey Cruel, MD   01/16/2015 10:33 AM

## 2015-01-23 ENCOUNTER — Ambulatory Visit (HOSPITAL_COMMUNITY): Admission: RE | Admit: 2015-01-23 | Payer: 59 | Source: Ambulatory Visit | Admitting: Oncology

## 2015-02-27 ENCOUNTER — Other Ambulatory Visit: Payer: Self-pay | Admitting: Oncology

## 2015-02-27 ENCOUNTER — Ambulatory Visit (HOSPITAL_COMMUNITY)
Admission: RE | Admit: 2015-02-27 | Discharge: 2015-02-27 | Disposition: A | Discharge status: Home / Self Care | Payer: 59 | Source: Ambulatory Visit | Attending: Oncology | Admitting: Oncology

## 2015-02-27 DIAGNOSIS — M50222 Other cervical disc displacement at C5-C6 level: Secondary | ICD-10-CM | POA: Insufficient documentation

## 2015-02-27 DIAGNOSIS — C50912 Malignant neoplasm of unspecified site of left female breast: Secondary | ICD-10-CM

## 2015-02-27 MED ORDER — GADOBENATE DIMEGLUMINE 529 MG/ML IV SOLN
20.0000 mL | Freq: Once | INTRAVENOUS | Status: AC | PRN
  Administered 2015-02-27: 20 mL via INTRAVENOUS

## 2015-02-27 MED ORDER — DIPHENHYDRAMINE HCL 50 MG PO CAPS
50.0000 mg | ORAL_CAPSULE | Freq: Four times a day (QID) | ORAL | Status: AC | PRN
  Administered 2015-02-27: 50 mg via ORAL
  Filled 2015-02-27: qty 1

## 2015-02-27 NOTE — Progress Notes (Unsigned)
I called [name] with the results of her MRI, which are very favorable. There is no abnormality in the right brachial plexus or neurovascular bundle.  She tells me she has neuropathic feelings in both hands. I suppose she could have bilateral carpal tunnel. Possibly she could have a C6 lesion as suggested in the MRI.  I offered to send her to a neurosurgeon for further evaluation but she prefers to wait until she sees [place] again in December.

## 2015-03-09 ENCOUNTER — Telehealth: Payer: Self-pay | Admitting: *Deleted

## 2015-03-09 NOTE — Telephone Encounter (Signed)
TC from pt requesting refill on her Skelaxin 800 mg. Last filled on 01/03/14. Pt states she is having severe muscle spasms in her back.

## 2015-03-12 ENCOUNTER — Other Ambulatory Visit: Payer: Self-pay

## 2015-03-12 DIAGNOSIS — C50919 Malignant neoplasm of unspecified site of unspecified female breast: Secondary | ICD-10-CM

## 2015-03-12 DIAGNOSIS — C773 Secondary and unspecified malignant neoplasm of axilla and upper limb lymph nodes: Principal | ICD-10-CM

## 2015-03-12 DIAGNOSIS — R252 Cramp and spasm: Secondary | ICD-10-CM

## 2015-03-12 MED ORDER — METAXALONE 800 MG PO TABS: 800.0000 mg | ORAL_TABLET | Freq: Three times a day (TID) | ORAL | Status: DC | PRN

## 2015-03-12 NOTE — Telephone Encounter (Signed)
Please refill x 3

## 2015-03-15 ENCOUNTER — Other Ambulatory Visit: Payer: Self-pay | Admitting: *Deleted

## 2015-04-02 ENCOUNTER — Encounter: Payer: Self-pay | Admitting: Oncology

## 2015-04-02 NOTE — Progress Notes (Signed)
Faxed Attending Physician's Statement to [facility]. Forward a copy to medical record.

## 2015-04-09 ENCOUNTER — Other Ambulatory Visit: Payer: Self-pay

## 2015-04-09 DIAGNOSIS — C50912 Malignant neoplasm of unspecified site of left female breast: Secondary | ICD-10-CM

## 2015-04-10 ENCOUNTER — Telehealth: Payer: Self-pay | Admitting: *Deleted

## 2015-04-10 ENCOUNTER — Other Ambulatory Visit (HOSPITAL_BASED_OUTPATIENT_CLINIC_OR_DEPARTMENT_OTHER): Payer: 59

## 2015-04-10 DIAGNOSIS — C50912 Malignant neoplasm of unspecified site of left female breast: Secondary | ICD-10-CM

## 2015-04-10 LAB — CBC WITH DIFFERENTIAL/PLATELET
BASO%: 0.9 % (ref 0.0–2.0)
BASOS ABS: 0 10*3/uL (ref 0.0–0.1)
EOS%: 3.3 % (ref 0.0–7.0)
Eosinophils Absolute: 0.1 10*3/uL (ref 0.0–0.5)
HCT: 38.5 % (ref 34.8–46.6)
HGB: 12.2 g/dL (ref 11.6–15.9)
LYMPH%: 40.8 % (ref 14.0–49.7)
MCH: 27.3 pg (ref 25.1–34.0)
MCHC: 31.6 g/dL (ref 31.5–36.0)
MCV: 86.2 fL (ref 79.5–101.0)
MONO#: 0.3 10*3/uL (ref 0.1–0.9)
MONO%: 6.2 % (ref 0.0–14.0)
NEUT#: 2.2 10*3/uL (ref 1.5–6.5)
NEUT%: 48.8 % (ref 38.4–76.8)
Platelets: 241 10*3/uL (ref 145–400)
RBC: 4.46 10*6/uL (ref 3.70–5.45)
RDW: 13.5 % (ref 11.2–14.5)
WBC: 4.6 10*3/uL (ref 3.9–10.3)
lymph#: 1.9 10*3/uL (ref 0.9–3.3)

## 2015-04-10 LAB — COMPREHENSIVE METABOLIC PANEL
ALT: 11 U/L (ref 0–55)
AST: 15 U/L (ref 5–34)
Albumin: 3.7 g/dL (ref 3.5–5.0)
Alkaline Phosphatase: 64 U/L (ref 40–150)
Anion Gap: 8 mEq/L (ref 3–11)
BUN: 12.7 mg/dL (ref 7.0–26.0)
CALCIUM: 9.9 mg/dL (ref 8.4–10.4)
CHLORIDE: 105 meq/L (ref 98–109)
CO2: 26 mEq/L (ref 22–29)
Creatinine: 1 mg/dL (ref 0.6–1.1)
EGFR: 84 mL/min/{1.73_m2} — AB (ref >90)
GLUCOSE: 90 mg/dL (ref 70–140)
POTASSIUM: 4.2 meq/L (ref 3.5–5.1)
SODIUM: 140 meq/L (ref 136–145)
Total Bilirubin: 0.31 mg/dL (ref 0.20–1.20)
Total Protein: 7.5 g/dL (ref 6.4–8.3)

## 2015-04-10 NOTE — Telephone Encounter (Signed)
Returned call to pt to inform her that the Physician's Statement has been faxed to the [facility] on 04/02/15. Pt would like a copy for her records.

## 2015-04-17 ENCOUNTER — Encounter: Payer: Self-pay | Admitting: Nurse Practitioner

## 2015-04-17 ENCOUNTER — Telehealth: Payer: Self-pay | Admitting: Nurse Practitioner

## 2015-04-17 ENCOUNTER — Ambulatory Visit (HOSPITAL_BASED_OUTPATIENT_CLINIC_OR_DEPARTMENT_OTHER): Payer: 59 | Admitting: Nurse Practitioner

## 2015-04-17 VITALS — BP 114/79 | HR 88 | Temp 98.3°F | Resp 18 | Ht 67.0 in | Wt 223.6 lb

## 2015-04-17 DIAGNOSIS — C50912 Malignant neoplasm of unspecified site of left female breast: Secondary | ICD-10-CM | POA: Diagnosis not present

## 2015-04-17 DIAGNOSIS — M79602 Pain in left arm: Secondary | ICD-10-CM

## 2015-04-17 DIAGNOSIS — M5020 Other cervical disc displacement, unspecified cervical region: Secondary | ICD-10-CM | POA: Insufficient documentation

## 2015-04-17 DIAGNOSIS — R200 Anesthesia of skin: Secondary | ICD-10-CM

## 2015-04-17 DIAGNOSIS — C778 Secondary and unspecified malignant neoplasm of lymph nodes of multiple regions: Secondary | ICD-10-CM

## 2015-04-17 DIAGNOSIS — C50812 Malignant neoplasm of overlapping sites of left female breast: Secondary | ICD-10-CM

## 2015-04-17 DIAGNOSIS — M545 Low back pain: Secondary | ICD-10-CM

## 2015-04-17 DIAGNOSIS — M5030 Other cervical disc degeneration, unspecified cervical region: Secondary | ICD-10-CM | POA: Insufficient documentation

## 2015-04-17 DIAGNOSIS — I890 Lymphedema, not elsewhere classified: Secondary | ICD-10-CM

## 2015-04-17 DIAGNOSIS — M79601 Pain in right arm: Secondary | ICD-10-CM

## 2015-04-17 NOTE — Progress Notes (Signed)
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 A biopsy of the mass in question [facility] showed ([id number]) and invasive lobular carcinoma, which was estrogen and progesterone receptor both 100% positive, both with strong staining intensity, but HER-2 negative at 1+. Bilateral breast MRI 12/13/2009 in [place] showed in the left breast an irregularly marginated mass measuring 3.2 cm. There were 4 or 5 separate satellite lesions laterally and superior to the primary.  Accordingly, after appropriate discussion, the patient underwent bilateral mastectomies with left sentinel lymph node sampling 01/02/2010. The right breast was benign. The left breast showed a 2.3 cm invasive lobular carcinoma, grade 3, with a total of 6 sentinel lymph nodes removed, all negative, although 2 showed isolated tumor cells by immunostaining. Margins were negative.  An Oncotype BX was sent, with a recurrence score of 22, predicting a risk of distant recurrence within 10 years with 14% of the patients only systemic treatment was tamoxifen. The patient was treated with CMF chemotherapy between 02/22/2010 and 07/05/2010, receiving 7 cycles at which point the patient refused further chemotherapy though there have been no major toxicities at according to the oncology note from Dr. Brent General. The patient was then started on tamoxifen 08/09/2010. By her  cancer took approximately 12-15 months then stopped because of hot flashes and insomnia problems.  More recently, the patient noted a change in her left axilla andunderwent bilateral breast MRI January to 2015 at [place]. The patient has bilateral submuscular silicone implants in place. The breast were unremarkable, but there were several lymph nodes with cortical thickening in the left axilla including a dominant 1 measuring 1.2 cm in the short axis. Ultrasonography of this area identified the lymph node in question and the patient underwent biopsy of the left axillary lymph node This showed ([id number]) near-total replacement of the lymph node by metastatic carcinoma. This was 96% estrogen receptor positive, and 84% progesterone receptor positive, both with strong staining intensity. The MIB-1 was 20%. HER-2 determination is pending.  The patient's subsequent history is as detailed below  INTERVAL HISTORY: [name] returns today for follow up of her breast cancer. She continues on tamoxifen and tolerates this well for the most part. Her hot flashes are still very present despite venlafaxine 167m daily and gabapentin 3069mQHS. She denies vaginal changes. For the past 2 months she has had numbness and tingling to her left arm, which was affected by lymphedema. Lately, the right arm has been affected too. There is pain associated with both arms and back now as well. She was started on skelaxin 80057mID that brings her pain down to 4/10, but makes her drowsy. She had an MRI done of the upper body last month and is here to review the results.   REVIEW OF SYSTEMS: AngJosalynns been stalled from exercise due to this pain. She has gained more weight and of course this is disappointing to her. A detailed review of systems is otherwise entirely negative, except  where noted above.   PAST MEDICAL HISTORY: Past Medical History  Diagnosis Date  . Cancer     breast ca  . Malignant neoplasm of breast  (female), unspecified site   . Radiation 10/24/13-12/09/13    Left chestwall/supraclav./scar  . GERD (gastroesophageal reflux disease)   . Back muscle spasm     occasional tx with skelaxin    PAST SURGICAL HISTORY: Past Surgical History  Procedure Laterality Date  . Bilateral total mastectomy with axillary lymph node dissection    . Tonsillectomy    . Leep    . Portacath placement N/A 06/16/2013    Procedure: POWER PORT PLACEMENT;  Surgeon: Shann Medal, MD;  Location: [facility];  Service: General;  Laterality: N/A;  . Axillary lymph node dissection Left 06/16/2013    Procedure: LEFT AXILLARY NODE DISSECTION;  Surgeon: Shann Medal, MD;  Location: [facility];  Service: General;  Laterality: Left;  . Breast surgery    . Wisdom tooth extraction    . Right leg surgery      broken femur - has rod in leg  . Laparoscopic bilateral salpingo oopherectomy Bilateral 01/25/2014    Procedure: LAPAROSCOPIC BILATERAL SALPINGO OOPHORECTOMY;  Surgeon: Eldred Manges, MD;  Location: [facility];  Service: Gynecology;  Laterality: Bilateral;  . Port-a-cath removal N/A 01/25/2014    Procedure: REMOVAL PORT-A-CATH;  Surgeon: Alphonsa Overall, MD;  Location: [facility];  Service: General;  Laterality: N/A;    FAMILY HISTORY Family History  Problem Relation Age of Onset  . Other Mother 2    glioblastoma; deceased 70  . Cancer Mother   . Other Father 54    glioblastoma; deceased 49  . Cancer Father   . Hypertension Other    according to the patient both her parents died from a primary brain cancers, namely we have the stomas, her father at 21, her mother at 97. The patient had one brother and one sister. There is no history of breast or ovarian cancer in the family  GYNECOLOGIC HISTORY:   (Reviewed 10/10/2013) Menarche age 60. The patient has never carried a child to term. She was still having regular periods at the time of her recent diagnosis. LMP 07/18/2013 as of 09/08/2013.  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 She shares a home with her significant other, [name], who works at installing car radios and also in [place]: Not in [place]:  (Reviewed 10/10/2013) Social History  Substance Use Topics  . Smoking status: Never Smoker   . Smokeless tobacco: Never Used  . Alcohol Use: Yes     Comment: socially wine     Colonoscopy: Never  PAP: December 2014  Bone density: Never  Lipid panel: Not on file   Allergies  Allergen Reactions  . Sulfa Antibiotics Hives and Itching  . Gadolinium Derivatives Hives    After gado injection, pt had a few small hives on left breast area. Treated with benadryl by dr Janeece Fitting   . Petrolatum Hives and Rash    Current Outpatient Prescriptions  Medication Sig Dispense Refill  . aspirin-acetaminophen-caffeine (EXCEDRIN MIGRAINE) 250-250-65 MG per tablet Take by mouth every 6 (six) hours as needed for headache.    . gabapentin (NEURONTIN) 300 MG capsule  Take 1 capsule (300 mg total) by mouth at bedtime. 90 capsule 4  . metaxalone (SKELAXIN) 800 MG tablet Take 1 tablet (800 mg total) by mouth 3 (three) times daily as needed for muscle spasms. 60 tablet 3  . omeprazole (PRILOSEC) 40 MG capsule Take 1 capsule (40 mg total) by mouth daily. (Patient taking differently: Take 40 mg by mouth daily as needed (acid reflux). ) 90 capsule 3  . tamoxifen (NOLVADEX) 20 MG tablet Take 1 tablet (20 mg total) by mouth daily. 30 tablet 5  . Venlafaxine HCl 150 MG TB24 Take 1 tablet (150 mg total) by mouth daily. 30 each 11  . omega-3 acid ethyl esters (LOVAZA) 1 G capsule Take 1 g by mouth daily. Reported on 04/17/2015     No current facility-administered medications for this visit.    OBJECTIVE: Young [place]  American woman who appears  tired but is in no acute distress Filed Vitals:   04/17/15 1055  BP: 114/79  Pulse: 88  Temp: 98.3 F (36.8 C)  Resp: 18     Body mass index is 35.01 kg/(m^2).    ECOG FS:1 - Symptomatic but completely ambulatory Filed Weights   04/17/15 1055  Weight: 223 lb 9.6 oz (101.424 kg)   Skin: warm, dry, keloid to upper mid chest  HEENT: sclerae anicteric, conjunctivae pink, oropharynx clear. No thrush or mucositis.  Lymph Nodes: No cervical or supraclavicular lymphadenopathy  Lungs: clear to auscultation bilaterally, no rales, wheezes, or rhonci  Heart: regular rate and rhythm  Abdomen: round, soft, non tender, positive bowel sounds  Musculoskeletal: No focal spinal tenderness, left upper extremity lymphedema grad 1-2, no sleeve currently  Neuro: non focal, well oriented, positive affect  Breast: bilateral breasts status post mastectomies with implant reconstruction. No evidence of recurrent disease. Bilateral axillae benign.   LAB RESULTS:   Lab Results  Component Value Date   WBC 4.6 04/10/2015   NEUTROABS 2.2 04/10/2015   HGB 12.2 04/10/2015   HCT 38.5 04/10/2015   MCV 86.2 04/10/2015   PLT 241 04/10/2015      Chemistry      Component Value Date/Time   NA 140 04/10/2015 1015   NA 138 11/16/2014 0129   K 4.2 04/10/2015 1015   K 3.9 11/16/2014 0129   CL 103 11/16/2014 0129   CO2 26 04/10/2015 1015   CO2 27 11/16/2014 0129   BUN 12.7 04/10/2015 1015   BUN 11 11/16/2014 0129   CREATININE 1.0 04/10/2015 1015   CREATININE 0.85 11/16/2014 0129      Component Value Date/Time   CALCIUM 9.9 04/10/2015 1015   CALCIUM 9.7 11/16/2014 0129   ALKPHOS 64 04/10/2015 1015   ALKPHOS 63 11/16/2014 0129   AST 15 04/10/2015 1015   AST 18 11/16/2014 0129   ALT 11 04/10/2015 1015   ALT 15 11/16/2014 0129   BILITOT 0.31 04/10/2015 1015   BILITOT 0.4 11/16/2014 0129      STUDIES: No results found.  EXAM: MRI CHEST WITHOUT AND WITH  CONTRAST  TECHNIQUE: 20 cc MultiHance  CONTRAST: 58m MULTIHANCE GADOBENATE DIMEGLUMINE 529 MG/ML IV SOLN  COMPARISON: PET-CT dated 05/27/2013  FINDINGS: The left brachial plexus is well visualized and appears normal. The neurovascular bundle as well seen through the axilla and into the upper arm. There is no mass lesion or adenopathy. No pathologic enhancement after contrast administration. No supraclavicular mass or lymph node. There is a small bulge of the C5-6 disc to the left. Does the patient  have a C6 radiculopathy?  There is a small benign cyst in the posterior aspect of the humeral head, unchanged since the prior PET-CT.  Left lung apex is normal.  IMPRESSION: Small disc bulge at C5-6 to the left which could affect the left C6 nerve.  Otherwise, normal MRI of the left brachial plexus extending through the left axilla.   Electronically Signed  By: Lorriane Shire M.D.  On: 02/27/2015 08:57   ASSESSMENT: 39 y.o. BRCA negative [place] woman  (1) status post bilateral mastectomies with left axillary lymph node sampling [6 nodes removed] 01/02/2010 for a pT2 pN0(i+), stage IIA invasive lobular breast cancer, grade 3, estrogen and progesterone receptor positive, HER-2 not amplified; right breast was benign  (2) Oncotype DX score of 22 predicted a 14% risk of distant recurrence within 10 years if the patient's only systemic treatment is tamoxifen for 5 years  (3) status post CMF(cyclophosphamide, fluorouracil, methotrexate) x7 given between October of 2011 and March of 2012 (7 of 8 planned treatments completed)  (4) tamoxifen started April 2012, discontinued approximately July of 2013 (about 15 months) because of problems with hot flashes and insomnia  (5) pathologically documented left axillary recurrence 05/05/2013, the tumor being again estrogen and progesterone receptor positive, with HER-2 not amplified, and MIB-1 of 15%. Staging CT/PET 05/27/2013  show left axillary and supraclavicular recurrence, no distant disease  (6) status post completion left axillary lymph node dissection 06/16/2013 showing a total of 10/15 lymph nodes involved by tumor, with evidence of extracapsular extension (TX N3 = stage IIIC)  (7) treated with carboplatin/ docetaxel, first dose on 07/11/2013, repeated every 21 days x 4 with Neulasta support on day 2.  Final dose was given on 09/12/2013, with chemotherapy discontinued after 4 cycles due to continuing low counts.  (8) radiation completed 12/09/2013: Left chest wall / 50.4 Gray @ 1.8 Gray per fraction x 28 fractions Left Supraclavicular fossa and PAB/ 45 Gray @1 .8 Gray per fraction x 25 fractions Left scar / 10 Gray at [employer] per fraction x 5 fractions   (9) bilateral salpingo-oophorectomy on 01/25/14  (10)   lymphedema and limited range of motion in the left upper extremity secondary to left axillary lymph node dissection, being treated through the lymphedema clinic  - improved   (13) sequencing and deletion/duplication analysis of 17 genes performed September 2015 at [facility] did not reveal any deleterious mutation in  ATM, BARD1, BRCA1, BRCA2, BRIP1, CDH1, CHEK2, MRE11A, MUTYH, NBN, NF1, PALB2, PTEN, RAD50, RAD51C, RAD51D, and TP53.  (14) tamoxifen started 02/27/2014  PLAN: [name] and I reviewed the results of her chest MRI. The results were significant for a small disc bulge at C5-C6 which could affect the left C6 nerve. She is relieved to hear no suspicious lesions were found. I have placed a referral to Dr. Rolena Infante or the first available physician at [facility] for evaluation.   From a breast cancer point of view, she is doing well. The labs were reviewed in detail and were normal. She is tolerating the tamoxifen well and will continue this drug for at least 1 more year before giving consideration to an aromatase inhibitor.   [name] will return in 3 months for labs and a follow up visit.  She understands and agrees with this plan. She knows the goal of treatment in her case is cure. She has been encouraged to call with any issues that might arise before her next visit here.    Laurie Panda, NP  04/17/2015 12:24 PM

## 2015-04-17 NOTE — Telephone Encounter (Signed)
Appointments made and avs printed for patient,patient has information to call [facility]

## 2015-05-18 ENCOUNTER — Encounter: Payer: Self-pay | Admitting: Nurse Practitioner

## 2015-05-18 NOTE — Progress Notes (Signed)
I placed form for [name] to sign.

## 2015-05-21 ENCOUNTER — Telehealth: Payer: Self-pay | Admitting: Oncology

## 2015-05-21 ENCOUNTER — Encounter: Payer: Self-pay | Admitting: Nurse Practitioner

## 2015-05-21 ENCOUNTER — Other Ambulatory Visit: Payer: Self-pay | Admitting: *Deleted

## 2015-05-21 ENCOUNTER — Other Ambulatory Visit: Payer: Self-pay | Admitting: Nurse Practitioner

## 2015-05-21 DIAGNOSIS — M5030 Other cervical disc degeneration, unspecified cervical region: Secondary | ICD-10-CM

## 2015-05-21 DIAGNOSIS — M5020 Other cervical disc displacement, unspecified cervical region: Secondary | ICD-10-CM

## 2015-05-21 NOTE — Telephone Encounter (Signed)
Faxed medical records to [facility] [place] @ [phone]

## 2015-05-21 NOTE — Progress Notes (Unsigned)
Pt does not have insurance at the present time. She was referred to [facility] from Dr. Jana Hakim office. [facility] currently is not receiving Medicaid or self-pay clients,  but will review on a case by case basis and let pt know if they will see her. If they decide to see her , they will call patient and she may have to pay an out of pocket fee pt can then decide if she wants to pursue going. Melissa has spent quite a bit of time delving into this situation to provide this information. Message to be fwd to Gentry Fitz, NP

## 2015-05-21 NOTE — Progress Notes (Signed)
I faxed [phone] the [facility] forms/notes and mailed copy to patient for record. sharepoint noted and let patient know.

## 2015-05-22 ENCOUNTER — Telehealth: Payer: Self-pay | Admitting: Nurse Practitioner

## 2015-05-22 NOTE — Telephone Encounter (Signed)
1/23 pof noted,on 12/20 patient was to call for her ortho appointment,per other notes HIM faxed records also to [facility] yesterday.

## 2015-06-11 ENCOUNTER — Other Ambulatory Visit: Payer: Self-pay | Admitting: *Deleted

## 2015-07-06 ENCOUNTER — Telehealth: Payer: Self-pay | Admitting: Oncology

## 2015-07-06 ENCOUNTER — Other Ambulatory Visit: Payer: Self-pay | Admitting: *Deleted

## 2015-07-06 DIAGNOSIS — C50812 Malignant neoplasm of overlapping sites of left female breast: Secondary | ICD-10-CM

## 2015-07-06 MED ORDER — TAMOXIFEN CITRATE 20 MG PO TABS: 20.0000 mg | ORAL_TABLET | Freq: Every day | ORAL | Status: DC

## 2015-07-06 NOTE — Telephone Encounter (Signed)
pt cld to CX appt-stated no r/s at this time

## 2015-07-18 ENCOUNTER — Ambulatory Visit: Payer: 59 | Admitting: Oncology

## 2015-07-18 ENCOUNTER — Other Ambulatory Visit: Payer: 59

## 2015-08-14 ENCOUNTER — Telehealth: Payer: Self-pay | Admitting: *Deleted

## 2015-08-14 ENCOUNTER — Other Ambulatory Visit: Payer: Self-pay | Admitting: *Deleted

## 2015-08-14 DIAGNOSIS — C50812 Malignant neoplasm of overlapping sites of left female breast: Secondary | ICD-10-CM

## 2015-08-14 DIAGNOSIS — M79622 Pain in left upper arm: Secondary | ICD-10-CM

## 2015-08-14 DIAGNOSIS — I890 Lymphedema, not elsewhere classified: Secondary | ICD-10-CM

## 2015-08-14 NOTE — Telephone Encounter (Signed)
Call received in [place] from pt stating she is having ongoing issue with arm with history of lymph edema. [name] states arm is " painful and tight especially at the very top " " I need your office to send a referral to the lymphedema PT.  Pt denies any focal redness.  [name] states above has been ongoing for several weeks " and they taught me how to do that massage but I do not think I am doing right "  This RN will send a referral.  Above will also be sent to MD and RN at desk for any further recommendations.  Best number to return call given as [phone].

## 2015-08-17 ENCOUNTER — Ambulatory Visit: Payer: BLUE CROSS/BLUE SHIELD | Attending: Oncology | Admitting: Physical Therapy

## 2015-08-17 ENCOUNTER — Encounter: Payer: Self-pay | Admitting: Physical Therapy

## 2015-08-17 DIAGNOSIS — M25612 Stiffness of left shoulder, not elsewhere classified: Secondary | ICD-10-CM | POA: Insufficient documentation

## 2015-08-17 DIAGNOSIS — I972 Postmastectomy lymphedema syndrome: Secondary | ICD-10-CM | POA: Diagnosis present

## 2015-08-17 NOTE — Therapy (Signed)
[place], [place], [address] Phone: [phone]   Fax:  [phone]  Physical Therapy Evaluation  Patient Details  Name: [name] MRN: [id number] Date of Birth: 1975-10-06 Referring Provider: Magrinat  Encounter Date: 08/17/2015      PT End of Session - 08/17/15 0847    Visit Number 1   Number of Visits 25   Date for PT Re-Evaluation 10/12/15   PT Start Time 0801   PT Stop Time 0845   PT Time Calculation (min) 44 min   Activity Tolerance Patient tolerated treatment well   Behavior During Therapy [facility] for tasks assessed/performed      Past Medical History  Diagnosis Date  . Cancer ([place])     breast ca  . Malignant neoplasm of breast (female), unspecified site   . Radiation 10/24/13-12/09/13    Left chestwall/supraclav./scar  . GERD (gastroesophageal reflux disease)   . Back muscle spasm     occasional tx with skelaxin    Past Surgical History  Procedure Laterality Date  . Bilateral total mastectomy with axillary lymph node dissection    . Tonsillectomy    . Leep    . Portacath placement N/A 06/16/2013    Procedure: POWER PORT PLACEMENT;  Surgeon: Shann Medal, MD;  Location: [facility];  Service: General;  Laterality: N/A;  . Axillary lymph node dissection Left 06/16/2013    Procedure: LEFT AXILLARY NODE DISSECTION;  Surgeon: Shann Medal, MD;  Location: [facility];  Service: General;  Laterality: Left;  . Breast surgery    . Wisdom tooth extraction    . Right leg surgery      broken femur - has rod in leg  . Laparoscopic bilateral salpingo oopherectomy Bilateral 01/25/2014    Procedure: LAPAROSCOPIC BILATERAL SALPINGO OOPHORECTOMY;  Surgeon: Eldred Manges, MD;  Location: [facility];  Service: Gynecology;  Laterality: Bilateral;  . Port-a-cath removal N/A 01/25/2014    Procedure: REMOVAL PORT-A-CATH;  Surgeon: Alphonsa Overall, MD;  Location: [facility];  Service: General;  Laterality: N/A;     There were no vitals filed for this visit.       Subjective Assessment - 08/17/15 0807    Subjective I need to get this arm under control. The inside of my arm is just really sore. The black sleeve I wear during the day everyday. The one at night I wear maybe 2x/wk at night. It seems like that night sleeve gets so tight after I sleep for a while.    Pertinent History (1) status post bilateral mastectomies with left axillary lymph node sampling 6 nodes removed 01/02/2010 for a pT2 pN0(i+), stage IIA invasive lobular breast cancer, grade 3, estrogen and progesterone receptor positive, HER-2 not amplified; right breast was benign; status post completion left axillary lymph node dissection 06/16/2013 showing a total of 10/15 lymph nodes involved by tumor, with evidence of extracapsular extension (TX N3 = stage IIIC)   Patient Stated Goals get left UE as small as it will get   Currently in Pain? Yes   Pain Score 6    Pain Location Arm   Pain Orientation Left   Pain Descriptors / Indicators Constant;Aching;Dull   Pain Onset More than a month ago   Pain Frequency Constant   Aggravating Factors  when she gets up in the morning   Pain Relieving Factors nothing            [facility] PT Assessment - 08/17/15 0001  Assessment   Medical Diagnosis left breast cancer   Referring Provider Magrinat   Onset Date/Surgical Date 06/26/13   Hand Dominance Right   Prior Therapy lymphedema services in 2016   Precautions   Precautions Other (comment)  lymphedema   Restrictions   Weight Bearing Restrictions No   Balance Screen   Has the patient fallen in the past 6 months No   Has the patient had a decrease in activity level because of a fear of falling?  No   Is the patient reluctant to leave their home because of a fear of falling?  No   Home Environment   Living Environment Private residence   Living Arrangements Spouse/significant other   Available Help at Discharge Family   Type of [place] to enter   Entrance Stairs-Number of Steps 5   Entrance Stairs-Rails None   Home Layout One level   [place] None   Prior Function   Level of Independence Independent   Vocation On disability   Leisure pt exercises around 3x/wk for 45 min   Cognition   Overall Cognitive Status Within Functional Limits for tasks assessed   AROM   Right Shoulder Flexion 150 Degrees   Right Shoulder ABduction 145 Degrees   Right Shoulder Internal Rotation 64 Degrees   Right Shoulder External Rotation 90 Degrees   Left Shoulder Flexion 125 Degrees   Left Shoulder ABduction 99 Degrees   Left Shoulder Internal Rotation 54 Degrees   Left Shoulder External Rotation 64 Degrees           LYMPHEDEMA/ONCOLOGY QUESTIONNAIRE - 08/17/15 0815    Type   Cancer Type left breast cancer   Surgeries   Mastectomy Date 11/26/09  bilateral   Saline Implant Reconstruction Date 04/28/10   Other Surgery Date 04/28/13  axillary lymph node dissection   Number Lymph Nodes Removed 15   Date Lymphedema/Swelling Started   Date 04/28/13   Treatment   Active Chemotherapy Treatment No   Past Chemotherapy Treatment Yes   Date 10/26/13   Active Radiation Treatment No   Past Radiation Treatment Yes   Date 11/26/13   Body Site left chest and axilla   Current Hormone Treatment Yes   Date 03/28/14   Drug Name tamoxifin   Past Hormone Therapy No   What other symptoms do you have   Are you Having Heaviness or Tightness Yes   Are you having Pain Yes   Are you having pitting edema No   Is it Hard or Difficult finding clothes that fit Yes   Do you have infections No   Is there Decreased scar mobility Yes  in left axilla   Right Upper Extremity Lymphedema   15 cm Proximal to Olecranon Process 34.4 cm   10 cm Proximal to Olecranon Process 32.8 cm   Olecranon Process 27.8 cm   15 cm Proximal to Ulnar Styloid Process 27.4 cm   10 cm Proximal to Ulnar Styloid Process 24 cm   Just  Proximal to Ulnar Styloid Process 17.6 cm   Across Hand at [employer] 20 cm   At [place] of 2nd Digit 17 cm   Left Upper Extremity Lymphedema   15 cm Proximal to Olecranon Process 34 cm   10 cm Proximal to Olecranon Process 33.5 cm   Olecranon Process 28.9 cm   15 cm Proximal to Ulnar Styloid Process 29.1 cm   10 cm Proximal to Ulnar Styloid Process 25.4  cm   Just Proximal to Ulnar Styloid Process 18.8 cm   Across Hand at [employer] 19.9 cm   At [place] of 2nd Digit 6.8 cm                [facility] Adult PT Treatment/Exercise - 08/17/15 0001    Manual Therapy   Manual Therapy Compression Bandaging   Compression Bandaging TG soft from left hand to axilla, artiflex from hand to axilla with extra layers at antecubital fossa, 1- 6cm short stretch at hand, 1 - 8cm short stretch from wrist to axilla and 1- 10cm bandage from wrist to axilla                PT Education - 08/17/15 0900    Education provided Yes   Education Details remove bandages if increased pain or numbness, pt states fiancee knows how to reapply   Person(s) Educated Patient   Methods Explanation   Comprehension Verbalized understanding           Short Term Clinic Goals - 08/17/15 1207    CC Short Term Goal  #1   Title Pt to demonstrate 120 degrees of left shoulder abduction to allow improved use of LUE   Baseline 99   Time 4   Period Weeks   Status New             Long Term Clinic Goals - 08/17/15 0920    CC Long Term Goal  #1   Title Pt to receive trial of FlexiTouch compression pump for long term management of LUE edema   Baseline na   Time 8   Period Weeks   Status New   CC Long Term Goal  #2   Title Pt to be independent in a home exercise program for strengthening and ROM   Baseline na   Time 8   Period Weeks   Status New   CC Long Term Goal  #3   Title Pt will report decreased pain of 2/10 in LUE to improve LUE function   Baseline 6/10   Time 8   Period Weeks   Status  New   CC Long Term Goal  #4   Title Pt to demonstrate 150 degrees of left shoulder flexion to allow her to reach items overhead   Baseline 125   Time 8   Period Weeks   Status New   CC Long Term Goal  #5   Title Pt to demonstrate a 1 cm decrease in circumferential measurements 15 cm proximal to ular styloid process to decrease risk of cellulitis   Baseline 29.1   Time 8   Period Weeks   Status New   CC Long Term Goal  #6   Title Pt to demonstrate a 1 cm decrease in circumferential measurements 10 cm proximal to ulnar styloid process to decrease risk of cellulitis   Baseline 25.4   Time 8   Period Weeks   Status New   Additional Goals   Additional Goals Yes            Plan - 08/17/15 0905    Clinical Impression Statement Pt with history of bilateral mastectomy following left sided breast cancer in 2011. In 2015 pt underwent an axillary node dissection on left. Pt has a history of lymphedema that recently she has not been able to control with her compression garments. She states her reid sleeve feels too tight at night and she is only able to wear it  two nights out of the week. Her increased edema is casuing pain in her medial left arm. She also has decreased ROM bilaterally but most notably of her left shoulder. Pt would benefit from skilled PT services to decrease LUE edema, find pt an appropriate compression garment to manage her edema and increase left shoulder ROM and strength.    Rehab Potential Excellent   Clinical Impairments Affecting Rehab Potential none   PT Frequency 3x / week   PT Duration 8 weeks   PT Treatment/Interventions Manual lymph drainage;Manual techniques;Therapeutic exercise;Vasopneumatic Device;Taping;Passive range of motion;Scar mobilization;Compression bandaging;ADLs/Self Care Home Management   PT Next Visit Plan give LLIS, begin MLD to LUE, continue with compression bandaging, add ROM exercises to an HEP   Consulted and Agree with Plan of Care Patient       Patient will benefit from skilled therapeutic intervention in order to improve the following deficits and impairments:  Decreased range of motion, Impaired UE functional use, Pain, Increased fascial restricitons, Decreased strength, Increased edema, Decreased scar mobility  Visit Diagnosis: Postmastectomy lymphedema     Problem List Patient Active Problem List   Diagnosis Date Noted  . Bulge of cervical disc without myelopathy 04/17/2015  . Chest skin lesion 09/20/2014  . Hot flashes 08/08/2014  . Weight gain 08/08/2014  . Endometriosis of ovary 01/25/2014  . Fibroids, subserous 01/25/2014  . Lymphedema of arm 01/09/2014  . Chemotherapy induced neutropenia ([place]) 10/10/2013  . Lower back pain 10/10/2013  . Bilateral lower extremity edema 09/08/2013  . Anxiety 09/08/2013  . Depression 08/10/2013  . Anemia, unspecified 07/20/2013  . Pain in left axilla 07/06/2013  . Breast cancer, left breast ([place]) 05/11/2013    Alexia Freestone 08/17/2015, 12:12 PM  [place], [place], [address] Phone: [phone]   Fax:  [phone]  Name: [name] MRN: [id number] Date of Birth: 1975-09-12   Allyson Sabal, PT 08/17/2015 12:12 PM

## 2015-08-21 ENCOUNTER — Ambulatory Visit: Payer: BLUE CROSS/BLUE SHIELD | Admitting: Physical Therapy

## 2015-08-21 DIAGNOSIS — I972 Postmastectomy lymphedema syndrome: Secondary | ICD-10-CM | POA: Diagnosis not present

## 2015-08-21 DIAGNOSIS — M25612 Stiffness of left shoulder, not elsewhere classified: Secondary | ICD-10-CM

## 2015-08-21 NOTE — Therapy (Signed)
[place], [place], [address] Phone: [phone]   Fax:  [phone]  Physical Therapy Treatment  Patient Details  Name: [name] MRN: [id number] Date of Birth: July 02, 1975 Referring Provider: Magrinat  Encounter Date: 08/21/2015      PT End of Session - 08/21/15 1453    Visit Number 2   Number of Visits 25   Date for PT Re-Evaluation 10/12/15   PT Start Time 1350   PT Stop Time 1435   PT Time Calculation (min) 45 min   Activity Tolerance Patient tolerated treatment well   Behavior During Therapy [facility] for tasks assessed/performed      Past Medical History  Diagnosis Date  . Cancer ([place])     breast ca  . Malignant neoplasm of breast (female), unspecified site   . Radiation 10/24/13-12/09/13    Left chestwall/supraclav./scar  . GERD (gastroesophageal reflux disease)   . Back muscle spasm     occasional tx with skelaxin    Past Surgical History  Procedure Laterality Date  . Bilateral total mastectomy with axillary lymph node dissection    . Tonsillectomy    . Leep    . Portacath placement N/A 06/16/2013    Procedure: POWER PORT PLACEMENT;  Surgeon: Shann Medal, MD;  [facility];  Service: General;  Laterality: N/A;  . Axillary lymph node dissection Left 06/16/2013    Procedure: LEFT AXILLARY NODE DISSECTION;  Surgeon: Shann Medal, MD;  [facility]: [facility];  Service: General;  Laterality: Left;  . Breast surgery    . Wisdom tooth extraction    . Right leg surgery      broken femur - has rod in leg  . Laparoscopic bilateral salpingo oopherectomy Bilateral 01/25/2014    Procedure: LAPAROSCOPIC BILATERAL SALPINGO OOPHORECTOMY;  Surgeon: Eldred Manges, MD;  Location: [facility];  Service: Gynecology;  Laterality: Bilateral;  . Port-a-cath removal N/A 01/25/2014    Procedure: REMOVAL PORT-A-CATH;  Surgeon: Alphonsa Overall, MD;  Location: [facility];  Service: General;  Laterality: N/A;     There were no vitals filed for this visit.      Subjective Assessment - 08/21/15 1352    Subjective The pain is definitely better than it was last week. She still has some pain in forearm and tightness in chest   She is having a sales rep for the pump come to her house tomorrow and she is excited to try that.  She does not want to get wrapped today, but just receive the manual lymph drainage    Pertinent History (1) status post bilateral mastectomies with left axillary lymph node sampling 6 nodes removed 01/02/2010 for a pT2 pN0(i+), stage IIA invasive lobular breast cancer, grade 3, estrogen and progesterone receptor positive, HER-2 not amplified; right breast was benign; status post completion left axillary lymph node dissection 06/16/2013 showing a total of 10/15 lymph nodes involved by tumor, with evidence of extracapsular extension (TX N3 = stage IIIC) Pt has bilateral breast implants    Patient Stated Goals get left UE as small as it will get   Currently in Pain? Yes   Pain Score 4    Pain Location Arm   Pain Orientation Left;Lower;Medial   Pain Descriptors / Indicators Constant;Aching;Dull   Pain Type Chronic pain   Pain Onset More than a month ago            [facility] PT Assessment - 08/21/15 0001    Functional Tests  Functional tests --  lymphedema life impact scale score 43 or 63% impaired                      [facility] Adult PT Treatment/Exercise - 08/21/15 0001    Self-Care   Self-Care Other Self-Care Comments   Other Self-Care Comments  issued flyers for [place] and PREP program for pt to consider  also gave flyer about flexitouch    Shoulder Exercises: Supine   Protraction AROM;Left;10 reps   Other Supine Exercises dowle rod within pain limits   Shoulder Exercises: Sidelying   External Rotation AROM;Left;10 reps   ABduction AROM;Left   ABduction Limitations pt only able to get to 90 degrees, limited by pain    Other Sidelying Exercises small circles  in each direction     Other Sidelying Exercises scapular retraction and protraction    Manual Therapy   Manual Lymphatic Drainage (MLD) In supine, short neck, superficial and deep abdominals. right axiilary nodes and anterior ineraxillary anastamosis, left axilla, chest and abdomen,left shdoulder, upper arm, lower arm with reurn along pathways, then to right sidelying for posterior interaxillay anastamosis, left axilla and repeat of arm .                 PT Education - 08/21/15 1452    Education provided Yes   Education Details [employer] program and PREP program at he [employer]) Educated Patient   Methods Explanation;Demonstration;Handout   Comprehension Verbalized understanding           Short Term Clinic Goals - 08/17/15 1207    CC Short Term Goal  #1   Title Pt to demonstrate 120 degrees of left shoulder abduction to allow improved use of LUE   Baseline 99   Time 4   Period Weeks   Status New             Long Term Clinic Goals - 08/17/15 0920    CC Long Term Goal  #1   Title Pt to receive trial of FlexiTouch compression pump for long term management of LUE edema   Baseline na   Time 8   Period Weeks   Status New   CC Long Term Goal  #2   Title Pt to be independent in a home exercise program for strengthening and ROM   Baseline na   Time 8   Period Weeks   Status New   CC Long Term Goal  #3   Title Pt will report decreased pain of 2/10 in LUE to improve LUE function   Baseline 6/10   Time 8   Period Weeks   Status New   CC Long Term Goal  #4   Title Pt to demonstrate 150 degrees of left shoulder flexion to allow her to reach items overhead   Baseline 125   Time 8   Period Weeks   Status New   CC Long Term Goal  #5   Title Pt to demonstrate a 1 cm decrease in circumferential measurements 15 cm proximal to ular styloid process to decrease risk of cellulitis   Baseline 29.1   Time 8   Period Weeks   Status New   CC Long Term Goal  #6    Title Pt to demonstrate a 1 cm decrease in circumferential measurements 10 cm proximal to ulnar styloid process to decrease risk of cellulitis   Baseline 25.4   Time 8   Period Weeks   Status  New   Additional Goals   Additional Goals Yes            Plan - 08/21/15 1453    Clinical Impression Statement Pt report she felt better after session of manual lymph draiange and beginning exercise, but she has expressions of pain when attempting shoulder range of motion. Encouraged pt to move arm as much as possible within her pain limits at home .  Pt did not want to bandage today    Rehab Potential Excellent   Clinical Impairments Affecting Rehab Potential previous radiation and lymphedema in left arm.    PT Frequency 3x / week   PT Duration 8 weeks   PT Treatment/Interventions Manual lymph drainage;Manual techniques;Therapeutic exercise;Vasopneumatic Device;Taping;Passive range of motion;Scar mobilization;Compression bandaging;ADLs/Self Care Home Management   PT Next Visit Plan  remeasuree find out plan for pump,  MLD to LUE, encourage continuation with compression bandaging(ask pt to bring them back ) , add ROM exercises to an HEP progress scpaular mobility  exercise, consider trial of taping at axilla or forearm    Consulted and Agree with Plan of Care Patient      Patient will benefit from skilled therapeutic intervention in order to improve the following deficits and impairments:  Decreased range of motion, Impaired UE functional use, Pain, Increased fascial restricitons, Decreased strength, Increased edema, Decreased scar mobility  Visit Diagnosis: Postmastectomy lymphedema  Shoulder joint stiffness, left     Problem List Patient Active Problem List   Diagnosis Date Noted  . Bulge of cervical disc without myelopathy 04/17/2015  . Chest skin lesion 09/20/2014  . Hot flashes 08/08/2014  . Weight gain 08/08/2014  . Endometriosis of ovary 01/25/2014  . Fibroids, subserous  01/25/2014  . Lymphedema of arm 01/09/2014  . Chemotherapy induced neutropenia ([place]) 10/10/2013  . Lower back pain 10/10/2013  . Bilateral lower extremity edema 09/08/2013  . Anxiety 09/08/2013  . Depression 08/10/2013  . Anemia, unspecified 07/20/2013  . Pain in left axilla 07/06/2013  . Breast cancer, left breast ([place]) 05/11/2013   Donato Heinz. Owens Shark, PT  08/21/2015, 3:00 PM  [place], [place], [address] Phone: [phone]   Fax:  [phone]  Name: [name] MRN: [id number] Date of Birth: Apr 11, 1976

## 2015-08-22 ENCOUNTER — Telehealth: Payer: Self-pay | Admitting: Oncology

## 2015-08-22 NOTE — Telephone Encounter (Signed)
pt called to cx appt....she was requesting an appt b4 5.22...no appts available....pt still cx and will call us back to r/s

## 2015-08-23 ENCOUNTER — Ambulatory Visit: Payer: BLUE CROSS/BLUE SHIELD | Admitting: Physical Therapy

## 2015-08-23 ENCOUNTER — Encounter: Payer: Self-pay | Admitting: Physical Therapy

## 2015-08-23 DIAGNOSIS — I972 Postmastectomy lymphedema syndrome: Secondary | ICD-10-CM | POA: Diagnosis not present

## 2015-08-23 DIAGNOSIS — M25612 Stiffness of left shoulder, not elsewhere classified: Secondary | ICD-10-CM

## 2015-08-23 NOTE — Therapy (Signed)
[place], [place], [address] Phone: [phone]   Fax:  [phone]  Physical Therapy Treatment  Patient Details  Name: [name] MRN: [id number] Date of Birth: 08/16/1975 Referring Provider: Magrinat  Encounter Date: 08/23/2015      PT End of Session - 08/23/15 1737    Visit Number 3   Number of Visits 25   Date for PT Re-Evaluation 10/12/15   PT Start Time 1350   PT Stop Time 1430   PT Time Calculation (min) 40 min   Activity Tolerance Patient tolerated treatment well   Behavior During Therapy [facility] for tasks assessed/performed      Past Medical History  Diagnosis Date  . Cancer ([place])     breast ca  . Malignant neoplasm of breast (female), unspecified site   . Radiation 10/24/13-12/09/13    Left chestwall/supraclav./scar  . GERD (gastroesophageal reflux disease)   . Back muscle spasm     occasional tx with skelaxin    Past Surgical History  Procedure Laterality Date  . Bilateral total mastectomy with axillary lymph node dissection    . Tonsillectomy    . Leep    . Portacath placement N/A 06/16/2013    Procedure: POWER PORT PLACEMENT;  Surgeon: Shann Medal, MD;  Location: [facility];  Service: General;  Laterality: N/A;  . Axillary lymph node dissection Left 06/16/2013    Procedure: LEFT AXILLARY NODE DISSECTION;  Surgeon: Shann Medal, MD;  Location: [facility];  Service: General;  Laterality: Left;  . Breast surgery    . Wisdom tooth extraction    . Right leg surgery      broken femur - has rod in leg  . Laparoscopic bilateral salpingo oopherectomy Bilateral 01/25/2014    Procedure: LAPAROSCOPIC BILATERAL SALPINGO OOPHORECTOMY;  Surgeon: Eldred Manges, MD;  Location: [facility];  Service: Gynecology;  Laterality: Bilateral;  . Port-a-cath removal N/A 01/25/2014    Procedure: REMOVAL PORT-A-CATH;  Surgeon: Alphonsa Overall, MD;  Location: [facility];  Service: General;  Laterality: N/A;     There were no vitals filed for this visit.      Subjective Assessment - 08/23/15 1354    Subjective My arm is not in as much pain as last time. I think the bandaging is helping. She forgot her bandages today.    Pertinent History (1) status post bilateral mastectomies with left axillary lymph node sampling 6 nodes removed 01/02/2010 for a pT2 pN0(i+), stage IIA invasive lobular breast cancer, grade 3, estrogen and progesterone receptor positive, HER-2 not amplified; right breast was benign; status post completion left axillary lymph node dissection 06/16/2013 showing a total of 10/15 lymph nodes involved by tumor, with evidence of extracapsular extension (TX N3 = stage IIIC) Pt has bilateral breast implants    Patient Stated Goals get left UE as small as it will get   Currently in Pain? Yes   Pain Score 3    Pain Location Arm   Pain Orientation Left   Pain Descriptors / Indicators Dull;Constant   Pain Type Chronic pain   Pain Onset More than a month ago               LYMPHEDEMA/ONCOLOGY QUESTIONNAIRE - 08/23/15 1359    Left Upper Extremity Lymphedema   15 cm Proximal to Olecranon Process 34 cm   10 cm Proximal to Olecranon Process 33.5 cm   Olecranon Process 29 cm   15 cm Proximal to Ulnar Styloid Process  28.4 cm   10 cm Proximal to Ulnar Styloid Process 24.7 cm   Just Proximal to Ulnar Styloid Process 18.8 cm   Across Hand at [employer] 20 cm   At [place] of 2nd Digit 6.5 cm                  [facility] Adult PT Treatment/Exercise - 08/23/15 0001    Shoulder Exercises: Standing   Other Standing Exercises L pec stretch with arm extended on wall rotating body away with 30 sec hold   Manual Therapy   Manual Therapy Passive ROM;Soft tissue mobilization;Muscle Energy Technique   Soft tissue mobilization to band of tightness in left chest extending from axilla, in axilla and lateral trunk   Manual Lymphatic Drainage (MLD) --   Passive ROM to LUE in direction of ER,  flexion and abduction with pt limited by pain and tightness   Muscle Energy Technique contract/relax to LUE shoulder external rotators                   Short Term Clinic Goals - 08/23/15 1403    CC Short Term Goal  #1   Title Pt to demonstrate 120 degrees of left shoulder abduction to allow improved use of LUE   Baseline 99, 08/23/15 - 118 degrees   Time 4   Period Weeks   Status New             Long Term Clinic Goals - 08/17/15 0920    CC Long Term Goal  #1   Title Pt to receive trial of FlexiTouch compression pump for long term management of LUE edema   Baseline na   Time 8   Period Weeks   Status New   CC Long Term Goal  #2   Title Pt to be independent in a home exercise program for strengthening and ROM   Baseline na   Time 8   Period Weeks   Status New   CC Long Term Goal  #3   Title Pt will report decreased pain of 2/10 in LUE to improve LUE function   Baseline 6/10   Time 8   Period Weeks   Status New   CC Long Term Goal  #4   Title Pt to demonstrate 150 degrees of left shoulder flexion to allow her to reach items overhead   Baseline 125   Time 8   Period Weeks   Status New   CC Long Term Goal  #5   Title Pt to demonstrate a 1 cm decrease in circumferential measurements 15 cm proximal to ular styloid process to decrease risk of cellulitis   Baseline 29.1   Time 8   Period Weeks   Status New   CC Long Term Goal  #6   Title Pt to demonstrate a 1 cm decrease in circumferential measurements 10 cm proximal to ulnar styloid process to decrease risk of cellulitis   Baseline 25.4   Time 8   Period Weeks   Status New   Additional Goals   Additional Goals Yes            Plan - 08/23/15 1741    Clinical Impression Statement Pt did not bring bandages to this session. Measurements were taken today with minimal change since eval most likely attributed to patient not having bandages intact since Sunday. Focus today was on manual therapy to help  improve left shoulder ROM which is very limited by tighness of left chest.  Contract relax performed on left shoulder external rotators with improvement in ROM noted immediately after.    Rehab Potential Excellent   Clinical Impairments Affecting Rehab Potential previous radiation and lymphedema in left arm.    PT Frequency 3x / week   PT Duration 8 weeks   PT Treatment/Interventions Manual lymph drainage;Manual techniques;Therapeutic exercise;Vasopneumatic Device;Taping;Passive range of motion;Scar mobilization;Compression bandaging;ADLs/Self Care Home Management   PT Next Visit Plan assess goals, ask pt to bring compression bandages, continue manual therapy to LUE to improve ROM and add ROM exercises as pt progresses   Consulted and Agree with Plan of Care Patient      Patient will benefit from skilled therapeutic intervention in order to improve the following deficits and impairments:  Decreased range of motion, Impaired UE functional use, Pain, Increased fascial restricitons, Decreased strength, Increased edema, Decreased scar mobility  Visit Diagnosis: Postmastectomy lymphedema  Shoulder joint stiffness, left     Problem List Patient Active Problem List   Diagnosis Date Noted  . Bulge of cervical disc without myelopathy 04/17/2015  . Chest skin lesion 09/20/2014  . Hot flashes 08/08/2014  . Weight gain 08/08/2014  . Endometriosis of ovary 01/25/2014  . Fibroids, subserous 01/25/2014  . Lymphedema of arm 01/09/2014  . Chemotherapy induced neutropenia ([place]) 10/10/2013  . Lower back pain 10/10/2013  . Bilateral lower extremity edema 09/08/2013  . Anxiety 09/08/2013  . Depression 08/10/2013  . Anemia, unspecified 07/20/2013  . Pain in left axilla 07/06/2013  . Breast cancer, left breast ([place]) 05/11/2013    Alexia Freestone 08/23/2015, 5:46 PM  [place], [place], [address] Phone: [phone]    Fax:  [phone]  Name: [name] MRN: [id number] Date of Birth: 1976-03-27    Allyson Sabal, PT 08/23/2015 5:46 PM

## 2015-08-28 ENCOUNTER — Ambulatory Visit: Payer: BLUE CROSS/BLUE SHIELD | Attending: Oncology

## 2015-08-28 DIAGNOSIS — M25612 Stiffness of left shoulder, not elsewhere classified: Secondary | ICD-10-CM | POA: Insufficient documentation

## 2015-08-28 DIAGNOSIS — I972 Postmastectomy lymphedema syndrome: Secondary | ICD-10-CM | POA: Insufficient documentation

## 2015-08-28 NOTE — Therapy (Addendum)
[place], [place], [address] Phone: [phone]   Fax:  [phone]  Physical Therapy Treatment  Patient Details  Name: [name] MRN: [id number] Date of Birth: [birth year] Referring Provider: Magrinat  Encounter Date: 08/28/2015      PT End of Session - 08/28/15 1114    Visit Number 4   Number of Visits 25   Date for PT Re-Evaluation 10/12/15   PT Start Time 8527   PT Stop Time 1104   PT Time Calculation (min) 50 min   Activity Tolerance Patient tolerated treatment well   Behavior During Therapy [facility] for tasks assessed/performed      Past Medical History  Diagnosis Date  . Cancer ([place])     breast ca  . Malignant neoplasm of breast (female), unspecified site   . Radiation 10/24/13-12/09/13    Left chestwall/supraclav./scar  . GERD (gastroesophageal reflux disease)   . Back muscle spasm     occasional tx with skelaxin    Past Surgical History  Procedure Laterality Date  . Bilateral total mastectomy with axillary lymph node dissection    . Tonsillectomy    . Leep    . Portacath placement N/A 06/16/2013    Procedure: POWER PORT PLACEMENT;  Surgeon: Shann Medal, MD;  Location: [facility];  Service: General;  Laterality: N/A;  . Axillary lymph node dissection Left 06/16/2013    Procedure: LEFT AXILLARY NODE DISSECTION;  Surgeon: Shann Medal, MD;  Location: [facility];  Service: General;  Laterality: Left;  . Breast surgery    . Wisdom tooth extraction    . Right leg surgery      broken femur - has rod in leg  . Laparoscopic bilateral salpingo oopherectomy Bilateral 01/25/2014    Procedure: LAPAROSCOPIC BILATERAL SALPINGO OOPHORECTOMY;  Surgeon: Eldred Manges, MD;  Location: [facility];  Service: Gynecology;  Laterality: Bilateral;  . Port-a-cath removal N/A 01/25/2014    Procedure: REMOVAL PORT-A-CATH;  Surgeon: Alphonsa Overall, MD;  Location: [facility];  Service: General;  Laterality: N/A;     There were no vitals filed for this visit.      Subjective Assessment - 08/28/15 1018    Subjective I didn't realize I needed to bring my bandages each time. I was sore after last visit but after iteasaed off I felt like my motion was a little better. My Flexitouch is coming today! I will set up the demo as soon as I can.    Pertinent History (1) status post bilateral mastectomies with left axillary lymph node sampling 6 nodes removed 01/02/2010 for a pT2 pN0(i+), stage IIA invasive lobular breast cancer, grade 3, estrogen and progesterone receptor positive, HER-2 not amplified; right breast was benign; status post completion left axillary lymph node dissection 06/16/2013 showing a total of 10/15 lymph nodes involved by tumor, with evidence of extracapsular extension (TX N3 = stage IIIC) Pt has bilateral breast implants    Patient Stated Goals get left UE as small as it will get   Currently in Pain? No/denies            [facility] PT Assessment - 08/28/15 0001    AROM   Right Shoulder Flexion 157 Degrees   Right Shoulder ABduction 158 Degrees   Left Shoulder Flexion 142 Degrees   Left Shoulder ABduction 113 Degrees                     [facility] Adult PT Treatment/Exercise - 08/28/15  0001    Shoulder Exercises: Pulleys   Flexion 2 minutes   ABduction 2 minutes   Shoulder Exercises: Therapy Ball   Flexion 10 reps   ABduction 10 reps   Manual Therapy   Soft tissue mobilization --   Myofascial Release To Lt chest wall and axilla with PROM and at inferior Lt breasat where pt c/o intermittent tightness during stretching.   Passive ROM to LUE in direction of ER, flexion and abduction with pt limited by pain and tightness   Muscle Energy Technique contract/relax to LUE shoulder external rotators                PT Education - 08/28/15 1117    Education provided Yes   Education Details How to make pulleys for home use (over the door wreath hanger and jump rope), and to  use ball on wall for end ROM stertching and/or door frame and to incorporate stretches throughout her day.   Methods Explanation;Demonstration   Comprehension Verbalized understanding;Returned demonstration;Need further instruction           Short Term Clinic Goals - 08/28/15 1118    CC Short Term Goal  #1   Title Pt to demonstrate 120 degrees of left shoulder abduction to allow improved use of LUE   Baseline 99, 08/23/15 - 118 degrees, 113 degrees 08/28/15   Status On-going             Long Term Clinic Goals - 08/28/15 1119    CC Long Term Goal  #1   Title Pt to receive trial of FlexiTouch compression pump for long term management of LUE edema  Pts pump to arrive today, then she will set up demo.    Status On-going   CC Long Term Goal  #2   Title Pt to be independent in a home exercise program for strengthening and ROM   Status On-going   CC Long Term Goal  #3   Title Pt will report decreased pain of 2/10 in LUE to improve LUE function   Status On-going   CC Long Term Goal  #4   Title Pt to demonstrate 150 degrees of left shoulder flexion to allow her to reach items overhead   Baseline 125, 142 degrees 08/28/15   Status On-going   CC Long Term Goal  #5   Title Pt to demonstrate a 1 cm decrease in circumferential measurements 15 cm proximal to ular styloid process to decrease risk of cellulitis   Status On-going   CC Long Term Goal  #6   Title Pt to demonstrate a 1 cm decrease in circumferential measurements 10 cm proximal to ulnar styloid process to decrease risk of cellulitis   Status On-going            Plan - 08/28/15 1114    Clinical Impression Statement Pt did not bring bandages again reporting she didn't know we were going to rewrap her each time. Instructed pt we would like to do this until she no longer reduces with her circumference measurements and then order her her new sleeve. Pts AROM bil shoulder mesurements all improved today, though she does still have  pain at Lt UE end ROM, her Rt shoulder she reports has been feeling much better. Pt did don her compression sleeve after session, so even though we didn't bandage she did have some compression on her UE, though she is in need of getting a new one when it is time as this one is about  42 years old.    Rehab Potential Excellent   Clinical Impairments Affecting Rehab Potential previous radiation and lymphedema in left arm.    PT Frequency 3x / week   PT Duration 8 weeks   PT Treatment/Interventions Manual lymph drainage;Manual techniques;Therapeutic exercise;Vasopneumatic Device;Taping;Passive range of motion;Scar mobilization;Compression bandaging;ADLs/Self Care Home Management   PT Next Visit Plan Cont complete decongestive therapy, but definitely bandaging as pt now has the Flexitouch at home and will be able to use that between sessions when bandages come off, also continue manual therapy to LUE to improve ROM and add ROM exercises as pt progresses.   Consulted and Agree with Plan of Care Patient      Patient will benefit from skilled therapeutic intervention in order to improve the following deficits and impairments:  Decreased range of motion, Impaired UE functional use, Pain, Increased fascial restricitons, Decreased strength, Increased edema, Decreased scar mobility  Visit Diagnosis: Postmastectomy lymphedema  Shoulder joint stiffness, left     Problem List Patient Active Problem List   Diagnosis Date Noted  . Bulge of cervical disc without myelopathy 04/17/2015  . Chest skin lesion 09/20/2014  . Hot flashes 08/08/2014  . Weight gain 08/08/2014  . Endometriosis of ovary 01/25/2014  . Fibroids, subserous 01/25/2014  . Lymphedema of arm 01/09/2014  . Chemotherapy induced neutropenia ([place]) 10/10/2013  . Lower back pain 10/10/2013  . Bilateral lower extremity edema 09/08/2013  . Anxiety 09/08/2013  . Depression 08/10/2013  . Anemia, unspecified 07/20/2013  . Pain in left axilla  07/06/2013  . Breast cancer, left breast ([place]) 05/11/2013    Otelia Limes, PTA 08/28/2015, [place], [place], [address] Phone: [phone]   Fax:  [phone]  Name: [name] MRN: [id number] Date of Birth: October 23, 1975   PHYSICAL THERAPY DISCHARGE SUMMARY  Visits from Start of Care: 4  Current functional level related to goals / functional outcomes: Pt received trial of FlexiTouch and also had a compression sleeve. See above.   Remaining deficits: See above   Education / Equipment: Importance of compression for management of edema Plan: Patient agrees to discharge.  Patient goals were not met. Patient is being discharged due to not returning since the last visit.  ?????    Allyson Sabal, PT 03/17/16 11:50 AM

## 2015-08-31 ENCOUNTER — Encounter: Payer: Self-pay | Admitting: Oncology

## 2015-08-31 NOTE — Progress Notes (Signed)
Fax sent 04/02/15 I sent to medical records

## 2015-09-03 ENCOUNTER — Encounter: Payer: BLUE CROSS/BLUE SHIELD | Admitting: Physical Therapy

## 2015-09-05 ENCOUNTER — Other Ambulatory Visit: Payer: Self-pay | Admitting: *Deleted

## 2015-09-05 ENCOUNTER — Ambulatory Visit: Payer: BLUE CROSS/BLUE SHIELD | Admitting: Physical Therapy

## 2015-09-05 DIAGNOSIS — C50812 Malignant neoplasm of overlapping sites of left female breast: Secondary | ICD-10-CM

## 2015-09-06 ENCOUNTER — Telehealth: Payer: Self-pay | Admitting: Nurse Practitioner

## 2015-09-06 ENCOUNTER — Encounter: Payer: Self-pay | Admitting: Nurse Practitioner

## 2015-09-06 ENCOUNTER — Other Ambulatory Visit (HOSPITAL_BASED_OUTPATIENT_CLINIC_OR_DEPARTMENT_OTHER): Payer: BLUE CROSS/BLUE SHIELD

## 2015-09-06 ENCOUNTER — Ambulatory Visit (HOSPITAL_BASED_OUTPATIENT_CLINIC_OR_DEPARTMENT_OTHER): Payer: BLUE CROSS/BLUE SHIELD | Admitting: Nurse Practitioner

## 2015-09-06 VITALS — BP 135/96 | HR 89 | Temp 98.6°F | Resp 18 | Wt 219.6 lb

## 2015-09-06 DIAGNOSIS — C50812 Malignant neoplasm of overlapping sites of left female breast: Secondary | ICD-10-CM

## 2015-09-06 DIAGNOSIS — I890 Lymphedema, not elsewhere classified: Secondary | ICD-10-CM | POA: Diagnosis not present

## 2015-09-06 DIAGNOSIS — R232 Flushing: Secondary | ICD-10-CM

## 2015-09-06 DIAGNOSIS — M545 Low back pain: Secondary | ICD-10-CM

## 2015-09-06 DIAGNOSIS — C50912 Malignant neoplasm of unspecified site of left female breast: Secondary | ICD-10-CM | POA: Diagnosis not present

## 2015-09-06 DIAGNOSIS — C778 Secondary and unspecified malignant neoplasm of lymph nodes of multiple regions: Secondary | ICD-10-CM

## 2015-09-06 DIAGNOSIS — N951 Menopausal and female climacteric states: Secondary | ICD-10-CM

## 2015-09-06 LAB — CBC WITH DIFFERENTIAL/PLATELET
BASO%: 0.2 % (ref 0.0–2.0)
Basophils Absolute: 0 10*3/uL (ref 0.0–0.1)
EOS ABS: 0.1 10*3/uL (ref 0.0–0.5)
EOS%: 2.3 % (ref 0.0–7.0)
HEMATOCRIT: 37 % (ref 34.8–46.6)
HEMOGLOBIN: 11.9 g/dL (ref 11.6–15.9)
LYMPH%: 49.1 % (ref 14.0–49.7)
MCH: 27.6 pg (ref 25.1–34.0)
MCHC: 32.2 g/dL (ref 31.5–36.0)
MCV: 85.8 fL (ref 79.5–101.0)
MONO#: 0.3 10*3/uL (ref 0.1–0.9)
MONO%: 4.5 % (ref 0.0–14.0)
NEUT%: 43.9 % (ref 38.4–76.8)
NEUTROS ABS: 2.5 10*3/uL (ref 1.5–6.5)
PLATELETS: 225 10*3/uL (ref 145–400)
RBC: 4.31 10*6/uL (ref 3.70–5.45)
RDW: 13.8 % (ref 11.2–14.5)
WBC: 5.8 10*3/uL (ref 3.9–10.3)
lymph#: 2.8 10*3/uL (ref 0.9–3.3)

## 2015-09-06 LAB — COMPREHENSIVE METABOLIC PANEL
ALBUMIN: 3.5 g/dL (ref 3.5–5.0)
ALK PHOS: 64 U/L (ref 40–150)
ALT: 12 U/L (ref 0–55)
ANION GAP: 6 meq/L (ref 3–11)
AST: 13 U/L (ref 5–34)
BUN: 14.2 mg/dL (ref 7.0–26.0)
CALCIUM: 10.1 mg/dL (ref 8.4–10.4)
CO2: 26 mEq/L (ref 22–29)
Chloride: 108 mEq/L (ref 98–109)
Creatinine: 0.9 mg/dL (ref 0.6–1.1)
GLUCOSE: 105 mg/dL (ref 70–140)
POTASSIUM: 3.8 meq/L (ref 3.5–5.1)
SODIUM: 140 meq/L (ref 136–145)
TOTAL PROTEIN: 7.4 g/dL (ref 6.4–8.3)

## 2015-09-06 NOTE — Progress Notes (Signed)
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 A biopsy of the mass in question [facility] showed ([id number]) and invasive lobular carcinoma, which was estrogen and progesterone receptor both 100% positive, both with strong staining intensity, but HER-2 negative at 1+. Bilateral breast MRI 12/13/2009 in [place] showed in the left breast an irregularly marginated mass measuring 3.2 cm. There were 4 or 5 separate satellite lesions laterally and superior to the primary.  Accordingly, after appropriate discussion, the patient underwent bilateral mastectomies with left sentinel lymph node sampling 01/02/2010. The right breast was benign. The left breast showed a 2.3 cm invasive lobular carcinoma, grade 3, with a total of 6 sentinel lymph nodes removed, all negative, although 2 showed isolated tumor cells by immunostaining. Margins were negative.  An Oncotype BX was sent, with a recurrence score of 22, predicting a risk of distant recurrence within 10 years with 14% of the patients only systemic treatment was tamoxifen. The patient was treated with CMF chemotherapy between 02/22/2010 and 07/05/2010, receiving 7 cycles at which point the patient refused further chemotherapy though there have been no major toxicities at according to the oncology note from Dr. Brent General. The patient was then started on tamoxifen 08/09/2010. By her  cancer took approximately 12-15 months then stopped because of hot flashes and insomnia problems.  More recently, the patient noted a change in her left axilla andunderwent bilateral breast MRI January to 2015 at [place]. The patient has bilateral submuscular silicone implants in place. The breast were unremarkable, but there were several lymph nodes with cortical thickening in the left axilla including a dominant 1 measuring 1.2 cm in the short axis. Ultrasonography of this area identified the lymph node in question and the patient underwent biopsy of the left axillary lymph node This showed ([id number]) near-total replacement of the lymph node by metastatic carcinoma. This was 96% estrogen receptor positive, and 84% progesterone receptor positive, both with strong staining intensity. The MIB-1 was 20%. HER-2 determination is pending.  The patient's subsequent history is as detailed below  INTERVAL HISTORY: [name] returns today for follow up of her breast cancer. She continues on tamoxifen daily, with hot flashes as her main complaint. She is on 155m venlafaxine daily and gabapentin 3021mQHS. She denies any vaginal changes. The interval history is generally unremarkable. She continues to struggle to lose weight. She exercises about 4 times weekly. She continues to have back pain for which she takes 80048mkelaxin several times daily. She has visited an orthopedic specialist who suggests an epidural to the area, but she is too afraid of the needle to go through with it.   REVIEW OF SYSTEMS: [name] to wear a compression sleeve for her lymphedema. A detailed review of systems is otherwise entirely negative, except where noted above.   PAST MEDICAL HISTORY: Past Medical History  Diagnosis Date  . [place]   [place] [facility]  . Malignant neoplasm of breast (female), unspecified site   .  Radiation 10/24/13-12/09/13    Left chestwall/supraclav./scar  . GERD (gastroesophageal reflux  disease)   . Back muscle spasm     occasional tx with skelaxin    PAST SURGICAL HISTORY: Past Surgical History  Procedure Laterality Date  . Bilateral total mastectomy with axillary lymph node dissection    . Tonsillectomy    . Leep    . Portacath placement N/A 06/16/2013    Procedure: POWER PORT PLACEMENT;  Surgeon: Shann Medal, MD;  Location: [facility];  Service: General;  Laterality: N/A;  . Axillary lymph node dissection Left 06/16/2013    Procedure: LEFT AXILLARY NODE DISSECTION;  Surgeon: Shann Medal, MD;  Location: [facility];  Service: General;  Laterality: Left;  . Breast surgery    . Wisdom tooth extraction    . Right leg surgery      broken femur - has rod in leg  . Laparoscopic bilateral salpingo oopherectomy Bilateral 01/25/2014    Procedure: LAPAROSCOPIC BILATERAL SALPINGO OOPHORECTOMY;  Surgeon: Eldred Manges, MD;  Location: [facility];  Service: Gynecology;  Laterality: Bilateral;  . Port-a-cath removal N/A 01/25/2014    Procedure: REMOVAL PORT-A-CATH;  Surgeon: Alphonsa Overall, MD;  Location: [facility];  Service: General;  Laterality: N/A;    FAMILY HISTORY Family History  Problem Relation Age of Onset  . Other Mother 10    glioblastoma; deceased 72  . Cancer Mother   . Other Father 24    glioblastoma; deceased 61  . Cancer Father   . Hypertension Other    according to the patient both her parents died from a primary brain cancers, namely we have the stomas, her father at 78, her mother at 79. The patient had one brother and one sister. There is no history of breast or ovarian cancer in the family  GYNECOLOGIC HISTORY:   (Reviewed 10/10/2013) Menarche age 51. The patient has never carried a child to term. She was still having regular periods at the time of her recent diagnosis. LMP 07/18/2013 as of 09/08/2013.  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 She shares a home with her significant other, [name], who works at installing car radios and also in [place]: Not in [place]:  (Reviewed 10/10/2013) Social History  Substance Use Topics  . Smoking status: Never Smoker   . Smokeless tobacco: Never Used  . Alcohol Use: Yes     Comment: socially wine     Colonoscopy: Never  PAP: December 2014  Bone density: Never  Lipid panel: Not on file   Allergies  Allergen Reactions  . Sulfa Antibiotics Hives and Itching  . Gadolinium Derivatives Hives    After gado injection, pt had a few small hives on left breast area. Treated with benadryl by dr Janeece Fitting   . Petrolatum Hives and Rash    Current Outpatient Prescriptions  Medication Sig Dispense Refill  . metaxalone (SKELAXIN) 800 MG tablet Take 1 tablet (800 mg total) by mouth 3 (three) times daily as needed for muscle spasms. 60 tablet 3  . omega-3 acid ethyl esters (LOVAZA) 1 G capsule Take 1 g by mouth daily. Reported on 04/17/2015    . tamoxifen (NOLVADEX) 20 MG tablet Take 1 tablet (20 mg total) by mouth daily.  30 tablet 5  . Venlafaxine HCl 150 MG TB24 Take 1 tablet (150 mg total) by mouth daily. 30 each 11  . aspirin-acetaminophen-caffeine (EXCEDRIN MIGRAINE) 384-536-46 MG per tablet Take by mouth every 6 (six) hours as needed for headache. Reported on 09/06/2015    . gabapentin (NEURONTIN) 300 MG capsule Take 1 capsule (300 mg total) by mouth at bedtime. (Patient not taking: Reported on 08/17/2015) 90 capsule 4  . omeprazole (PRILOSEC) 40 MG capsule Take 1 capsule (40 mg total) by mouth daily. (Patient not taking: Reported on 08/17/2015) 90 capsule 3   No current facility-administered medications for this visit.    OBJECTIVE: Young [place] American woman who appears  tired but is in no acute distress Filed Vitals:   09/06/15 1430   BP: 135/96  Pulse: 89  Temp: 98.6 F (37 C)  Resp: 18     Body mass index is 34.39 kg/(m^2).    ECOG FS:1 - Symptomatic but completely ambulatory Filed Weights   09/06/15 1430  Weight: 219 lb 9.6 oz (99.61 kg)   Skin: warm, dry, keloid to upper mid chest  HEENT: sclerae anicteric, conjunctivae pink, oropharynx clear. No thrush or mucositis.  Lymph Nodes: No cervical or supraclavicular lymphadenopathy  Lungs: clear to auscultation bilaterally, no rales, wheezes, or rhonci  Heart: regular rate and rhythm  Abdomen: round, soft, non tender, positive bowel sounds  Musculoskeletal: No focal spinal tenderness, left upper extremity lymphedema grade 2, wearing sleeve Neuro: non focal, well oriented, positive affect  Breast: bilateral breasts status post mastectomies with implant reconstruction. No evidence of recurrent disease. Bilateral axillae benign.   LAB RESULTS:   Lab Results  Component Value Date   WBC 5.8 09/06/2015   NEUTROABS 2.5 09/06/2015   HGB 11.9 09/06/2015   HCT 37.0 09/06/2015   MCV 85.8 09/06/2015   PLT 225 09/06/2015      Chemistry      Component Value Date/Time   NA 140 09/06/2015 1404   NA 138 11/16/2014 0129   K 3.8 09/06/2015 1404   K 3.9 11/16/2014 0129   CL 103 11/16/2014 0129   CO2 26 09/06/2015 1404   CO2 27 11/16/2014 0129   BUN 14.2 09/06/2015 1404   BUN 11 11/16/2014 0129   CREATININE 0.9 09/06/2015 1404   CREATININE 0.85 11/16/2014 0129      Component Value Date/Time   CALCIUM 10.1 09/06/2015 1404   CALCIUM 9.7 11/16/2014 0129   ALKPHOS 64 09/06/2015 1404   ALKPHOS 63 11/16/2014 0129   AST 13 09/06/2015 1404   AST 18 11/16/2014 0129   ALT 12 09/06/2015 1404   ALT 15 11/16/2014 0129   BILITOT <0.30 09/06/2015 1404   BILITOT 0.4 11/16/2014 0129      STUDIES: No results found.  EXAM: MRI CHEST WITHOUT AND WITH CONTRAST  TECHNIQUE: 20 cc MultiHance  CONTRAST: 68m MULTIHANCE GADOBENATE DIMEGLUMINE 529 MG/ML IV  SOLN  COMPARISON: PET-CT dated 05/27/2013  FINDINGS: The left brachial plexus is well visualized and appears normal. The neurovascular bundle as well seen through the axilla and into the upper arm. There is no mass lesion or adenopathy. No pathologic enhancement after contrast administration. No supraclavicular mass or lymph node. There is a small bulge of the C5-6 disc to the left. Does the patient have a C6 radiculopathy?  There is a small benign cyst in the posterior aspect of the humeral head, unchanged since the prior PET-CT.  Left lung apex is normal.  IMPRESSION: Small disc bulge at  C5-6 to the left which could affect the left C6 nerve.  Otherwise, normal MRI of the left brachial plexus extending through the left axilla.   Electronically Signed  By: Lorriane Shire M.D.  On: 02/27/2015 08:57   ASSESSMENT: 40 y.o. BRCA negative [place] woman  (1) status post bilateral mastectomies with left axillary lymph node sampling [6 nodes removed] 01/02/2010 for a pT2 pN0(i+), stage IIA invasive lobular breast cancer, grade 3, estrogen and progesterone receptor positive, HER-2 not amplified; right breast was benign  (2) Oncotype DX score of 22 predicted a 14% risk of distant recurrence within 10 years if the patient's only systemic treatment is tamoxifen for 5 years  (3) status post CMF(cyclophosphamide, fluorouracil, methotrexate) x7 given between October of 2011 and March of 2012 (7 of 8 planned treatments completed)  (4) tamoxifen started April 2012, discontinued approximately July of 2013 (about 15 months) because of problems with hot flashes and insomnia  (5) pathologically documented left axillary recurrence 05/05/2013, the tumor being again estrogen and progesterone receptor positive, with HER-2 not amplified, and MIB-1 of 15%. Staging CT/PET 05/27/2013 show left axillary and supraclavicular recurrence, no distant disease  (6) status post completion left  axillary lymph node dissection 06/16/2013 showing a total of 10/15 lymph nodes involved by tumor, with evidence of extracapsular extension (TX N3 = stage IIIC)  (7) treated with carboplatin/ docetaxel, first dose on 07/11/2013, repeated every 21 days x 4 with Neulasta support on day 2.  Final dose was given on 09/12/2013, with chemotherapy discontinued after 4 cycles due to continuing low counts.  (8) radiation completed 12/09/2013: Left chest wall / 50.4 Gray @ 1.8 Gray per fraction x 28 fractions Left Supraclavicular fossa and PAB/ 45 Gray @1 .8 Gray per fraction x 25 fractions Left scar / 10 Gray at [employer] per fraction x 5 fractions   (9) bilateral salpingo-oophorectomy on 01/25/14  (10)   lymphedema and limited range of motion in the left upper extremity secondary to left axillary lymph node dissection, being treated through the lymphedema clinic  - improved   (13) sequencing and deletion/duplication analysis of 17 genes performed September 2015 at [facility] did not reveal any deleterious mutation in  ATM, BARD1, BRCA1, BRCA2, BRIP1, CDH1, CHEK2, MRE11A, MUTYH, NBN, NF1, PALB2, PTEN, RAD50, RAD51C, RAD51D, and TP53.  (14) tamoxifen started 02/27/2014  PLAN: [name] is doing well as far as her breast cancer is concerned. She has no evidence of recurrent disease. She is tolerating the tamoxifen well and has decided she would like to remain on this drug for the full 10 years, instead of giving any consideration to aromatase inhibitors in the future. She will continue on velafaxine and gabapentin QHS for her hot flashes.  We talked more about dieting strategies and techniques. Really she just needs to be persistent and consistent with her food choices.   [name] will return in 6 months for follow up. She understands and agrees with this plan. She knows the goal of treatment in her case is cure. She has been encouraged to call with any issues that might arise before her next visit here.    Laurie Panda, NP   09/06/2015 4:40 PM

## 2015-09-06 NOTE — Telephone Encounter (Signed)
appt made and avs printed °

## 2015-09-07 ENCOUNTER — Ambulatory Visit: Payer: BLUE CROSS/BLUE SHIELD | Admitting: Physical Therapy

## 2015-09-10 ENCOUNTER — Encounter: Payer: BLUE CROSS/BLUE SHIELD | Admitting: Physical Therapy

## 2015-09-11 ENCOUNTER — Encounter: Payer: Self-pay | Admitting: Oncology

## 2015-09-11 NOTE — Progress Notes (Signed)
Fax sent 05/21/15 I sent to medical records

## 2015-09-14 ENCOUNTER — Encounter: Payer: BLUE CROSS/BLUE SHIELD | Admitting: Physical Therapy

## 2015-09-25 ENCOUNTER — Other Ambulatory Visit: Payer: Self-pay

## 2015-09-25 ENCOUNTER — Ambulatory Visit: Payer: Self-pay | Admitting: Oncology

## 2015-10-23 ENCOUNTER — Telehealth: Payer: Self-pay | Admitting: *Deleted

## 2015-10-23 NOTE — Telephone Encounter (Signed)
Call received from [facility] with [facility] [phone] requesting patient records indicating diagnosis for coverage of [employer] order.  Fax to [phone] with no cover sheet.  This nurse will send via EPIC.

## 2015-11-08 ENCOUNTER — Telehealth: Payer: Self-pay | Admitting: Oncology

## 2015-11-08 ENCOUNTER — Other Ambulatory Visit: Payer: Self-pay | Admitting: *Deleted

## 2015-11-08 NOTE — Telephone Encounter (Signed)
Faxed requested records to [facility] [phone] release id)

## 2015-11-09 ENCOUNTER — Telehealth: Payer: Self-pay

## 2015-11-09 NOTE — Telephone Encounter (Signed)
Rcvd call from patient re: [place] form.  Form found and faxed with office note.  Original form and fax cover given to managed care.

## 2015-11-27 ENCOUNTER — Telehealth: Payer: Self-pay

## 2015-11-27 NOTE — Telephone Encounter (Signed)
Messages rcvd to fax info to [facility].  Let pt know info had been sent.   Fax sent to scan.

## 2015-12-07 ENCOUNTER — Other Ambulatory Visit: Payer: Self-pay | Admitting: *Deleted

## 2015-12-11 ENCOUNTER — Encounter: Payer: Self-pay | Admitting: Oncology

## 2015-12-11 NOTE — Progress Notes (Signed)
[place] form left 12/07/15-left for dr. Jana Hakim to sign

## 2015-12-12 ENCOUNTER — Telehealth: Payer: Self-pay | Admitting: *Deleted

## 2015-12-12 NOTE — Telephone Encounter (Signed)
Received call from pt concerning a clarification form from [employer] we've received that needs to be fax back today to [phone]. Told pt we'll call her and let her know when faxed.

## 2016-01-04 ENCOUNTER — Other Ambulatory Visit: Payer: Self-pay

## 2016-01-04 DIAGNOSIS — C50812 Malignant neoplasm of overlapping sites of left female breast: Secondary | ICD-10-CM

## 2016-01-04 MED ORDER — TAMOXIFEN CITRATE 20 MG PO TABS: 20.0000 mg | ORAL_TABLET | Freq: Every day | ORAL | 1 refills | Status: DC

## 2016-01-04 NOTE — Telephone Encounter (Signed)
Refill request received from [facility] for tamoxifen. Looks like last refill was sent to [facility] outpatient pharmacy. Asked pt to call back and clarify which pharmacy to send refill to.  Pt called back: she is switching to 90 day supply mail order for lesser price. Order switched to [facility] for tamoxifen

## 2016-01-18 ENCOUNTER — Encounter: Payer: Self-pay | Admitting: *Deleted

## 2016-03-04 ENCOUNTER — Telehealth: Payer: Self-pay | Admitting: Oncology

## 2016-03-04 NOTE — Telephone Encounter (Signed)
11/15 Appointments rescheduled per patient request. The patient would like appointments to be at the begining of January.

## 2016-03-05 ENCOUNTER — Telehealth: Payer: Self-pay | Admitting: *Deleted

## 2016-03-05 NOTE — Telephone Encounter (Signed)
Called received @ 945 from Cori Razor from [employer]. She stated that she faxed over very important forms for pt. on 11/2 to be filled out and faxed back, However, she stated that they haven't received them as of yet. Call was transferred to vmail messege left.

## 2016-03-10 ENCOUNTER — Ambulatory Visit: Payer: BLUE CROSS/BLUE SHIELD | Admitting: Oncology

## 2016-03-10 ENCOUNTER — Other Ambulatory Visit: Payer: BLUE CROSS/BLUE SHIELD

## 2016-03-11 NOTE — Telephone Encounter (Signed)
Above forwarded to managed care

## 2016-03-12 ENCOUNTER — Ambulatory Visit: Payer: BLUE CROSS/BLUE SHIELD | Admitting: Oncology

## 2016-03-12 ENCOUNTER — Other Ambulatory Visit: Payer: BLUE CROSS/BLUE SHIELD

## 2016-04-03 ENCOUNTER — Other Ambulatory Visit: Payer: Self-pay | Admitting: *Deleted

## 2016-04-03 ENCOUNTER — Telehealth: Payer: Self-pay | Admitting: *Deleted

## 2016-04-03 DIAGNOSIS — Z170 Estrogen receptor positive status [ER+]: Secondary | ICD-10-CM

## 2016-04-03 DIAGNOSIS — C50812 Malignant neoplasm of overlapping sites of left female breast: Secondary | ICD-10-CM

## 2016-04-03 MED ORDER — TAMOXIFEN CITRATE 20 MG PO TABS: 20.0000 mg | ORAL_TABLET | Freq: Every day | ORAL | 0 refills | Status: DC

## 2016-04-03 NOTE — Telephone Encounter (Signed)
Pt called requested refill of Tamoxifen.  Pt is almost out of med.  Noted pt did not come for follow up visit in November as scheduled.  Voice mail left for Val, RN desk nurse to ok to refill.  Pt requested refill to be sent in to [facility] on  [address]. Pt's     Phone    [phone].

## 2016-04-25 ENCOUNTER — Other Ambulatory Visit: Payer: Self-pay | Admitting: *Deleted

## 2016-04-25 DIAGNOSIS — C50812 Malignant neoplasm of overlapping sites of left female breast: Secondary | ICD-10-CM

## 2016-04-29 ENCOUNTER — Ambulatory Visit (HOSPITAL_BASED_OUTPATIENT_CLINIC_OR_DEPARTMENT_OTHER): Payer: BLUE CROSS/BLUE SHIELD | Admitting: Oncology

## 2016-04-29 ENCOUNTER — Other Ambulatory Visit (HOSPITAL_BASED_OUTPATIENT_CLINIC_OR_DEPARTMENT_OTHER): Payer: BLUE CROSS/BLUE SHIELD

## 2016-04-29 ENCOUNTER — Encounter: Payer: Self-pay | Admitting: Oncology

## 2016-04-29 ENCOUNTER — Ambulatory Visit (HOSPITAL_COMMUNITY)
Admission: RE | Admit: 2016-04-29 | Discharge: 2016-04-29 | Disposition: A | Discharge status: Home / Self Care | Payer: BLUE CROSS/BLUE SHIELD | Source: Ambulatory Visit | Attending: Oncology | Admitting: Oncology

## 2016-04-29 VITALS — BP 120/86 | HR 73 | Temp 98.3°F | Resp 18 | Ht 67.0 in | Wt 224.5 lb

## 2016-04-29 DIAGNOSIS — C50812 Malignant neoplasm of overlapping sites of left female breast: Secondary | ICD-10-CM

## 2016-04-29 DIAGNOSIS — C773 Secondary and unspecified malignant neoplasm of axilla and upper limb lymph nodes: Secondary | ICD-10-CM | POA: Diagnosis not present

## 2016-04-29 DIAGNOSIS — C50912 Malignant neoplasm of unspecified site of left female breast: Secondary | ICD-10-CM

## 2016-04-29 DIAGNOSIS — I890 Lymphedema, not elsewhere classified: Secondary | ICD-10-CM

## 2016-04-29 DIAGNOSIS — M545 Low back pain: Secondary | ICD-10-CM

## 2016-04-29 DIAGNOSIS — C778 Secondary and unspecified malignant neoplasm of lymph nodes of multiple regions: Secondary | ICD-10-CM | POA: Diagnosis not present

## 2016-04-29 DIAGNOSIS — Z170 Estrogen receptor positive status [ER+]: Secondary | ICD-10-CM

## 2016-04-29 DIAGNOSIS — N951 Menopausal and female climacteric states: Secondary | ICD-10-CM

## 2016-04-29 LAB — COMPREHENSIVE METABOLIC PANEL
ALT: 14 U/L (ref 0–55)
AST: 17 U/L (ref 5–34)
Albumin: 3.7 g/dL (ref 3.5–5.0)
Alkaline Phosphatase: 59 U/L (ref 40–150)
Anion Gap: 8 mEq/L (ref 3–11)
BILIRUBIN TOTAL: 0.36 mg/dL (ref 0.20–1.20)
BUN: 11.6 mg/dL (ref 7.0–26.0)
CO2: 25 mEq/L (ref 22–29)
CREATININE: 0.8 mg/dL (ref 0.6–1.1)
Calcium: 9.8 mg/dL (ref 8.4–10.4)
Chloride: 108 mEq/L (ref 98–109)
EGFR: >90 mL/min/{1.73_m2} (ref >90)
Glucose: 102 mg/dl (ref 70–140)
Potassium: 4.1 mEq/L (ref 3.5–5.1)
Sodium: 141 mEq/L (ref 136–145)
TOTAL PROTEIN: 7.5 g/dL (ref 6.4–8.3)

## 2016-04-29 LAB — CBC WITH DIFFERENTIAL/PLATELET
BASO%: 0.4 % (ref 0.0–2.0)
Basophils Absolute: 0 10*3/uL (ref 0.0–0.1)
EOS%: 2.9 % (ref 0.0–7.0)
Eosinophils Absolute: 0.1 10*3/uL (ref 0.0–0.5)
HEMATOCRIT: 36.9 % (ref 34.8–46.6)
HEMOGLOBIN: 11.8 g/dL (ref 11.6–15.9)
LYMPH#: 2.3 10*3/uL (ref 0.9–3.3)
LYMPH%: 46.7 % (ref 14.0–49.7)
MCH: 26.9 pg (ref 25.1–34.0)
MCHC: 32 g/dL (ref 31.5–36.0)
MCV: 84.1 fL (ref 79.5–101.0)
MONO#: 0.3 10*3/uL (ref 0.1–0.9)
MONO%: 5.8 % (ref 0.0–14.0)
NEUT%: 44.2 % (ref 38.4–76.8)
NEUTROS ABS: 2.1 10*3/uL (ref 1.5–6.5)
PLATELETS: 216 10*3/uL (ref 145–400)
RBC: 4.39 10*6/uL (ref 3.70–5.45)
RDW: 14.4 % (ref 11.2–14.5)
WBC: 4.8 10*3/uL (ref 3.9–10.3)

## 2016-04-29 MED ORDER — TAMOXIFEN CITRATE 20 MG PO TABS: 20.0000 mg | ORAL_TABLET | Freq: Every day | ORAL | 0 refills | Status: DC

## 2016-04-29 NOTE — Progress Notes (Signed)
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 A biopsy of the mass in question [facility] showed ([id number]) and invasive lobular carcinoma, which was estrogen and progesterone receptor both 100% positive, both with strong staining intensity, but HER-2 negative at 1+. Bilateral breast MRI 12/13/2009 in May showed in the left breast an irregularly marginated mass measuring 3.2 cm. There were 4 or 5 separate satellite lesions laterally and superior to the primary.  Accordingly, after appropriate discussion, the patient underwent bilateral mastectomies with left sentinel lymph node sampling 01/02/2010. The right breast was benign. The left breast showed a 2.3 cm invasive lobular carcinoma, grade 3, with a total of 6 sentinel lymph nodes removed, all negative, although 2 showed isolated tumor cells by immunostaining. Margins were negative.  An Oncotype BX was sent, with a recurrence score of 22, predicting a risk of distant recurrence within 10 years with 14% of the patients only systemic treatment was tamoxifen. The patient was treated with CMF chemotherapy between 02/22/2010 and 07/05/2010, receiving 7 cycles at which point the patient refused further chemotherapy though there have been no major toxicities at according to the oncology note from Dr. Brent General. The patient was then started on tamoxifen 08/09/2010. By  her cancer took approximately 12-15 months then stopped because of hot flashes and insomnia problems.  More recently, the patient noted a change in her left axilla andunderwent bilateral breast MRI January to 2015 at [place]. The patient has bilateral submuscular silicone implants in place. The breast were unremarkable, but there were several lymph nodes with cortical thickening in the left axilla including a dominant 1 measuring 1.2 cm in the short axis. Ultrasonography of this area identified the lymph node in question and the patient underwent biopsy of the left axillary lymph node This showed ([id number]) near-total replacement of the lymph node by metastatic carcinoma. This was 96% estrogen receptor positive, and 84% progesterone receptor positive, both with strong staining intensity. The MIB-1 was 20%. HER-2 determination is pending.  The patient's subsequent history is as detailed below  INTERVAL HISTORY: Ellaree returns today for follow up of her estrogen receptor positive breast cancer. She continues on tamoxifen, with good tolerance. She does have hot flashes, but "I deal with it". She has stopped her venlafaxine and Neurontin. She doesn't have a vaginal discharge problem. She obtains a drug at a good price.  REVIEW OF SYSTEMS: [name] is still having back pain. This is not a new problem but it is eating in the way of her new job. 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 She can't get up and walk except for 2:15 minute breaks and at lunch time. When she sits for prolonged periods of back pain gets worse. It's also worsen the morning. It gets better with activity. She's also worried about weight issues. She is on a good diet she tells me, although it is not clear how many vegetables a day she eats. She is  constipated. Aside from these issues a detailed review of systems today was stable.   PAST MEDICAL HISTORY: Past Medical History:  Diagnosis Date  . Back muscle spasm     occasional tx with skelaxin  . Cancer ([place])    breast ca  . GERD (gastroesophageal reflux disease)   . Malignant neoplasm of breast (female), unspecified site [facility])   . Radiation 10/24/13-12/09/13   Left chestwall/supraclav./scar    PAST SURGICAL HISTORY: Past Surgical History:  Procedure Laterality Date  . AXILLARY LYMPH NODE DISSECTION Left 06/16/2013   Procedure: LEFT AXILLARY NODE DISSECTION;  Surgeon: Shann Medal, MD;  Location: [facility];  Service: General;  Laterality: Left;  . BILATERAL TOTAL MASTECTOMY WITH AXILLARY LYMPH NODE DISSECTION    . BREAST SURGERY    . LAPAROSCOPIC BILATERAL SALPINGO OOPHERECTOMY Bilateral 01/25/2014   Procedure: LAPAROSCOPIC BILATERAL SALPINGO OOPHORECTOMY;  Surgeon: Eldred Manges, MD;  Location: [facility];  Service: Gynecology;  Laterality: Bilateral;  . LEEP    . PORT-A-CATH REMOVAL N/A 01/25/2014   Procedure: REMOVAL PORT-A-CATH;  Surgeon: Alphonsa Overall, MD;  Location: [facility];  Service: General;  Laterality: N/A;  . PORTACATH PLACEMENT N/A 06/16/2013   Procedure: POWER PORT PLACEMENT;  Surgeon: Shann Medal, MD;  Location: [facility];  Service: General;  Laterality: N/A;  . right leg surgery     broken femur - has rod in leg  . TONSILLECTOMY    . WISDOM TOOTH EXTRACTION      FAMILY HISTORY Family History  Problem Relation Age of Onset  . Other Mother 75    glioblastoma; deceased 7  . Cancer Mother   . Other Father 18    glioblastoma; deceased 24  . Cancer Father   . Hypertension Other    according to the patient both her parents died from a primary brain cancers, namely we have the stomas, her father at 34, her mother at 71. The patient had one brother and one sister. There is no history of breast or ovarian cancer in the family  GYNECOLOGIC HISTORY:   (Reviewed 10/10/2013) Menarche age 28. The patient has never carried a child to term. She was still having regular periods at the time of her recent diagnosis. LMP 07/18/2013 as of 09/08/2013.   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 She shares a home with her significant other, Burnice Logan, who works at installing car radios and also in [place]: Not in [place]:  (Reviewed 10/10/2013) Social History  Substance Use Topics  . Smoking status: Never Smoker  . Smokeless tobacco: Never Used  . Alcohol use Yes     Comment: socially wine     Colonoscopy: Never  PAP: December 2014  Bone density: Never  Lipid panel: Not on file   Allergies  Allergen Reactions  . Sulfa Antibiotics Hives and Itching  . Gadolinium Derivatives Hives    After gado injection, pt had a few small hives on left breast area. Treated with benadryl by dr Janeece Fitting   . Petrolatum Hives and Rash    Current Outpatient Prescriptions  Medication Sig Dispense Refill  . aspirin-acetaminophen-caffeine (EXCEDRIN MIGRAINE) 250-250-65 MG per tablet Take by mouth every 6 (six) hours as needed for headache. Reported on 09/06/2015    .  metaxalone (SKELAXIN) 800 MG tablet Take 1 tablet (800 mg total) by mouth 3 (three) times daily as needed for muscle spasms. 60 tablet 3  . omeprazole (PRILOSEC) 40 MG capsule Take 1 capsule (40 mg total) by mouth daily. (Patient not taking: Reported on 08/17/2015) 90 capsule 3  . tamoxifen (NOLVADEX) 20 MG tablet Take 1 tablet (20 mg total) by mouth daily. 90 tablet 0   No current facility-administered medications for this visit.     OBJECTIVE: Young African American woman who appears well  Vitals:   04/29/16 1013  BP: 120/86  Pulse: 73  Resp: 18  Temp: 98.3 F (36.8 C)     Body mass index is 35.16 kg/m.    ECOG FS:1 - Symptomatic but completely ambulatory Filed Weights   04/29/16 1013  Weight: 224 lb 8 oz (101.8  kg)   Sclerae unicteric, pupils round and equal Oropharynx clear and moist-- no thrush or other lesions No cervical or supraclavicular adenopathy Lungs no rales or rhonchi Heart regular rate and rhythm Abd soft, nontender, positive bowel sounds MSK no focal spinal tenderness, no upper extremity lymphedema Neuro: nonfocal, well oriented, appropriate affect Breasts:Status post bilateral mastectomies with bilateral implant reconstruction. There is no evidence of local recurrence. Both axillae are benign.    LAB RESULTS:   Lab Results  Component Value Date   WBC 4.8 04/29/2016   NEUTROABS 2.1 04/29/2016   HGB 11.8 04/29/2016   HCT 36.9 04/29/2016   MCV 84.1 04/29/2016   PLT 216 04/29/2016      Chemistry      Component Value Date/Time   NA 141 04/29/2016 0957   K 4.1 04/29/2016 0957   CL 103 11/16/2014 0129   CO2 25 04/29/2016 0957   BUN 11.6 04/29/2016 0957   CREATININE 0.8 04/29/2016 0957      Component Value Date/Time   CALCIUM 9.8 04/29/2016 0957   ALKPHOS 59 04/29/2016 0957   AST 17 04/29/2016 0957   ALT 14 04/29/2016 0957   BILITOT 0.36 04/29/2016 0957      STUDIES: No results found.  ASSESSMENT: 41 y.o. BRCA negative [place] woman  (1) status post bilateral mastectomies with left axillary lymph node sampling [6 nodes removed] 01/02/2010 for a pT2 pN0(i+), stage IIA invasive lobular breast cancer, grade 3, estrogen and progesterone receptor positive, HER-2 not amplified; right breast was benign  (2) Oncotype DX score of 22 predicted a 14% risk of distant recurrence within 10 years if the patient's only systemic treatment is tamoxifen for 5 years  (3) status post CMF(cyclophosphamide, fluorouracil, methotrexate) x7 given between October of 2011 and March of 2012 (7 of 8 planned treatments completed)  (4) tamoxifen started April 2012, discontinued approximately July of 2013 (about 15 months) because of problems with hot flashes and insomnia  (5)  pathologically documented left axillary recurrence 05/05/2013, the tumor being again estrogen and progesterone receptor positive, with HER-2 not amplified, and MIB-1 of 15%. Staging CT/PET 05/27/2013 show left axillary and supraclavicular recurrence, no distant disease  (6) status post completion left axillary lymph node dissection 06/16/2013 showing a total of 10/15 lymph nodes involved by tumor, with evidence of extracapsular extension (TX N3 = stage IIIC)  (7) treated with carboplatin/ docetaxel, first dose on 07/11/2013, repeated every 21 days x 4 with Neulasta support on day 2.  Final dose was given on 09/12/2013, with chemotherapy discontinued after 4 cycles due to continuing low counts.  (8) radiation completed 12/09/2013: Left chest wall / 50.4 Gray @  1.8 Gray per fraction x 28 fractions Left Supraclavicular fossa and PAB/ 45 Gray _0 .8 Gray per fraction x 25 fractions Left scar / 10 Gray at [employer] per fraction x 5 fractions   (9) bilateral salpingo-oophorectomy on 01/25/14  (10)   lymphedema and limited range of motion in the left upper extremity secondary to left axillary lymph node dissection, being treated through the lymphedema clinic  - improved   (13) sequencing and deletion/duplication analysis of 17 genes performed September 2015 at [facility] did not reveal any deleterious mutation in  ATM, BARD1, BRCA1, BRCA2, BRIP1, CDH1, CHEK2, MRE11A, MUTYH, NBN, NF1, PALB2, PTEN, RAD50, RAD51C, RAD51D, and TP53.  (14) tamoxifen started 02/27/2014  PLAN: Maliya is now 3 years out from her local recurrence of breast cancer, with no evidence of active disease. This is very favorable.  She has had her implants since 2011 and though I don't feel any abnormality she is very worried there may be a hidden problem and is interested in seeing a Psychiatric nurse. Abdomen ahead and facilitated that visit for her.  I think the low back pain that she is experiencing is likely to be  musculoskeletal/benign, but we are going to obtain plain films of her lumbar spine today just to make sure. I showed her how to get in to my chart to get those results but of course if there is any suspicious finding we will move to an MRI  I am going to see her again in 6 months. Prior to that visit she will have a CT scan of the chest. She remains at high risk of recurrence, but is tolerating the tamoxifen well and the plan is to continue that drug for a minimum of 9 years (she had taken it for 15 months before with her first experience of breast cancer).  She knows to call for any problems that may develop before that visit.  Chauncey Cruel, MD   04/29/2016 10:42 AM

## 2016-05-06 ENCOUNTER — Other Ambulatory Visit: Payer: Self-pay | Admitting: *Deleted

## 2016-07-03 ENCOUNTER — Telehealth: Payer: Self-pay

## 2016-07-03 DIAGNOSIS — Z170 Estrogen receptor positive status [ER+]: Secondary | ICD-10-CM

## 2016-07-03 DIAGNOSIS — C50812 Malignant neoplasm of overlapping sites of left female breast: Secondary | ICD-10-CM

## 2016-07-03 MED ORDER — TAMOXIFEN CITRATE 20 MG PO TABS: 20.0000 mg | ORAL_TABLET | Freq: Every day | ORAL | 1 refills | Status: DC

## 2016-07-03 NOTE — Telephone Encounter (Signed)
Pt called for tamoxifen refill. Done per protocol

## 2016-10-30 ENCOUNTER — Other Ambulatory Visit (HOSPITAL_BASED_OUTPATIENT_CLINIC_OR_DEPARTMENT_OTHER): Payer: Medicare Other

## 2016-10-30 ENCOUNTER — Other Ambulatory Visit: Payer: BLUE CROSS/BLUE SHIELD

## 2016-10-30 DIAGNOSIS — C50912 Malignant neoplasm of unspecified site of left female breast: Secondary | ICD-10-CM | POA: Diagnosis present

## 2016-10-30 DIAGNOSIS — C778 Secondary and unspecified malignant neoplasm of lymph nodes of multiple regions: Secondary | ICD-10-CM

## 2016-10-30 DIAGNOSIS — C773 Secondary and unspecified malignant neoplasm of axilla and upper limb lymph nodes: Principal | ICD-10-CM

## 2016-10-30 LAB — CBC WITH DIFFERENTIAL/PLATELET
BASO%: 0.6 % (ref 0.0–2.0)
Basophils Absolute: 0 10*3/uL (ref 0.0–0.1)
EOS ABS: 0.2 10*3/uL (ref 0.0–0.5)
EOS%: 3.9 % (ref 0.0–7.0)
HCT: 38.9 % (ref 34.8–46.6)
HGB: 12.3 g/dL (ref 11.6–15.9)
LYMPH%: 53.7 % — AB (ref 14.0–49.7)
MCH: 27.6 pg (ref 25.1–34.0)
MCHC: 31.6 g/dL (ref 31.5–36.0)
MCV: 87.2 fL (ref 79.5–101.0)
MONO#: 0.3 10*3/uL (ref 0.1–0.9)
MONO%: 5.6 % (ref 0.0–14.0)
NEUT#: 1.8 10*3/uL (ref 1.5–6.5)
NEUT%: 36.2 % — ABNORMAL LOW (ref 38.4–76.8)
PLATELETS: 217 10*3/uL (ref 145–400)
RBC: 4.46 10*6/uL (ref 3.70–5.45)
RDW: 14.2 % (ref 11.2–14.5)
WBC: 4.9 10*3/uL (ref 3.9–10.3)
lymph#: 2.6 10*3/uL (ref 0.9–3.3)

## 2016-10-30 LAB — COMPREHENSIVE METABOLIC PANEL
ALT: 11 U/L (ref 0–55)
ANION GAP: 8 meq/L (ref 3–11)
AST: 17 U/L (ref 5–34)
Albumin: 3.7 g/dL (ref 3.5–5.0)
Alkaline Phosphatase: 59 U/L (ref 40–150)
BILIRUBIN TOTAL: 0.26 mg/dL (ref 0.20–1.20)
BUN: 15.2 mg/dL (ref 7.0–26.0)
CHLORIDE: 108 meq/L (ref 98–109)
CO2: 26 meq/L (ref 22–29)
Calcium: 10 mg/dL (ref 8.4–10.4)
Creatinine: 0.8 mg/dL (ref 0.6–1.1)
Glucose: 105 mg/dl (ref 70–140)
Potassium: 4 mEq/L (ref 3.5–5.1)
Sodium: 141 mEq/L (ref 136–145)
TOTAL PROTEIN: 7.6 g/dL (ref 6.4–8.3)

## 2016-11-03 ENCOUNTER — Ambulatory Visit (HOSPITAL_BASED_OUTPATIENT_CLINIC_OR_DEPARTMENT_OTHER): Payer: Medicare Other | Admitting: Oncology

## 2016-11-03 VITALS — BP 126/81 | HR 81 | Temp 98.3°F | Resp 18 | Ht 67.0 in | Wt 221.3 lb

## 2016-11-03 DIAGNOSIS — I890 Lymphedema, not elsewhere classified: Secondary | ICD-10-CM

## 2016-11-03 DIAGNOSIS — R252 Cramp and spasm: Secondary | ICD-10-CM

## 2016-11-03 DIAGNOSIS — C50912 Malignant neoplasm of unspecified site of left female breast: Secondary | ICD-10-CM

## 2016-11-03 DIAGNOSIS — C773 Secondary and unspecified malignant neoplasm of axilla and upper limb lymph nodes: Principal | ICD-10-CM

## 2016-11-03 DIAGNOSIS — R200 Anesthesia of skin: Secondary | ICD-10-CM | POA: Diagnosis not present

## 2016-11-03 DIAGNOSIS — C50812 Malignant neoplasm of overlapping sites of left female breast: Secondary | ICD-10-CM

## 2016-11-03 DIAGNOSIS — C50919 Malignant neoplasm of unspecified site of unspecified female breast: Secondary | ICD-10-CM

## 2016-11-03 DIAGNOSIS — Z170 Estrogen receptor positive status [ER+]: Secondary | ICD-10-CM | POA: Insufficient documentation

## 2016-11-03 DIAGNOSIS — N951 Menopausal and female climacteric states: Secondary | ICD-10-CM | POA: Diagnosis not present

## 2016-11-03 DIAGNOSIS — C778 Secondary and unspecified malignant neoplasm of lymph nodes of multiple regions: Secondary | ICD-10-CM | POA: Diagnosis not present

## 2016-11-03 MED ORDER — METAXALONE 800 MG PO TABS: 800.0000 mg | ORAL_TABLET | Freq: Three times a day (TID) | ORAL | 3 refills | Status: DC | PRN

## 2016-11-03 MED ORDER — TAMOXIFEN CITRATE 20 MG PO TABS: 20.0000 mg | ORAL_TABLET | Freq: Every day | ORAL | 1 refills | Status: DC

## 2016-11-03 NOTE — Progress Notes (Signed)
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 A biopsy of the mass in question [facility] showed ([id number]) and invasive lobular carcinoma, which was estrogen and progesterone receptor both 100% positive, both with strong staining intensity, but HER-2 negative at 1+. Bilateral breast MRI 12/13/2009 in [place] showed in the left breast an irregularly marginated mass measuring 3.2 cm. There were 4 or 5 separate satellite lesions laterally and superior to the primary.  Accordingly, after appropriate discussion, the patient underwent bilateral mastectomies with left sentinel lymph node sampling 01/02/2010. The right breast was benign. The left breast showed a 2.3 cm invasive lobular carcinoma, grade 3, with a total of 6 sentinel lymph nodes removed, all negative, although 2 showed isolated tumor cells by immunostaining. Margins were negative.  An Oncotype BX was sent, with a recurrence score of 22, predicting a risk of distant recurrence within 10 years with 14% of the patients only systemic treatment was tamoxifen. The patient was treated with CMF chemotherapy between 02/22/2010 and 07/05/2010, receiving 7 cycles at which point the patient refused further chemotherapy though there have been no major toxicities at according to the oncology note from Dr. Brent General. The patient was then started on tamoxifen 08/09/2010. By her  cancer took approximately 12-15 months then stopped because of hot flashes and insomnia problems.  More recently, the patient noted a change in her left axilla andunderwent bilateral breast MRI January to 2015 at [place]. The patient has bilateral submuscular silicone implants in place. The breast were unremarkable, but there were several lymph nodes with cortical thickening in the left axilla including a dominant 1 measuring 1.2 cm in the short axis. Ultrasonography of this area identified the lymph node in question and the patient underwent biopsy of the left axillary lymph node This showed ([id number]) near-total replacement of the lymph node by metastatic carcinoma. This was 96% estrogen receptor positive, and 84% progesterone receptor positive, both with strong staining intensity. The MIB-1 was 20%. HER-2 determination is pending.  The patient's subsequent history is as detailed below  INTERVAL HISTORY: Dejanira returns today for follow-up of her estrogen receptor positive breast cancer. She continues on tamoxifen, generally with good tolerance. She does have hot flashes. She does not have problems with insomnia. She obtains a drug at a good price.  Recall that Rakhi stopped having periods when she had her chemotherapy.  REVIEW OF SYSTEMS: Natalea is concerned about weight gain. Actually her weight is very stable as compared to a year ago no matter which site she sleeps on when she wakes up she has numbness in both arms and both hands. This gets better during the day. For exercise she uses a treadmill at least 3 days a week about 20-25 minutes at a time. She is a lot of vegetables, some fruit, and lean meats. She is staying away from carbohydrates. She continues to have mild left upper extremity lymphedema. Aside from these issues a detailed review of systems today was  stable  PAST MEDICAL HISTORY: Past Medical History:  Diagnosis Date  . Back muscle spasm    occasional tx with  skelaxin  . Cancer ([place])    breast ca  . GERD (gastroesophageal reflux disease)   . Malignant neoplasm of breast (female), unspecified site [facility])   . Radiation 10/24/13-12/09/13   Left chestwall/supraclav./scar    PAST SURGICAL HISTORY: Past Surgical History:  Procedure Laterality Date  . AXILLARY LYMPH NODE DISSECTION Left 06/16/2013   Procedure: LEFT AXILLARY NODE DISSECTION;  Surgeon: Shann Medal, MD;  Location: [facility];  Service: General;  Laterality: Left;  . BILATERAL TOTAL MASTECTOMY WITH AXILLARY LYMPH NODE DISSECTION    . BREAST SURGERY    . LAPAROSCOPIC BILATERAL SALPINGO OOPHERECTOMY Bilateral 01/25/2014   Procedure: LAPAROSCOPIC BILATERAL SALPINGO OOPHORECTOMY;  Surgeon: Eldred Manges, MD;  Location: [facility];  Service: Gynecology;  Laterality: Bilateral;  . LEEP    . PORT-A-CATH REMOVAL N/A 01/25/2014   Procedure: REMOVAL PORT-A-CATH;  Surgeon: Alphonsa Overall, MD;  Location: [facility];  Service: General;  Laterality: N/A;  . PORTACATH PLACEMENT N/A 06/16/2013   Procedure: POWER PORT PLACEMENT;  Surgeon: Shann Medal, MD;  Location: [facility];  Service: General;  Laterality: N/A;  . right leg surgery     broken femur - has rod in leg  . TONSILLECTOMY    . WISDOM TOOTH EXTRACTION      FAMILY HISTORY Family History  Problem Relation Age of Onset  . Other Mother 59       glioblastoma; deceased 36  . Cancer Mother   . Other Father 43       glioblastoma; deceased 33  . Cancer Father   . Hypertension Other    according to the patient both her parents died from a primary brain cancers, namely we have the stomas, her father at 66, her mother at 98. The patient had one brother and one sister. There is no history of breast or ovarian cancer in the family  GYNECOLOGIC HISTORY:   (Reviewed 10/10/2013) Menarche age 58. The patient has never carried a child to term. She was still having regular periods at the time of her recent diagnosis. LMP 07/18/2013 as of 09/08/2013.  The patient  took oral contraceptives between the ages of 25 and 60 with no complications. She stopped having periods with her chemotherapy in 2015.   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 She currently works as a Research scientist (physical sciences). She shares a home with her significant other, Burnice Logan, who works at [employer] and also in [place]: Not in [place]:  (Reviewed 10/10/2013) Social History  Substance Use Topics  . Smoking status: Never Smoker  . Smokeless tobacco: Never Used  . Alcohol use Yes     Comment: socially wine     Colonoscopy: Never  PAP: December 2014  Bone density: Never  Lipid panel: Not on file   Allergies  Allergen Reactions  . Sulfa Antibiotics Hives and Itching  . Gadolinium Derivatives Hives    After gado injection, pt had a few small hives on left breast area. Treated with benadryl by dr Janeece Fitting   . Petrolatum Hives and Rash    Current Outpatient Prescriptions  Medication Sig Dispense Refill  . aspirin-acetaminophen-caffeine (EXCEDRIN MIGRAINE) 250-250-65 MG per tablet Take by mouth every 6 (  six) hours as needed for headache. Reported on 09/06/2015    . metaxalone (SKELAXIN) 800 MG tablet Take 1 tablet (800 mg total) by mouth 3 (three) times daily as needed for muscle spasms. 60 tablet 3  . tamoxifen (NOLVADEX) 20 MG tablet Take 1 tablet (20 mg total) by mouth daily. 90 tablet 1   No current facility-administered medications for this visit.     OBJECTIVE: Young African American woman In no acute distress  Vitals:   11/03/16 0905  BP: 126/81  Pulse: 81  Resp: 18  Temp: 98.3 F (36.8 C)     Body mass index is 34.66 kg/m.    ECOG FS:0 - Asymptomatic Filed Weights   11/03/16 0905  Weight: 221 lb 4.8 oz (100.4 kg)   Sclerae unicteric, EOMs intact Oropharynx clear and moist No  cervical or supraclavicular adenopathy Lungs no rales or rhonchi Heart regular rate and rhythm Abd soft, nontender, positive bowel sounds MSK no focal spinal tenderness, no upper extremity lymphedema Neuro: nonfocal, well oriented, appropriate affect Breasts: She is status post bilateral mastectomies with bilateral implant reconstruction, and status post left sided radiation. There is no evidence of local recurrence. Both axillae are benign.   LAB RESULTS:   Lab Results  Component Value Date   WBC 4.9 10/30/2016   NEUTROABS 1.8 10/30/2016   HGB 12.3 10/30/2016   HCT 38.9 10/30/2016   MCV 87.2 10/30/2016   PLT 217 10/30/2016      Chemistry      Component Value Date/Time   NA 141 10/30/2016 0813   K 4.0 10/30/2016 0813   CL 103 11/16/2014 0129   CO2 26 10/30/2016 0813   BUN 15.2 10/30/2016 0813   CREATININE 0.8 10/30/2016 0813      Component Value Date/Time   CALCIUM 10.0 10/30/2016 0813   ALKPHOS 59 10/30/2016 0813   AST 17 10/30/2016 0813   ALT 11 10/30/2016 0813   BILITOT 0.26 10/30/2016 0813      STUDIES: Plain films of the lumbar spine 04/29/2016 showed no chronic abnormality and no evidence of cancer.  ASSESSMENT: 41 y.o. BRCA negative [place] woman  (1) status post bilateral mastectomies with left axillary lymph node sampling [6 nodes removed] 01/02/2010 for a pT2 pN0(i+), stage IIA invasive lobular breast cancer, grade 3, estrogen and progesterone receptor positive, HER-2 not amplified; right breast was benign  (2) Oncotype DX score of 22 predicted a 14% risk of distant recurrence within 10 years if the patient's only systemic treatment is tamoxifen for 5 years  (3) status post CMF(cyclophosphamide, fluorouracil, methotrexate) x7 given between October of 2011 and March of 2012 (7 of 8 planned treatments completed)  (4) tamoxifen started April 2012, discontinued approximately July of 2013 (about 15 months) because of problems with hot flashes and  insomnia  (5) pathologically documented left axillary recurrence 05/05/2013, the tumor being again estrogen and progesterone receptor positive, with HER-2 not amplified, and MIB-1 of 15%. Staging CT/PET 05/27/2013 show left axillary and supraclavicular recurrence, no distant disease  (6) status post completion left axillary lymph node dissection 06/16/2013 showing a total of 10/15 lymph nodes involved by tumor, with evidence of extracapsular extension (TX N3 = stage IIIC)  (7) treated with carboplatin/ docetaxel, first dose on 07/11/2013, repeated every 21 days x 4 with Neulasta support on day 2.  Final dose was given on 09/12/2013, with chemotherapy discontinued after 4 cycles due to continuing low counts.  (8) radiation completed 12/09/2013: Left chest wall / 50.4 Gray @  1.8 Gray per fraction x 28 fractions Left Supraclavicular fossa and PAB/ 45 Gray _0 .8 Gray per fraction x 25 fractions Left scar / 10 Gray at [employer] per fraction x 5 fractions   (9) bilateral salpingo-oophorectomy on 01/25/14  (10)   lymphedema and limited range of motion in the left upper extremity secondary to left axillary lymph node dissection, being treated through the lymphedema clinic  - improved   (13) sequencing and deletion/duplication analysis of 17 genes performed September 2015 at [facility] did not reveal any deleterious mutation in  ATM, BARD1, BRCA1, BRCA2, BRIP1, CDH1, CHEK2, MRE11A, MUTYH, NBN, NF1, PALB2, PTEN, RAD50, RAD51C, RAD51D, and TP53.  (14) tamoxifen re-started 02/27/2014  PLAN: Carisha is now 3 years out from definitive surgery for her left axillary recurrence and completion of chemotherapy and radiation. There is no evidence of disease activity. This is very favorable.  She continues on tamoxifen, with good tolerance. The plan is to continue that for a total of 10 years.  I don't have a clear understanding of why she would have the numbness in the arms and not just the hands when she wakes  up in the morning regardless of which way she sleeps. I'm, get plain films of the neck today just to make sure we're not dealing with something there although she really has no symptoms in the neck itself.  She does need some compression sleeves and I wrote her a prescription for that today.  We discussed weight issues and the fact that menopause causes an average of about 18 pound weight gain. She has a good diet and she is exercising regularly. She looks fit. If she wanted to lose weight she would have to improve both. I suggested that she weigh herself at home the first day of each month and write it down and see how she is doing.  She will return to see me in one year. She knows to call for any problems that may develop before the next visit here.  Chauncey Cruel, MD   11/03/2016 9:22 AM

## 2016-12-17 ENCOUNTER — Encounter: Payer: Self-pay | Admitting: *Deleted

## 2017-03-06 ENCOUNTER — Encounter: Payer: Self-pay | Admitting: Oncology

## 2017-03-10 ENCOUNTER — Telehealth: Payer: Self-pay | Admitting: *Deleted

## 2017-03-10 NOTE — Telephone Encounter (Signed)
"  [name] calling about hair loss.  Hair on my head is thin, I lost my eyebrows and eyelashes when I received chemotherapy in 2015.  It never came back.  I can't afford buying more wigs, eyelashes and painting on eyebrows.  Does Dr. Jana Hakim have any suggestions or know where I can go to get help?  Return number [phone].  Could you give me the radiation providers name, I called her [name]."   Received Taxotere, Carboplatin in 2015.  Routing call information to collaborative nurse and provider for review.  Further patient communication through collaborative nurse.    This nurse provided RT name.  Denies need for transfer.  Encouraged increasing water, eating heathy, massaging scalp at ten-minute intervals to promote blood supply, Nioxin products reported good for hair growth by other female patients.  Call received from [place].  Confirmed demographics and name change.

## 2017-03-11 ENCOUNTER — Telehealth: Payer: Self-pay | Admitting: *Deleted

## 2017-03-11 NOTE — Telephone Encounter (Signed)
Per review with MD recommended referral to dermatologist at [facility] who specializes in alopecia.  This RN spoke with pt who verified she would like to proceed with above referral.  Per contact with above office - Dr Kemper Durie is booking appt out 1 year- pt may be put on wait list. Her partner Dr Buddy Duty has an opening 11/29 at 2 pm.  Referral made and appointment scheduled.  Attempted to contact pt - obtained VM. General message left. Pt had sent a prior email per above- this RN replied to it with all information.

## 2017-03-26 NOTE — Telephone Encounter (Signed)
No entry 

## 2017-04-15 ENCOUNTER — Telehealth: Payer: Self-pay | Admitting: *Deleted

## 2017-04-15 NOTE — Telephone Encounter (Signed)
This RN received VM from pt stating she had her pap this am with her GYN " but she didn't do a breast exam and said I needed to come to you guys to have that done "  " I need to know what to do next "  Return call number given as [phone].  This RN attempted to contact pt and obtained identified VM- message left to return call to this RN for further discussion.

## 2017-06-12 NOTE — Progress Notes (Signed)
This encounter was created in error - please disregard.

## 2017-07-21 ENCOUNTER — Other Ambulatory Visit: Payer: Self-pay

## 2017-07-21 ENCOUNTER — Telehealth: Payer: Self-pay

## 2017-07-21 DIAGNOSIS — Z170 Estrogen receptor positive status [ER+]: Secondary | ICD-10-CM

## 2017-07-21 DIAGNOSIS — C50812 Malignant neoplasm of overlapping sites of left female breast: Secondary | ICD-10-CM

## 2017-07-21 MED ORDER — TAMOXIFEN CITRATE 20 MG PO TABS: 20.0000 mg | ORAL_TABLET | Freq: Every day | ORAL | 1 refills | Status: DC

## 2017-07-21 MED ORDER — TAMOXIFEN CITRATE 20 MG PO TABS: 20.0000 mg | ORAL_TABLET | Freq: Every day | ORAL | 3 refills | Status: DC

## 2017-07-21 NOTE — Telephone Encounter (Signed)
Returned pt call. Refilled pt tamoxifen to [facility]. Patient is complaining of breaking out in chronic hives. She states she has been to her pcp and an allergist and neither can figure out what the hives are related to. She would like to run this by Dr. Jana Hakim and see if he has any suggestions. Informed pt I would call her back after speaking with Dr. Jana Hakim.  Cyndia Bent RN

## 2017-07-22 ENCOUNTER — Telehealth: Payer: Self-pay | Admitting: *Deleted

## 2017-07-22 NOTE — Telephone Encounter (Signed)
This RN spoke with pt per her concern with onset of hives with negative allergy work up.  She states hives started around " the first week in August of 2018 ".  Note she has been on tamoxifen since 2015.  Above discussed including likely not related to the tamoxifen ( which [name] felt it wasn't either )- and discussed possible skin sensitivity and or related to how her body may release histamine in relation to stress.  Discussed use of Claritin or zyrtec for possible benefit with least side effects.  [name] appreciated discussion and understands to call if above not beneficial.

## 2017-09-08 ENCOUNTER — Telehealth: Payer: Self-pay

## 2017-09-08 NOTE — Telephone Encounter (Signed)
Patient called to confirm next scheduled appointment with Dr. Gwenlyn Perking. Patient given 7/9 3pm MD appointment and 7/5 0800 lab appointment information. Patient verbalized understanding.

## 2017-10-30 ENCOUNTER — Inpatient Hospital Stay: Payer: BLUE CROSS/BLUE SHIELD | Attending: Oncology

## 2017-10-30 DIAGNOSIS — Z9013 Acquired absence of bilateral breasts and nipples: Secondary | ICD-10-CM | POA: Diagnosis not present

## 2017-10-30 DIAGNOSIS — Z923 Personal history of irradiation: Secondary | ICD-10-CM | POA: Insufficient documentation

## 2017-10-30 DIAGNOSIS — C50812 Malignant neoplasm of overlapping sites of left female breast: Secondary | ICD-10-CM | POA: Insufficient documentation

## 2017-10-30 DIAGNOSIS — C778 Secondary and unspecified malignant neoplasm of lymph nodes of multiple regions: Secondary | ICD-10-CM | POA: Insufficient documentation

## 2017-10-30 DIAGNOSIS — Z9221 Personal history of antineoplastic chemotherapy: Secondary | ICD-10-CM | POA: Diagnosis not present

## 2017-10-30 DIAGNOSIS — Z808 Family history of malignant neoplasm of other organs or systems: Secondary | ICD-10-CM | POA: Diagnosis not present

## 2017-10-30 DIAGNOSIS — Z90722 Acquired absence of ovaries, bilateral: Secondary | ICD-10-CM | POA: Diagnosis not present

## 2017-10-30 DIAGNOSIS — I890 Lymphedema, not elsewhere classified: Secondary | ICD-10-CM | POA: Insufficient documentation

## 2017-10-30 DIAGNOSIS — Z170 Estrogen receptor positive status [ER+]: Secondary | ICD-10-CM | POA: Diagnosis not present

## 2017-10-30 DIAGNOSIS — Z79810 Long term (current) use of selective estrogen receptor modulators (SERMs): Secondary | ICD-10-CM | POA: Diagnosis not present

## 2017-10-30 DIAGNOSIS — C50912 Malignant neoplasm of unspecified site of left female breast: Secondary | ICD-10-CM

## 2017-10-30 DIAGNOSIS — C773 Secondary and unspecified malignant neoplasm of axilla and upper limb lymph nodes: Secondary | ICD-10-CM

## 2017-10-30 DIAGNOSIS — Z809 Family history of malignant neoplasm, unspecified: Secondary | ICD-10-CM | POA: Diagnosis not present

## 2017-10-30 LAB — CBC WITH DIFFERENTIAL/PLATELET
BASOS ABS: 0 10*3/uL (ref 0.0–0.1)
BASOS PCT: 1 %
EOS ABS: 0.3 10*3/uL (ref 0.0–0.5)
EOS PCT: 7 %
HCT: 36 % (ref 34.8–46.6)
HEMOGLOBIN: 11.7 g/dL (ref 11.6–15.9)
LYMPHS ABS: 2 10*3/uL (ref 0.9–3.3)
LYMPHS PCT: 46 %
MCH: 27.8 pg (ref 25.1–34.0)
MCHC: 32.5 g/dL (ref 31.5–36.0)
MCV: 85.7 fL (ref 79.5–101.0)
MONO ABS: 0.3 10*3/uL (ref 0.1–0.9)
Monocytes Relative: 7 %
NEUTROS ABS: 1.7 10*3/uL (ref 1.5–6.5)
Neutrophils Relative %: 39 %
Platelets: 245 10*3/uL (ref 145–400)
RBC: 4.2 MIL/uL (ref 3.70–5.45)
RDW: 13.9 % (ref 11.2–14.5)
WBC: 4.3 10*3/uL (ref 3.9–10.3)

## 2017-10-30 LAB — COMPREHENSIVE METABOLIC PANEL
ALBUMIN: 3.7 g/dL (ref 3.5–5.0)
ALT: 13 U/L (ref 0–44)
AST: 15 U/L (ref 15–41)
Alkaline Phosphatase: 51 U/L (ref 38–126)
Anion gap: 8 (ref 5–15)
BUN: 13 mg/dL (ref 6–20)
CHLORIDE: 106 mmol/L (ref 98–111)
CO2: 27 mmol/L (ref 22–32)
Calcium: 9.8 mg/dL (ref 8.9–10.3)
Creatinine, Ser: 0.79 mg/dL (ref 0.44–1.00)
GFR calc Af Amer: >60 mL/min (ref >60)
GFR calc non Af Amer: >60 mL/min (ref >60)
GLUCOSE: 114 mg/dL — AB (ref 70–99)
POTASSIUM: 3.7 mmol/L (ref 3.5–5.1)
SODIUM: 141 mmol/L (ref 135–145)
Total Bilirubin: 0.3 mg/dL (ref 0.3–1.2)
Total Protein: 7.6 g/dL (ref 6.5–8.1)

## 2017-11-02 NOTE — Progress Notes (Signed)
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 A biopsy of the mass in question [facility] showed ([id number]) and invasive lobular carcinoma, which was estrogen and progesterone receptor both 100% positive, both with strong staining intensity, but HER-2 negative at 1+. Bilateral breast MRI 12/13/2009 in [place] showed in the left breast an irregularly marginated mass measuring 3.2 cm. There were 4 or 5 separate satellite lesions laterally and superior to the primary.  Accordingly, after appropriate discussion, the patient underwent bilateral mastectomies with left sentinel lymph node sampling 01/02/2010. The right breast was benign. The left breast showed a 2.3 cm invasive lobular carcinoma, grade 3, with a total of 6 sentinel lymph nodes removed, all negative, although 2 showed isolated tumor cells by immunostaining. Margins were negative.  An Oncotype BX was sent, with a recurrence score of 22, predicting a risk of distant recurrence within 10 years with 14% of the patients only systemic treatment was tamoxifen. The patient was treated with CMF chemotherapy  between 02/22/2010 and 07/05/2010, receiving 7 cycles at which point the patient refused further chemotherapy though there have been no major toxicities at according to the oncology note from Dr. Brent General. The patient was then started on tamoxifen 08/09/2010. By her cancer took approximately 12-15 months then stopped because of hot flashes and insomnia problems.  More recently, the patient noted a change in her left axilla andunderwent bilateral breast MRI January to 2015 at [facility]. The patient has bilateral submuscular silicone implants in place. The breast were unremarkable, but there were several lymph nodes with cortical thickening in the left axilla including a dominant 1 measuring 1.2 cm in the short axis. Ultrasonography of this area identified the lymph node in question and the patient underwent biopsy of the left axillary lymph node This showed ([id number]) near-total replacement of the lymph node by metastatic carcinoma. This was 96% estrogen receptor positive, and 84% progesterone receptor positive, both with strong staining intensity. The MIB-1 was 20%. HER-2 determination is pending.  The patient's subsequent history is as detailed below  INTERVAL HISTORY: [name] returns today for follow-up and treatment of her estrogen receptor positive breast cancer.   The patient continues on tamoxifen, which she tolerates well. Her hot flashes have improved. She denies vaginal wetness.   REVIEW OF SYSTEMS: [name] is doing well, however she reports a summer cold/ congestion. She continues to struggle to lose weight. She does cardio and various other exercises. She tries to eat a lot of meat and vegetables. She avoids eating foods with a lot of sugar. She continues to work at the [employer]. Adding that she sits a lot for work, so she will get up and move around throughout the day. The patient denies unusual headaches, visual changes, nausea, vomiting, or dizziness.  There has been no unusual  cough, phlegm production, or pleurisy. This been no change in bowel or bladder habits. The patient denies unexplained fatigue or unexplained weight loss, bleeding, rash, or fever. A detailed review of systems was otherwise noncontributory.    PAST MEDICAL HISTORY: Past Medical History:  Diagnosis Date  . Back muscle spasm    occasional tx with skelaxin  . Cancer ([place])    breast ca  . GERD (gastroesophageal reflux disease)   . Malignant neoplasm of breast (female), unspecified site [facility])   . Radiation 10/24/13-12/09/13   Left chestwall/supraclav./scar    PAST SURGICAL HISTORY: Past Surgical History:  Procedure Laterality Date  . AXILLARY LYMPH NODE DISSECTION Left 06/16/2013   Procedure: LEFT AXILLARY NODE DISSECTION;  Surgeon: Shann Medal, MD;  Location: [facility];  Service: General;  Laterality: Left;  . BILATERAL TOTAL MASTECTOMY WITH AXILLARY LYMPH NODE DISSECTION    . BREAST SURGERY    . LAPAROSCOPIC BILATERAL SALPINGO OOPHERECTOMY Bilateral 01/25/2014   Procedure: LAPAROSCOPIC BILATERAL SALPINGO OOPHORECTOMY;  Surgeon: Eldred Manges, MD;  Location: [facility];  Service: Gynecology;  Laterality: Bilateral;  . LEEP    . PORT-A-CATH REMOVAL N/A 01/25/2014   Procedure: REMOVAL PORT-A-CATH;  Surgeon: Alphonsa Overall, MD;  Location: [facility];  Service: General;  Laterality: N/A;  . PORTACATH PLACEMENT N/A 06/16/2013   Procedure: POWER PORT PLACEMENT;  Surgeon: Shann Medal, MD;  Location: [facility];  Service: General;  Laterality: N/A;  . right leg surgery     broken femur - has rod in leg  . TONSILLECTOMY    . WISDOM TOOTH EXTRACTION      FAMILY HISTORY Family History  Problem Relation Age of Onset  . Other Mother 48       glioblastoma; deceased 7  . Cancer Mother   . Other Father 23       glioblastoma; deceased 26  . Cancer Father   . Hypertension Other    according to the patient both her parents died from a primary brain cancers, namely we have the stomas, her father at 70, her  mother at 31. The patient had one brother and one sister. There is no history of breast or ovarian cancer in the family  GYNECOLOGIC HISTORY:   (Reviewed 10/10/2013) Menarche age 39. The patient has never carried a child to term. She was still having regular periods at the time of her recent diagnosis. LMP 07/18/2013 as of 09/08/2013.  The patient took oral contraceptives between the ages of 67 and 65 with no complications. She stopped having periods with her chemotherapy in 2015.   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 She currently works as a Research scientist (physical sciences). She shares a home with her significant other, Burnice Logan, who works at [employer] and also in [place]: Not in [place]:  (Reviewed 10/10/2013) Social History   Tobacco Use  . Smoking status: Never Smoker  . Smokeless tobacco: Never Used  Substance Use Topics  . Alcohol use: Yes    Comment: socially wine  . Drug use: No     Colonoscopy: Never  PAP: December 2014  Bone density: Never  Lipid panel: Not on file   Allergies  Allergen Reactions  . Sulfa Antibiotics Hives and Itching  . Gadolinium Derivatives Hives    After gado  injection, pt had a few small hives on left breast area. Treated with benadryl by dr Janeece Fitting   . Petrolatum Hives and Rash    Current Outpatient Medications  Medication Sig Dispense Refill  . aspirin-acetaminophen-caffeine (EXCEDRIN MIGRAINE) 250-250-65 MG per tablet Take by mouth every 6 (six) hours as needed for headache. Reported on 09/06/2015    . metaxalone (SKELAXIN) 800 MG tablet Take 1 tablet (800 mg total) by mouth 3 (three) times daily as needed for muscle spasms. 60 tablet 3  . tamoxifen (NOLVADEX) 20 MG tablet Take 1 tablet (20 mg total) by mouth daily. 90 tablet 3   No current facility-administered  medications for this visit.     OBJECTIVE: Young African American woman  who appears well  Vitals:   11/03/17 1511  BP: 128/90  Pulse: 77  Resp: 18  Temp: 98.8 F (37.1 C)  SpO2: 100%     Body mass index is 33.97 kg/m.    ECOG FS:0 - Asymptomatic Filed Weights   11/03/17 1511  Weight: 216 lb 14.4 oz (98.4 kg)   Sclerae unicteric, pupils round and equal Oropharynx clear and moist No cervical or supraclavicular adenopathy Lungs no rales or rhonchi Heart regular rate and rhythm Abd soft, nontender, positive bowel sounds MSK no focal spinal tenderness, no upper extremity lymphedema Neuro: nonfocal, well oriented, appropriate affect Breasts: She status post bilateral mastectomies with bilateral (smooth silicone) implants in place.  There is no evidence of local recurrence.  Both axillae are benign.  LAB RESULTS:   Lab Results  Component Value Date   WBC 4.3 10/30/2017   NEUTROABS 1.7 10/30/2017   HGB 11.7 10/30/2017   HCT 36.0 10/30/2017   MCV 85.7 10/30/2017   PLT 245 10/30/2017      Chemistry      Component Value Date/Time   NA 141 10/30/2017 0824   NA 141 10/30/2016 0813   K 3.7 10/30/2017 0824   K 4.0 10/30/2016 0813   CL 106 10/30/2017 0824   CO2 27 10/30/2017 0824   CO2 26 10/30/2016 0813   BUN 13 10/30/2017 0824   BUN 15.2 10/30/2016 0813   CREATININE 0.79 10/30/2017 0824   CREATININE 0.8 10/30/2016 0813      Component Value Date/Time   CALCIUM 9.8 10/30/2017 0824   CALCIUM 10.0 10/30/2016 0813   ALKPHOS 51 10/30/2017 0824   ALKPHOS 59 10/30/2016 0813   AST 15 10/30/2017 0824   AST 17 10/30/2016 0813   ALT 13 10/30/2017 0824   ALT 11 10/30/2016 0813   BILITOT 0.3 10/30/2017 0824   BILITOT 0.26 10/30/2016 0813      STUDIES: No results found.  ASSESSMENT: 42 y.o. BRCA negative [place] woman  (1) status post bilateral mastectomies with left axillary lymph node sampling [6 nodes removed] 01/02/2010 for a pT2 pN0(i+), stage IIA invasive  lobular breast cancer, grade 3, estrogen and progesterone receptor positive, HER-2 not amplified; right breast was benign  (2) Oncotype DX score of 22 predicted a 14% risk of distant recurrence within 10 years if the patient's only systemic treatment is tamoxifen for 5 years  (3) status post CMF(cyclophosphamide, fluorouracil, methotrexate) x7 given between October of 2011 and March of 2012 (7 of 8 planned treatments completed)  (4) tamoxifen started April 2012, discontinued approximately July of 2013 (about 15 months) because of problems with hot flashes and insomnia  (5) pathologically documented left axillary recurrence 05/05/2013, the tumor being again estrogen and progesterone receptor positive, with HER-2 not amplified, and  MIB-1 of 15%. Staging CT/PET 05/27/2013 show left axillary and supraclavicular recurrence, no distant disease  (6) status post completion left axillary lymph node dissection 06/16/2013 showing a total of 10/15 lymph nodes involved by tumor, with evidence of extracapsular extension (TX N3 = stage IIIC)  (7) treated with carboplatin/ docetaxel, first dose on 07/11/2013, repeated every 21 days x 4 with Neulasta support on day 2.  Final dose was given on 09/12/2013, with chemotherapy discontinued after 4 cycles due to continuing low counts.  (8) radiation completed 12/09/2013: Left chest wall / 50.4 Gray @ 1.8 Gray per fraction x 28 fractions Left Supraclavicular fossa and PAB/ 45 Gray @1 .8 Gray per fraction x 25 fractions Left scar / 10 Gray at [employer] per fraction x 5 fractions   (9) bilateral salpingo-oophorectomy on 01/25/14  (10)   lymphedema and limited range of motion in the left upper extremity secondary to left axillary lymph node dissection, being treated through the lymphedema clinic  - improved   (13) sequencing and deletion/duplication analysis of 17 genes performed September 2015 at [facility] showed no deleterious mutations in  ATM, BARD1, BRCA1, BRCA2,  BRIP1, CDH1, CHEK2, MRE11A, MUTYH, NBN, NF1, PALB2, PTEN, RAD50, RAD51C, RAD51D, and TP53.  (14) tamoxifen re-started 02/27/2014  PLAN: Verdia is now 4 years out from definitive surgery with no evidence of disease recurrence.  This is very favorable.  She is tolerating tamoxifen well and the plan will be to continue that for a total of 10 years.  She wondered if she needed to have breast MRIs.  Her concern was that there might be recurrence, not implant rupture.  She has absolutely no symptoms regarding her breasts and has not noted any changes.  I reviewed the card that she carries with her and her implants indeed are silicone and they are smooth.  I reassured her that in the absence of symptoms I do not know any indication for her to have a breast MRI.  There are concerns regarding not only cause but also false positives.  We did discuss this at length today and in the end she is comfortable with observation as we are doing  She is interested in weight loss and we discussed diet issues.  She has a good exercise program  She will see me again in 1 year.  She knows to call for any problems that may develop before the next visit.  Gurman Ashland, Virgie Dad, MD  11/03/17 3:26 PM Medical Oncology and Hematology [facility] [address] [place], [place] [address] Tel. [phone]    Fax. [phone]  I, Margit Banda am acting as a scribe for Chauncey Cruel, MD.   I, Lurline Del MD, have reviewed the above documentation for accuracy and completeness, and I agree with the above.

## 2017-11-03 ENCOUNTER — Telehealth: Payer: Self-pay | Admitting: Oncology

## 2017-11-03 ENCOUNTER — Inpatient Hospital Stay (HOSPITAL_BASED_OUTPATIENT_CLINIC_OR_DEPARTMENT_OTHER): Payer: BLUE CROSS/BLUE SHIELD | Admitting: Oncology

## 2017-11-03 VITALS — BP 128/90 | HR 77 | Temp 98.8°F | Resp 18 | Ht 67.0 in | Wt 216.9 lb

## 2017-11-03 DIAGNOSIS — Z808 Family history of malignant neoplasm of other organs or systems: Secondary | ICD-10-CM

## 2017-11-03 DIAGNOSIS — I890 Lymphedema, not elsewhere classified: Secondary | ICD-10-CM | POA: Diagnosis not present

## 2017-11-03 DIAGNOSIS — Z79810 Long term (current) use of selective estrogen receptor modulators (SERMs): Secondary | ICD-10-CM

## 2017-11-03 DIAGNOSIS — Z923 Personal history of irradiation: Secondary | ICD-10-CM

## 2017-11-03 DIAGNOSIS — C50812 Malignant neoplasm of overlapping sites of left female breast: Secondary | ICD-10-CM

## 2017-11-03 DIAGNOSIS — Z9221 Personal history of antineoplastic chemotherapy: Secondary | ICD-10-CM

## 2017-11-03 DIAGNOSIS — Z170 Estrogen receptor positive status [ER+]: Secondary | ICD-10-CM | POA: Diagnosis not present

## 2017-11-03 DIAGNOSIS — Z809 Family history of malignant neoplasm, unspecified: Secondary | ICD-10-CM

## 2017-11-03 DIAGNOSIS — C778 Secondary and unspecified malignant neoplasm of lymph nodes of multiple regions: Secondary | ICD-10-CM | POA: Diagnosis not present

## 2017-11-03 DIAGNOSIS — C773 Secondary and unspecified malignant neoplasm of axilla and upper limb lymph nodes: Secondary | ICD-10-CM

## 2017-11-03 DIAGNOSIS — Z9013 Acquired absence of bilateral breasts and nipples: Secondary | ICD-10-CM

## 2017-11-03 DIAGNOSIS — Z90722 Acquired absence of ovaries, bilateral: Secondary | ICD-10-CM

## 2017-11-03 DIAGNOSIS — C50912 Malignant neoplasm of unspecified site of left female breast: Secondary | ICD-10-CM

## 2017-11-03 MED ORDER — TAMOXIFEN CITRATE 20 MG PO TABS: 20.0000 mg | ORAL_TABLET | Freq: Every day | ORAL | 3 refills | Status: DC

## 2017-11-03 NOTE — Telephone Encounter (Signed)
Gave avs and calendar ° °

## 2017-11-26 ENCOUNTER — Encounter: Payer: Self-pay | Admitting: Oncology

## 2018-01-20 ENCOUNTER — Encounter: Payer: Self-pay | Admitting: Oncology

## 2018-01-25 ENCOUNTER — Telehealth: Payer: Self-pay | Admitting: Oncology

## 2018-01-25 ENCOUNTER — Other Ambulatory Visit: Payer: Self-pay | Admitting: Oncology

## 2018-01-25 DIAGNOSIS — Z170 Estrogen receptor positive status [ER+]: Secondary | ICD-10-CM

## 2018-01-25 DIAGNOSIS — C50812 Malignant neoplasm of overlapping sites of left female breast: Secondary | ICD-10-CM

## 2018-01-25 NOTE — Telephone Encounter (Signed)
Scheduled appt per 9/30 sch message - pt is aware of appt date and time.   

## 2018-01-25 NOTE — Progress Notes (Signed)
[name] called complaining of persistent pain in the left breast.  I think step 1 will be to obtain an MRI to make sure we are not dealing with recurrence.  If the MRI is negative she may need to go back to surgery to see if they feel any surgical indication might help the pain.  Alternatively she could be referred to Dr. Maryjean Ka at the pain clinic.

## 2018-01-26 ENCOUNTER — Other Ambulatory Visit: Payer: Self-pay | Admitting: *Deleted

## 2018-01-26 DIAGNOSIS — Z1231 Encounter for screening mammogram for malignant neoplasm of breast: Secondary | ICD-10-CM

## 2018-01-26 DIAGNOSIS — Z978 Presence of other specified devices: Secondary | ICD-10-CM

## 2018-02-03 ENCOUNTER — Encounter: Payer: Self-pay | Admitting: Adult Health

## 2018-02-03 ENCOUNTER — Other Ambulatory Visit: Payer: Self-pay

## 2018-02-03 MED ORDER — PREDNISONE 50 MG PO TABS: ORAL_TABLET | ORAL | 0 refills | Status: DC

## 2018-02-08 ENCOUNTER — Ambulatory Visit (HOSPITAL_COMMUNITY)
Admission: RE | Admit: 2018-02-08 | Discharge: 2018-02-08 | Disposition: A | Discharge status: Home / Self Care | Payer: BLUE CROSS/BLUE SHIELD | Source: Ambulatory Visit | Attending: Oncology | Admitting: Oncology

## 2018-02-08 ENCOUNTER — Encounter (HOSPITAL_COMMUNITY): Payer: Self-pay

## 2018-02-08 ENCOUNTER — Inpatient Hospital Stay: Payer: BLUE CROSS/BLUE SHIELD | Attending: Oncology

## 2018-02-08 DIAGNOSIS — Z170 Estrogen receptor positive status [ER+]: Secondary | ICD-10-CM | POA: Insufficient documentation

## 2018-02-08 DIAGNOSIS — C50912 Malignant neoplasm of unspecified site of left female breast: Secondary | ICD-10-CM | POA: Diagnosis not present

## 2018-02-08 DIAGNOSIS — C50812 Malignant neoplasm of overlapping sites of left female breast: Secondary | ICD-10-CM | POA: Diagnosis present

## 2018-02-08 DIAGNOSIS — Z90722 Acquired absence of ovaries, bilateral: Secondary | ICD-10-CM | POA: Insufficient documentation

## 2018-02-08 DIAGNOSIS — Z809 Family history of malignant neoplasm, unspecified: Secondary | ICD-10-CM | POA: Diagnosis not present

## 2018-02-08 DIAGNOSIS — Z923 Personal history of irradiation: Secondary | ICD-10-CM | POA: Diagnosis not present

## 2018-02-08 DIAGNOSIS — N644 Mastodynia: Secondary | ICD-10-CM | POA: Diagnosis not present

## 2018-02-08 DIAGNOSIS — C773 Secondary and unspecified malignant neoplasm of axilla and upper limb lymph nodes: Secondary | ICD-10-CM | POA: Diagnosis not present

## 2018-02-08 DIAGNOSIS — Z79810 Long term (current) use of selective estrogen receptor modulators (SERMs): Secondary | ICD-10-CM | POA: Insufficient documentation

## 2018-02-08 DIAGNOSIS — I890 Lymphedema, not elsewhere classified: Secondary | ICD-10-CM | POA: Insufficient documentation

## 2018-02-08 DIAGNOSIS — Z1231 Encounter for screening mammogram for malignant neoplasm of breast: Secondary | ICD-10-CM

## 2018-02-08 DIAGNOSIS — Z978 Presence of other specified devices: Secondary | ICD-10-CM

## 2018-02-08 DIAGNOSIS — Z9013 Acquired absence of bilateral breasts and nipples: Secondary | ICD-10-CM | POA: Diagnosis not present

## 2018-02-08 DIAGNOSIS — Z808 Family history of malignant neoplasm of other organs or systems: Secondary | ICD-10-CM | POA: Diagnosis not present

## 2018-02-08 LAB — COMPREHENSIVE METABOLIC PANEL
ALBUMIN: 3.5 g/dL (ref 3.5–5.0)
ALK PHOS: 67 U/L (ref 38–126)
ALT: 13 U/L (ref 0–44)
ANION GAP: 8 (ref 5–15)
AST: 15 U/L (ref 15–41)
BUN: 10 mg/dL (ref 6–20)
CALCIUM: 9.9 mg/dL (ref 8.9–10.3)
CO2: 24 mmol/L (ref 22–32)
CREATININE: 0.83 mg/dL (ref 0.44–1.00)
Chloride: 109 mmol/L (ref 98–111)
GFR calc non Af Amer: >60 mL/min (ref >60)
GLUCOSE: 115 mg/dL — AB (ref 70–99)
Potassium: 4.1 mmol/L (ref 3.5–5.1)
Sodium: 141 mmol/L (ref 135–145)
TOTAL PROTEIN: 7.9 g/dL (ref 6.5–8.1)

## 2018-02-08 LAB — CBC WITH DIFFERENTIAL/PLATELET
Abs Immature Granulocytes: 0.02 10*3/uL (ref 0.00–0.07)
BASOS PCT: 0 %
Basophils Absolute: 0 10*3/uL (ref 0.0–0.1)
EOS ABS: 0 10*3/uL (ref 0.0–0.5)
EOS PCT: 0 %
HEMATOCRIT: 36.6 % (ref 36.0–46.0)
Hemoglobin: 11.5 g/dL — ABNORMAL LOW (ref 12.0–15.0)
IMMATURE GRANULOCYTES: 0 %
Lymphocytes Relative: 30 %
Lymphs Abs: 1.6 10*3/uL (ref 0.7–4.0)
MCH: 27.1 pg (ref 26.0–34.0)
MCHC: 31.4 g/dL (ref 30.0–36.0)
MCV: 86.3 fL (ref 80.0–100.0)
Monocytes Absolute: 0.3 10*3/uL (ref 0.1–1.0)
Monocytes Relative: 5 %
NEUTROS PCT: 65 %
Neutro Abs: 3.4 10*3/uL (ref 1.7–7.7)
PLATELETS: 252 10*3/uL (ref 150–400)
RBC: 4.24 MIL/uL (ref 3.87–5.11)
RDW: 13.8 % (ref 11.5–15.5)
WBC: 5.2 10*3/uL (ref 4.0–10.5)
nRBC: 0 % (ref 0.0–0.2)

## 2018-02-09 ENCOUNTER — Telehealth: Payer: Self-pay | Admitting: *Deleted

## 2018-02-09 ENCOUNTER — Ambulatory Visit (HOSPITAL_COMMUNITY)
Admission: RE | Admit: 2018-02-09 | Discharge: 2018-02-09 | Disposition: A | Discharge status: Home / Self Care | Payer: BLUE CROSS/BLUE SHIELD | Source: Ambulatory Visit | Attending: Oncology | Admitting: Oncology

## 2018-02-09 ENCOUNTER — Other Ambulatory Visit: Payer: Self-pay | Admitting: Oncology

## 2018-02-09 DIAGNOSIS — Z978 Presence of other specified devices: Secondary | ICD-10-CM | POA: Insufficient documentation

## 2018-02-09 DIAGNOSIS — N6322 Unspecified lump in the left breast, upper inner quadrant: Secondary | ICD-10-CM | POA: Diagnosis not present

## 2018-02-09 MED ORDER — GADOBUTROL 1 MMOL/ML IV SOLN
10.0000 mL | Freq: Once | INTRAVENOUS | Status: AC | PRN
  Administered 2018-02-09: 10 mL via INTRAVENOUS

## 2018-02-09 NOTE — Telephone Encounter (Signed)
This RN spoke with pt per her VM as well as MD request to follow up regarding MRI to verify pt is using contrast allergy premedications.  Smrithi states she is doing the steroids for the allergy concern but she needs medication for relaxation due to the positioning which causes her to move due to " feeling like I can't breath well - the tech said I probably needed something to help me relax "  This RN discussed above - with pt verifying she has someone driving her for scan - valium called in for sedation/relaxation.  No further needs at this time.

## 2018-02-10 ENCOUNTER — Other Ambulatory Visit: Payer: BLUE CROSS/BLUE SHIELD

## 2018-02-10 ENCOUNTER — Telehealth: Payer: Self-pay | Admitting: Adult Health

## 2018-02-10 ENCOUNTER — Inpatient Hospital Stay (HOSPITAL_BASED_OUTPATIENT_CLINIC_OR_DEPARTMENT_OTHER): Payer: BLUE CROSS/BLUE SHIELD | Admitting: Adult Health

## 2018-02-10 ENCOUNTER — Encounter: Payer: Self-pay | Admitting: Adult Health

## 2018-02-10 VITALS — BP 134/75 | HR 66 | Temp 98.4°F | Resp 18 | Ht 67.0 in | Wt 219.8 lb

## 2018-02-10 DIAGNOSIS — I890 Lymphedema, not elsewhere classified: Secondary | ICD-10-CM | POA: Diagnosis not present

## 2018-02-10 DIAGNOSIS — C773 Secondary and unspecified malignant neoplasm of axilla and upper limb lymph nodes: Secondary | ICD-10-CM

## 2018-02-10 DIAGNOSIS — Z90722 Acquired absence of ovaries, bilateral: Secondary | ICD-10-CM

## 2018-02-10 DIAGNOSIS — C50812 Malignant neoplasm of overlapping sites of left female breast: Secondary | ICD-10-CM

## 2018-02-10 DIAGNOSIS — Z9013 Acquired absence of bilateral breasts and nipples: Secondary | ICD-10-CM

## 2018-02-10 DIAGNOSIS — Z808 Family history of malignant neoplasm of other organs or systems: Secondary | ICD-10-CM

## 2018-02-10 DIAGNOSIS — C50912 Malignant neoplasm of unspecified site of left female breast: Secondary | ICD-10-CM | POA: Diagnosis not present

## 2018-02-10 DIAGNOSIS — Z170 Estrogen receptor positive status [ER+]: Secondary | ICD-10-CM

## 2018-02-10 DIAGNOSIS — N644 Mastodynia: Secondary | ICD-10-CM | POA: Diagnosis not present

## 2018-02-10 DIAGNOSIS — Z809 Family history of malignant neoplasm, unspecified: Secondary | ICD-10-CM

## 2018-02-10 DIAGNOSIS — Z923 Personal history of irradiation: Secondary | ICD-10-CM

## 2018-02-10 DIAGNOSIS — Z79810 Long term (current) use of selective estrogen receptor modulators (SERMs): Secondary | ICD-10-CM

## 2018-02-10 NOTE — Telephone Encounter (Signed)
No 10/16 los orders.  °

## 2018-02-10 NOTE — Progress Notes (Addendum)
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 A biopsy of the mass in question [facility] showed ([id number]) and invasive lobular carcinoma, which was estrogen and progesterone receptor both 100% positive, both with strong staining intensity, but HER-2 negative at 1+. Bilateral breast MRI 12/13/2009 in [place] showed in the left breast an irregularly marginated mass measuring 3.2 cm. There were 4 or 5 separate satellite lesions laterally and superior to the primary.  Accordingly, after appropriate discussion, the patient underwent bilateral mastectomies with left sentinel lymph node sampling 01/02/2010. The right breast was benign. The left breast showed a 2.3 cm invasive lobular carcinoma, grade 3, with a total of 6 sentinel lymph nodes removed, all negative, although 2 showed isolated tumor cells by immunostaining. Margins were negative.  An Oncotype BX was sent, with a recurrence score of 22, predicting a risk of distant recurrence within 10 years with 14% of the patients only systemic treatment was tamoxifen. The patient was treated with CMF chemotherapy  between 02/22/2010 and 07/05/2010, receiving 7 cycles at which point the patient refused further chemotherapy though there have been no major toxicities at according to the oncology note from Dr. Brent General. The patient was then started on tamoxifen 08/09/2010. By her cancer took approximately 12-15 months then stopped because of hot flashes and insomnia problems.  More recently, the patient noted a change in her left axilla andunderwent bilateral breast MRI January to 2015 at [facility]. The patient has bilateral submuscular silicone implants in place. The breast were unremarkable, but there were several lymph nodes with cortical thickening in the left axilla including a dominant 1 measuring 1.2 cm in the short axis. Ultrasonography of this area identified the lymph node in question and the patient underwent biopsy of the left axillary lymph node This showed ([id number]) near-total replacement of the lymph node by metastatic carcinoma. This was 96% estrogen receptor positive, and 84% progesterone receptor positive, both with strong staining intensity. The MIB-1 was 20%. HER-2 determination is pending.  The patient's subsequent history is as detailed below  INTERVAL HISTORY: [name] returns today for follow-up and treatment of her estrogen receptor positive breast cancer. She is taking Tamoxifen daily.  She has some occasional hot flashes which are manageable for her.    Her main concern is persistent left breast pain.  I would like to note that she underwent bilateral mastectomies in 2011 and reconstruction with smooth gell implants at that time.  Her plastic surgeon is in [place] (she cannot recall a name).   Her surgeon is Dr. Lucia Gaskins. She had an axillary recurrence in 2014 and underwent ALND, radiation and chemotherapy followed by restarting Tamoxifen.  [name] that since completing adjuvant radiation, her breast is tighter. She Bell that as time has progressed the tightness  and pain has  progressed.  She has constant pain.  Lying down makes it worse.  She Bell her breast is throbbing all day long.   She underwent MRI breast yesterday and those results are still pending.    REVIEW OF SYSTEMS: [name] is doing well otherwise.  She sees her PCP regularly.  She is up to date with GYN evaluation.  She doesn't have someone regularly check her skin. She exercises sometimes.  She Bell that the pain mentioned above is interfering with her ability to exercise.  Otherwise [name] is feeling well.  She denies any unintentional weight loss, headaches, vision changes.  She is without any chest pain, palpitations, cough, or shortness of breath.  She denies any abdominal concerns such as pain, nausea/vomiting, or bowel/bladder changes.  A detailed ROS was otherwise non contributory.   PAST MEDICAL HISTORY: Past Medical History:  Diagnosis Date  . Back muscle spasm    occasional tx with skelaxin  . Cancer ([facility])    breast ca  . GERD (gastroesophageal reflux disease)   . Malignant neoplasm of breast (female), unspecified site   . Radiation 10/24/13-12/09/13   Left chestwall/supraclav./scar    PAST SURGICAL HISTORY: Past Surgical History:  Procedure Laterality Date  . AXILLARY LYMPH NODE DISSECTION Left 06/16/2013   Procedure: LEFT AXILLARY NODE DISSECTION;  Surgeon: Shann Medal, MD;  Location: [facility];  Service: General;  Laterality: Left;  . BILATERAL TOTAL MASTECTOMY WITH AXILLARY LYMPH NODE DISSECTION    . BREAST SURGERY    . LAPAROSCOPIC BILATERAL SALPINGO OOPHERECTOMY Bilateral 01/25/2014   Procedure: LAPAROSCOPIC BILATERAL SALPINGO OOPHORECTOMY;  Surgeon: Eldred Manges, MD;  Location: [facility];  Service: Gynecology;  Laterality: Bilateral;  . LEEP    . PORT-A-CATH REMOVAL N/A 01/25/2014   Procedure: REMOVAL PORT-A-CATH;  Surgeon: Alphonsa Overall, MD;  Location: [facility];  Service: General;  Laterality: N/A;  . PORTACATH PLACEMENT N/A 06/16/2013   Procedure: POWER PORT PLACEMENT;   Surgeon: Shann Medal, MD;  Location: [facility];  Service: General;  Laterality: N/A;  . right leg surgery     broken femur - has rod in leg  . TONSILLECTOMY    . WISDOM TOOTH EXTRACTION      FAMILY HISTORY Family History  Problem Relation Age of Onset  . Other Mother 95       glioblastoma; deceased 71  . Cancer Mother   . Other Father 6       glioblastoma; deceased 32  . Cancer Father   . Hypertension Other    according to the patient both her parents died from a primary brain cancers, namely we have the stomas, her father at 41, her mother at 71. The patient had one brother and one sister. There is no history of breast or ovarian cancer in the family  GYNECOLOGIC HISTORY:   (Reviewed 10/10/2013) Menarche age 2. The patient has never carried a child to term. She was still having regular periods at the time of her recent diagnosis. LMP 07/18/2013 as of 09/08/2013.  The patient took oral contraceptives between the ages of 10 and 78 with no complications. She stopped having periods with her chemotherapy in 2015.   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 She currently works as a Research scientist (physical sciences). She shares a home with her significant other, [name], who works at [employer] and  also in security     ADVANCED DIRECTIVES: Not in place   HEALTH MAINTENANCE:  (Reviewed 10/10/2013) Social History   Tobacco Use  . Smoking status: Never Smoker  . Smokeless tobacco: Never Used  Substance Use Topics  . Alcohol use: Yes    Comment: socially wine  . Drug use: No     Colonoscopy: Never  PAP: December 2014  Bone density: Never  Lipid panel: Not on file   Allergies  Allergen Reactions  . Sulfa Antibiotics Hives and Itching  . Gadolinium Derivatives Hives    After gado injection, pt had a few small hives on left breast area. Treated with  benadryl by dr Janeece Fitting   . Petrolatum Hives and Rash    Current Outpatient Medications  Medication Sig Dispense Refill  . tamoxifen (NOLVADEX) 20 MG tablet Take 1 tablet (20 mg total) by mouth daily. 90 tablet 3   No current facility-administered medications for this visit.     OBJECTIVE:  Vitals:   02/10/18 0840  BP: 134/75  Pulse: 66  Resp: 18  Temp: 98.4 F (36.9 C)  SpO2: 100%     Body mass index is 34.43 kg/m.    ECOG FS:0 - Asymptomatic Filed Weights   02/10/18 0840  Weight: 219 lb 12.8 oz (99.7 kg)   GENERAL: Patient is a well appearing female in no acute distress HEENT:  Sclerae anicteric.  Oropharynx clear and moist. No ulcerations or evidence of oropharyngeal candidiasis. Neck is supple.  NODES:  No cervical, supraclavicular, or axillary lymphadenopathy palpated.  BREAST EXAM:  Left breast is s/p mastectomy with reconstruction, the left breast skin is taut and tenderness is noted throughout the breast on exam, there is no redness, there is no warmth, there is no palpable nodule or mass.  Right breast s/p mastectomy and reconstruction, soft, no nodules or masses noted, no tenderness noted, no swelling noted LUNGS:  Clear to auscultation bilaterally.  No wheezes or rhonchi. HEART:  Regular rate and rhythm. No murmur appreciated. ABDOMEN:  Soft, nontender.  Positive, normoactive bowel sounds. No organomegaly palpated. MSK:  No focal spinal tenderness to palpation. Full range of motion bilaterally in the upper extremities. EXTREMITIES:  Left arm edema noted (has h/o lymphedema--stable per patient)   SKIN:  Clear with no obvious rashes or skin changes. No nail dyscrasia. NEURO:  Nonfocal. Well oriented.  Appropriate affect.   LAB RESULTS:   Lab Results  Component Value Date   WBC 5.2 02/08/2018   NEUTROABS 3.4 02/08/2018   HGB 11.5 (L) 02/08/2018   HCT 36.6 02/08/2018   MCV 86.3 02/08/2018   PLT 252 02/08/2018      Chemistry      Component Value  Date/Time   NA 141 02/08/2018 1435   NA 141 10/30/2016 0813   K 4.1 02/08/2018 1435   K 4.0 10/30/2016 0813   CL 109 02/08/2018 1435   CO2 24 02/08/2018 1435   CO2 26 10/30/2016 0813   BUN 10 02/08/2018 1435   BUN 15.2 10/30/2016 0813   CREATININE 0.83 02/08/2018 1435   CREATININE 0.8 10/30/2016 0813      Component Value Date/Time   CALCIUM 9.9 02/08/2018 1435   CALCIUM 10.0 10/30/2016 0813   ALKPHOS 67 02/08/2018 1435   ALKPHOS 59 10/30/2016 0813   AST 15 02/08/2018 1435   AST 17 10/30/2016 0813   ALT 13 02/08/2018 1435   ALT 11 10/30/2016 0813   BILITOT <0.2 (L) 02/08/2018 1435   BILITOT 0.26  10/30/2016 0813      STUDIES: No results found.  ASSESSMENT: 42 y.o. BRCA negative [place] woman  (1) status post bilateral mastectomies with left axillary lymph node sampling [6 nodes removed] 01/02/2010 for a pT2 pN0(i+), stage IIA invasive lobular breast cancer, grade 3, estrogen and progesterone receptor positive, HER-2 not amplified; right breast was benign  (2) Oncotype DX score of 22 predicted a 14% risk of distant recurrence within 10 years if the patient's only systemic treatment is tamoxifen for 5 years  (3) status post CMF(cyclophosphamide, fluorouracil, methotrexate) x7 given between October of 2011 and March of 2012 (7 of 8 planned treatments completed)  (4) tamoxifen started April 2012, discontinued approximately July of 2013 (about 15 months) because of problems with hot flashes and insomnia  (5) pathologically documented left axillary recurrence 05/05/2013, the tumor being again estrogen and progesterone receptor positive, with HER-2 not amplified, and MIB-1 of 15%. Staging CT/PET 05/27/2013 show left axillary and supraclavicular recurrence, no distant disease  (6) status post completion left axillary lymph node dissection 06/16/2013 showing a total of 10/15 lymph nodes involved by tumor, with evidence of extracapsular extension (TX N3 = stage IIIC)  (7) treated  with carboplatin/ docetaxel, first dose on 07/11/2013, repeated every 21 days x 4 with Neulasta support on day 2.  Final dose was given on 09/12/2013, with chemotherapy discontinued after 4 cycles due to continuing low counts.  (8) radiation completed 12/09/2013: Left chest wall / 50.4 Gray @ 1.8 Gray per fraction x 28 fractions Left Supraclavicular fossa and PAB/ 45 Gray _0 .8 Gray per fraction x 25 fractions Left scar / 10 Gray at [employer] per fraction x 5 fractions   (9) bilateral salpingo-oophorectomy on 01/25/14  (10)   lymphedema and limited range of motion in the left upper extremity secondary to left axillary lymph node dissection, being treated through the lymphedema clinic  - improved   (13) sequencing and deletion/duplication analysis of 17 genes performed September 2015 at [facility] showed no deleterious mutations in  ATM, BARD1, BRCA1, BRCA2, BRIP1, CDH1, CHEK2, MRE11A, MUTYH, NBN, NF1, PALB2, PTEN, RAD50, RAD51C, RAD51D, and TP53.  (14) tamoxifen re-started 02/27/2014  PLAN: [name] is doing moderately well today.  The main issue and concern today is this left breast pain and tightness that has progressively been worsening and is now constant and a throbbing ache.  She underwent MRI breasts yesterday evening and we are still waiting on results.  I reviewed these above issues with Dr. Jana Hakim who also came in, examined her breast and talked to her.  We will call her with the final results once we receive them.  We will also refer her to Dr. Marla Roe in plastic surgery.    [name] and I reviewed her health maintenance which is up to date.  She will continue taking Tamoxifen, and hopefully will be able to exercise more once this pain issue is resolved.    [name] will return in 10/2018 for labs and f/u with Dr. Jana Hakim.  She knows to call for any questions or concerns whatsoever prior to her next appointment.   Stacy Bihari, NP  02/10/18 9:04 AM Medical Oncology and  Hematology [facility] [address] [place], [place] [address] Tel. [phone]    Fax. [phone]   ADDENDUM: I reviewed the breast MRI.  The definitive reading is not yet in.  In the images I am concerned about an area in the medial aspect of the reconstructed left breast, which all may  be scar tissue but certainly looks asymmetrical compared to the right side.  I will await definitive reading by radiology.  If there is any suspicion that this may be recurrence it would have to be biopsied.  In any case I think Sissi is uncomfortable on the left side.  We are referring her to Dr. Elisabeth Cara in plastics to consider revision  I personally saw this patient and performed a substantive portion of this encounter with the listed APP documented above.   Chauncey Cruel, MD Medical Oncology and Hematology [facility] [address] [place], [place] [address] Tel. [phone]    Fax. [phone]

## 2018-02-11 ENCOUNTER — Other Ambulatory Visit: Payer: Self-pay | Admitting: Oncology

## 2018-02-11 ENCOUNTER — Other Ambulatory Visit: Payer: Self-pay | Admitting: Adult Health

## 2018-02-11 DIAGNOSIS — C773 Secondary and unspecified malignant neoplasm of axilla and upper limb lymph nodes: Secondary | ICD-10-CM

## 2018-02-11 DIAGNOSIS — Z170 Estrogen receptor positive status [ER+]: Secondary | ICD-10-CM

## 2018-02-11 DIAGNOSIS — C50812 Malignant neoplasm of overlapping sites of left female breast: Secondary | ICD-10-CM

## 2018-02-11 DIAGNOSIS — C50912 Malignant neoplasm of unspecified site of left female breast: Secondary | ICD-10-CM

## 2018-02-11 DIAGNOSIS — T8543XA Leakage of breast prosthesis and implant, initial encounter: Secondary | ICD-10-CM

## 2018-02-11 NOTE — Progress Notes (Signed)
I called [name] with the results of her MRI.  She is going to need a biopsy if that is feasible.  In any case she is likely going to need to have her left implant removed and she could get her pathology at that time.  We will try to get her in with plastics as soon as possible.

## 2018-02-16 ENCOUNTER — Other Ambulatory Visit: Payer: BLUE CROSS/BLUE SHIELD

## 2018-02-16 ENCOUNTER — Encounter: Payer: Self-pay | Admitting: Plastic Surgery

## 2018-02-16 ENCOUNTER — Ambulatory Visit (INDEPENDENT_AMBULATORY_CARE_PROVIDER_SITE_OTHER): Payer: BLUE CROSS/BLUE SHIELD | Admitting: Plastic Surgery

## 2018-02-16 VITALS — BP 124/84 | HR 90 | Resp 12 | Ht 67.0 in | Wt 217.0 lb

## 2018-02-16 DIAGNOSIS — T8544XA Capsular contracture of breast implant, initial encounter: Secondary | ICD-10-CM | POA: Insufficient documentation

## 2018-02-16 DIAGNOSIS — Z9013 Acquired absence of bilateral breasts and nipples: Secondary | ICD-10-CM | POA: Diagnosis not present

## 2018-02-16 DIAGNOSIS — C50912 Malignant neoplasm of unspecified site of left female breast: Secondary | ICD-10-CM

## 2018-02-16 DIAGNOSIS — C50812 Malignant neoplasm of overlapping sites of left female breast: Secondary | ICD-10-CM | POA: Diagnosis not present

## 2018-02-16 DIAGNOSIS — Z170 Estrogen receptor positive status [ER+]: Secondary | ICD-10-CM

## 2018-02-16 DIAGNOSIS — C773 Secondary and unspecified malignant neoplasm of axilla and upper limb lymph nodes: Secondary | ICD-10-CM

## 2018-02-16 NOTE — Progress Notes (Signed)
Patient ID: [name], female    DOB: July 27, 1975, 42 y.o.   MRN: [id number]   Chief Complaint  Patient presents with  . Breast Problem  . Breast Cancer    The patient is a 42 year old black female here for consultation for breast reconstruction.  She underwent bilateral mastectomies for LEFT breast cancer in 2011 in [place] (ER / PR positive, HER2 negative).  She then had reconstruction with silicone implant placement nipple and areolar reconstruction with tissue rearrangement and tattoo.  She moved to [place] and in 2015 went for routine exam with mammogram.  She was found to have cancer in her left axilla.  She then underwent chemotherapy and radiation which ended in 2015.  Over the past couple of years she has noticed a tightening of the left breast capsule to the point of it being painful.  On exam it is noticeably tight and distorted.  The most recent MRI from August 2019 showed retropectoral implants with a left intracapsular leak and possible extracapsular rupture.  No issues noted with the lymph nodes.  The patient is 5 feet 7 inches tall, weight is 217 pounds.  Current bra size = 38 C.     Review of Systems  Constitutional: Negative.  Negative for activity change and appetite change.  HENT: Negative.   Eyes: Negative.   Respiratory: Negative.   Cardiovascular: Negative.   Gastrointestinal: Negative.  Negative for abdominal distention.  Endocrine: Negative.   Genitourinary: Negative.   Musculoskeletal: Negative.  Negative for back pain.  Skin: Negative.  Negative for color change and wound.  Hematological: Negative.   Psychiatric/Behavioral: Negative.     Past Medical History:  Diagnosis Date  . Back muscle spasm    occasional tx with skelaxin  . Cancer ([place])    breast ca  . GERD (gastroesophageal reflux disease)   . Malignant neoplasm of breast (female), unspecified site   . Radiation 10/24/13-12/09/13   Left chestwall/supraclav./scar    Past Surgical  History:  Procedure Laterality Date  . AXILLARY LYMPH NODE DISSECTION Left 06/16/2013   Procedure: LEFT AXILLARY NODE DISSECTION;  Surgeon: Shann Medal, MD;  Location: [facility];  Service: General;  Laterality: Left;  . BILATERAL TOTAL MASTECTOMY WITH AXILLARY LYMPH NODE DISSECTION    . BREAST SURGERY    . LAPAROSCOPIC BILATERAL SALPINGO OOPHERECTOMY Bilateral 01/25/2014   Procedure: LAPAROSCOPIC BILATERAL SALPINGO OOPHORECTOMY;  Surgeon: Eldred Manges, MD;  Location: [facility];  Service: Gynecology;  Laterality: Bilateral;  . LEEP    . PORT-A-CATH REMOVAL N/A 01/25/2014   Procedure: REMOVAL PORT-A-CATH;  Surgeon: Alphonsa Overall, MD;  Location: [facility];  Service: General;  Laterality: N/A;  . PORTACATH PLACEMENT N/A 06/16/2013   Procedure: POWER PORT PLACEMENT;  Surgeon: Shann Medal, MD;  Location: [facility];  Service: General;  Laterality: N/A;  . right leg surgery     broken femur - has rod in leg  . TONSILLECTOMY    . WISDOM TOOTH EXTRACTION        Current Outpatient Medications:  .  tamoxifen (NOLVADEX) 20 MG tablet, Take 1 tablet (20 mg total) by mouth daily., Disp: 90 tablet, Rfl: 3   Objective:   Vitals:   02/16/18 0852  BP: 124/84  Pulse: 90  Resp: 12  SpO2: 97%    Physical Exam  Constitutional: She is oriented to person, place, and time. She appears well-developed and well-nourished.  HENT:  Head: Normocephalic and atraumatic.  Eyes: Pupils are equal,  round, and reactive to light. EOM are normal.  Cardiovascular: Normal rate and regular rhythm.  Pulmonary/Chest: Effort normal.  Abdominal: Soft. She exhibits no distension.  Neurological: She is alert and oriented to person, place, and time.  Skin: Skin is warm.  Psychiatric: She has a normal mood and affect. Her behavior is normal. Judgment and thought content normal.    Assessment & Plan:  Breast cancer metastasized to axillary lymph node, left ([facility])  Acquired absence of breast and absent nipple,  bilateral  Malignant neoplasm of overlapping sites of left breast in female, estrogen receptor positive ([facility])  Capsular contracture of breast implant, initial encounter  Assessment and Plan:  A long, detailed conversation was had regarding the patient's options for breast reconstruction. Five main points, which are explained to all breast reconstruction patients, were discussed.  1. Breast reconstruction is an optional process.  2. Breast reconstruction is a multi-stage process which involves multiple surgeries spaced several months apart. The entire process can take over one year.  3. The major goal of breast reconstruction is to have the patient look normal in clothing. When naked, there will always be scars.  4. Asymmetries are often present during the reconstruction process. Several operations may be needed, including surgery to the non-cancerous breast, to achieve satisfactory results.  5. No matter the reconstructive method, there are ways that the reconstruction can fail and a secondary reconstructive plan would need to be created.   A general discussion regarding all available methods of breast reconstruction were discussed. The types of reconstructions described included.  1. Tissue expander and implant based reconstruction, both single and multi-stage approaches.  2. Autologous only reconstructions, including free abdominal-tissue based reconstructions.  3. Combination procedures, particularly latissismus dorsi flaps combined with either expanders or implants.  For each of the reconstruction methods mentioned above, the risks, benefits, alternatives, scarring, and recovery time were discussed in great detail. Specific risks detailed included bleeding, infection, hematoma, seroma, scarring, pain, wound healing complications, flap loss, fat necrosis, capsular contracture, need for implant removal, donor site complications, bulge, hernia, umbilical necrosis, need for urgent reoperation, and  need for dressing changes were discussed.   Assessment  Once all reconstruction options were presented, a focused discussion was had regarding the patient's suitability for each of these procedures.  A total of 50 minutes of face-to-face time was spent in this encounter, of which >50% was spent in counseling. In this patient's case we have a challenge with the radiation to the left breast.  Fortunately it was 4 years ago.  She is still at risk of healing after any surgical interventions.  This was described to the patient in detail.  We discussed the option of replacement of the implants with smaller implants and capsule resection on the left.  The other option is for a latissimus muscle flap on the left.  Replacement of the right implant.  The patient is willing to go smaller to try to avoid a muscle flap.  She currently has high profile Mentor 725 cc silicone implants.  She would likely be around 600 cc implant.  We discussed the importance of improving her nutritional status which she can do between now and surgery.    [place], DO

## 2018-02-18 ENCOUNTER — Ambulatory Visit
Admission: RE | Admit: 2018-02-18 | Discharge: 2018-02-18 | Disposition: A | Discharge status: Home / Self Care | Payer: BLUE CROSS/BLUE SHIELD | Source: Ambulatory Visit | Attending: Oncology | Admitting: Oncology

## 2018-02-18 ENCOUNTER — Other Ambulatory Visit: Payer: Self-pay | Admitting: Oncology

## 2018-02-18 DIAGNOSIS — C50912 Malignant neoplasm of unspecified site of left female breast: Secondary | ICD-10-CM

## 2018-02-18 DIAGNOSIS — C50812 Malignant neoplasm of overlapping sites of left female breast: Secondary | ICD-10-CM

## 2018-02-18 DIAGNOSIS — Z170 Estrogen receptor positive status [ER+]: Secondary | ICD-10-CM

## 2018-02-18 DIAGNOSIS — C773 Secondary and unspecified malignant neoplasm of axilla and upper limb lymph nodes: Secondary | ICD-10-CM

## 2018-02-18 DIAGNOSIS — N6320 Unspecified lump in the left breast, unspecified quadrant: Secondary | ICD-10-CM

## 2018-03-02 ENCOUNTER — Ambulatory Visit: Payer: BLUE CROSS/BLUE SHIELD | Admitting: Plastic Surgery

## 2018-03-09 ENCOUNTER — Encounter: Payer: Self-pay | Admitting: Plastic Surgery

## 2018-03-09 ENCOUNTER — Ambulatory Visit (INDEPENDENT_AMBULATORY_CARE_PROVIDER_SITE_OTHER): Payer: Self-pay | Admitting: Plastic Surgery

## 2018-03-09 VITALS — BP 138/88 | HR 89 | Ht 67.0 in | Wt 215.0 lb

## 2018-03-09 DIAGNOSIS — Z9013 Acquired absence of bilateral breasts and nipples: Secondary | ICD-10-CM

## 2018-03-09 DIAGNOSIS — T8544XA Capsular contracture of breast implant, initial encounter: Secondary | ICD-10-CM

## 2018-03-09 DIAGNOSIS — C50812 Malignant neoplasm of overlapping sites of left female breast: Secondary | ICD-10-CM

## 2018-03-09 DIAGNOSIS — Z170 Estrogen receptor positive status [ER+]: Secondary | ICD-10-CM

## 2018-03-09 MED ORDER — HYDROCODONE-ACETAMINOPHEN 5-325 MG PO TABS: 1.0000 | ORAL_TABLET | Freq: Four times a day (QID) | ORAL | 0 refills | Status: DC | PRN

## 2018-03-09 MED ORDER — CEPHALEXIN 500 MG PO CAPS: 500.0000 mg | ORAL_CAPSULE | Freq: Four times a day (QID) | ORAL | 0 refills | Status: AC

## 2018-03-09 MED ORDER — ONDANSETRON HCL 4 MG PO TABS: 4.0000 mg | ORAL_TABLET | Freq: Every day | ORAL | 0 refills | Status: DC | PRN

## 2018-03-09 MED ORDER — DIAZEPAM 2 MG PO TABS: 2.0000 mg | ORAL_TABLET | Freq: Three times a day (TID) | ORAL | 0 refills | Status: DC | PRN

## 2018-03-09 NOTE — H&P (View-Only) (Signed)
Patient ID: [name], female    DOB: April 24, 1976, 42 y.o.   MRN: [id number]   No chief complaint on file.   The patient is a 42 year old black female here for her history and physical for breast reconstruction.  She had bilateral mastectomies for a left-sided breast cancer in 2011 (ER/PR positive, HER-2 negative).  She had reconstruction with silicone implants and nipple areolar reconstruction with tissue rearrangement and tattoo enhancement.  All of this was done in [place].  She moved to [place] in 2015.  A routine mammogram showed left axillary cancer.  She was treated with chemotherapy and radiation.  She has noticed increasing pain and tightness of the left breast capsule.  In August 2019 she had a MRI which showed a retropectoral implant with left intracapsular leak and possibly even extracapsular rupture.  She is 5 feet 7 inches tall, weight is 217.  Preop bra today is 38 C.   Review of Systems  Constitutional: Negative.   HENT: Negative.   Eyes: Negative.   Respiratory: Negative.   Cardiovascular: Negative.   Gastrointestinal: Negative.   Endocrine: Negative.   Genitourinary: Negative.   Musculoskeletal: Negative.   Skin: Negative.   Allergic/Immunologic: Negative.   Neurological: Negative.   Hematological: Negative.   Psychiatric/Behavioral: Negative.     Past Medical History:  Diagnosis Date  . Back muscle spasm    occasional tx with skelaxin  . Cancer ([place])    breast ca  . GERD (gastroesophageal reflux disease)   . Malignant neoplasm of breast (female), unspecified site   . Radiation 10/24/13-12/09/13   Left chestwall/supraclav./scar    Past Surgical History:  Procedure Laterality Date  . AXILLARY LYMPH NODE DISSECTION Left 06/16/2013   Procedure: LEFT AXILLARY NODE DISSECTION;  Surgeon: Shann Medal, MD;  Location: [facility];  Service: General;  Laterality: Left;  . BILATERAL TOTAL MASTECTOMY WITH AXILLARY LYMPH NODE DISSECTION    . BREAST  SURGERY    . LAPAROSCOPIC BILATERAL SALPINGO OOPHERECTOMY Bilateral 01/25/2014   Procedure: LAPAROSCOPIC BILATERAL SALPINGO OOPHORECTOMY;  Surgeon: Eldred Manges, MD;  Location: [facility];  Service: Gynecology;  Laterality: Bilateral;  . LEEP    . PORT-A-CATH REMOVAL N/A 01/25/2014   Procedure: REMOVAL PORT-A-CATH;  Surgeon: Alphonsa Overall, MD;  Location: [facility];  Service: General;  Laterality: N/A;  . PORTACATH PLACEMENT N/A 06/16/2013   Procedure: POWER PORT PLACEMENT;  Surgeon: Shann Medal, MD;  Location: [facility];  Service: General;  Laterality: N/A;  . right leg surgery     broken femur - has rod in leg  . TONSILLECTOMY    . WISDOM TOOTH EXTRACTION        Current Outpatient Medications:  .  tamoxifen (NOLVADEX) 20 MG tablet, Take 1 tablet (20 mg total) by mouth daily., Disp: 90 tablet, Rfl: 3   Objective:   Vitals:   03/09/18 1519  BP: 138/88  Pulse: 89  SpO2: 98%    Physical Exam  Constitutional: She is oriented to person, place, and time. She appears well-developed and well-nourished.  HENT:  Head: Normocephalic and atraumatic.  Eyes: Pupils are equal, round, and reactive to light. EOM are normal.  Cardiovascular: Normal rate.  Pulmonary/Chest: Effort normal.  Abdominal: Soft.  Neurological: She is alert and oriented to person, place, and time.  Psychiatric: She has a normal mood and affect. Her behavior is normal. Judgment and thought content normal.    Assessment & Plan:  Capsular contracture of breast implant, initial  encounter  Acquired absence of breast and absent nipple, bilateral  Malignant neoplasm of overlapping sites of left breast in female, estrogen receptor positive ([place])   Plan for removal of bilateral implants.  Placement of new silicone implants  The risks that can be encountered with and after placement of a breast implants were discussed and include the following but not limited to these: bleeding, infection, delayed healing, anesthesia risks,  skin sensation changes, injury to structures including nerves, blood vessels, and muscles which may be temporary or permanent, allergies to tape, suture materials and glues, blood products, topical preparations or injected agents, skin contour irregularities, skin discoloration and swelling, deep vein thrombosis, cardiac and pulmonary complications, pain, which may persist, fluid accumulation, wrinkling of the skin over the expander, changes in nipple or breast sensation, expander leakage or rupture, faulty position of the expander, persistent pain, formation of tight scar tissue around the expander (capsular contracture), possible need for revisional surgery or staged procedures.  [place], DO

## 2018-03-09 NOTE — Progress Notes (Signed)
   Patient ID: [name], female    DOB: 01/28/1976, 42 y.o.   MRN: [id number]   No chief complaint on file.   The patient is a 42-year-old black female here for her history and physical for breast reconstruction.  She had bilateral mastectomies for a left-sided breast cancer in 2011 (ER/PR positive, HER-2 negative).  She had reconstruction with silicone implants and nipple areolar reconstruction with tissue rearrangement and tattoo enhancement.  All of this was done in [place].  She moved to [place] in 2015.  A routine mammogram showed left axillary cancer.  She was treated with chemotherapy and radiation.  She has noticed increasing pain and tightness of the left breast capsule.  In August 2019 she had a MRI which showed a retropectoral implant with left intracapsular leak and possibly even extracapsular rupture.  She is 5 feet 7 inches tall, weight is 217.  Preop bra today is 38 C.   Review of Systems  Constitutional: Negative.   HENT: Negative.   Eyes: Negative.   Respiratory: Negative.   Cardiovascular: Negative.   Gastrointestinal: Negative.   Endocrine: Negative.   Genitourinary: Negative.   Musculoskeletal: Negative.   Skin: Negative.   Allergic/Immunologic: Negative.   Neurological: Negative.   Hematological: Negative.   Psychiatric/Behavioral: Negative.     Past Medical History:  Diagnosis Date  . Back muscle spasm    occasional tx with skelaxin  . Cancer ([facility])    breast ca  . GERD (gastroesophageal reflux disease)   . Malignant neoplasm of breast (female), unspecified site   . Radiation 10/24/13-12/09/13   Left chestwall/supraclav./scar    Past Surgical History:  Procedure Laterality Date  . AXILLARY LYMPH NODE DISSECTION Left 06/16/2013   Procedure: LEFT AXILLARY NODE DISSECTION;  Surgeon: David H Newman, MD;  Location: [facility];  Service: General;  Laterality: Left;  . BILATERAL TOTAL MASTECTOMY WITH AXILLARY LYMPH NODE DISSECTION    . BREAST  SURGERY    . LAPAROSCOPIC BILATERAL SALPINGO OOPHERECTOMY Bilateral 01/25/2014   Procedure: LAPAROSCOPIC BILATERAL SALPINGO OOPHORECTOMY;  Surgeon: Vanessa P Haygood, MD;  Location: [facility];  Service: Gynecology;  Laterality: Bilateral;  . LEEP    . PORT-A-CATH REMOVAL N/A 01/25/2014   Procedure: REMOVAL PORT-A-CATH;  Surgeon: David Newman, MD;  Location: [facility];  Service: General;  Laterality: N/A;  . PORTACATH PLACEMENT N/A 06/16/2013   Procedure: POWER PORT PLACEMENT;  Surgeon: David H Newman, MD;  Location: [facility];  Service: General;  Laterality: N/A;  . right leg surgery     broken femur - has rod in leg  . TONSILLECTOMY    . WISDOM TOOTH EXTRACTION        Current Outpatient Medications:  .  tamoxifen (NOLVADEX) 20 MG tablet, Take 1 tablet (20 mg total) by mouth daily., Disp: 90 tablet, Rfl: 3   Objective:   Vitals:   03/09/18 1519  BP: 138/88  Pulse: 89  SpO2: 98%    Physical Exam  Constitutional: She is oriented to person, place, and time. She appears well-developed and well-nourished.  HENT:  Head: Normocephalic and atraumatic.  Eyes: Pupils are equal, round, and reactive to light. EOM are normal.  Cardiovascular: Normal rate.  Pulmonary/Chest: Effort normal.  Abdominal: Soft.  Neurological: She is alert and oriented to person, place, and time.  Psychiatric: She has a normal mood and affect. Her behavior is normal. Judgment and thought content normal.    Assessment & Plan:  Capsular contracture of breast implant, initial   encounter  Acquired absence of breast and absent nipple, bilateral  Malignant neoplasm of overlapping sites of left breast in female, estrogen receptor positive ([facility])   Plan for removal of bilateral implants.  Placement of new silicone implants  The risks that can be encountered with and after placement of a breast implants were discussed and include the following but not limited to these: bleeding, infection, delayed healing, anesthesia risks,  skin sensation changes, injury to structures including nerves, blood vessels, and muscles which may be temporary or permanent, allergies to tape, suture materials and glues, blood products, topical preparations or injected agents, skin contour irregularities, skin discoloration and swelling, deep vein thrombosis, cardiac and pulmonary complications, pain, which may persist, fluid accumulation, wrinkling of the skin over the expander, changes in nipple or breast sensation, expander leakage or rupture, faulty position of the expander, persistent pain, formation of tight scar tissue around the expander (capsular contracture), possible need for revisional surgery or staged procedures.  Shakara Tweedy S Karston Hyland, DO 

## 2018-03-12 ENCOUNTER — Other Ambulatory Visit: Payer: Self-pay

## 2018-03-12 ENCOUNTER — Encounter (HOSPITAL_BASED_OUTPATIENT_CLINIC_OR_DEPARTMENT_OTHER): Payer: Self-pay | Admitting: *Deleted

## 2018-03-18 ENCOUNTER — Encounter (HOSPITAL_BASED_OUTPATIENT_CLINIC_OR_DEPARTMENT_OTHER)
Admission: RE | Disposition: A | Discharge status: Home / Self Care | Payer: Self-pay | Source: Ambulatory Visit | Attending: Plastic Surgery

## 2018-03-18 ENCOUNTER — Other Ambulatory Visit: Payer: Self-pay

## 2018-03-18 ENCOUNTER — Ambulatory Visit (HOSPITAL_BASED_OUTPATIENT_CLINIC_OR_DEPARTMENT_OTHER): Payer: BLUE CROSS/BLUE SHIELD | Admitting: Anesthesiology

## 2018-03-18 ENCOUNTER — Ambulatory Visit (HOSPITAL_BASED_OUTPATIENT_CLINIC_OR_DEPARTMENT_OTHER)
Admission: RE | Admit: 2018-03-18 | Discharge: 2018-03-18 | Disposition: A | Discharge status: Home / Self Care | Payer: BLUE CROSS/BLUE SHIELD | Source: Ambulatory Visit | Attending: Plastic Surgery | Admitting: Plastic Surgery

## 2018-03-18 ENCOUNTER — Encounter (HOSPITAL_BASED_OUTPATIENT_CLINIC_OR_DEPARTMENT_OTHER): Payer: Self-pay | Admitting: Anesthesiology

## 2018-03-18 DIAGNOSIS — T8544XA Capsular contracture of breast implant, initial encounter: Secondary | ICD-10-CM | POA: Insufficient documentation

## 2018-03-18 DIAGNOSIS — Z9013 Acquired absence of bilateral breasts and nipples: Secondary | ICD-10-CM | POA: Diagnosis not present

## 2018-03-18 DIAGNOSIS — Y818 Miscellaneous general- and plastic-surgery devices associated with adverse incidents, not elsewhere classified: Secondary | ICD-10-CM | POA: Diagnosis not present

## 2018-03-18 DIAGNOSIS — Z853 Personal history of malignant neoplasm of breast: Secondary | ICD-10-CM | POA: Insufficient documentation

## 2018-03-18 DIAGNOSIS — T8541XA Breakdown (mechanical) of breast prosthesis and implant, initial encounter: Secondary | ICD-10-CM | POA: Diagnosis present

## 2018-03-18 DIAGNOSIS — T8543XA Leakage of breast prosthesis and implant, initial encounter: Secondary | ICD-10-CM

## 2018-03-18 HISTORY — PX: PLACEMENT OF BREAST IMPLANTS: SHX6334

## 2018-03-18 HISTORY — PX: CAPSULECTOMY: SHX5381

## 2018-03-18 HISTORY — PX: BREAST IMPLANT REMOVAL: SHX5361

## 2018-03-18 SURGERY — REMOVAL, IMPLANT, BREAST
Anesthesia: General | Site: Breast | Laterality: Bilateral

## 2018-03-18 MED ORDER — BUPIVACAINE-EPINEPHRINE (PF) 0.25% -1:200000 IJ SOLN
INTRAMUSCULAR | Status: AC
  Filled 2018-03-18: qty 60

## 2018-03-18 MED ORDER — SUCCINYLCHOLINE CHLORIDE 200 MG/10ML IV SOSY
PREFILLED_SYRINGE | INTRAVENOUS | Status: AC
  Filled 2018-03-18: qty 10

## 2018-03-18 MED ORDER — EPHEDRINE 5 MG/ML INJ
INTRAVENOUS | Status: AC
  Filled 2018-03-18: qty 10

## 2018-03-18 MED ORDER — PHENYLEPHRINE 40 MCG/ML (10ML) SYRINGE FOR IV PUSH (FOR BLOOD PRESSURE SUPPORT)
PREFILLED_SYRINGE | INTRAVENOUS | Status: AC
  Filled 2018-03-18: qty 10

## 2018-03-18 MED ORDER — ONDANSETRON HCL 4 MG/2ML IJ SOLN
INTRAMUSCULAR | Status: DC | PRN
  Administered 2018-03-18: 4 mg via INTRAVENOUS

## 2018-03-18 MED ORDER — BUPIVACAINE HCL (PF) 0.25 % IJ SOLN
INTRAMUSCULAR | Status: AC
  Filled 2018-03-18: qty 30

## 2018-03-18 MED ORDER — FENTANYL CITRATE (PF) 100 MCG/2ML IJ SOLN
50.0000 ug | INTRAMUSCULAR | Status: AC | PRN
  Administered 2018-03-18: 25 ug via INTRAVENOUS
  Administered 2018-03-18 (×2): 50 ug via INTRAVENOUS
  Administered 2018-03-18: 25 ug via INTRAVENOUS

## 2018-03-18 MED ORDER — LACTATED RINGERS IV SOLN
INTRAVENOUS | Status: DC | PRN
  Administered 2018-03-18 (×2): via INTRAVENOUS

## 2018-03-18 MED ORDER — PROMETHAZINE HCL 25 MG/ML IJ SOLN
6.2500 mg | Freq: Once | INTRAMUSCULAR | Status: AC
  Administered 2018-03-18: 6.25 mg via INTRAVENOUS

## 2018-03-18 MED ORDER — MIDAZOLAM HCL 2 MG/2ML IJ SOLN
INTRAMUSCULAR | Status: AC
  Filled 2018-03-18: qty 2

## 2018-03-18 MED ORDER — MEPERIDINE HCL 25 MG/ML IJ SOLN: 6.2500 mg | INTRAMUSCULAR | Status: DC | PRN

## 2018-03-18 MED ORDER — BUPIVACAINE-EPINEPHRINE (PF) 0.25% -1:200000 IJ SOLN
INTRAMUSCULAR | Status: AC
  Filled 2018-03-18: qty 30

## 2018-03-18 MED ORDER — FENTANYL CITRATE (PF) 100 MCG/2ML IJ SOLN
INTRAMUSCULAR | Status: AC
  Filled 2018-03-18: qty 2

## 2018-03-18 MED ORDER — CEFAZOLIN SODIUM-DEXTROSE 2-4 GM/100ML-% IV SOLN: 2.0000 g | INTRAVENOUS | Status: DC

## 2018-03-18 MED ORDER — SODIUM CHLORIDE 0.9 % IV SOLN
INTRAVENOUS | Status: DC | PRN
  Administered 2018-03-18: 500 mL

## 2018-03-18 MED ORDER — ONDANSETRON HCL 4 MG/2ML IJ SOLN
INTRAMUSCULAR | Status: AC
  Filled 2018-03-18: qty 2

## 2018-03-18 MED ORDER — LACTATED RINGERS IV SOLN
INTRAVENOUS | Status: DC
  Administered 2018-03-18: 07:00:00 via INTRAVENOUS

## 2018-03-18 MED ORDER — PROPOFOL 10 MG/ML IV BOLUS
INTRAVENOUS | Status: DC | PRN
  Administered 2018-03-18: 150 mg via INTRAVENOUS

## 2018-03-18 MED ORDER — CEFAZOLIN SODIUM-DEXTROSE 2-4 GM/100ML-% IV SOLN
INTRAVENOUS | Status: AC
  Filled 2018-03-18: qty 100

## 2018-03-18 MED ORDER — HYDROMORPHONE HCL 1 MG/ML IJ SOLN
0.2500 mg | INTRAMUSCULAR | Status: DC | PRN
  Administered 2018-03-18: 0.5 mg via INTRAVENOUS

## 2018-03-18 MED ORDER — DEXAMETHASONE SODIUM PHOSPHATE 10 MG/ML IJ SOLN
INTRAMUSCULAR | Status: AC
  Filled 2018-03-18: qty 1

## 2018-03-18 MED ORDER — ONDANSETRON HCL 4 MG/2ML IJ SOLN
4.0000 mg | Freq: Once | INTRAMUSCULAR | Status: AC | PRN
  Administered 2018-03-18: 4 mg via INTRAVENOUS

## 2018-03-18 MED ORDER — LIDOCAINE HCL (CARDIAC) PF 100 MG/5ML IV SOSY
PREFILLED_SYRINGE | INTRAVENOUS | Status: DC | PRN
  Administered 2018-03-18: 100 mg via INTRAVENOUS

## 2018-03-18 MED ORDER — MIDAZOLAM HCL 2 MG/2ML IJ SOLN
1.0000 mg | INTRAMUSCULAR | Status: DC | PRN
  Administered 2018-03-18: 2 mg via INTRAVENOUS

## 2018-03-18 MED ORDER — HYDROMORPHONE HCL 1 MG/ML IJ SOLN
INTRAMUSCULAR | Status: AC
  Filled 2018-03-18: qty 0.5

## 2018-03-18 MED ORDER — LIDOCAINE 2% (20 MG/ML) 5 ML SYRINGE
INTRAMUSCULAR | Status: AC
  Filled 2018-03-18: qty 5

## 2018-03-18 MED ORDER — BUPIVACAINE-EPINEPHRINE 0.25% -1:200000 IJ SOLN
INTRAMUSCULAR | Status: DC | PRN
  Administered 2018-03-18: 6 mL

## 2018-03-18 MED ORDER — PROMETHAZINE HCL 25 MG/ML IJ SOLN
INTRAMUSCULAR | Status: AC
  Filled 2018-03-18: qty 1

## 2018-03-18 MED ORDER — SODIUM CHLORIDE 0.9 % IV SOLN
INTRAVENOUS | Status: AC
  Filled 2018-03-18 (×2): qty 500000

## 2018-03-18 MED ORDER — SCOPOLAMINE 1 MG/3DAYS TD PT72: 1.0000 | MEDICATED_PATCH | Freq: Once | TRANSDERMAL | Status: DC | PRN

## 2018-03-18 MED ORDER — DEXAMETHASONE SODIUM PHOSPHATE 4 MG/ML IJ SOLN
INTRAMUSCULAR | Status: DC | PRN
  Administered 2018-03-18: 10 mg via INTRAVENOUS

## 2018-03-18 SURGICAL SUPPLY — 75 items
BAG DECANTER FOR FLEXI CONT (MISCELLANEOUS) ×2 IMPLANT
BINDER BREAST LRG (GAUZE/BANDAGES/DRESSINGS) IMPLANT
BINDER BREAST XLRG (GAUZE/BANDAGES/DRESSINGS) ×2 IMPLANT
BINDER BREAST XXLRG (GAUZE/BANDAGES/DRESSINGS) IMPLANT
BIOPATCH RED 1 DISK 7.0 (GAUZE/BANDAGES/DRESSINGS) IMPLANT
BLADE HEX COATED 2.75 (ELECTRODE) ×2 IMPLANT
BLADE SURG 15 STRL LF DISP TIS (BLADE) ×2 IMPLANT
BLADE SURG 15 STRL SS (BLADE) ×2
BNDG GAUZE ELAST 4 BULKY (GAUZE/BANDAGES/DRESSINGS) IMPLANT
CANISTER SUCT 1200ML W/VALVE (MISCELLANEOUS) ×2 IMPLANT
CHLORAPREP W/TINT 26ML (MISCELLANEOUS) ×2 IMPLANT
COVER BACK TABLE 60X90IN (DRAPES) ×2 IMPLANT
COVER MAYO STAND STRL (DRAPES) ×2 IMPLANT
COVER WAND RF STERILE (DRAPES) IMPLANT
DECANTER SPIKE VIAL GLASS SM (MISCELLANEOUS) IMPLANT
DERMABOND ADVANCED (GAUZE/BANDAGES/DRESSINGS) ×2
DERMABOND ADVANCED .7 DNX12 (GAUZE/BANDAGES/DRESSINGS) ×2 IMPLANT
DRAIN CHANNEL 19F RND (DRAIN) IMPLANT
DRAPE LAPAROSCOPIC ABDOMINAL (DRAPES) ×2 IMPLANT
DRSG PAD ABDOMINAL 8X10 ST (GAUZE/BANDAGES/DRESSINGS) ×4 IMPLANT
ELECT BLADE 4.0 EZ CLEAN MEGAD (MISCELLANEOUS) ×2
ELECT BLADE 6.5 EXT (BLADE) IMPLANT
ELECT REM PT RETURN 9FT ADLT (ELECTROSURGICAL) ×2
ELECTRODE BLDE 4.0 EZ CLN MEGD (MISCELLANEOUS) ×1 IMPLANT
ELECTRODE REM PT RTRN 9FT ADLT (ELECTROSURGICAL) ×1 IMPLANT
EVACUATOR SILICONE 100CC (DRAIN) IMPLANT
GAUZE SPONGE 4X4 12PLY STRL LF (GAUZE/BANDAGES/DRESSINGS) IMPLANT
GLOVE BIO SURGEON STRL SZ 6.5 (GLOVE) ×14 IMPLANT
GOWN STRL REUS W/ TWL LRG LVL3 (GOWN DISPOSABLE) ×3 IMPLANT
GOWN STRL REUS W/TWL LRG LVL3 (GOWN DISPOSABLE) ×3
IMPL GEL HP 590CC (Breast) ×2 IMPLANT
IMPLANT GEL HP 590CC (Breast) ×4 IMPLANT
IV NS 1000ML (IV SOLUTION)
IV NS 1000ML BAXH (IV SOLUTION) IMPLANT
IV NS 500ML (IV SOLUTION)
IV NS 500ML BAXH (IV SOLUTION) IMPLANT
KIT FILL SYSTEM UNIVERSAL (SET/KITS/TRAYS/PACK) IMPLANT
NDL SAFETY ECLIPSE 18X1.5 (NEEDLE) IMPLANT
NEEDLE HYPO 18GX1.5 SHARP (NEEDLE)
NEEDLE HYPO 25X1 1.5 SAFETY (NEEDLE) ×2 IMPLANT
NEEDLE SPNL 18GX3.5 QUINCKE PK (NEEDLE) IMPLANT
NS IRRIG 1000ML POUR BTL (IV SOLUTION) ×2 IMPLANT
PACK BASIN DAY SURGERY FS (CUSTOM PROCEDURE TRAY) ×2 IMPLANT
PENCIL BUTTON HOLSTER BLD 10FT (ELECTRODE) ×2 IMPLANT
PIN SAFETY STERILE (MISCELLANEOUS) IMPLANT
SIZER BREAST REUSE 590CC (SIZER) ×2
SIZER BREAST REUSE 650CC (SIZER) ×1
SIZER BRST REUSE 590CC (SIZER) ×1 IMPLANT
SIZER BRST REUSE P6.4 650CC (SIZER) ×1 IMPLANT
SLEEVE SCD COMPRESS KNEE MED (MISCELLANEOUS) ×2 IMPLANT
SPONGE LAP 18X18 RF (DISPOSABLE) ×6 IMPLANT
STAPLER VISISTAT 35W (STAPLE) IMPLANT
STRIP CLOSURE SKIN 1/2X4 (GAUZE/BANDAGES/DRESSINGS) ×2 IMPLANT
STRIP SUTURE WOUND CLOSURE 1/2 (SUTURE) IMPLANT
SUT MNCRL AB 4-0 PS2 18 (SUTURE) ×6 IMPLANT
SUT MON AB 3-0 SH 27 (SUTURE) ×3
SUT MON AB 3-0 SH27 (SUTURE) ×3 IMPLANT
SUT MON AB 5-0 PS2 18 (SUTURE) ×4 IMPLANT
SUT PDS 3-0 CT2 (SUTURE) ×4
SUT PDS AB 2-0 CT2 27 (SUTURE) IMPLANT
SUT PDS II 3-0 CT2 27 ABS (SUTURE) ×2 IMPLANT
SUT PROLENE 3 0 PS 2 (SUTURE) IMPLANT
SUT SILK 3 0 PS 1 (SUTURE) IMPLANT
SUT VIC AB 3-0 SH 27 (SUTURE)
SUT VIC AB 3-0 SH 27X BRD (SUTURE) IMPLANT
SUT VICRYL 4-0 PS2 18IN ABS (SUTURE) IMPLANT
SWAB COLLECTION DEVICE MRSA (MISCELLANEOUS) IMPLANT
SWAB CULTURE ESWAB REG 1ML (MISCELLANEOUS) IMPLANT
SYR 50ML LL SCALE MARK (SYRINGE) IMPLANT
SYR BULB IRRIGATION 50ML (SYRINGE) ×2 IMPLANT
SYR CONTROL 10ML LL (SYRINGE) ×2 IMPLANT
TOWEL GREEN STERILE FF (TOWEL DISPOSABLE) ×4 IMPLANT
TUBE CONNECTING 20X1/4 (TUBING) ×2 IMPLANT
UNDERPAD 30X30 (UNDERPADS AND DIAPERS) ×4 IMPLANT
YANKAUER SUCT BULB TIP NO VENT (SUCTIONS) ×2 IMPLANT

## 2018-03-18 NOTE — Op Note (Signed)
Op report Bilateral Exchange   DATE OF OPERATION: 03/18/2018  LOCATION: [facility]  SURGICAL DIVISION: Plastic Surgery  PREOPERATIVE DIAGNOSES:  1.History of breast cancer.  2. Acquired absence of bilateral breast.  3. Left Ruptured implant.  POSTOPERATIVE DIAGNOSES:  1. History of breast cancer.  2. Acquired absence of bilateral breast.  3. Left Ruptured implant.  PROCEDURE:  1. Bilateral exchange of breast implants.  2. Bilateral capsulotomies for implant respositioning. 3. Right breast capsulectomy  SURGEON: Lyndee Leo Sanger Erasto Sleight, DO  ASSISTANT: Ronette Deter, PA  ANESTHESIA:  General.   COMPLICATIONS: None.   IMPLANTS: Left - Mentor Smooth Round Ultra High Profile Gel 590cc. Ref #[id number].  Serial Number [id number] Right - Mentor Smooth Round Ultra High Profile Gel 590cc. Ref #[id number].  Serial Number [id number]  INDICATIONS FOR PROCEDURE:  The patient, [name], is a 42 y.o. female born on [birth year], is here for treatment after bilateral mastectomies.  She had tissue expanders placed followed by silicone implants.  She was found to have a rupture of the left implant and capsular contractures.  She now presents for exchange of her expanders for implants.  MRN: [id number]  CONSENT:  Informed consent was obtained directly from the patient. Risks, benefits and alternatives were fully discussed. Specific risks including but not limited to bleeding, infection, hematoma, seroma, scarring, pain, implant infection, implant extrusion, capsular contracture, asymmetry, wound healing problems, and need for further surgery were all discussed. The patient did have an ample opportunity to have her questions answered to her satisfaction.   DESCRIPTION OF PROCEDURE:  The patient was taken to the operating room. SCDs were placed and IV antibiotics were given. The patient's chest was prepped and draped in a sterile fashion. A time out was performed  and the implants to be used were identified.    On the right breast: One percent Lidocaine with epinephrine was used to infiltrate at the incision site. The old mastectomy scar was incsed.  The mastectomy flaps from the superior and inferior flaps were raised for a short distance over the pectoralis major muscle for several centimeters to minimize tension for the closure. The pectoralis was split inferior to the skin incision to expose and remove the tissue expander.  Inspection of the pocket showed a normal capsule but larger than needed for the new implant.  The pocket was irrigated with antibiotic solution.  A lateral capsulectomy was done and then repositioned more medially with 3-0 PDS to help hold the implant in position.  Medial capsule release was done there it was contracted.  A portion of the capsule at the medial and inferior portion of the pocket was excised.  Measurements were made and a sizer used to confirm adequate pocket size for the implant dimensions.  Hemostasis was ensured with electrocautery. New gloves were placed. The implant was soaked in antibiotic solution and then placed in the pocket and oriented appropriately. The pectoralis major muscle and capsule on the anterior surface were re-closed with a 3-0 Monocryl suture. The remaining skin was closed with 4-0 Monocryl deep dermal and 5-0 Monocryl subcuticular stitches.   On the left breast: The old mastectomy scar was incised.  The mastectomy flaps from the superior and inferior flaps were raised for a short distance over the pectoralis major muscle for several centimeters to minimize tension for the closure. The pectoralis was split inferior to the skin incision to expose and remove the tissue expander.  Inspection of the pocket showed a normal healthy capsule  and good integration of the biologic matrix. Measurements were made and a sizer utilized to confirm adequate pocket size for the implant dimensions.  Hemostasis was ensured with  the electrocautery.  New gloves were applied. The implant was soaked in antibiotic solution and placed in the pocket and oriented appropriately. The pectoralis major muscle and capsule on the anterior surface were re-closed with a 3-0 Monocryl suture. The remaining skin was closed with 4-0 Monocryl deep dermal and 5-0 Monocryl subcuticular stitches.  Dermabond was applied to the incision site. A breast binder and ABDs were placed.  The patient was allowed to wake from anesthesia and taken to the recovery room in satisfactory condition.

## 2018-03-18 NOTE — Discharge Instructions (Addendum)
INSTRUCTIONS FOR AFTER BREAST SURGERY ° ° °You are getting ready to undergo breast surgery.  You will likely have some questions about what to expect following your operation.  The following information will help you and your family understand what to expect when you are discharged from the hospital.  Following these guidelines will help ensure a smooth recovery and reduce risks of complications.   °Postoperative instructions include information on: diet, wound care, medications and physical activity. ° °AFTER SURGERY °Expect to go home after the procedure.  In some cases, you may need to spend one night in the hospital for observation. ° °DIET °Breast surgery does not require a specific diet.  However, I have to mention that the healthier you eat the better your body can start healing. It is important to increasing your protein intake.  This means limiting the foods with sugar and carbohydrates.  Focus on vegetables and some meat.  If you have any liposuction during your procedure be sure to drink water.  If your urine is bright yellow, then it is concentrated, and you need to drink more water.  As a general rule after surgery, you should have 8 ounces of water every hour while awake.  If you find you are persistently nauseated or unable to take in liquids let us know.  NO TOBACCO USE or EXPOSURE.  This will slow your healing process and increase the risk of a wound. ° °WOUND CARE °You can shower the day after surgery if you don't have a drain.  Use fragrance free soap.  Dial, Dove and Ivory are usually mild on the skin. If you have a drain clean with baby wipes until the drain is removed.  If you have steri-strips / tape directly attached to your skin leave them in place. It is OK to get these wet.  No baths, pools or hot tubs for two weeks. °We close your incision to leave the smallest and best-looking scar. No ointment or creams on your incisions until given the go ahead.  Especially not Neosporin (Too many skin  reactions with this one).  A few weeks after surgery you can use Mederma and start massaging the scar. °We ask you to wear your binder or sports bra for the first 6 weeks around the clock, including while sleeping. This provides added comfort and helps reduce the fluid accumulation at the surgery site. ° °ACTIVITY °No heavy lifting until cleared by the doctor.  This usually means no more than a half-gallon of milk.  It is OK to walk and climb stairs. In fact, moving your legs is very important to decrease your risk of a blood clot.  It will also help keep you from getting deconditioned.  Every 1 to 2 hours get up and walk for 5 minutes. This will help with a quicker recovery back to normal.  Let pain be your guide so you don't do too much.  NO, you cannot do the spring cleaning and don't plan on taking care of anyone else.  This is your time for TLC.  °You will be more comfortable if you sleep and rest with your head elevated either with a few pillows under you or in a recliner.  No stomach sleeping for a few months. ° °WORK °Everyone returns to work at different times. As a rough guide, most people take at least 1 - 2 weeks off prior to returning to work. If you need documentation for your job, bring the forms to your postoperative follow   up visit.  DRIVING Arrange for someone to bring you home from the hospital.  You may be able to drive a few days after surgery but not while taking any narcotics or valium.  BOWEL MOVEMENTS Constipation can occur after anesthesia and while taking pain medication.  It is important to stay ahead for your comfort.  We recommend taking Milk of Magnesia (2 tablespoons; twice a day) while taking the pain pills.  SEROMA This is fluid your body tried to put in the surgical site.  This is normal but if it creates tight skinny skin let us know.  It usually decreases in a few weeks.  WHEN TO CALL Call your surgeon's office if any of the following occur:  Fever 101 degrees F or  greater  Excessive bleeding or fluid from the incision site.  Pain that increases over time without aid from the medications  Redness, warmth, or pus draining from incision sites  Persistent nausea or inability to take in liquids  Severe misshapen area that underwent the operation.  Here are some resources:  1. Plastic surgery website: https://www.plasticsurgery.org/for-medical-professionals/education-and-resources/publications/breast-reconstruction-magazine 2. Breast Reconstruction Awareness Campaign:  HotelLives.co.nz 3. Plastic surgery Implant information:  https://www.plasticsurgery.org/patient-safety/breast-implant-safety       Post Anesthesia Home Care Instructions  Activity: Get plenty of rest for the remainder of the day. A responsible individual must stay with you for 24 hours following the procedure.  For the next 24 hours, DO NOT: -Drive a car -Paediatric nurse -Drink alcoholic beverages -Take any medication unless instructed by your physician -Make any legal decisions or sign important papers.  Meals: Start with liquid foods such as gelatin or soup. Progress to regular foods as tolerated. Avoid greasy, spicy, heavy foods. If nausea and/or vomiting occur, drink only clear liquids until the nausea and/or vomiting subsides. Call your physician if vomiting continues.  Special Instructions/Symptoms: Your throat may feel dry or sore from the anesthesia or the breathing tube placed in your throat during surgery. If this causes discomfort, gargle with warm salt water. The discomfort should disappear within 24 hours.  If you had a scopolamine patch placed behind your ear for the management of post- operative nausea and/or vomiting:  1. The medication in the patch is effective for 72 hours, after which it should be removed.  Wrap patch in a tissue and discard in the trash. Wash hands thoroughly with soap and water. 2. You may remove the patch earlier than  72 hours if you experience unpleasant side effects which may include dry mouth, dizziness or visual disturbances. 3. Avoid touching the patch. Wash your hands with soap and water after contact with the patch.

## 2018-03-18 NOTE — Anesthesia Postprocedure Evaluation (Signed)
Anesthesia Post Note  Patient: KATELIN KUTSCH  Procedure(s) Performed: REMOVAL BREAST IMPLANTS (Bilateral Breast) CAPSULECTOMY (Bilateral Breast) PLACEMENT OF BREAST IMPLANTS (Bilateral Breast)     Patient location during evaluation: PACU Anesthesia Type: General Level of consciousness: awake and alert Pain management: pain level controlled Vital Signs Assessment: post-procedure vital signs reviewed and stable Respiratory status: spontaneous breathing, nonlabored ventilation, respiratory function stable and patient connected to nasal cannula oxygen Cardiovascular status: blood pressure returned to baseline and stable Postop Assessment: no apparent nausea or vomiting Anesthetic complications: no    Last Vitals:  Vitals:   03/18/18 1118 03/18/18 1130  BP:  131/79  Pulse: 86 83  Resp: 20 18  Temp:  36.8 C  SpO2: 99% 97%    Last Pain:  Vitals:   03/18/18 1130  TempSrc:   PainSc: 0-No pain                 Bryana Froemming DAVID

## 2018-03-18 NOTE — Interval H&P Note (Signed)
History and Physical Interval Note:  03/18/2018 7:21 AM  [name]  has presented today for surgery, with the diagnosis of Acquired absence of breast and absent nipple, bilateral; Malignant neoplasm of overlapping sites of left breast in female, estrogen receptor positive; Capsular contracture of breast implant  The various methods of treatment have been discussed with the patient and family. After consideration of risks, benefits and other options for treatment, the patient has consented to  Procedure(s): REMOVAL BREAST IMPLANTS (Bilateral) CAPSULECTOMY (Bilateral) PLACEMENT OF BREAST IMPLANTS (Bilateral) as a surgical intervention .  The patient's history has been reviewed, patient examined, no change in status, stable for surgery.  I have reviewed the patient's chart and labs.  Questions were answered to the patient's satisfaction.     Loel Lofty Dillingham

## 2018-03-18 NOTE — Anesthesia Preprocedure Evaluation (Signed)
Anesthesia Evaluation  Patient identified by MRN, date of birth, ID band Patient awake    Reviewed: Allergy & Precautions, NPO status , Patient's Chart, lab work & pertinent test results  Airway Mallampati: II  TM Distance: >3 FB Neck ROM: Full    Dental   Pulmonary    Pulmonary exam normal        Cardiovascular Normal cardiovascular exam     Neuro/Psych Anxiety Depression    GI/Hepatic GERD  Medicated and Controlled,  Endo/Other    Renal/GU      Musculoskeletal   Abdominal   Peds  Hematology   Anesthesia Other Findings   Reproductive/Obstetrics                             Anesthesia Physical Anesthesia Plan  ASA: II  Anesthesia Plan: General   Post-op Pain Management:    Induction: Intravenous  PONV Risk Score and Plan: 3 and Ondansetron, Midazolam and Treatment may vary due to age or medical condition  Airway Management Planned: LMA  Additional Equipment:   Intra-op Plan:   Post-operative Plan: Extubation in OR  Informed Consent: I have reviewed the patients History and Physical, chart, labs and discussed the procedure including the risks, benefits and alternatives for the proposed anesthesia with the patient or authorized representative who has indicated his/her understanding and acceptance.     Plan Discussed with: CRNA and Surgeon  Anesthesia Plan Comments:         Anesthesia Quick Evaluation

## 2018-03-18 NOTE — Transfer of Care (Signed)
Immediate Anesthesia Transfer of Care Note  Patient: [name]  Procedure(s) Performed: REMOVAL BREAST IMPLANTS (Bilateral Breast) CAPSULECTOMY (Bilateral Breast) PLACEMENT OF BREAST IMPLANTS (Bilateral Breast)  Patient Location: PACU  Anesthesia Type:General  Level of Consciousness: awake, alert , oriented and drowsy  Airway & Oxygen Therapy: Patient Spontanous Breathing and Patient connected to face mask oxygen  Post-op Assessment: Report given to RN and Post -op Vital signs reviewed and stable  Post vital signs: Reviewed and stable  Last Vitals:  Vitals Value Taken Time  BP    Temp    Pulse 97 03/18/2018  9:40 AM  Resp 20 03/18/2018  9:40 AM  SpO2 99 % 03/18/2018  9:40 AM  Vitals shown include unvalidated device data.  Last Pain:  Vitals:   03/18/18 0651  TempSrc: Oral  PainSc: 0-No pain         Complications: No apparent anesthesia complications

## 2018-03-18 NOTE — Anesthesia Procedure Notes (Signed)
Procedure Name: LMA Insertion Date/Time: 03/18/2018 7:39 AM Performed by: Willa Frater, CRNA Pre-anesthesia Checklist: Patient identified, Emergency Drugs available, Suction available and Patient being monitored Patient Re-evaluated:Patient Re-evaluated prior to induction Oxygen Delivery Method: Circle system utilized Preoxygenation: Pre-oxygenation with 100% oxygen Induction Type: IV induction Ventilation: Mask ventilation without difficulty LMA: LMA inserted LMA Size: 4.0 Number of attempts: 1 Airway Equipment and Method: Bite block Placement Confirmation: positive ETCO2 Tube secured with: Tape Dental Injury: Teeth and Oropharynx as per pre-operative assessment

## 2018-03-19 ENCOUNTER — Encounter (HOSPITAL_BASED_OUTPATIENT_CLINIC_OR_DEPARTMENT_OTHER): Payer: Self-pay | Admitting: Plastic Surgery

## 2018-03-22 ENCOUNTER — Other Ambulatory Visit: Payer: Self-pay | Admitting: Physician Assistant

## 2018-03-22 MED ORDER — DOXYCYCLINE HYCLATE 50 MG PO CAPS: 50.0000 mg | ORAL_CAPSULE | Freq: Two times a day (BID) | ORAL | 0 refills | Status: DC

## 2018-03-23 ENCOUNTER — Ambulatory Visit (INDEPENDENT_AMBULATORY_CARE_PROVIDER_SITE_OTHER): Payer: BLUE CROSS/BLUE SHIELD | Admitting: Plastic Surgery

## 2018-03-23 ENCOUNTER — Encounter: Payer: Self-pay | Admitting: Plastic Surgery

## 2018-03-23 VITALS — Resp 12 | Ht 67.0 in

## 2018-03-23 DIAGNOSIS — Z9013 Acquired absence of bilateral breasts and nipples: Secondary | ICD-10-CM

## 2018-03-23 DIAGNOSIS — C50812 Malignant neoplasm of overlapping sites of left female breast: Secondary | ICD-10-CM

## 2018-03-23 DIAGNOSIS — C773 Secondary and unspecified malignant neoplasm of axilla and upper limb lymph nodes: Secondary | ICD-10-CM

## 2018-03-23 DIAGNOSIS — Z170 Estrogen receptor positive status [ER+]: Secondary | ICD-10-CM

## 2018-03-23 DIAGNOSIS — C50912 Malignant neoplasm of unspecified site of left female breast: Secondary | ICD-10-CM

## 2018-03-23 DIAGNOSIS — T8544XA Capsular contracture of breast implant, initial encounter: Secondary | ICD-10-CM

## 2018-03-23 NOTE — Progress Notes (Signed)
   Subjective:    Patient ID: [name], female    DOB: [birth year], 42 y.o.   MRN: [id number]  The patient is a 42 year old female here for follow-up on her tertiary breast reconstruction from last week.  She underwent removal of bilateral implants and placement of new implants with capsule release.  She had a MRI showing a leak of the left implant.  She is doing well at home, no sign of infection, no signs of seroma or hematoma.  She is very pleased with the results.  She was also pleased with the size and understood that we needed to go a little bit smaller.  The incisions are healing well and she was given instruction on what to expect if the radiation presents a problem for healing.  She is 5 feet 7 inches tall.   Review of Systems  Constitutional: Negative.   HENT: Negative.   Eyes: Negative.   Respiratory: Negative.   Gastrointestinal: Negative.   Genitourinary: Negative.   Musculoskeletal: Negative.   Skin: Negative.        Objective:   Physical Exam  Constitutional: She appears well-developed and well-nourished.  HENT:  Head: Normocephalic and atraumatic.  Cardiovascular: Normal rate.  Pulmonary/Chest: Effort normal.  Neurological: She is alert.  Psychiatric: She has a normal mood and affect. Her behavior is normal. Judgment and thought content normal.       Assessment & Plan:  Breast cancer metastasized to axillary lymph node, left (HCC)  Malignant neoplasm of overlapping sites of left breast in female, estrogen receptor positive ([place])  Acquired absence of breast and absent nipple, bilateral  Capsular contracture of breast implant, initial encounter  I reviewed with the patient and her husband what to do if her left breast incision opened.  She will need to put a sports bra on and call us.  If that happens she will require a latissimus muscle flap.  Hopefully she will need that.  No heavy lifting.  Range of motion is fine.  I would like to see her back in 3  weeks.

## 2018-04-23 ENCOUNTER — Encounter: Payer: Self-pay | Admitting: Plastic Surgery

## 2018-04-23 ENCOUNTER — Ambulatory Visit (INDEPENDENT_AMBULATORY_CARE_PROVIDER_SITE_OTHER): Payer: BLUE CROSS/BLUE SHIELD | Admitting: Plastic Surgery

## 2018-04-23 VITALS — BP 128/75 | HR 80 | Ht 67.0 in | Wt 213.0 lb

## 2018-04-23 DIAGNOSIS — Z9013 Acquired absence of bilateral breasts and nipples: Secondary | ICD-10-CM

## 2018-04-23 DIAGNOSIS — C50912 Malignant neoplasm of unspecified site of left female breast: Secondary | ICD-10-CM

## 2018-04-23 DIAGNOSIS — T8544XA Capsular contracture of breast implant, initial encounter: Secondary | ICD-10-CM

## 2018-04-23 DIAGNOSIS — C773 Secondary and unspecified malignant neoplasm of axilla and upper limb lymph nodes: Secondary | ICD-10-CM

## 2018-04-23 NOTE — Progress Notes (Signed)
   Subjective:    Patient ID: [name], female    DOB: [birth year], 42 y.o.   MRN: [id number]  [name] is a 42 yrs old bf here for follow-up on her breast reconstruction /implant exchange.  We placed Mentor ultrahigh profile silicone implants 209 cc bilaterally.  She is very pleased with her size and pleased that she went smaller.  She still has some firmness on the left.  She denies any pain on either side.  There is no sign of redness, swelling, or seroma.  No sign of infection.  Skin is well-healed.  She is asking about this and physical therapy.   Review of Systems  Constitutional: Negative.   HENT: Negative.   Eyes: Negative.   Respiratory: Negative.   Cardiovascular: Negative.   Gastrointestinal: Negative.   Endocrine: Negative.   Genitourinary: Negative.   Musculoskeletal: Negative.  Negative for back pain.  Skin: Negative.  Negative for color change and wound.  Hematological: Negative.        Objective:   Physical Exam Vitals signs and nursing note reviewed.  Constitutional:      Appearance: Normal appearance.  HENT:     Head: Normocephalic and atraumatic.  Cardiovascular:     Rate and Rhythm: Normal rate.  Skin:    General: Skin is warm.  Neurological:     Mental Status: She is alert.  Psychiatric:        Mood and Affect: Mood normal.        Thought Content: Thought content normal.        Judgment: Judgment normal.        Assessment & Plan:  Breast cancer metastasized to axillary lymph node, left (HCC)  Acquired absence of breast and absent nipple, bilateral  Capsular contracture of breast implant, initial encounter  Will arrange for physical therapy for range of motion and advice on exercising as well as massage to the breast area to prevent recurrence of the capsular contracture.  Would like to see the patient back in a year.  She knows to come back sooner if needed or has any questions.

## 2018-06-01 ENCOUNTER — Other Ambulatory Visit: Payer: Self-pay

## 2018-06-01 DIAGNOSIS — Z170 Estrogen receptor positive status [ER+]: Secondary | ICD-10-CM

## 2018-06-01 DIAGNOSIS — C50812 Malignant neoplasm of overlapping sites of left female breast: Secondary | ICD-10-CM

## 2018-06-01 MED ORDER — TAMOXIFEN CITRATE 20 MG PO TABS: 20.0000 mg | ORAL_TABLET | Freq: Every day | ORAL | 3 refills | Status: DC

## 2018-06-07 ENCOUNTER — Telehealth: Payer: Self-pay | Admitting: *Deleted

## 2018-06-07 NOTE — Telephone Encounter (Signed)
Received call from patient stating that she had a (L) breast implant done.  She feels the implant itself feels wrinkled around the nipple part on the side.  Asked the patient when is her next follow-up visit.  She stated not until next year.  Informed the patient that neither Dr. Marla Roe or Asencion Partridge was in the office right now.  But I will speak with one of them and get back with her.  I asked the patient if I was not able to speak with them today would it be okay to give her a call back on tomorrow.  The patient verified understanding and agreed.  I was able to speak with Asencion Partridge and informed her of the message from the patient.  Asencion Partridge stated to have the patient in for a follow-up this week or next Tuesday.  Called and speak with the patient and informed her of what Asencion Partridge said.  Informed the patient that she will receive a call back regarding an appointment.  Patient verified understanding and agreed.//AB/CMA

## 2018-06-29 ENCOUNTER — Encounter: Payer: Self-pay | Admitting: Plastic Surgery

## 2018-06-29 ENCOUNTER — Ambulatory Visit (INDEPENDENT_AMBULATORY_CARE_PROVIDER_SITE_OTHER): Payer: BLUE CROSS/BLUE SHIELD | Admitting: Plastic Surgery

## 2018-06-29 VITALS — BP 111/73 | HR 86 | Temp 98.4°F | Ht 67.0 in | Wt 209.4 lb

## 2018-06-29 DIAGNOSIS — Z9882 Breast implant status: Secondary | ICD-10-CM

## 2018-06-29 DIAGNOSIS — Z9889 Other specified postprocedural states: Secondary | ICD-10-CM

## 2018-06-29 DIAGNOSIS — Z9013 Acquired absence of bilateral breasts and nipples: Secondary | ICD-10-CM

## 2018-06-29 NOTE — Progress Notes (Addendum)
   Subjective:    Patient ID: MAEDELL HEDGER, female    DOB: August 20, 1975, 43 y.o.   MRN: [id number]  Gearlene is a 43 yrs old bf here for evaluation of her left breast.  She underwent breast reconstruction in the past and then recently implant exchange of the left breast we placed Mentor Ultra High profile silicone implant 194 cc bilaterally.  She is very pleased with the size and shape.  She was a little bit concerned about wrinkling that she noted on the left lateral breast area at the place of her incision this does appear to be normal rib rippling and no leak of the implant.  It is not tender to touch.  No sign of swelling or seroma.  Her incision is very well-healed.  She denies any trauma to the area.  She recently got over the flu.   Review of Systems  Constitutional: Negative.   HENT: Negative.   Eyes: Negative.   Respiratory: Negative.   Cardiovascular: Negative.   Gastrointestinal: Negative.   Endocrine: Negative.   Genitourinary: Negative.   Musculoskeletal: Negative.   Hematological: Negative.        Objective:   Physical Exam Vitals signs and nursing note reviewed.  Constitutional:      Appearance: Normal appearance.  HENT:     Head: Normocephalic and atraumatic.     Mouth/Throat:     Mouth: Mucous membranes are moist.  Eyes:     Extraocular Movements: Extraocular movements intact.  Cardiovascular:     Rate and Rhythm: Normal rate.  Pulmonary:     Effort: Pulmonary effort is normal.  Skin:    General: Skin is warm.  Neurological:     Mental Status: She is alert.  Psychiatric:        Mood and Affect: Mood normal.        Thought Content: Thought content normal.        Judgment: Judgment normal.        Assessment & Plan:  Acquired absence of breast and absent nipple, bilateral  History of mastectomy, bilateral  History of reconstruction of both breasts Activity as able.  1 year follow-up.  Continue self exams.  Rippling appears to be normal at this  time.  Call with any increased or significant change.

## 2018-10-07 ENCOUNTER — Encounter: Payer: Self-pay | Admitting: Adult Health

## 2018-11-04 ENCOUNTER — Inpatient Hospital Stay: Payer: Medicare Other | Admitting: Oncology

## 2018-11-04 ENCOUNTER — Other Ambulatory Visit: Payer: BLUE CROSS/BLUE SHIELD

## 2019-01-25 NOTE — Progress Notes (Signed)
[name]  [phone] [phone]      ID: KEAGAN ANTHIS OB: 04-Mar-1946  MR#: [id number]  QTM#:[id number]  Patient Care Team: Maurice Small, MD as PCP - General (Family Medicine) Cloud Graham, Virgie Dad, MD as Consulting Physician (Oncology) Alphonsa Overall, MD as Consulting Physician (General Surgery) OTHER MD: Annette Stable [phone])  CHIEF COMPLAINT:  Locally recurrent estrogen receptor positive breast cancer  CURRENT TREATMENT: Tamoxifen  BREAST CANCER HISTORY:  From the original intake note:  [name] had bilateral diagnostic mammography [facility] in [place] 11/30/2009 showing a 1.5 cm mass at the 9:00 position of the left breast with some satellites. A biopsy of the mass in question [facility] showed ([id number]) and invasive lobular carcinoma, which was estrogen and progesterone receptor both 100% positive, both with strong staining intensity, but HER-2 negative at 1+. Bilateral breast MRI 12/13/2009 in [place] showed in the left breast an irregularly marginated mass measuring 3.2 cm. There were 4 or 5 separate satellite lesions laterally and superior to the primary.  Accordingly, after appropriate discussion, the patient underwent bilateral mastectomies with left sentinel lymph node sampling 01/02/2010. The right breast was benign. The left breast showed a 2.3 cm invasive lobular carcinoma, grade 3, with a total of 6 sentinel lymph nodes removed, all negative, although 2 showed isolated tumor cells by immunostaining. Margins were negative.  An Oncotype BX was sent, with a recurrence score of 22, predicting a risk of distant recurrence within 10 years with 14% of the patients only systemic treatment was tamoxifen. The patient was treated with CMF chemotherapy between 02/22/2010 and 07/05/2010, receiving 7 cycles at which point the patient refused further chemotherapy though there have been no major  toxicities at according to the oncology note from Dr. Brent General. The patient was then started on tamoxifen 08/09/2010. By her cancer took approximately 12-15 months then stopped because of hot flashes and insomnia problems.  More recently, the patient noted a change in her left axilla andunderwent bilateral breast MRI January to 2015 at [place]. The patient has bilateral submuscular silicone implants in place. The breast were unremarkable, but there were several lymph nodes with cortical thickening in the left axilla including a dominant 1 measuring 1.2 cm in the short axis. Ultrasonography of this area identified the lymph node in question and the patient underwent biopsy of the left axillary lymph node This showed ([id number]) near-total replacement of the lymph node by metastatic carcinoma. This was 96% estrogen receptor positive, and 84% progesterone receptor positive, both with strong staining intensity. The MIB-1 was 20%. HER-2 determination is pending.  The patient's subsequent history is as detailed below   INTERVAL HISTORY: [name] returns today for follow-up and treatment of her estrogen receptor positive breast cancer. She was last seen here on 02/10/2018.   She continues on tamoxifen.  She tolerates this with absolutely no side effects including no hot flashes.  She might have a hot flash once a month she says.  There has been no vaginal wetness.  Since her last visit here, she underwent a left breast ultrasound on 02/18/2018 showing: On physical exam, no mass is palpated in the upper inner left breast. Targeted ultrasound is performed, showing a small area cystic change and mild hyperechogenicity in the left breast at 10 o'clock, 12 cm the nipple, corresponding in overall size and location to the MRI abnormality. There was no other sonographic abnormality on examination of the entire upper inner left breast. No solid  masses.  She is status post bilateral mastectomies and does not  require annual mammography.   REVIEW OF SYSTEMS: Stanislawa denies unusual headaches, visual changes, nausea, vomiting, stiff neck, dizziness, or gait imbalance. There has been no cough, phlegm production, or pleurisy, no chest pain or pressure, and no change in bowel or bladder habits. The patient denies fever, rash, bleeding, unexplained fatigue or unexplained weight loss.  She is trying to lose weight, her goal being about 185 pounds.  She says she does not feel as well over 200.  She is not exercising as much as she or I would like but she is taking some walks.  A detailed review of systems was otherwise entirely negative.    PAST MEDICAL HISTORY: Past Medical History:  Diagnosis Date  . Back muscle spasm    occasional tx with skelaxin  . Cancer ([place])    breast ca  . GERD (gastroesophageal reflux disease)   . Malignant neoplasm of breast (female), unspecified site   . Radiation 10/24/13-12/09/13   Left chestwall/supraclav./scar    PAST SURGICAL HISTORY: Past Surgical History:  Procedure Laterality Date  . AXILLARY LYMPH NODE DISSECTION Left 06/16/2013   Procedure: LEFT AXILLARY NODE DISSECTION;  Surgeon: Shann Medal, MD;  Location: [facility];  Service: General;  Laterality: Left;  . BILATERAL TOTAL MASTECTOMY WITH AXILLARY LYMPH NODE DISSECTION    . BREAST IMPLANT REMOVAL Bilateral 03/18/2018   Procedure: REMOVAL BREAST IMPLANTS;  Surgeon: Wallace Going, DO;  Location: [facility];  Service: Plastics;  Laterality: Bilateral;  . BREAST SURGERY    . CAPSULECTOMY Bilateral 03/18/2018   Procedure: CAPSULECTOMY;  Surgeon: Wallace Going, DO;  Location: [facility];  Service: Plastics;  Laterality: Bilateral;  . LAPAROSCOPIC BILATERAL SALPINGO OOPHERECTOMY Bilateral 01/25/2014   Procedure: LAPAROSCOPIC BILATERAL SALPINGO OOPHORECTOMY;  Surgeon: Eldred Manges, MD;  Location: [facility];  Service: Gynecology;  Laterality: Bilateral;  . LEEP    .  PLACEMENT OF BREAST IMPLANTS Bilateral 03/18/2018   Procedure: PLACEMENT OF BREAST IMPLANTS;  Surgeon: Wallace Going, DO;  Location: [facility];  Service: Plastics;  Laterality: Bilateral;  . PORT-A-CATH REMOVAL N/A 01/25/2014   Procedure: REMOVAL PORT-A-CATH;  Surgeon: Alphonsa Overall, MD;  Location: [facility];  Service: General;  Laterality: N/A;  . PORTACATH PLACEMENT N/A 06/16/2013   Procedure: POWER PORT PLACEMENT;  Surgeon: Shann Medal, MD;  Location: [facility];  Service: General;  Laterality: N/A;  . right leg surgery     broken femur - has rod in leg  . TONSILLECTOMY    . WISDOM TOOTH EXTRACTION      FAMILY HISTORY Family History  Problem Relation Age of Onset  . Other Mother 46       glioblastoma; deceased 40  . Cancer Mother   . Other Father 37       glioblastoma; deceased 55  . Cancer Father   . Hypertension Other    according to the patient both her parents died from a primary brain cancers, namely we have the stomas, her father at 70, her mother at 51. The patient had one brother and one sister. There is no history of breast or ovarian cancer in the family  GYNECOLOGIC HISTORY:   (Reviewed 10/10/2013) Menarche age 62. The patient has never carried a child to term. She was still having regular periods at the time of her recent diagnosis. LMP 07/18/2013 as of 09/08/2013.  The patient took oral contraceptives between the  ages of 28 and 56 with no complications. She stopped having periods with her chemotherapy in 2015.   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 She currently works as a Research scientist (physical sciences). She shares a home with her significant other, Burnice Logan, who works at [employer] and also in [place]: Not in [place]:  (Reviewed 10/10/2013) Social History   Tobacco Use  .  Smoking status: Never Smoker  . Smokeless tobacco: Never Used  Substance Use Topics  . Alcohol use: Yes    Comment: socially wine  . Drug use: No     Colonoscopy: Never  PAP: December 2014  Bone density: Never  Lipid panel: Not on file   Allergies  Allergen Reactions  . Sulfa Antibiotics Hives and Itching  . Gadolinium Derivatives Hives    After gado injection, pt had a few small hives on left breast area. Treated with benadryl by dr Janeece Fitting   . Petrolatum Hives and Rash    Current Outpatient Medications  Medication Sig Dispense Refill  . tamoxifen (NOLVADEX) 20 MG tablet Take 1 tablet (20 mg total) by mouth daily. 90 tablet 3   No current facility-administered medications for this visit.     OBJECTIVE: Young African-American woman in no acute distress  Vitals:   01/26/19 1004  BP: 109/88  Pulse: 75  Resp: 18  Temp: 98.3 F (36.8 C)  SpO2: 100%   Wt Readings from Last 3 Encounters:  01/26/19 207 lb 8 oz (94.1 kg)  06/29/18 209 lb 6.4 oz (95 kg)  04/23/18 213 lb (96.6 kg)   Body mass index is 32.5 kg/m.    ECOG FS:1 - Symptomatic but completely ambulatory  Ocular: Sclerae unicteric, pupils round and equal Ear-nose-throat: Wearing a mask Lymphatic: No cervical or supraclavicular adenopathy Lungs no rales or rhonchi Heart regular rate and rhythm Abd soft, nontender, positive bowel sounds MSK no focal spinal tenderness, no joint edema Neuro: non-focal, well-oriented, appropriate affect Breasts: Status post bilateral mastectomies with bilateral saline implant reconstruction.  There is no evidence of local recurrence.  Both axillae are benign.    LAB RESULTS: Lab Results  Component Value Date   WBC 6.0 01/26/2019   NEUTROABS 2.8 01/26/2019   HGB 10.1 (L) 01/26/2019   HCT 33.3 (L) 01/26/2019   MCV 83.3 01/26/2019   PLT 308 01/26/2019      Chemistry      Component Value Date/Time   NA 140 01/26/2019 0917   NA 141 10/30/2016 0813   K 4.6  01/26/2019 0917   K 4.0 10/30/2016 0813   CL 107 01/26/2019 0917   CO2 25 01/26/2019 0917   CO2 26 10/30/2016 0813   BUN 12 01/26/2019 0917   BUN 15.2 10/30/2016 0813   CREATININE 0.82 01/26/2019 0917   CREATININE 0.8 10/30/2016 0813      Component Value Date/Time   CALCIUM 10.0 01/26/2019 0917   CALCIUM 10.0 10/30/2016 0813   ALKPHOS 78 01/26/2019 0917   ALKPHOS 59 10/30/2016 0813   AST 11 (L) 01/26/2019 0917   AST 17 10/30/2016 0813   ALT 8 01/26/2019 0917   ALT 11 10/30/2016 0813   BILITOT <0.2 (L) 01/26/2019 0917   BILITOT 0.26 10/30/2016 0813      STUDIES: No results found.  ASSESSMENT: 43 y.o. BRCA negative [place] woman  (  1) status post bilateral mastectomies with left axillary lymph node sampling [6 nodes removed] 01/02/2010 for a pT2 pN0(i+), stage IIA invasive lobular breast cancer, grade 3, estrogen and progesterone receptor positive, HER-2 not amplified; right breast was benign  (2) Oncotype DX score of 22 predicted a 14% risk of distant recurrence within 10 years if the patient's only systemic treatment is tamoxifen for 5 years  (3) status post CMF(cyclophosphamide, fluorouracil, methotrexate) x7 given between October of 2011 and March of 2012 (7 of 8 planned treatments completed)  (4) tamoxifen started April 2012, discontinued approximately July of 2013 (about 15 months) because of problems with hot flashes and insomnia  (5) pathologically documented left axillary recurrence 05/05/2013, the tumor being again estrogen and progesterone receptor positive, with HER-2 not amplified, and MIB-1 of 15%. Staging CT/PET 05/27/2013 show left axillary and supraclavicular recurrence, no distant disease  (6) status post completion left axillary lymph node dissection 06/16/2013 showing a total of 10/15 lymph nodes involved by tumor, with evidence of extracapsular extension (TX N3 = stage IIIC)  (7) treated with carboplatin/ docetaxel, first dose on 07/11/2013, repeated  every 21 days x 4 with Neulasta support on day 2.  Final dose was given on 09/12/2013, with chemotherapy discontinued after 4 cycles due to continuing low counts.  (8) radiation completed 12/09/2013: Left chest wall / 50.4 Gray @ 1.8 Gray per fraction x 28 fractions Left Supraclavicular fossa and PAB/ 45 Gray _0 .8 Gray per fraction x 25 fractions Left scar / 10 Gray at [employer] per fraction x 5 fractions   (9) bilateral salpingo-oophorectomy on 01/25/14  (10)   lymphedema and limited range of motion in the left upper extremity secondary to left axillary lymph node dissection, being treated through the lymphedema clinic  - improved   (13) sequencing and deletion/duplication analysis of 17 genes performed September 2015 at [facility] showed no deleterious mutations in  ATM, BARD1, BRCA1, BRCA2, BRIP1, CDH1, CHEK2, MRE11A, MUTYH, NBN, NF1, PALB2, PTEN, RAD50, RAD51C, RAD51D, and TP53.  (14) tamoxifen re-started 02/27/2014  (15) bilateral breast implant exchange with capsulotomies 03/18/2018 following left implant rupture and bilateral capsular contractures. Left - Mentor Smooth Round Ultra High Profile Gel 590cc. Ref #[id number].  Serial Number [id number] Right - Mentor Smooth Round Ultra High Profile Gel 590cc. Ref #[id number].  Serial Number [id number]   PLAN: Syna is now 5-1/2 years out from definitive surgery for her breast cancer with no evidence of disease recurrence.  This is very favorable.  She is tolerating tamoxifen well and the plan is to continue that for a total of 10 years.  She tells me that when she had her reconstruction revision last year her insurance refused to pay for any part of it including the work-up and that she has a $28,000 debt that she is trying to pay.  I am going to see if there is anything that our billing people can do to assist her with that.  From a breast cancer point of view I am delighted at how well she is doing.  She does have chronic anemia, which  is of unclear etiology and I am going to set her up for some anemia labs in 6 months and then labs and a visit in 12 months.  We did discuss diet issues in detail today  She knows to call for any other issue that may develop before the next visit.  Chelby Salata, Virgie Dad, MD  01/26/19 10:24 AM Medical Oncology and Hematology [facility] [place], [place] [address] Tel. [phone]    Fax. [phone]  I, Jacqualyn Posey am acting as a Education administrator for Chauncey Cruel, MD.   I, Lurline Del MD, have reviewed the above documentation for accuracy and completeness, and I agree with the above.

## 2019-01-26 ENCOUNTER — Inpatient Hospital Stay: Payer: BC Managed Care – PPO | Attending: Oncology

## 2019-01-26 ENCOUNTER — Other Ambulatory Visit: Payer: Self-pay

## 2019-01-26 ENCOUNTER — Inpatient Hospital Stay (HOSPITAL_BASED_OUTPATIENT_CLINIC_OR_DEPARTMENT_OTHER): Payer: BC Managed Care – PPO | Admitting: Oncology

## 2019-01-26 VITALS — BP 109/88 | HR 75 | Temp 98.3°F | Resp 18 | Ht 67.0 in | Wt 207.5 lb

## 2019-01-26 DIAGNOSIS — C773 Secondary and unspecified malignant neoplasm of axilla and upper limb lymph nodes: Secondary | ICD-10-CM

## 2019-01-26 DIAGNOSIS — Z170 Estrogen receptor positive status [ER+]: Secondary | ICD-10-CM

## 2019-01-26 DIAGNOSIS — Z79810 Long term (current) use of selective estrogen receptor modulators (SERMs): Secondary | ICD-10-CM | POA: Diagnosis not present

## 2019-01-26 DIAGNOSIS — C50912 Malignant neoplasm of unspecified site of left female breast: Secondary | ICD-10-CM | POA: Diagnosis not present

## 2019-01-26 DIAGNOSIS — C50812 Malignant neoplasm of overlapping sites of left female breast: Secondary | ICD-10-CM

## 2019-01-26 LAB — CBC WITH DIFFERENTIAL/PLATELET
Abs Immature Granulocytes: 0.02 10*3/uL (ref 0.00–0.07)
Basophils Absolute: 0 10*3/uL (ref 0.0–0.1)
Basophils Relative: 1 %
Eosinophils Absolute: 0.3 10*3/uL (ref 0.0–0.5)
Eosinophils Relative: 5 %
HCT: 33.3 % — ABNORMAL LOW (ref 36.0–46.0)
Hemoglobin: 10.1 g/dL — ABNORMAL LOW (ref 12.0–15.0)
Immature Granulocytes: 0 %
Lymphocytes Relative: 43 %
Lymphs Abs: 2.6 10*3/uL (ref 0.7–4.0)
MCH: 25.3 pg — ABNORMAL LOW (ref 26.0–34.0)
MCHC: 30.3 g/dL (ref 30.0–36.0)
MCV: 83.3 fL (ref 80.0–100.0)
Monocytes Absolute: 0.3 10*3/uL (ref 0.1–1.0)
Monocytes Relative: 5 %
Neutro Abs: 2.8 10*3/uL (ref 1.7–7.7)
Neutrophils Relative %: 46 %
Platelets: 308 10*3/uL (ref 150–400)
RBC: 4 MIL/uL (ref 3.87–5.11)
RDW: 15.9 % — ABNORMAL HIGH (ref 11.5–15.5)
WBC: 6 10*3/uL (ref 4.0–10.5)
nRBC: 0 % (ref 0.0–0.2)

## 2019-01-26 LAB — COMPREHENSIVE METABOLIC PANEL
ALT: 8 U/L (ref 0–44)
AST: 11 U/L — ABNORMAL LOW (ref 15–41)
Albumin: 3.2 g/dL — ABNORMAL LOW (ref 3.5–5.0)
Alkaline Phosphatase: 78 U/L (ref 38–126)
Anion gap: 8 (ref 5–15)
BUN: 12 mg/dL (ref 6–20)
CO2: 25 mmol/L (ref 22–32)
Calcium: 10 mg/dL (ref 8.9–10.3)
Chloride: 107 mmol/L (ref 98–111)
Creatinine, Ser: 0.82 mg/dL (ref 0.44–1.00)
GFR calc Af Amer: >60 mL/min (ref >60)
GFR calc non Af Amer: >60 mL/min (ref >60)
Glucose, Bld: 110 mg/dL — ABNORMAL HIGH (ref 70–99)
Potassium: 4.6 mmol/L (ref 3.5–5.1)
Sodium: 140 mmol/L (ref 135–145)
Total Bilirubin: <0.2 mg/dL — ABNORMAL LOW (ref 0.3–1.2)
Total Protein: 7.6 g/dL (ref 6.5–8.1)

## 2019-01-26 MED ORDER — TAMOXIFEN CITRATE 20 MG PO TABS: 20.0000 mg | ORAL_TABLET | Freq: Every day | ORAL | 3 refills | Status: DC

## 2019-01-26 NOTE — Addendum Note (Signed)
Addended by: Chauncey Cruel on: 01/26/2019 10:33 AM   Modules accepted: Orders

## 2019-01-27 ENCOUNTER — Telehealth: Payer: Self-pay | Admitting: Oncology

## 2019-01-27 NOTE — Telephone Encounter (Signed)
I talk with patient regarding schedule  

## 2019-05-11 DIAGNOSIS — I8393 Asymptomatic varicose veins of bilateral lower extremities: Secondary | ICD-10-CM | POA: Diagnosis not present

## 2019-05-11 DIAGNOSIS — K5900 Constipation, unspecified: Secondary | ICD-10-CM | POA: Diagnosis not present

## 2019-05-16 DIAGNOSIS — Z3040 Encounter for surveillance of contraceptives, unspecified: Secondary | ICD-10-CM | POA: Diagnosis not present

## 2019-05-16 DIAGNOSIS — C50919 Malignant neoplasm of unspecified site of unspecified female breast: Secondary | ICD-10-CM | POA: Diagnosis not present

## 2019-05-16 DIAGNOSIS — Z6830 Body mass index (BMI) 30.0-30.9, adult: Secondary | ICD-10-CM | POA: Diagnosis not present

## 2019-05-16 DIAGNOSIS — Z01419 Encounter for gynecological examination (general) (routine) without abnormal findings: Secondary | ICD-10-CM | POA: Diagnosis not present

## 2019-07-01 ENCOUNTER — Ambulatory Visit: Payer: BLUE CROSS/BLUE SHIELD | Admitting: Plastic Surgery

## 2019-07-25 ENCOUNTER — Other Ambulatory Visit: Payer: Self-pay | Admitting: *Deleted

## 2019-07-25 ENCOUNTER — Other Ambulatory Visit: Payer: Self-pay

## 2019-07-25 ENCOUNTER — Inpatient Hospital Stay: Payer: BC Managed Care – PPO | Attending: Oncology

## 2019-07-25 DIAGNOSIS — D649 Anemia, unspecified: Secondary | ICD-10-CM

## 2019-07-25 DIAGNOSIS — C50812 Malignant neoplasm of overlapping sites of left female breast: Secondary | ICD-10-CM | POA: Insufficient documentation

## 2019-07-25 DIAGNOSIS — Z170 Estrogen receptor positive status [ER+]: Secondary | ICD-10-CM

## 2019-07-25 DIAGNOSIS — C50912 Malignant neoplasm of unspecified site of left female breast: Secondary | ICD-10-CM

## 2019-07-25 DIAGNOSIS — C773 Secondary and unspecified malignant neoplasm of axilla and upper limb lymph nodes: Secondary | ICD-10-CM

## 2019-07-25 LAB — RETICULOCYTES
Immature Retic Fract: 36 % — ABNORMAL HIGH (ref 2.3–15.9)
RBC.: 3.64 MIL/uL — ABNORMAL LOW (ref 3.87–5.11)
Retic Count, Absolute: 40 10*3/uL (ref 19.0–186.0)
Retic Ct Pct: 1.1 % (ref 0.4–3.1)

## 2019-07-25 LAB — CBC WITH DIFFERENTIAL (CANCER CENTER ONLY)
Abs Immature Granulocytes: 0.09 10*3/uL — ABNORMAL HIGH (ref 0.00–0.07)
Basophils Absolute: 0 10*3/uL (ref 0.0–0.1)
Basophils Relative: 0 %
Eosinophils Absolute: 0.1 10*3/uL (ref 0.0–0.5)
Eosinophils Relative: 1 %
HCT: 29.2 % — ABNORMAL LOW (ref 36.0–46.0)
Hemoglobin: 9.1 g/dL — ABNORMAL LOW (ref 12.0–15.0)
Immature Granulocytes: 1 %
Lymphocytes Relative: 31 %
Lymphs Abs: 2.7 10*3/uL (ref 0.7–4.0)
MCH: 24.6 pg — ABNORMAL LOW (ref 26.0–34.0)
MCHC: 31.2 g/dL (ref 30.0–36.0)
MCV: 78.9 fL — ABNORMAL LOW (ref 80.0–100.0)
Monocytes Absolute: 0.6 10*3/uL (ref 0.1–1.0)
Monocytes Relative: 7 %
Neutro Abs: 5.4 10*3/uL (ref 1.7–7.7)
Neutrophils Relative %: 60 %
Platelet Count: 305 10*3/uL (ref 150–400)
RBC: 3.7 MIL/uL — ABNORMAL LOW (ref 3.87–5.11)
RDW: 19 % — ABNORMAL HIGH (ref 11.5–15.5)
WBC Count: 9 10*3/uL (ref 4.0–10.5)
nRBC: 0 % (ref 0.0–0.2)

## 2019-07-25 LAB — FOLATE: Folate: 18.5 ng/mL (ref >5.9)

## 2019-07-25 LAB — FERRITIN: Ferritin: 433 ng/mL — ABNORMAL HIGH (ref 11–307)

## 2019-07-25 LAB — SEDIMENTATION RATE: Sed Rate: 100 mm/hr — ABNORMAL HIGH (ref 0–22)

## 2019-07-25 LAB — LACTATE DEHYDROGENASE: LDH: 158 U/L (ref 98–192)

## 2019-07-25 LAB — SAVE SMEAR (SSMR)

## 2019-07-25 LAB — VITAMIN B12: Vitamin B-12: 213 pg/mL (ref 180–914)

## 2019-07-26 ENCOUNTER — Encounter: Payer: Self-pay | Admitting: Oncology

## 2019-07-26 ENCOUNTER — Other Ambulatory Visit: Payer: Self-pay | Admitting: Oncology

## 2019-07-26 NOTE — Progress Notes (Signed)
I called the patient and left her voicemail letting her know that her sed rate is very high.  I do not know if she is having an infection at present or if this is a separate issue.  I asked her to call us back.  I am also sending her a letter with those labs.

## 2019-07-28 ENCOUNTER — Encounter: Payer: Self-pay | Admitting: Oncology

## 2019-07-31 ENCOUNTER — Other Ambulatory Visit: Payer: Self-pay | Admitting: Oncology

## 2019-07-31 DIAGNOSIS — R700 Elevated erythrocyte sedimentation rate: Secondary | ICD-10-CM

## 2019-07-31 DIAGNOSIS — Z170 Estrogen receptor positive status [ER+]: Secondary | ICD-10-CM

## 2019-07-31 DIAGNOSIS — C50812 Malignant neoplasm of overlapping sites of left female breast: Secondary | ICD-10-CM

## 2019-07-31 DIAGNOSIS — C50912 Malignant neoplasm of unspecified site of left female breast: Secondary | ICD-10-CM

## 2019-07-31 NOTE — Progress Notes (Signed)
I called [name] again, again left voicemail, and also set her up for labs sometime this week.

## 2019-08-01 ENCOUNTER — Telehealth: Payer: Self-pay | Admitting: Oncology

## 2019-08-01 NOTE — Telephone Encounter (Signed)
Scheduled appt pr 4/4 sch message- pt aware pf a[t

## 2019-08-03 ENCOUNTER — Inpatient Hospital Stay: Payer: BC Managed Care – PPO | Attending: Oncology

## 2019-08-03 ENCOUNTER — Other Ambulatory Visit: Payer: Self-pay

## 2019-08-03 DIAGNOSIS — C773 Secondary and unspecified malignant neoplasm of axilla and upper limb lymph nodes: Secondary | ICD-10-CM | POA: Diagnosis not present

## 2019-08-03 DIAGNOSIS — Z9221 Personal history of antineoplastic chemotherapy: Secondary | ICD-10-CM | POA: Insufficient documentation

## 2019-08-03 DIAGNOSIS — Z79810 Long term (current) use of selective estrogen receptor modulators (SERMs): Secondary | ICD-10-CM | POA: Insufficient documentation

## 2019-08-03 DIAGNOSIS — C50812 Malignant neoplasm of overlapping sites of left female breast: Secondary | ICD-10-CM | POA: Diagnosis not present

## 2019-08-03 DIAGNOSIS — C50912 Malignant neoplasm of unspecified site of left female breast: Secondary | ICD-10-CM

## 2019-08-03 DIAGNOSIS — D649 Anemia, unspecified: Secondary | ICD-10-CM | POA: Diagnosis not present

## 2019-08-03 DIAGNOSIS — Z9013 Acquired absence of bilateral breasts and nipples: Secondary | ICD-10-CM | POA: Diagnosis not present

## 2019-08-03 DIAGNOSIS — R700 Elevated erythrocyte sedimentation rate: Secondary | ICD-10-CM

## 2019-08-03 DIAGNOSIS — Z170 Estrogen receptor positive status [ER+]: Secondary | ICD-10-CM

## 2019-08-03 DIAGNOSIS — I890 Lymphedema, not elsewhere classified: Secondary | ICD-10-CM | POA: Insufficient documentation

## 2019-08-03 DIAGNOSIS — Z923 Personal history of irradiation: Secondary | ICD-10-CM | POA: Diagnosis not present

## 2019-08-03 LAB — CBC WITH DIFFERENTIAL/PLATELET
Abs Immature Granulocytes: 0.1 10*3/uL — ABNORMAL HIGH (ref 0.00–0.07)
Basophils Absolute: 0 10*3/uL (ref 0.0–0.1)
Basophils Relative: 0 %
Eosinophils Absolute: 0.3 10*3/uL (ref 0.0–0.5)
Eosinophils Relative: 5 %
HCT: 28.9 % — ABNORMAL LOW (ref 36.0–46.0)
Hemoglobin: 8.8 g/dL — ABNORMAL LOW (ref 12.0–15.0)
Immature Granulocytes: 1 %
Lymphocytes Relative: 48 %
Lymphs Abs: 3.4 10*3/uL (ref 0.7–4.0)
MCH: 24.6 pg — ABNORMAL LOW (ref 26.0–34.0)
MCHC: 30.4 g/dL (ref 30.0–36.0)
MCV: 81 fL (ref 80.0–100.0)
Monocytes Absolute: 0.4 10*3/uL (ref 0.1–1.0)
Monocytes Relative: 6 %
Neutro Abs: 2.9 10*3/uL (ref 1.7–7.7)
Neutrophils Relative %: 40 %
Platelets: 359 10*3/uL (ref 150–400)
RBC: 3.57 MIL/uL — ABNORMAL LOW (ref 3.87–5.11)
RDW: 19.2 % — ABNORMAL HIGH (ref 11.5–15.5)
WBC: 7.2 10*3/uL (ref 4.0–10.5)
nRBC: 0.3 % — ABNORMAL HIGH (ref 0.0–0.2)

## 2019-08-03 LAB — PROTEIN / CREATININE RATIO, URINE
Creatinine, Urine: 126.14 mg/dL
Total Protein, Urine: <6 mg/dL

## 2019-08-03 LAB — COMPREHENSIVE METABOLIC PANEL
ALT: 13 U/L (ref 0–44)
AST: 14 U/L — ABNORMAL LOW (ref 15–41)
Albumin: 3.1 g/dL — ABNORMAL LOW (ref 3.5–5.0)
Alkaline Phosphatase: 85 U/L (ref 38–126)
Anion gap: 10 (ref 5–15)
BUN: 12 mg/dL (ref 6–20)
CO2: 24 mmol/L (ref 22–32)
Calcium: 10 mg/dL (ref 8.9–10.3)
Chloride: 109 mmol/L (ref 98–111)
Creatinine, Ser: 0.83 mg/dL (ref 0.44–1.00)
GFR calc Af Amer: >60 mL/min (ref >60)
GFR calc non Af Amer: >60 mL/min (ref >60)
Glucose, Bld: 102 mg/dL — ABNORMAL HIGH (ref 70–99)
Potassium: 4.3 mmol/L (ref 3.5–5.1)
Sodium: 143 mmol/L (ref 135–145)
Total Bilirubin: <0.2 mg/dL — ABNORMAL LOW (ref 0.3–1.2)
Total Protein: 7.9 g/dL (ref 6.5–8.1)

## 2019-08-03 LAB — C-REACTIVE PROTEIN: CRP: 1.9 mg/dL — ABNORMAL HIGH (ref <1.0)

## 2019-08-03 LAB — SEDIMENTATION RATE: Sed Rate: 80 mm/hr — ABNORMAL HIGH (ref 0–22)

## 2019-08-04 ENCOUNTER — Other Ambulatory Visit: Payer: Self-pay | Admitting: Oncology

## 2019-08-04 LAB — PROTEIN ELECTROPHORESIS, SERUM
A/G Ratio: 0.8 (ref 0.7–1.7)
Albumin ELP: 3.1 g/dL (ref 2.9–4.4)
Alpha-1-Globulin: 0.4 g/dL (ref 0.0–0.4)
Alpha-2-Globulin: 0.9 g/dL (ref 0.4–1.0)
Beta Globulin: 1.6 g/dL — ABNORMAL HIGH (ref 0.7–1.3)
Gamma Globulin: 1.2 g/dL (ref 0.4–1.8)
Globulin, Total: 4 g/dL — ABNORMAL HIGH (ref 2.2–3.9)
Total Protein ELP: 7.1 g/dL (ref 6.0–8.5)

## 2019-08-04 LAB — RHEUMATOID FACTOR: Rhuematoid fact SerPl-aCnc: <10 IU/mL (ref 0.0–13.9)

## 2019-08-04 LAB — KAPPA/LAMBDA LIGHT CHAINS
Kappa free light chain: 54.6 mg/L — ABNORMAL HIGH (ref 3.3–19.4)
Kappa, lambda light chain ratio: 4.51 — ABNORMAL HIGH (ref 0.26–1.65)
Lambda free light chains: 12.1 mg/L (ref 5.7–26.3)

## 2019-08-04 LAB — ANTINUCLEAR ANTIBODIES, IFA: ANA Ab, IFA: NEGATIVE

## 2019-08-05 ENCOUNTER — Other Ambulatory Visit: Payer: Self-pay | Admitting: Oncology

## 2019-08-05 ENCOUNTER — Encounter: Payer: Self-pay | Admitting: Oncology

## 2019-08-05 DIAGNOSIS — R700 Elevated erythrocyte sedimentation rate: Secondary | ICD-10-CM

## 2019-08-05 NOTE — Progress Notes (Signed)
I called [name] with the results of her repeat lab work.  There is still significant inflammation, the sed rate being high.  The ANA however was negative.  The SPEP was negative.  There is a slight kappa/lambda ratio imbalance but that is really nonspecific.  She is having bone pain particularly in her back.  This could just be osteoarthritis but we really do not have any other symptoms other than allergies and I do not think that would do it.  I think she warrants referral to a rheumatologist and we will try to place that in for her.  She is agreeable to this plan.

## 2019-08-08 ENCOUNTER — Other Ambulatory Visit: Payer: Self-pay | Admitting: Oncology

## 2019-08-29 ENCOUNTER — Emergency Department (HOSPITAL_COMMUNITY): Payer: BC Managed Care – PPO

## 2019-08-29 ENCOUNTER — Other Ambulatory Visit: Payer: Self-pay

## 2019-08-29 ENCOUNTER — Emergency Department (HOSPITAL_COMMUNITY)
Admission: EM | Admit: 2019-08-29 | Discharge: 2019-08-29 | Disposition: A | Discharge status: Home / Self Care | Payer: BC Managed Care – PPO | Attending: Emergency Medicine | Admitting: Emergency Medicine

## 2019-08-29 ENCOUNTER — Encounter (HOSPITAL_COMMUNITY): Payer: Self-pay

## 2019-08-29 DIAGNOSIS — R519 Headache, unspecified: Secondary | ICD-10-CM

## 2019-08-29 DIAGNOSIS — Z79899 Other long term (current) drug therapy: Secondary | ICD-10-CM | POA: Diagnosis not present

## 2019-08-29 MED ORDER — PROCHLORPERAZINE EDISYLATE 10 MG/2ML IJ SOLN
10.0000 mg | Freq: Once | INTRAMUSCULAR | Status: AC
  Administered 2019-08-29: 10 mg via INTRAVENOUS
  Filled 2019-08-29: qty 2

## 2019-08-29 MED ORDER — DIPHENHYDRAMINE HCL 50 MG/ML IJ SOLN
25.0000 mg | Freq: Once | INTRAMUSCULAR | Status: AC
  Administered 2019-08-29: 20:00:00 25 mg via INTRAVENOUS
  Filled 2019-08-29: qty 1

## 2019-08-29 NOTE — Progress Notes (Signed)
PT refused MRI due to claus.

## 2019-08-29 NOTE — ED Notes (Signed)
Patient transported to MRI 

## 2019-08-29 NOTE — ED Triage Notes (Signed)
Patient c/o a constant headache x 2 weeks. Patient denies sensitivity to light. Patient states when she looks up, she feels increased pressure above her eyes. Patient denies any N/V.

## 2019-08-29 NOTE — ED Notes (Signed)
MRI states patient refused scan.

## 2019-08-29 NOTE — Discharge Instructions (Addendum)
You were seen in the ER for headache.  We have ordered an MRI to evaluate your brain as there is concern for metastasis of your breast cancer to the brain which you have refused today.  We have discussed the risks of declining this imaging and you have voiced understanding.  Please make sure to follow-up with your oncologist and schedule a follow-up scan as soon as possible.  You are always welcome to return to the ER to perform the scan.  Continue to take Tylenol and ibuprofen over-the-counter or discuss further management with your oncologist.  Return to the ER if your symptoms worsen.

## 2019-08-29 NOTE — ED Provider Notes (Addendum)
[facility] DEPT Provider Note   CSN: [id number] Arrival date & time: 08/29/19  1808     History Chief Complaint  Patient presents with  . Headache    [name] is a 44 y.o. female.  HPI 44 year old female with a history of breast cancer presents to the ER for a constant unrelenting headache that has been persistent for over 2 weeks.  Patient states that she does have a history of tension headaches that are usually relieved by ibuprofen or Tylenol.  Patient states that this headache feels different, states that it feels more like a tenderness to her entire head.  She endorses pressure behind her eyes when she looks upwards but denies any visual changes.  She states she has tried ibuprofen and Tylenol with no relief.  She also noticed a bump to the left upper forehead that developed today while at work.  Denies any head injury.  Does not think this is a bug bite.  She also states she has some light neck stiffness but this appears to be more muscular in nature.  She denies any fevers, chills, light sensitivity, severe neck stiffness, nausea, vomiting, abdominal pain, syncope, unintended weight weight loss.  She endorses night sweats but she states that this is secondary to her menopause.    Past Medical History:  Diagnosis Date  . Back muscle spasm    occasional tx with skelaxin  . Cancer ([place])    breast ca  . GERD (gastroesophageal reflux disease)   . Malignant neoplasm of breast (female), unspecified site   . Radiation 10/24/13-12/09/13   Left chestwall/supraclav./scar    Patient Active Problem List   Diagnosis Date Noted  . Elevated sed rate (elev SR) 07/31/2019  . History of mastectomy, bilateral 06/29/2018  . History of reconstruction of both breasts 06/29/2018  . Acquired absence of breast and absent nipple, bilateral 02/16/2018  . Capsular contracture of breast implant 02/16/2018  . Malignant neoplasm of overlapping sites of left breast  in female, estrogen receptor positive ([place]) 11/03/2016  . Bulge of cervical disc without myelopathy 04/17/2015  . Chest skin lesion 09/20/2014  . Hot flashes 08/08/2014  . Weight gain 08/08/2014  . Endometriosis of ovary 01/25/2014  . Fibroids, subserous 01/25/2014  . Lymphedema of arm 01/09/2014  . Chemotherapy induced neutropenia ([place]) 10/10/2013  . Lower back pain 10/10/2013  . Bilateral lower extremity edema 09/08/2013  . Anxiety 09/08/2013  . Depression 08/10/2013  . Anemia 07/20/2013  . Pain in left axilla 07/06/2013  . Breast cancer metastasized to axillary lymph node, left ([place]) 05/11/2013    Past Surgical History:  Procedure Laterality Date  . AXILLARY LYMPH NODE DISSECTION Left 06/16/2013   Procedure: LEFT AXILLARY NODE DISSECTION;  Surgeon: Shann Medal, MD;  Location: [facility];  Service: General;  Laterality: Left;  . BILATERAL TOTAL MASTECTOMY WITH AXILLARY LYMPH NODE DISSECTION    . BREAST IMPLANT REMOVAL Bilateral 03/18/2018   Procedure: REMOVAL BREAST IMPLANTS;  Surgeon: Wallace Going, DO;  Location: [facility];  Service: Plastics;  Laterality: Bilateral;  . BREAST SURGERY    . CAPSULECTOMY Bilateral 03/18/2018   Procedure: CAPSULECTOMY;  Surgeon: Wallace Going, DO;  Location: [facility];  Service: Plastics;  Laterality: Bilateral;  . LAPAROSCOPIC BILATERAL SALPINGO OOPHERECTOMY Bilateral 01/25/2014   Procedure: LAPAROSCOPIC BILATERAL SALPINGO OOPHORECTOMY;  Surgeon: Eldred Manges, MD;  Location: [facility];  Service: Gynecology;  Laterality: Bilateral;  . LEEP    . PLACEMENT  OF BREAST IMPLANTS Bilateral 03/18/2018   Procedure: PLACEMENT OF BREAST IMPLANTS;  Surgeon: Wallace Going, DO;  Location: [facility];  Service: Plastics;  Laterality: Bilateral;  . PORT-A-CATH REMOVAL N/A 01/25/2014   Procedure: REMOVAL PORT-A-CATH;  Surgeon: Alphonsa Overall, MD;  Location: [facility];  Service: General;  Laterality: N/A;    . PORTACATH PLACEMENT N/A 06/16/2013   Procedure: POWER PORT PLACEMENT;  Surgeon: Shann Medal, MD;  Location: [facility];  Service: General;  Laterality: N/A;  . right leg surgery     broken femur - has rod in leg  . TONSILLECTOMY    . WISDOM TOOTH EXTRACTION       OB History   No obstetric history on file.    Obstetric Comments  Menarche 10,No full-term pregnancies.birth control pills x 18 years. LMP  07/18/2013        Family History  Problem Relation Age of Onset  . Other Mother 48       glioblastoma; deceased 34  . Cancer Mother   . Other Father 23       glioblastoma; deceased 37  . Cancer Father   . Hypertension Other     Social History   Tobacco Use  . Smoking status: Never Smoker  . Smokeless tobacco: Never Used  Substance Use Topics  . Alcohol use: Yes    Comment: socially wine  . Drug use: No    Home Medications Prior to Admission medications   Medication Sig Start Date End Date Taking? Authorizing Provider  acetaminophen (TYLENOL) 500 MG tablet Take 500 mg by mouth every 6 (six) hours as needed for moderate pain.   Yes [provider]  ibuprofen (ADVIL) 200 MG tablet Take 600 mg by mouth every 6 (six) hours as needed for headache or moderate pain.   Yes [provider]  tamoxifen (NOLVADEX) 20 MG tablet Take 1 tablet (20 mg total) by mouth daily. 01/26/19  Yes Magrinat, Virgie Dad, MD    Allergies    Sulfa antibiotics, Gadolinium derivatives, and Petrolatum  Review of Systems   Review of Systems  Constitutional: Negative for chills, fatigue and fever.  HENT: Negative for ear pain and sore throat.   Eyes: Positive for pain. Negative for photophobia and visual disturbance.  Respiratory: Negative for cough and shortness of breath.   Cardiovascular: Negative for chest pain and palpitations.  Gastrointestinal: Negative for abdominal pain and vomiting.  Genitourinary: Negative for dysuria and hematuria.  Musculoskeletal: Positive for neck  pain and neck stiffness. Negative for arthralgias, back pain and myalgias.  Skin: Negative for color change, rash and wound.  Neurological: Positive for headaches. Negative for seizures and syncope.  Psychiatric/Behavioral: Negative for behavioral problems and confusion.  All other systems reviewed and are negative.   Physical Exam Updated Vital Signs BP 120/84   Pulse 88   Temp 98.5 F (36.9 C) (Oral)   Resp 17   Ht 5\' 8"  (1.727 m)   Wt 84.8 kg   SpO2 99%   BMI 28.43 kg/m   Physical Exam Vitals and nursing note reviewed.  Constitutional:      General: She is not in acute distress.    Appearance: She is well-developed. She is not ill-appearing, toxic-appearing or diaphoretic.  HENT:     Head: Normocephalic and atraumatic.     Mouth/Throat:     Mouth: Mucous membranes are moist.     Pharynx: Oropharynx is clear.  Eyes:     Extraocular Movements: Extraocular  movements intact.     Right eye: Normal extraocular motion and no nystagmus.     Left eye: Normal extraocular motion and no nystagmus.     Conjunctiva/sclera: Conjunctivae normal.     Pupils: Pupils are equal, round, and reactive to light. Pupils are equal.     Right eye: Pupil is round and reactive.     Left eye: Pupil is round and reactive.  Neck:     Meningeal: Brudzinski's sign and Kernig's sign absent.  Cardiovascular:     Rate and Rhythm: Normal rate and regular rhythm.     Heart sounds: No murmur.  Pulmonary:     Effort: Pulmonary effort is normal. No respiratory distress.     Breath sounds: Normal breath sounds.  Abdominal:     Palpations: Abdomen is soft.     Tenderness: There is no abdominal tenderness.  Musculoskeletal:        General: Tenderness present. No swelling. Normal range of motion.     Cervical back: Normal range of motion and neck supple. No rigidity.     Comments: Tenderness over right and left trapezius muscles.  No cervical midline tenderness, no noticeable step-offs, crepitus    Lymphadenopathy:     Cervical: No cervical adenopathy.  Skin:    General: Skin is warm and dry.     Findings: No erythema or rash.     Comments: Quarter sized raised tender nonfluctuant nonerythematous protrusion on left upper hairline/scalp.    Neurological:     Mental Status: She is alert and oriented to person, place, and time.     GCS: GCS eye subscore is 4. GCS verbal subscore is 5. GCS motor subscore is 6.     Cranial Nerves: No cranial nerve deficit.     Sensory: No sensory deficit.     Motor: No weakness.     Deep Tendon Reflexes: Reflexes normal.     Comments: Mental Status:  Alert, thought content appropriate, able to give a coherent history. Speech fluent without evidence of aphasia. Able to follow 2 step commands without difficulty.  Cranial Nerves:  II: Peripheral visual fields grossly normal, pupils equal, round, reactive to light III,IV, VI: ptosis not present, extra-ocular motions intact bilaterally  V,VII: smile symmetric, facial light touch sensation equal VIII: hearing grossly normal to voice  X: uvula elevates symmetrically  XI: bilateral shoulder shrug symmetric and strong XII: midline tongue extension without fassiculations Motor:  Normal tone. 5/5 strength of BUE and BLE major muscle groups including strong and equal grip strength and dorsiflexion/plantar flexion Sensory: light touch normal in all extremities. Cerebellar: normal finger-to-nose with bilateral upper extremities, Romberg sign absent Gait: normal gait and balance. Able to walk on toes and heels with ease.    Psychiatric:        Mood and Affect: Mood normal.        Behavior: Behavior normal.     ED Results / Procedures / Treatments   Labs (all labs ordered are listed, but only abnormal results are displayed) Labs Reviewed - No data to display  EKG None  Radiology No results found.  Procedures Procedures (including critical care time)  Medications Ordered in ED Medications   prochlorperazine (COMPAZINE) injection 10 mg (10 mg Intravenous Given 08/29/19 2013)  diphenhydrAMINE (BENADRYL) injection 25 mg (25 mg Intravenous Given 08/29/19 2014)    ED Course  I have reviewed the triage vital signs and the nursing notes.  Pertinent labs & imaging results that were available during my  care of the patient were reviewed by me and considered in my medical decision making (see chart for details).    MDM Rules/Calculators/A&P                     44 year old female with 2-week history of headache which is atypical for her. On presentation, patient is alert and oriented, nontoxic-appearing.  Physical exam without neurological deficits.  No indication for meningitis.  Patient reports that this headache feels unlike other ones and is unrelieved with Tylenol and ibuprofen.  Concern for metastasis to the brain given her history of breast cancer.  Will treat with GI cocktail, discussed case with Dr. Regenia Skeeter and will order MR venogram and MR brain with and without contrast.   9 PM: MRI contacted me stated that the patient declined the scan secondary to anxiety.  Anxiety medications were offered to the patient but she still refused, stating that she was not out for the scan today requesting to do it another day.  I had a long discussion about the risks of of not performing the imaging today.  She voices understanding and is still declining MRI.  We have discussed further follow-up, I will refer her back to her oncologist to schedule a possible follow-up MRI scan.  Reevaluation, patient notes significant improvement in her headache from migraine cocktail.  I have messaged her provider Dr. Jana Hakim informing him of this visit and possible need for MRI as well.  At this stage the patient is stable for discharge.  Strict return precautions given, pt was informed that she may always return to the ER if she wants to get a follow-up scan.  Discussed the case with Dr. Regenia Skeeter, he is agreeable to  the above plan.  Final Clinical Impression(s) / ED Diagnoses Final diagnoses:  Nonintractable headache, unspecified chronicity pattern, unspecified headache type    Rx / DC Orders ED Discharge Orders    None       Garald Balding, PA-C 08/29/19 2057    Garald Balding, PA-C 08/29/19 2119    Sherwood Gambler, MD 08/30/19 [phone]

## 2019-08-31 ENCOUNTER — Telehealth: Payer: Self-pay | Admitting: *Deleted

## 2019-08-31 NOTE — Telephone Encounter (Signed)
Pt called to this RN per her visit to the ER with recommendation to have an MRI of her head due to onset of new type of headache.  She declined at the ER visit due to several issues including anxiety as well as she wanted to have Dr Gerarda Fraction perspective.  Note Hilliary states she developed a " like sore left forehead headache that was sore even to touch with light sensitivity "  Today post medications given in ER pain has lessened and she is no longer sensitive to light.  She states her left forehead is still " tender if I touch or squint "  She denies any other issues and is able to maintain overall ADL's.  Above reviewed with MD and call returned to pt informing her presently due to improvement MRI not indicated for concerns relating to possible recurrent cancer.  Pt was advised on OTC medications to use for possible further benefit as well as if headache is unrelieved or worsens she should proceed back to the ER and proceed with recommendations.  Of note this RN also followed up on referral to Dr Trudie Reed for rheumatology work up due to noted elevated ANA ( normal other labs )- per this RN's call to Dr Trudie Reed- additional data requested for completion of referral not yet received.  This RN faxed requested data.  Informed [place] of above as well.  No further questions or needs at this time.

## 2019-09-01 ENCOUNTER — Encounter: Payer: Self-pay | Admitting: Oncology

## 2019-09-01 ENCOUNTER — Other Ambulatory Visit: Payer: Self-pay | Admitting: *Deleted

## 2019-09-01 ENCOUNTER — Telehealth: Payer: Self-pay | Admitting: *Deleted

## 2019-09-01 ENCOUNTER — Other Ambulatory Visit: Payer: Self-pay | Admitting: Oncology

## 2019-09-01 DIAGNOSIS — G44009 Cluster headache syndrome, unspecified, not intractable: Secondary | ICD-10-CM

## 2019-09-01 NOTE — Progress Notes (Signed)
x

## 2019-09-01 NOTE — Telephone Encounter (Signed)
Called pt earlier per Dr Magrinat's request to see how she was doing & if h/a is gone.  She reported that h/a was a little better but still tender to touch at L side of forehead. She denies any visual changes or disorientation & is doing her normal routine.  She also reported that she has a rheumatology appt in July.   Returned call to pt & informed that Dr Jana Hakim wanted her to have a CT of her head.  Informed that Central Scheduling should give her a call.  She expressed understanding.

## 2019-09-02 ENCOUNTER — Encounter: Payer: Self-pay | Admitting: Oncology

## 2019-09-07 ENCOUNTER — Encounter: Payer: Self-pay | Admitting: Oncology

## 2019-09-07 ENCOUNTER — Other Ambulatory Visit: Payer: Self-pay

## 2019-09-07 ENCOUNTER — Ambulatory Visit (HOSPITAL_BASED_OUTPATIENT_CLINIC_OR_DEPARTMENT_OTHER)
Admission: RE | Admit: 2019-09-07 | Discharge: 2019-09-07 | Disposition: A | Discharge status: Home / Self Care | Payer: BC Managed Care – PPO | Source: Ambulatory Visit | Attending: Oncology | Admitting: Oncology

## 2019-09-07 DIAGNOSIS — G44009 Cluster headache syndrome, unspecified, not intractable: Secondary | ICD-10-CM | POA: Diagnosis not present

## 2019-09-07 DIAGNOSIS — C50919 Malignant neoplasm of unspecified site of unspecified female breast: Secondary | ICD-10-CM | POA: Diagnosis not present

## 2019-09-07 DIAGNOSIS — R519 Headache, unspecified: Secondary | ICD-10-CM | POA: Diagnosis not present

## 2019-09-07 MED ORDER — IOHEXOL 300 MG/ML  SOLN
80.0000 mL | Freq: Once | INTRAMUSCULAR | Status: AC | PRN
  Administered 2019-09-07: 80 mL via INTRAVENOUS

## 2019-09-07 NOTE — Telephone Encounter (Signed)
This RN spoke with per pt her request for call- discussed negative findings on CT of her head. She inquired if she should proceed with an eye appointment for possible cause.  This RN informed her above would be prudent if she has not had an exam recently but that also she may be developing new migraine onset and best to follow up with her primary MD. [place] states she sees Dr Abagail Kitchens and will contact her office.  No further needs at this time.

## 2019-09-09 ENCOUNTER — Telehealth: Payer: Self-pay | Admitting: Oncology

## 2019-09-09 NOTE — Telephone Encounter (Signed)
Faxed medical records to [facility] Fax: [phone] Release [phone]

## 2019-09-21 DIAGNOSIS — H532 Diplopia: Secondary | ICD-10-CM | POA: Diagnosis not present

## 2019-09-21 DIAGNOSIS — R519 Headache, unspecified: Secondary | ICD-10-CM | POA: Diagnosis not present

## 2019-09-21 DIAGNOSIS — H5032 Intermittent alternating esotropia: Secondary | ICD-10-CM | POA: Diagnosis not present

## 2019-12-28 ENCOUNTER — Encounter: Payer: Self-pay | Admitting: *Deleted

## 2019-12-28 ENCOUNTER — Encounter: Payer: Self-pay | Admitting: Oncology

## 2020-01-25 ENCOUNTER — Other Ambulatory Visit: Payer: Self-pay

## 2020-01-25 ENCOUNTER — Ambulatory Visit: Payer: Self-pay | Admitting: Oncology

## 2020-01-29 NOTE — Progress Notes (Signed)
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   She continues on tamoxifen.  She tolerates this with no significant side effects.  Since her last visit, she underwent head CT on 09/07/2019 due to a left temporal headache. This was negative.  She is status post bilateral mastectomies and does not require annual mammography.  Evaluation of her anemia and April 2021 showed a sed rate of 100.  ANA and rheumatoid factor were negative.  B12 and folate were normal.  Ferritin was elevated.  She was referred to rheumatology and apparently did have an appointment but then did not show.  Instead she got magnesium salt baths increased her exercise and diet it.  She says the pain in her joints and shoulders in particular is already much better and she feels really good.  REVIEW OF SYSTEMS: [name] feels a little bit chilly at times.  She has not had a COVID-19 vaccine but is considering it.  Family is doing well.  She is working mostly from home.  She has had no adenopathy, no drenching sweats, no fevers, and no rash.  A detailed review of systems today was otherwise stable   BREAST CANCER HISTORY:  From the original intake note:  [name] had bilateral diagnostic mammography [facility] in [place] 11/30/2009 showing a 1.5 cm mass at the 9:00 position of the left breast with some satellites. A biopsy of the mass in  question [facility] showed ([id number]) and invasive lobular carcinoma, which was estrogen and progesterone receptor both 100% positive, both with strong staining intensity, but HER-2 negative at 1+. Bilateral breast MRI 12/13/2009 in [place] showed in the left breast an irregularly marginated mass measuring 3.2 cm. There were 4 or 5 separate satellite lesions laterally and superior to the primary.  Accordingly, after appropriate discussion, the patient underwent bilateral mastectomies with left sentinel lymph node sampling 01/02/2010. The right breast was benign. The left breast showed a 2.3 cm invasive lobular carcinoma, grade 3, with a total of 6 sentinel lymph nodes removed, all negative, although 2 showed isolated tumor cells by immunostaining. Margins were negative.  An Oncotype BX was sent, with a recurrence score of 22, predicting a risk of distant recurrence within 10 years with 14% of the patients only systemic treatment was tamoxifen. The patient was treated with CMF chemotherapy between 02/22/2010 and 07/05/2010, receiving 7 cycles at which point the patient refused further chemotherapy though there have been no major toxicities at according to the oncology note from Dr. Brent General. The patient was then started on tamoxifen 08/09/2010. By her cancer took approximately 12-15 months then stopped because of hot flashes and insomnia problems.  More recently, the patient noted a change in her left axilla andunderwent bilateral breast MRI January to 2015 at [facility]. The patient has bilateral submuscular silicone implants in place. The breast were unremarkable, but there were several lymph nodes with cortical thickening in the left axilla including a dominant 1 measuring 1.2 cm in the short  axis. Ultrasonography of this area identified the lymph node in question and the patient underwent biopsy of the left axillary lymph node This showed ([id number]) near-total replacement of  the lymph node by metastatic carcinoma. This was 96% estrogen receptor positive, and 84% progesterone receptor positive, both with strong staining intensity. The MIB-1 was 20%. HER-2 determination is pending.  The patient's subsequent history is as detailed below   PAST MEDICAL HISTORY: Past Medical History:  Diagnosis Date  . Back muscle spasm    occasional tx with skelaxin  . Cancer ([place])    breast ca  . GERD (gastroesophageal reflux disease)   . Malignant neoplasm of breast (female), unspecified site   . Radiation 10/24/13-12/09/13   Left chestwall/supraclav./scar    PAST SURGICAL HISTORY: Past Surgical History:  Procedure Laterality Date  . AXILLARY LYMPH NODE DISSECTION Left 06/16/2013   Procedure: LEFT AXILLARY NODE DISSECTION;  Surgeon: Shann Medal, MD;  Location: [facility];  Service: General;  Laterality: Left;  . BILATERAL TOTAL MASTECTOMY WITH AXILLARY LYMPH NODE DISSECTION    . BREAST IMPLANT REMOVAL Bilateral 03/18/2018   Procedure: REMOVAL BREAST IMPLANTS;  Surgeon: Wallace Going, DO;  Location: [facility];  Service: Plastics;  Laterality: Bilateral;  . BREAST SURGERY    . CAPSULECTOMY Bilateral 03/18/2018   Procedure: CAPSULECTOMY;  Surgeon: Wallace Going, DO;  Location: [facility];  Service: Plastics;  Laterality: Bilateral;  . LAPAROSCOPIC BILATERAL SALPINGO OOPHERECTOMY Bilateral 01/25/2014   Procedure: LAPAROSCOPIC BILATERAL SALPINGO OOPHORECTOMY;  Surgeon: Eldred Manges, MD;  Location: [facility];  Service: Gynecology;  Laterality: Bilateral;  . LEEP    . PLACEMENT OF BREAST IMPLANTS Bilateral 03/18/2018   Procedure: PLACEMENT OF BREAST IMPLANTS;  Surgeon: Wallace Going, DO;  Location: [facility];  Service: Plastics;  Laterality: Bilateral;  . PORT-A-CATH REMOVAL N/A 01/25/2014   Procedure: REMOVAL PORT-A-CATH;  Surgeon: Alphonsa Overall, MD;  Location: [facility];  Service: General;  Laterality: N/A;  .  PORTACATH PLACEMENT N/A 06/16/2013   Procedure: POWER PORT PLACEMENT;  Surgeon: Shann Medal, MD;  Location: [facility];  Service: General;  Laterality: N/A;  . right leg surgery     broken femur - has rod in leg  . TONSILLECTOMY    . WISDOM TOOTH EXTRACTION      FAMILY HISTORY Family History  Problem Relation Age of Onset  . Other Mother 68       glioblastoma; deceased 43  . Cancer Mother   . Other Father 63       glioblastoma; deceased 60  . Cancer Father   . Hypertension Other    according to the patient both her parents died from a primary brain cancers, namely we have the stomas, her father at 39, her mother at 70. The patient had one brother and one sister. There is no history of breast or ovarian cancer in the family   GYNECOLOGIC HISTORY:   (Reviewed 10/10/2013) Menarche age 21. The patient has never carried a child to term. She was still having regular periods at the time of her recent diagnosis. LMP 07/18/2013 as of 09/08/2013.  The patient took oral contraceptives between the ages of 1 and 65 with no complications. She stopped having periods with her chemotherapy in 2015.    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 She currently works  as a Research scientist (physical sciences). She shares a home with her significant other, Burnice Logan, who works at installing car radios and also in [place]: Not in place   HEALTH MAINTENANCE:  Social History   Tobacco Use  . Smoking status: Never Smoker  . Smokeless tobacco: Never Used  Substance Use Topics  . Alcohol use: Yes    Comment: socially wine  . Drug use: No     Colonoscopy: Never  PAP: December 2014  Bone density: Never  Lipid panel: Not on file   Allergies  Allergen Reactions  . Sulfa Antibiotics Hives and Itching  . Gadolinium Derivatives Hives    After gado injection, pt had a few  small hives on left breast area. Treated with benadryl by dr Janeece Fitting   . Petrolatum Hives and Rash    Current Outpatient Medications  Medication Sig Dispense Refill  . acetaminophen (TYLENOL) 500 MG tablet Take 500 mg by mouth every 6 (six) hours as needed for moderate pain.    Marland Kitchen ibuprofen (ADVIL) 200 MG tablet Take 600 mg by mouth every 6 (six) hours as needed for headache or moderate pain.    . tamoxifen (NOLVADEX) 20 MG tablet Take 1 tablet (20 mg total) by mouth daily. 90 tablet 3   No current facility-administered medications for this visit.    OBJECTIVE: African-American woman who appears well  Vitals:   01/30/20 0918  BP: 113/70  Pulse: 81  Resp: 20  Temp: (!) 97.4 F (36.3 C)  SpO2: 100%   Wt Readings from Last 3 Encounters:  01/30/20 182 lb (82.6 kg)  08/29/19 187 lb (84.8 kg)  01/26/19 207 lb 8 oz (94.1 kg)   Body mass index is 27.67 kg/m.    ECOG FS:1 - Symptomatic but completely ambulatory  Sclerae unicteric, EOMs intact Wearing a mask No cervical or supraclavicular adenopathy Lungs no rales or rhonchi Heart regular rate and rhythm Abd soft, nontender, positive bowel sounds MSK no focal spinal tenderness, no upper extremity lymphedema Neuro: nonfocal, well oriented, appropriate affect Breasts: Status post bilateral mastectomies with bilateral saline implant reconstruction.  There is no evidence of local recurrence.  Both axillae are benign.   LAB RESULTS: Lab Results  Component Value Date   WBC 7.9 01/30/2020   NEUTROABS 4.8 01/30/2020   HGB 7.8 (L) 01/30/2020   HCT 26.4 (L) 01/30/2020   MCV 83.8 01/30/2020   PLT 311 01/30/2020      Chemistry      Component Value Date/Time   NA 143 08/03/2019 0722   NA 141 10/30/2016 0813   K 4.3 08/03/2019 0722   K 4.0 10/30/2016 0813   CL 109 08/03/2019 0722   CO2 24 08/03/2019 0722   CO2 26 10/30/2016 0813   BUN 12 08/03/2019 0722   BUN 15.2 10/30/2016 0813   CREATININE 0.83 08/03/2019 0722    CREATININE 0.8 10/30/2016 0813      Component Value Date/Time   CALCIUM 10.0 08/03/2019 0722   CALCIUM 10.0 10/30/2016 0813   ALKPHOS 85 08/03/2019 0722   ALKPHOS 59 10/30/2016 0813   AST 14 (L) 08/03/2019 0722   AST 17 10/30/2016 0813   ALT 13 08/03/2019 0722   ALT 11 10/30/2016 0813   BILITOT <0.2 (L) 08/03/2019 0722   BILITOT 0.26 10/30/2016 0813      STUDIES: No results found.   ASSESSMENT: 44 y.o. BRCA negative [place] woman  (1) status post bilateral mastectomies with left axillary lymph node sampling [6  nodes removed] 01/02/2010 for a pT2 pN0(i+), stage IIA invasive lobular breast cancer, grade 3, estrogen and progesterone receptor positive, HER-2 not amplified; right breast was benign  (2) Oncotype DX score of 22 predicted a 14% risk of distant recurrence within 10 years if the patient's only systemic treatment is tamoxifen for 5 years  (3) status post CMF(cyclophosphamide, fluorouracil, methotrexate) x7 given between October of 2011 and March of 2012 (7 of 8 planned treatments completed)  (4) tamoxifen started April 2012, discontinued approximately July of 2013 (about 15 months) because of problems with hot flashes and insomnia  (5) pathologically documented left axillary recurrence 05/05/2013, the tumor being again estrogen and progesterone receptor positive, with HER-2 not amplified, and MIB-1 of 15%. Staging CT/PET 05/27/2013 show left axillary and supraclavicular recurrence, no distant disease  (6) status post completion left axillary lymph node dissection 06/16/2013 showing a total of 10/15 lymph nodes involved by tumor, with evidence of extracapsular extension (TX N3 = stage IIIC)  (7) treated with carboplatin/ docetaxel, first dose on 07/11/2013, repeated every 21 days x 4 with Neulasta support on day 2.  Final dose was given on 09/12/2013, with chemotherapy discontinued after 4 cycles due to continuing low counts.  (8) radiation completed 12/09/2013: Left  chest wall / 50.4 Gray @ 1.8 Gray per fraction x 28 fractions Left Supraclavicular fossa and PAB/ 45 Gray _0 .8 Gray per fraction x 25 fractions Left scar / 10 Gray at [employer] per fraction x 5 fractions   (9) bilateral salpingo-oophorectomy on 01/25/14  (10)   lymphedema and limited range of motion in the left upper extremity secondary to left axillary lymph node dissection, being treated through the lymphedema clinic  - improved   (13) sequencing and deletion/duplication analysis of 17 genes performed September 2015 at [facility] showed no deleterious mutations in  ATM, BARD1, BRCA1, BRCA2, BRIP1, CDH1, CHEK2, MRE11A, MUTYH, NBN, NF1, PALB2, PTEN, RAD50, RAD51C, RAD51D, and TP53.  (14) tamoxifen re-started 02/27/2014  (15) bilateral breast implant exchange with capsulotomies 03/18/2018 following left implant rupture and bilateral capsular contractures. Left - [employer]or Smooth Round Ultra High Profile Gel 590cc. Ref #[id number].  Serial Number [id number] Right - [employer] Smooth Round Ultra High Profile Gel 590cc. Ref #[id number].  Serial Number [id number]  (16) temporal arteritis?:  Labs April 2021 show normal B12 and folate, elevated ferritin, sed rate 100, negative ANA, negative rheumatoid factor   PLAN: [name] is now just about 6 years out from definitive surgery for her breast cancer with no evidence of disease recurrence.  This is very favorable.  Even though she looks and feels remarkably well her anemia is worse. 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 I am referring her to rheumatology and hopefully she can be seen within the month. We could start steroids empirically but she is essentially asymptomatic and I don;t want to muddy the waters.  As far as her breast cancer is concerned, I am making her a return appointment here in a year. The plan is to continue tamoxifen a total of 10 years.  Total encounter time 30  minutes.*   Nolene Rocks, Virgie Dad, MD  01/30/20 9:41 AM Medical Oncology and Hematology [facility] [place], [place] [address] Tel. [phone]    Fax. [phone]   I, Wilburn Mylar, am acting as scribe for Dr. Virgie Dad. Vinay Ertl.  Lindie Spruce MD, have reviewed the above documentation for accuracy and  completeness, and I agree with the above.    *Total Encounter Time as defined by the Centers for Medicare and Medicaid Services includes, in addition to the face-to-face time of a patient visit (documented in the note above) non-face-to-face time: obtaining and reviewing outside history, ordering and reviewing medications, tests or procedures, care coordination (communications with other health care professionals or caregivers) and documentation in the medical record.

## 2020-01-30 ENCOUNTER — Other Ambulatory Visit: Payer: Self-pay | Admitting: *Deleted

## 2020-01-30 ENCOUNTER — Inpatient Hospital Stay: Payer: BC Managed Care – PPO | Attending: Oncology

## 2020-01-30 ENCOUNTER — Inpatient Hospital Stay (HOSPITAL_BASED_OUTPATIENT_CLINIC_OR_DEPARTMENT_OTHER): Payer: BC Managed Care – PPO | Admitting: Oncology

## 2020-01-30 ENCOUNTER — Encounter: Payer: Self-pay | Admitting: Oncology

## 2020-01-30 ENCOUNTER — Other Ambulatory Visit: Payer: Self-pay

## 2020-01-30 VITALS — BP 113/70 | HR 81 | Temp 97.4°F | Resp 20 | Ht 68.0 in | Wt 182.0 lb

## 2020-01-30 DIAGNOSIS — Z79810 Long term (current) use of selective estrogen receptor modulators (SERMs): Secondary | ICD-10-CM | POA: Insufficient documentation

## 2020-01-30 DIAGNOSIS — I890 Lymphedema, not elsewhere classified: Secondary | ICD-10-CM | POA: Diagnosis not present

## 2020-01-30 DIAGNOSIS — Z923 Personal history of irradiation: Secondary | ICD-10-CM | POA: Insufficient documentation

## 2020-01-30 DIAGNOSIS — Z170 Estrogen receptor positive status [ER+]: Secondary | ICD-10-CM

## 2020-01-30 DIAGNOSIS — Z9221 Personal history of antineoplastic chemotherapy: Secondary | ICD-10-CM | POA: Diagnosis not present

## 2020-01-30 DIAGNOSIS — C50912 Malignant neoplasm of unspecified site of left female breast: Secondary | ICD-10-CM

## 2020-01-30 DIAGNOSIS — Z808 Family history of malignant neoplasm of other organs or systems: Secondary | ICD-10-CM | POA: Insufficient documentation

## 2020-01-30 DIAGNOSIS — C50812 Malignant neoplasm of overlapping sites of left female breast: Secondary | ICD-10-CM

## 2020-01-30 DIAGNOSIS — Z9079 Acquired absence of other genital organ(s): Secondary | ICD-10-CM | POA: Diagnosis not present

## 2020-01-30 DIAGNOSIS — C773 Secondary and unspecified malignant neoplasm of axilla and upper limb lymph nodes: Secondary | ICD-10-CM | POA: Diagnosis not present

## 2020-01-30 DIAGNOSIS — D649 Anemia, unspecified: Secondary | ICD-10-CM | POA: Insufficient documentation

## 2020-01-30 DIAGNOSIS — Z9013 Acquired absence of bilateral breasts and nipples: Secondary | ICD-10-CM | POA: Insufficient documentation

## 2020-01-30 DIAGNOSIS — Z90722 Acquired absence of ovaries, bilateral: Secondary | ICD-10-CM | POA: Diagnosis not present

## 2020-01-30 LAB — CBC WITH DIFFERENTIAL (CANCER CENTER ONLY)
Abs Immature Granulocytes: 0.05 10*3/uL (ref 0.00–0.07)
Basophils Absolute: 0 10*3/uL (ref 0.0–0.1)
Basophils Relative: 0 %
Eosinophils Absolute: 0.1 10*3/uL (ref 0.0–0.5)
Eosinophils Relative: 1 %
HCT: 26.4 % — ABNORMAL LOW (ref 36.0–46.0)
Hemoglobin: 7.8 g/dL — ABNORMAL LOW (ref 12.0–15.0)
Immature Granulocytes: 1 %
Lymphocytes Relative: 32 %
Lymphs Abs: 2.5 10*3/uL (ref 0.7–4.0)
MCH: 24.8 pg — ABNORMAL LOW (ref 26.0–34.0)
MCHC: 29.5 g/dL — ABNORMAL LOW (ref 30.0–36.0)
MCV: 83.8 fL (ref 80.0–100.0)
Monocytes Absolute: 0.5 10*3/uL (ref 0.1–1.0)
Monocytes Relative: 6 %
Neutro Abs: 4.8 10*3/uL (ref 1.7–7.7)
Neutrophils Relative %: 60 %
Platelet Count: 311 10*3/uL (ref 150–400)
RBC: 3.15 MIL/uL — ABNORMAL LOW (ref 3.87–5.11)
RDW: 22.6 % — ABNORMAL HIGH (ref 11.5–15.5)
WBC Count: 7.9 10*3/uL (ref 4.0–10.5)
nRBC: 0.4 % — ABNORMAL HIGH (ref 0.0–0.2)

## 2020-01-30 LAB — CMP (CANCER CENTER ONLY)
ALT: 9 U/L (ref 0–44)
AST: 12 U/L — ABNORMAL LOW (ref 15–41)
Albumin: 3.2 g/dL — ABNORMAL LOW (ref 3.5–5.0)
Alkaline Phosphatase: 102 U/L (ref 38–126)
Anion gap: 6 (ref 5–15)
BUN: 11 mg/dL (ref 6–20)
CO2: 27 mmol/L (ref 22–32)
Calcium: 10.3 mg/dL (ref 8.9–10.3)
Chloride: 106 mmol/L (ref 98–111)
Creatinine: 0.83 mg/dL (ref 0.44–1.00)
GFR, Est AFR Am: >60 mL/min (ref >60)
GFR, Estimated: >60 mL/min (ref >60)
Glucose, Bld: 98 mg/dL (ref 70–99)
Potassium: 4.6 mmol/L (ref 3.5–5.1)
Sodium: 139 mmol/L (ref 135–145)
Total Bilirubin: 0.3 mg/dL (ref 0.3–1.2)
Total Protein: 7.9 g/dL (ref 6.5–8.1)

## 2020-01-30 MED ORDER — TAMOXIFEN CITRATE 20 MG PO TABS: 20.0000 mg | ORAL_TABLET | Freq: Every day | ORAL | 3 refills | Status: DC

## 2020-01-31 ENCOUNTER — Encounter: Payer: Self-pay | Admitting: Oncology

## 2020-02-01 ENCOUNTER — Telehealth: Payer: Self-pay | Admitting: Oncology

## 2020-02-01 NOTE — Telephone Encounter (Signed)
Scheduled appt per 10/4 los - mailed reminder letter to patient with appt date and time

## 2020-02-07 ENCOUNTER — Encounter: Payer: Self-pay | Admitting: Oncology

## 2020-02-08 ENCOUNTER — Telehealth: Payer: Self-pay | Admitting: Oncology

## 2020-02-08 NOTE — Telephone Encounter (Signed)
Released records to [facility] rheumatology at [phone]   Release:  [id number]

## 2020-02-08 NOTE — Progress Notes (Signed)
Referral request successfully faxed over to [facility].

## 2020-02-22 ENCOUNTER — Other Ambulatory Visit: Payer: Self-pay | Admitting: Oncology

## 2020-02-22 DIAGNOSIS — M25551 Pain in right hip: Secondary | ICD-10-CM | POA: Diagnosis not present

## 2020-02-22 DIAGNOSIS — R238 Other skin changes: Secondary | ICD-10-CM | POA: Diagnosis not present

## 2020-02-22 DIAGNOSIS — M25552 Pain in left hip: Secondary | ICD-10-CM | POA: Diagnosis not present

## 2020-02-22 DIAGNOSIS — Z170 Estrogen receptor positive status [ER+]: Secondary | ICD-10-CM

## 2020-02-22 DIAGNOSIS — M542 Cervicalgia: Secondary | ICD-10-CM | POA: Diagnosis not present

## 2020-02-22 DIAGNOSIS — R5383 Other fatigue: Secondary | ICD-10-CM | POA: Diagnosis not present

## 2020-02-22 DIAGNOSIS — H532 Diplopia: Secondary | ICD-10-CM | POA: Diagnosis not present

## 2020-02-22 DIAGNOSIS — C50812 Malignant neoplasm of overlapping sites of left female breast: Secondary | ICD-10-CM

## 2020-02-22 DIAGNOSIS — R634 Abnormal weight loss: Secondary | ICD-10-CM | POA: Diagnosis not present

## 2020-03-02 ENCOUNTER — Encounter: Payer: Self-pay | Admitting: Oncology

## 2020-03-05 ENCOUNTER — Telehealth: Payer: Self-pay

## 2020-03-05 NOTE — Telephone Encounter (Signed)
RN placed call to Dr. Gavin Pound office to request labs and notes faxed for MD to review.

## 2020-03-12 ENCOUNTER — Other Ambulatory Visit: Payer: Self-pay | Admitting: Rheumatology

## 2020-03-12 DIAGNOSIS — M899 Disorder of bone, unspecified: Secondary | ICD-10-CM

## 2020-03-15 ENCOUNTER — Encounter: Payer: Self-pay | Admitting: Oncology

## 2020-03-16 ENCOUNTER — Encounter: Payer: Self-pay | Admitting: Oncology

## 2020-03-16 ENCOUNTER — Other Ambulatory Visit: Payer: Self-pay | Admitting: *Deleted

## 2020-03-16 DIAGNOSIS — R700 Elevated erythrocyte sedimentation rate: Secondary | ICD-10-CM | POA: Diagnosis not present

## 2020-03-16 DIAGNOSIS — R634 Abnormal weight loss: Secondary | ICD-10-CM | POA: Diagnosis not present

## 2020-03-16 DIAGNOSIS — R5383 Other fatigue: Secondary | ICD-10-CM | POA: Diagnosis not present

## 2020-03-16 DIAGNOSIS — M542 Cervicalgia: Secondary | ICD-10-CM | POA: Diagnosis not present

## 2020-03-21 DIAGNOSIS — H5709 Other anomalies of pupillary function: Secondary | ICD-10-CM | POA: Diagnosis not present

## 2020-03-21 DIAGNOSIS — H4942 Progressive external ophthalmoplegia, left eye: Secondary | ICD-10-CM | POA: Diagnosis not present

## 2020-03-26 ENCOUNTER — Other Ambulatory Visit: Payer: Self-pay | Admitting: Ophthalmology

## 2020-03-26 DIAGNOSIS — H4942 Progressive external ophthalmoplegia, left eye: Secondary | ICD-10-CM

## 2020-04-02 ENCOUNTER — Other Ambulatory Visit: Payer: Self-pay | Admitting: Oncology

## 2020-04-02 DIAGNOSIS — C50912 Malignant neoplasm of unspecified site of left female breast: Secondary | ICD-10-CM

## 2020-04-03 ENCOUNTER — Encounter: Payer: Self-pay | Admitting: Oncology

## 2020-04-04 DIAGNOSIS — H5700 Unspecified anomaly of pupillary function: Secondary | ICD-10-CM | POA: Diagnosis not present

## 2020-04-04 DIAGNOSIS — H4942 Progressive external ophthalmoplegia, left eye: Secondary | ICD-10-CM | POA: Diagnosis not present

## 2020-04-07 ENCOUNTER — Other Ambulatory Visit: Payer: Self-pay

## 2020-04-07 ENCOUNTER — Ambulatory Visit
Admission: RE | Admit: 2020-04-07 | Discharge: 2020-04-07 | Disposition: A | Discharge status: Home / Self Care | Payer: BC Managed Care – PPO | Source: Ambulatory Visit | Attending: Rheumatology | Admitting: Rheumatology

## 2020-04-07 DIAGNOSIS — D649 Anemia, unspecified: Secondary | ICD-10-CM | POA: Diagnosis not present

## 2020-04-07 DIAGNOSIS — R200 Anesthesia of skin: Secondary | ICD-10-CM | POA: Diagnosis not present

## 2020-04-07 DIAGNOSIS — M47812 Spondylosis without myelopathy or radiculopathy, cervical region: Secondary | ICD-10-CM | POA: Diagnosis not present

## 2020-04-07 DIAGNOSIS — Z853 Personal history of malignant neoplasm of breast: Secondary | ICD-10-CM | POA: Diagnosis not present

## 2020-04-07 DIAGNOSIS — M899 Disorder of bone, unspecified: Secondary | ICD-10-CM

## 2020-04-07 MED ORDER — GADOBENATE DIMEGLUMINE 529 MG/ML IV SOLN
17.0000 mL | Freq: Once | INTRAVENOUS | Status: AC | PRN
  Administered 2020-04-07: 17 mL via INTRAVENOUS

## 2020-04-12 ENCOUNTER — Other Ambulatory Visit: Payer: Self-pay | Admitting: Oncology

## 2020-04-12 DIAGNOSIS — H532 Diplopia: Secondary | ICD-10-CM | POA: Diagnosis not present

## 2020-04-12 DIAGNOSIS — R700 Elevated erythrocyte sedimentation rate: Secondary | ICD-10-CM | POA: Diagnosis not present

## 2020-04-12 DIAGNOSIS — G43809 Other migraine, not intractable, without status migrainosus: Secondary | ICD-10-CM | POA: Diagnosis not present

## 2020-04-12 DIAGNOSIS — M542 Cervicalgia: Secondary | ICD-10-CM | POA: Diagnosis not present

## 2020-04-12 NOTE — Progress Notes (Signed)
I reviewed the MRI of the cervical spine obtained by Dr. Lenna Gilford.  Quite aside from the spondylosis and C7 foraminal narrowing, there was extensive mottling and heterogeneous appearance of the bone marrow.  This is concerning.  I called [name] and let her know that the appropriate thing to do at this point is a bone marrow biopsy.  I discussed the procedure with her.  She is agreeable.  We will try to get that scheduled within the next 2 weeks.

## 2020-04-13 ENCOUNTER — Other Ambulatory Visit: Payer: Self-pay

## 2020-04-13 ENCOUNTER — Encounter: Payer: Self-pay | Admitting: Oncology

## 2020-04-13 ENCOUNTER — Ambulatory Visit
Admission: RE | Admit: 2020-04-13 | Discharge: 2020-04-13 | Disposition: A | Discharge status: Home / Self Care | Payer: BC Managed Care – PPO | Source: Ambulatory Visit | Attending: Ophthalmology | Admitting: Ophthalmology

## 2020-04-13 DIAGNOSIS — H4942 Progressive external ophthalmoplegia, left eye: Secondary | ICD-10-CM

## 2020-04-13 DIAGNOSIS — D649 Anemia, unspecified: Secondary | ICD-10-CM

## 2020-04-13 DIAGNOSIS — H05222 Edema of left orbit: Secondary | ICD-10-CM | POA: Diagnosis not present

## 2020-04-13 MED ORDER — GADOBENATE DIMEGLUMINE 529 MG/ML IV SOLN
17.0000 mL | Freq: Once | INTRAVENOUS | Status: AC | PRN
  Administered 2020-04-13: 17 mL via INTRAVENOUS

## 2020-04-13 NOTE — Progress Notes (Signed)
Patient scheduled for bone marrow biopsy on 12/21 per MD request, to be performed by Dr. Lindi Adie.        RN notified cytometry and patient is aware.   All questions were answered, no further needs at this time.

## 2020-04-15 ENCOUNTER — Encounter: Payer: Self-pay | Admitting: Oncology

## 2020-04-17 ENCOUNTER — Inpatient Hospital Stay: Payer: BC Managed Care – PPO | Attending: Oncology | Admitting: Hematology and Oncology

## 2020-04-17 ENCOUNTER — Other Ambulatory Visit: Payer: Self-pay

## 2020-04-17 ENCOUNTER — Encounter: Payer: Self-pay | Admitting: Oncology

## 2020-04-17 ENCOUNTER — Inpatient Hospital Stay: Payer: BC Managed Care – PPO

## 2020-04-17 VITALS — BP 123/77 | HR 95 | Temp 99.0°F | Resp 18

## 2020-04-17 DIAGNOSIS — D649 Anemia, unspecified: Secondary | ICD-10-CM | POA: Insufficient documentation

## 2020-04-17 DIAGNOSIS — C773 Secondary and unspecified malignant neoplasm of axilla and upper limb lymph nodes: Secondary | ICD-10-CM

## 2020-04-17 DIAGNOSIS — C50812 Malignant neoplasm of overlapping sites of left female breast: Secondary | ICD-10-CM | POA: Insufficient documentation

## 2020-04-17 DIAGNOSIS — C50912 Malignant neoplasm of unspecified site of left female breast: Secondary | ICD-10-CM | POA: Diagnosis not present

## 2020-04-17 MED ORDER — LIDOCAINE HCL 2 % IJ SOLN
INTRAMUSCULAR | Status: AC
  Filled 2020-04-17: qty 20

## 2020-04-17 NOTE — Patient Instructions (Signed)
Bone Marrow Aspiration and Bone Marrow Biopsy, Adult, Care After °This sheet gives you information about how to care for yourself after your procedure. Your health care provider may also give you more specific instructions. If you have problems or questions, contact your health care provider. °What can I expect after the procedure? °After the procedure, it is common to have: °· Mild pain and tenderness. °· Swelling. °· Bruising. °Follow these instructions at home: °Puncture site care ° °· Follow instructions from your health care provider about how to take care of the puncture site. Make sure you: °? Wash your hands with soap and water before and after you change your bandage (dressing). If soap and water are not available, use hand sanitizer. °? Change your dressing as told by your health care provider. °· Check your puncture site every day for signs of infection. Check for: °? More redness, swelling, or pain. °? Fluid or blood. °? Warmth. °? Pus or a bad smell. °Activity °· Return to your normal activities as told by your health care provider. Ask your health care provider what activities are safe for you. °· Do not lift anything that is heavier than 10 lb (4.5 kg), or the limit that you are told, until your health care provider says that it is safe. °· Do not drive for 24 hours if you were given a sedative during your procedure. °General instructions ° °· Take over-the-counter and prescription medicines only as told by your health care provider. °· Do not take baths, swim, or use a hot tub until your health care provider approves. Ask your health care provider if you may take showers. You may only be allowed to take sponge baths. °· If directed, put ice on the affected area. To do this: °? Put ice in a plastic bag. °? Place a towel between your skin and the bag. °? Leave the ice on for 20 minutes, 2-3 times a day. °· Keep all follow-up visits as told by your health care provider. This is important. °Contact a  health care provider if: °· Your pain is not controlled with medicine. °· You have a fever. °· You have more redness, swelling, or pain around the puncture site. °· You have fluid or blood coming from the puncture site. °· Your puncture site feels warm to the touch. °· You have pus or a bad smell coming from the puncture site. °Summary °· After the procedure, it is common to have mild pain, tenderness, swelling, and bruising. °· Follow instructions from your health care provider about how to take care of the puncture site and what activities are safe for you. °· Take over-the-counter and prescription medicines only as told by your health care provider. °· Contact a health care provider if you have any signs of infection, such as fluid or blood coming from the puncture site. °This information is not intended to replace advice given to you by your health care provider. Make sure you discuss any questions you have with your health care provider. °Document Revised: 08/31/2018 Document Reviewed: 08/31/2018 °Elsevier Patient Education © 2020 [employer]. ° °

## 2020-04-17 NOTE — Progress Notes (Signed)
° °  Bone Marrow Biopsy and Aspiration Procedure Note   Informed consent was obtained and potential risks including bleeding, infection and pain were reviewed with the patient.  The patient's name, date of birth, identification, consent and allergies were verified prior to the start of procedure and time out was performed.  The left posterior iliac crest was chosen as the site of biopsy.  The skin was prepped with ChloraPrep.   14 cc of 1% lidocaine was used to provide local anaesthesia.   In spite of multiple attempts I could not penetrate the cortex of her bone with the bone marrow biopsy needle.    COMPLICATIONS: None BLOOD LOSS: none The patient was discharged home in stable condition with a recommendation to get the bone marrow biopsy done through interventional etiology.  I informed Dr. Jana Hakim about it we will arrange for the intervention radiologist for the bone marrow biopsy.     Signed Stacy Ohara, MD

## 2020-04-17 NOTE — Progress Notes (Signed)
Patient was observed for 30 minutes post procedure. Vitals were WDL and the bandage was clean, dry and intact. Patient denied pain at site and was stable upon departure of infusion clinic.   Patient reported that she had fatigue which is not relieved with rest and states she has a history of anemia. She is requesting a blood transfusion prior to next bone marrow biopsy attempt. This RN messaged Val, RN, Dr. Magrinat's nurse regarding options and for patient to be contacted later today. This RN communicated this with the patient and patient verbalized understanding.  

## 2020-04-18 ENCOUNTER — Other Ambulatory Visit: Payer: Self-pay | Admitting: Radiology

## 2020-04-18 ENCOUNTER — Other Ambulatory Visit: Payer: Self-pay | Admitting: *Deleted

## 2020-04-18 ENCOUNTER — Other Ambulatory Visit: Payer: Self-pay | Admitting: Oncology

## 2020-04-18 DIAGNOSIS — C50912 Malignant neoplasm of unspecified site of left female breast: Secondary | ICD-10-CM

## 2020-04-18 DIAGNOSIS — R700 Elevated erythrocyte sedimentation rate: Secondary | ICD-10-CM

## 2020-04-18 DIAGNOSIS — D649 Anemia, unspecified: Secondary | ICD-10-CM

## 2020-04-18 DIAGNOSIS — C773 Secondary and unspecified malignant neoplasm of axilla and upper limb lymph nodes: Secondary | ICD-10-CM

## 2020-04-19 ENCOUNTER — Telehealth: Payer: Self-pay | Admitting: Oncology

## 2020-04-19 ENCOUNTER — Telehealth: Payer: Self-pay | Admitting: *Deleted

## 2020-04-19 ENCOUNTER — Telehealth: Payer: Self-pay | Admitting: Hematology and Oncology

## 2020-04-19 NOTE — Telephone Encounter (Signed)
No 12/21 los, no changes made to pt schedule  

## 2020-04-19 NOTE — Telephone Encounter (Signed)
Scheduled appt per 12/20 sch msg - pt is aware of appt date and time   

## 2020-04-19 NOTE — OR Nursing (Signed)
Pt called with preprocedure instructions including arrival time of 0730, NPO after MN, and driver for post poceedure

## 2020-04-23 ENCOUNTER — Encounter: Payer: Self-pay | Admitting: Oncology

## 2020-04-23 ENCOUNTER — Other Ambulatory Visit: Payer: Self-pay

## 2020-04-23 ENCOUNTER — Ambulatory Visit
Admission: RE | Admit: 2020-04-23 | Discharge: 2020-04-23 | Disposition: A | Discharge status: Home / Self Care | Payer: BC Managed Care – PPO | Source: Ambulatory Visit | Attending: Oncology | Admitting: Oncology

## 2020-04-23 DIAGNOSIS — Z79899 Other long term (current) drug therapy: Secondary | ICD-10-CM | POA: Diagnosis not present

## 2020-04-23 DIAGNOSIS — Z9013 Acquired absence of bilateral breasts and nipples: Secondary | ICD-10-CM | POA: Insufficient documentation

## 2020-04-23 DIAGNOSIS — C50919 Malignant neoplasm of unspecified site of unspecified female breast: Secondary | ICD-10-CM | POA: Diagnosis not present

## 2020-04-23 DIAGNOSIS — C7952 Secondary malignant neoplasm of bone marrow: Secondary | ICD-10-CM | POA: Diagnosis not present

## 2020-04-23 DIAGNOSIS — Z853 Personal history of malignant neoplasm of breast: Secondary | ICD-10-CM | POA: Diagnosis not present

## 2020-04-23 DIAGNOSIS — D509 Iron deficiency anemia, unspecified: Secondary | ICD-10-CM | POA: Insufficient documentation

## 2020-04-23 DIAGNOSIS — Z809 Family history of malignant neoplasm, unspecified: Secondary | ICD-10-CM | POA: Insufficient documentation

## 2020-04-23 DIAGNOSIS — D6489 Other specified anemias: Secondary | ICD-10-CM | POA: Diagnosis not present

## 2020-04-23 DIAGNOSIS — R9389 Abnormal findings on diagnostic imaging of other specified body structures: Secondary | ICD-10-CM | POA: Diagnosis not present

## 2020-04-23 LAB — CBC WITH DIFFERENTIAL/PLATELET
Abs Immature Granulocytes: 0.05 10*3/uL (ref 0.00–0.07)
Basophils Absolute: 0 10*3/uL (ref 0.0–0.1)
Basophils Relative: 0 %
Eosinophils Absolute: 0 10*3/uL (ref 0.0–0.5)
Eosinophils Relative: 0 %
HCT: 25.4 % — ABNORMAL LOW (ref 36.0–46.0)
Hemoglobin: 7.5 g/dL — ABNORMAL LOW (ref 12.0–15.0)
Immature Granulocytes: 1 %
Lymphocytes Relative: 28 %
Lymphs Abs: 2.5 10*3/uL (ref 0.7–4.0)
MCH: 24.1 pg — ABNORMAL LOW (ref 26.0–34.0)
MCHC: 29.5 g/dL — ABNORMAL LOW (ref 30.0–36.0)
MCV: 81.7 fL (ref 80.0–100.0)
Monocytes Absolute: 0.7 10*3/uL (ref 0.1–1.0)
Monocytes Relative: 8 %
Neutro Abs: 5.6 10*3/uL (ref 1.7–7.7)
Neutrophils Relative %: 63 %
Platelets: 344 10*3/uL (ref 150–400)
RBC: 3.11 MIL/uL — ABNORMAL LOW (ref 3.87–5.11)
RDW: 21.7 % — ABNORMAL HIGH (ref 11.5–15.5)
Smear Review: NORMAL
WBC: 9 10*3/uL (ref 4.0–10.5)
nRBC: 0.3 % — ABNORMAL HIGH (ref 0.0–0.2)

## 2020-04-23 LAB — PROTIME-INR
INR: 1.1 (ref 0.8–1.2)
Prothrombin Time: 13.9 seconds (ref 11.4–15.2)

## 2020-04-23 MED ORDER — MIDAZOLAM HCL 2 MG/2ML IJ SOLN
INTRAMUSCULAR | Status: DC | PRN
  Administered 2020-04-23 (×3): 1 mg via INTRAVENOUS

## 2020-04-23 MED ORDER — MIDAZOLAM HCL 5 MG/5ML IJ SOLN
INTRAMUSCULAR | Status: AC
  Filled 2020-04-23: qty 5

## 2020-04-23 MED ORDER — HEPARIN SOD (PORK) LOCK FLUSH 100 UNIT/ML IV SOLN
INTRAVENOUS | Status: AC
  Filled 2020-04-23: qty 5

## 2020-04-23 MED ORDER — FENTANYL CITRATE (PF) 100 MCG/2ML IJ SOLN
INTRAMUSCULAR | Status: DC | PRN
  Administered 2020-04-23 (×2): 50 ug via INTRAVENOUS

## 2020-04-23 MED ORDER — SODIUM CHLORIDE 0.9 % IV SOLN: INTRAVENOUS | Status: DC

## 2020-04-23 MED ORDER — FENTANYL CITRATE (PF) 100 MCG/2ML IJ SOLN
INTRAMUSCULAR | Status: AC
  Filled 2020-04-23: qty 2

## 2020-04-23 NOTE — Procedures (Signed)
Interventional Radiology Procedure Note  Procedure: CT guided bone marrow aspiration and biopsy  Complications: None  EBL: < 10 mL  Findings: Aspirate and core biopsy performed of bone marrow in right iliac bone.  Plan: Bedrest supine x 1 hrs  Lanard Arguijo T. Zyaira Vejar, M.D Pager:  [phone]   

## 2020-04-23 NOTE — Progress Notes (Signed)
Patient clinically stable post BMB per Dr Fredia Sorrow, tolerated well. Dressing dry/intact to posterior sacral area. Awake/alert and oriented post procedure. Discharge instructions given to patient and husband over phone with questions answered. Denies complaints at this time. Patient stating " this experience with biopsy was AWESOME" . Taking po's without difficulty.

## 2020-04-23 NOTE — Discharge Instructions (Signed)
Bone Marrow Aspiration and Bone Marrow Biopsy, Adult, Care After This sheet gives you information about how to care for yourself after your procedure. Your health care provider may also give you more specific instructions. If you have problems or questions, contact your health care provider. What can I expect after the procedure? After the procedure, it is common to have:  Mild pain and tenderness.  Swelling.  Bruising. Follow these instructions at home: Puncture site care   Follow instructions from your health care provider about how to take care of the puncture site. Make sure you: ? Wash your hands with soap and water before and after you change your bandage (dressing). If soap and water are not available, use hand sanitizer. ? Change your dressing as told by your health care provider.  Check your puncture site every day for signs of infection. Check for: ? More redness, swelling, or pain. ? Fluid or blood. ? Warmth. ? Pus or a bad smell. Activity  Return to your normal activities as told by your health care provider. Ask your health care provider what activities are safe for you.  Do not lift anything that is heavier than 10 lb (4.5 kg), or the limit that you are told, until your health care provider says that it is safe.  Do not drive for 24 hours if you were given a sedative during your procedure. General instructions   Take over-the-counter and prescription medicines only as told by your health care provider.  Do not take baths, swim, or use a hot tub until your health care provider approves. Ask your health care provider if you may take showers. You may only be allowed to take sponge baths.  If directed, put ice on the affected area. To do this: ? Put ice in a plastic bag. ? Place a towel between your skin and the bag. ? Leave the ice on for 20 minutes, 2-3 times a day.  Keep all follow-up visits as told by your health care provider. This is important. Contact a  health care provider if:  Your pain is not controlled with medicine.  You have a fever.  You have more redness, swelling, or pain around the puncture site.  You have fluid or blood coming from the puncture site.  Your puncture site feels warm to the touch.  You have pus or a bad smell coming from the puncture site. Summary  After the procedure, it is common to have mild pain, tenderness, swelling, and bruising.  Follow instructions from your health care provider about how to take care of the puncture site and what activities are safe for you.  Take over-the-counter and prescription medicines only as told by your health care provider.  Contact a health care provider if you have any signs of infection, such as fluid or blood coming from the puncture site. This information is not intended to replace advice given to you by your health care provider. Make sure you discuss any questions you have with your health care provider. Document Revised: 08/31/2018 Document Reviewed: 08/31/2018 Elsevier Patient Education  2020 Elsevier Inc.  

## 2020-04-23 NOTE — H&P (Signed)
Chief Complaint: Worsening anemia. Reqyest is for bone marrow biopsy Referring Physician(s): Chauncey Cruel  Supervising Physician: Aletta Edouard  Patient Status: [facility] - Out-pt  History of Present Illness: [name] is a 44 y.o. female History of metastatic breast cancer s/p bilateral mastectomy with worsening anemia and left temporal arterits. Patient had a CT spine performed on 12.11.21 due to arm numbness. CT reads Markedly abnormal and heterogeneous appearance of the visualized bone marrow, new as compared to most recent available MRI from 2016. While these findings could conceivably be related to patients history of anemia with associated reactive marrow, possible diffuse osseous metastatic disease is a concern, and should be considered until proven otherwise. Patient had a failed bone marrow biopsy in the office on 12.21.21. Team is requesting a bone marrow biopsy for further determination of cancer versus autoimmune disorder.   Return precautions and treatment recommendations and follow-up discussed with the patient who is agreeable with the plan.    Past Medical History:  Diagnosis Date   Back muscle spasm    occasional tx with skelaxin   Cancer ([place])    breast ca   GERD (gastroesophageal reflux disease)    Malignant neoplasm of breast (female), unspecified site    Radiation 10/24/13-12/09/13   Left chestwall/supraclav./scar    Past Surgical History:  Procedure Laterality Date   AXILLARY LYMPH NODE DISSECTION Left 06/16/2013   Procedure: LEFT AXILLARY NODE DISSECTION;  Surgeon: Shann Medal, MD;  Location: [facility];  Service: General;  Laterality: Left;   BILATERAL TOTAL MASTECTOMY WITH AXILLARY LYMPH NODE DISSECTION     BREAST IMPLANT REMOVAL Bilateral 03/18/2018   Procedure: REMOVAL BREAST IMPLANTS;  Surgeon: Wallace Going, DO;  Location: [facility];  Service: Plastics;  Laterality: Bilateral;   BREAST SURGERY      CAPSULECTOMY Bilateral 03/18/2018   Procedure: CAPSULECTOMY;  Surgeon: Wallace Going, DO;  Location: [place];  Service: Plastics;  Laterality: Bilateral;   LAPAROSCOPIC BILATERAL SALPINGO OOPHERECTOMY Bilateral 01/25/2014   Procedure: LAPAROSCOPIC BILATERAL SALPINGO OOPHORECTOMY;  Surgeon: Eldred Manges, MD;  Location: [facility];  Service: Gynecology;  Laterality: Bilateral;   LEEP     PLACEMENT OF BREAST IMPLANTS Bilateral 03/18/2018   Procedure: PLACEMENT OF BREAST IMPLANTS;  Surgeon: Wallace Going, DO;  Location: [place];  Service: Plastics;  Laterality: Bilateral;   PORT-A-CATH REMOVAL N/A 01/25/2014   Procedure: REMOVAL PORT-A-CATH;  Surgeon: Alphonsa Overall, MD;  Location: [facility];  Service: General;  Laterality: N/A;   PORTACATH PLACEMENT N/A 06/16/2013   Procedure: POWER PORT PLACEMENT;  Surgeon: Shann Medal, MD;  Location: [facility];  Service: General;  Laterality: N/A;   right leg surgery     broken femur - has rod in leg   TONSILLECTOMY     WISDOM TOOTH EXTRACTION      Allergies: Sulfa antibiotics, Gadolinium derivatives, and Petrolatum  Medications: Prior to Admission medications   Medication Sig Start Date End Date Taking? Authorizing Provider  tamoxifen (NOLVADEX) 20 MG tablet TAKE 1 TABLET BY MOUTH EVERY DAY 02/22/20  Yes Magrinat, Virgie Dad, MD  ibuprofen (ADVIL) 200 MG tablet Take 600 mg by mouth every 6 (six) hours as needed for headache or moderate pain. Patient not taking: Reported on 04/23/2020    [provider]     Family History  Problem Relation Age of Onset   Other Mother 16       glioblastoma; deceased 39   Cancer Mother  Other Father 66       glioblastoma; deceased 16   Cancer Father    Hypertension Other     Social History   Socioeconomic History   Marital status: Married    Spouse name: Not on file   Number of children: 0   Years of education: Not on file   Highest  education level: Not on file  Occupational History    Employer: [employer]  Tobacco Use   Smoking status: Never Smoker   Smokeless tobacco: Never Used  Substance and Sexual Activity   Alcohol use: Yes    Comment: socially wine   Drug use: No   Sexual activity: Yes    Birth control/protection: None  Other Topics Concern   Not on file  Social History Narrative   Not on file   Social Determinants of Health   Financial Resource Strain: Not on file  Food Insecurity: Not on file  Transportation Needs: Not on file  Physical Activity: Not on file  Stress: Not on file  Social Connections: Not on file     Review of Systems: A 12 point ROS discussed and pertinent positives are indicated in the HPI above.  All other systems are negative.  Review of Systems  Constitutional: Negative for fatigue and fever.  HENT: Negative for congestion.   Respiratory: Negative for cough and shortness of breath.   Gastrointestinal: Negative for abdominal pain, diarrhea, nausea and vomiting.  Musculoskeletal: Positive for arthralgias ( right shoulder pain. "arthritis" ).    Vital Signs: BP 124/78    Pulse (!) 101    Temp 100.3 F (37.9 C) (Oral)    Resp 19    Ht 5' 8"  (1.727 m)    Wt 180 lb 12.4 oz (82 kg)    LMP  (Within Years)    SpO2 97%    BMI 27.49 kg/m   Physical Exam Vitals and nursing note reviewed.  Constitutional:      Appearance: She is well-developed.  HENT:     Head: Normocephalic and atraumatic.  Eyes:     Conjunctiva/sclera: Conjunctivae normal.  Cardiovascular:     Rate and Rhythm: Regular rhythm. Tachycardia present.     Heart sounds: Normal heart sounds.  Pulmonary:     Effort: Pulmonary effort is normal.     Breath sounds: Normal breath sounds.  Musculoskeletal:        General: Normal range of motion.     Cervical back: Normal range of motion.  Skin:    General: Skin is warm.  Neurological:     Mental Status: She is alert and oriented to person, place,  and time.     Imaging: MR CERVICAL SPINE W WO CONTRAST  Result Date: 04/08/2020 CLINICAL DATA:  Initial evaluation for bilateral arm numbness after sleeping, evaluate for bone lesion of cervical spine. History of breast cancer in anemia. EXAM: MRI CERVICAL SPINE WITHOUT AND WITH CONTRAST TECHNIQUE: Multiplanar and multiecho pulse sequences of the cervical spine, to include the craniocervical junction and cervicothoracic junction, were obtained without and with intravenous contrast. CONTRAST:  74m MULTIHANCE GADOBENATE DIMEGLUMINE 529 MG/ML IV SOLN COMPARISON:  Comparison made with prior MRI of the left brachial plexus from 2016, as well as PET-CT from 2015 FINDINGS: Alignment: Straightening of the normal cervical lordosis. No listhesis or static subluxation. Vertebrae: Signal intensity throughout the visualized bone marrow is diffusely abnormal. Markedly mottled and heterogeneous appearance on T1 and T2 weighted sequences, with associated heterogeneous areas of intrinsic STIR signal  intensity and enhancement. Evaluation of the visualized cervical and upper thoracic spine as well as the visualized skull base. Vertebral body height maintained without pathologic fracture. No extra osseous soft tissue density or extension. Overall, appearance is markedly changed as compared to previous MRI from 2016. While these findings could conceivably be related to patient's history of anemia, possible diffuse osseous metastatic disease is a concern. Cord: Signal intensity within the cervical spinal cord is normal. No cord signal abnormality or abnormal enhancement. No epidural tumor or mass. Trace prominence of the central canal at the level of C6-T1 noted without frank syrinx. Posterior Fossa, vertebral arteries, paraspinal tissues: Visualized brain and posterior fossa within normal limits. Craniocervical junction within normal limits. Paraspinous soft tissues demonstrate no acute finding. Normal flow voids seen within  the vertebral arteries bilaterally. Mildly prominent node measuring 7 mm in short axis noted at the upper left mediastinum (series 8, image 27). Additionally, there is a prominent right axillary node measuring at the upper limits of normal of 1 cm in short axis with somewhat rounded morphology (series 8, image 27). Additional 9 mm right axillary node noted as well (series 8, image 26). Disc levels: C2-C3: Normal interspace.  No canal or foraminal stenosis. C3-C4: Minimal disc bulge with uncovertebral hypertrophy. No spinal stenosis. Foramina remain patent. C4-C5: Minimal disc bulge with uncovertebral hypertrophy. No spinal stenosis. Foramina remain patent. C5-C6: Diffuse disc bulge with disc desiccation and bilateral uncovertebral hypertrophy. Mild flattening of the ventral thecal sac without significant spinal stenosis or cord deformity. Foramina remain patent. C6-C7: Mild diffuse disc bulge with disc desiccation and bilateral uncovertebral hypertrophy. Posterior disc osteophyte flattens and partially indents the ventral thecal sac without significant spinal stenosis or cord deformity. Mild left C7 foraminal stenosis. Right neural foramen remains patent. C7-T1: Normal interspace. Mild facet hypertrophy. No canal or foraminal stenosis. Visualized upper thoracic spine demonstrates no significant finding. IMPRESSION: 1. Markedly abnormal and heterogeneous appearance of the visualized bone marrow, new as compared to most recent available MRI from 2016. While these findings could conceivably be related to patient's history of anemia with associated reactive marrow, possible diffuse osseous metastatic disease is a concern, and should be considered until proven otherwise. Follow-up examination with PET-CT and/or bone scan recommended for further evaluation. No associated pathologic fracture or other extraosseous extension of tumor. 2. Mildly prominent right axillary nodes measuring up to 1 cm in short access, within  additional mildly prominent 7 mm upper left mediastinal node. Findings are nonspecific, but could also be assessed at follow-up PET-CT. 3. Mild multilevel cervical spondylosis as above without significant spinal stenosis. Mild left C7 foraminal narrowing. No other significant spinal stenosis or foraminal encroachment. These results will be called to the ordering clinician or representative by the Radiologist Assistant, and communication documented in the PACS or [employer]. Electronically Signed   By: Jeannine Boga M.D.   On: 04/08/2020 03:59   MR ORBITS W WO CONTRAST  Result Date: 04/14/2020 CLINICAL DATA:  Blurred vision on the left. History of breast cancer. EXAM: MRI OF THE ORBITS WITHOUT AND WITH CONTRAST TECHNIQUE: Multiplanar, multisequence MR imaging of the orbits was performed both before and after the administration of intravenous contrast. CONTRAST:  68m MULTIHANCE GADOBENATE DIMEGLUMINE 529 MG/ML IV SOLN COMPARISON:  Cervical MRI 04/07/2020 FINDINGS: As seen previously, there is diffusely abnormal marrow throughout the calvarium, skull base and upper cervical spine, worrisome for widespread osseous metastatic disease. I do not see gross compromise at either orbital apex. The right globe is  normal. Right optic nerve is normal. Extraocular muscles on the right are normal. Right lacrimal gland is normal. On the left, the globe itself appears intact. However, there is a large amount of edematous and enhancing tissue surrounding the globe, particularly superior within the orbit. Lacrimal gland cannot be specifically separated. Abnormal edema and enhancement extends back towards the orbital apex. 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 Absent metastatic disease, the appearance could be due to orbital pseudotumor/idiopathic orbital inflammation. I do not see any tumor  within either cavernous sinus region. Visualized brain appears normal, though not completely evaluated. IMPRESSION: 1. Large amount of edematous and enhancing tissue surrounding the left globe, particularly superior within the orbit. Lacrimal gland cannot be specifically separated. Abnormal edema and enhancement extends back towards the orbital apex. 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 Absent metastatic disease, the appearance could be due to orbital pseudotumor/idiopathic orbital inflammation. 2. Diffusely abnormal marrow signal, worrisome for widespread osseous metastatic disease. Electronically Signed   By: Nelson Chimes M.D.   On: 04/14/2020 19:09    Labs:  CBC: Recent Labs    07/25/19 1007 08/03/19 0722 01/30/20 0908 04/23/20 0723  WBC 9.0 7.2 7.9 9.0  HGB 9.1* 8.8* 7.8* 7.5*  HCT 29.2* 28.9* 26.4* 25.4*  PLT 305 359 311 344    COAGS: Recent Labs    04/23/20 0723  INR 1.1    BMP: Recent Labs    08/03/19 0722 01/30/20 0908  NA 143 139  K 4.3 4.6  CL 109 106  CO2 24 27  GLUCOSE 102* 98  BUN 12 11  CALCIUM 10.0 10.3  CREATININE 0.83 0.83  GFRNONAA >60 >60  GFRAA >60 >60    LIVER FUNCTION TESTS: Recent Labs    08/03/19 0722 01/30/20 0908  BILITOT <0.2* 0.3  AST 14* 12*  ALT 13 9  ALKPHOS 85 102  PROT 7.9 7.9  ALBUMIN 3.1* 3.2*   Assessment and Plan: 44 y.o. female outpatient. History of metastatic breast cancer s/p bilateral mastectomy with worsening anemia and left temporal arterits. Patient had a CT spine performed on 12.11.21 due to arm numbness. CT reads Markedly abnormal and heterogeneous appearance of the visualized bone marrow, new as compared to most recent available MRI from 2016. While these findings could conceivably be related to patients history of anemia with associated reactive marrow, possible diffuse osseous  metastatic disease is a concern, and should be considered until proven otherwise. Patient had a failed bone marrow biopsy in the office on 12.21.21. Team is requesting a bone marrow biopsy for further determination of cancer versus autoimmune disorder.  All labs and medications are within acceptable parameters. Allergies in crude sulfa and gadolinium. Patient has been NPO since midnight.   Risks and benefits of bone marrow biopsy was discussed with the patient and/or patient's family including, but not limited to bleeding, infection, damage to adjacent structures or low yield requiring additional tests.  All of the questions were answered and there is agreement to proceed.  Consent signed and in chart.    Thank you for this interesting consult.  I greatly enjoyed meeting [name] and look forward to participating in their care.  A copy of this report was sent to the requesting provider on this date.  Electronically  Signed: Jacqualine Mau, NP 04/23/2020, 8:30 AM   I spent a total of  30 Minutes   in face to face in clinical consultation, greater than 50% of which was counseling/coordinating care for bone marrow biopsy

## 2020-04-24 ENCOUNTER — Other Ambulatory Visit: Payer: Self-pay | Admitting: *Deleted

## 2020-04-24 DIAGNOSIS — C50912 Malignant neoplasm of unspecified site of left female breast: Secondary | ICD-10-CM

## 2020-04-25 ENCOUNTER — Other Ambulatory Visit: Payer: Self-pay | Admitting: *Deleted

## 2020-04-25 ENCOUNTER — Telehealth: Payer: Self-pay | Admitting: *Deleted

## 2020-04-25 DIAGNOSIS — D649 Anemia, unspecified: Secondary | ICD-10-CM

## 2020-04-25 LAB — SURGICAL PATHOLOGY

## 2020-04-25 NOTE — Telephone Encounter (Signed)
This RN spoke with pt today per results of BMBX and need for visit to review with MD due to showing recurrence of her breast cancer. Note this RN had spoken with pt earlier this week due to reading of CT for fluoro guided bone marrow biopsy showing areas in bone of " likely cancer" which she read on my chart and then called this RN.  This RN reviewed her current schedule of lab tomorrow and transfusion on Friday due to anemia.  Informed her Dr Magrinat's NP - Mendel Ryder will see her while she is getting the transfusion to review results and any current needs.  This RN informed her of appointment with Dr Jana Hakim made for 05/01/2020 to discuss results further as well as review treatment choices.  [name] stated she is scheduled to see an eye doctor at [facility]- Dr Jolyn Nap per Dr Katy Fitch on 1/4.  This RN informed pt of discussion with Dr Katy Fitch earlier this week and best plan is to see Dr Jana Hakim for full review of results and how to proceed on 1/4 and this RN will call Dr Earlie Server office to reschedule appointment.  This RN validated concerns she has been having over the past months - with work up now showing recurrence of her cancer. [name] stated appreciation of discussion and support  " that this can be treated."  This RN gave verbal support to pt's statement.  Per end of phone call pt verbalized understanding to call if she has further concerns.  This note will be sent to attending providers for review of phone discussion with the patient.

## 2020-04-26 ENCOUNTER — Other Ambulatory Visit: Payer: Self-pay

## 2020-04-26 ENCOUNTER — Other Ambulatory Visit: Payer: Self-pay | Admitting: *Deleted

## 2020-04-26 ENCOUNTER — Inpatient Hospital Stay: Payer: BC Managed Care – PPO

## 2020-04-26 ENCOUNTER — Telehealth: Payer: Self-pay

## 2020-04-26 DIAGNOSIS — D649 Anemia, unspecified: Secondary | ICD-10-CM | POA: Diagnosis not present

## 2020-04-26 DIAGNOSIS — C50912 Malignant neoplasm of unspecified site of left female breast: Secondary | ICD-10-CM

## 2020-04-26 DIAGNOSIS — C50812 Malignant neoplasm of overlapping sites of left female breast: Secondary | ICD-10-CM | POA: Diagnosis not present

## 2020-04-26 LAB — CMP (CANCER CENTER ONLY)
ALT: 10 U/L (ref 0–44)
AST: 13 U/L — ABNORMAL LOW (ref 15–41)
Albumin: 2.8 g/dL — ABNORMAL LOW (ref 3.5–5.0)
Alkaline Phosphatase: 75 U/L (ref 38–126)
Anion gap: 5 (ref 5–15)
BUN: 11 mg/dL (ref 6–20)
CO2: 27 mmol/L (ref 22–32)
Calcium: 9.9 mg/dL (ref 8.9–10.3)
Chloride: 109 mmol/L (ref 98–111)
Creatinine: 0.76 mg/dL (ref 0.44–1.00)
GFR, Estimated: >60 mL/min (ref >60)
Glucose, Bld: 98 mg/dL (ref 70–99)
Potassium: 4.1 mmol/L (ref 3.5–5.1)
Sodium: 141 mmol/L (ref 135–145)
Total Bilirubin: <0.2 mg/dL — ABNORMAL LOW (ref 0.3–1.2)
Total Protein: 7.5 g/dL (ref 6.5–8.1)

## 2020-04-26 LAB — CBC WITH DIFFERENTIAL (CANCER CENTER ONLY)
Abs Immature Granulocytes: 0.04 10*3/uL (ref 0.00–0.07)
Basophils Absolute: 0 10*3/uL (ref 0.0–0.1)
Basophils Relative: 1 %
Eosinophils Absolute: 0.1 10*3/uL (ref 0.0–0.5)
Eosinophils Relative: 2 %
HCT: 23.4 % — ABNORMAL LOW (ref 36.0–46.0)
Hemoglobin: 6.9 g/dL — CL (ref 12.0–15.0)
Immature Granulocytes: 1 %
Lymphocytes Relative: 39 %
Lymphs Abs: 1.5 10*3/uL (ref 0.7–4.0)
MCH: 24.1 pg — ABNORMAL LOW (ref 26.0–34.0)
MCHC: 29.5 g/dL — ABNORMAL LOW (ref 30.0–36.0)
MCV: 81.8 fL (ref 80.0–100.0)
Monocytes Absolute: 0.3 10*3/uL (ref 0.1–1.0)
Monocytes Relative: 8 %
Neutro Abs: 2 10*3/uL (ref 1.7–7.7)
Neutrophils Relative %: 49 %
Platelet Count: 338 10*3/uL (ref 150–400)
RBC: 2.86 MIL/uL — ABNORMAL LOW (ref 3.87–5.11)
RDW: 21.1 % — ABNORMAL HIGH (ref 11.5–15.5)
WBC Count: 3.9 10*3/uL — ABNORMAL LOW (ref 4.0–10.5)
nRBC: 1.5 % — ABNORMAL HIGH (ref 0.0–0.2)

## 2020-04-26 LAB — PREPARE RBC (CROSSMATCH)

## 2020-04-26 LAB — SAMPLE TO BLOOD BANK

## 2020-04-26 NOTE — Telephone Encounter (Signed)
CRITICAL VALUE STICKER  CRITICAL VALUE: hemoglobin 6.9  RECEIVER (on-site recipient of call): Dorathy Daft, RN  DATE & TIME NOTIFIED: 04/26/20 0836  MD NOTIFIED: Lillard Anes, NP  TIME OF NOTIFICATION: 6378  RESPONSE: NP aware and addressing with blood transfusion to be scheduled

## 2020-04-27 ENCOUNTER — Inpatient Hospital Stay (HOSPITAL_BASED_OUTPATIENT_CLINIC_OR_DEPARTMENT_OTHER): Payer: BC Managed Care – PPO | Admitting: Adult Health

## 2020-04-27 ENCOUNTER — Inpatient Hospital Stay: Payer: BC Managed Care – PPO

## 2020-04-27 VITALS — BP 110/64 | HR 80 | Temp 99.3°F | Resp 18

## 2020-04-27 DIAGNOSIS — C50912 Malignant neoplasm of unspecified site of left female breast: Secondary | ICD-10-CM

## 2020-04-27 DIAGNOSIS — C773 Secondary and unspecified malignant neoplasm of axilla and upper limb lymph nodes: Secondary | ICD-10-CM

## 2020-04-27 DIAGNOSIS — D649 Anemia, unspecified: Secondary | ICD-10-CM | POA: Diagnosis not present

## 2020-04-27 DIAGNOSIS — C50812 Malignant neoplasm of overlapping sites of left female breast: Secondary | ICD-10-CM | POA: Diagnosis not present

## 2020-04-27 LAB — ABO/RH: ABO/RH(D): O POS

## 2020-04-27 MED ORDER — IBUPROFEN 200 MG PO TABS
ORAL_TABLET | ORAL | Status: AC
  Filled 2020-04-27: qty 4

## 2020-04-27 MED ORDER — DIPHENHYDRAMINE HCL 25 MG PO CAPS
ORAL_CAPSULE | ORAL | Status: AC
  Filled 2020-04-27: qty 1

## 2020-04-27 MED ORDER — IBUPROFEN 200 MG PO TABS
800.0000 mg | ORAL_TABLET | Freq: Once | ORAL | Status: AC
  Administered 2020-04-27: 800 mg via ORAL

## 2020-04-27 MED ORDER — SODIUM CHLORIDE 0.9% IV SOLUTION
250.0000 mL | Freq: Once | INTRAVENOUS | Status: AC
  Administered 2020-04-27: 250 mL via INTRAVENOUS
  Filled 2020-04-27: qty 250

## 2020-04-27 MED ORDER — ACETAMINOPHEN 325 MG PO TABS
650.0000 mg | ORAL_TABLET | Freq: Once | ORAL | Status: AC
  Administered 2020-04-27: 650 mg via ORAL

## 2020-04-27 MED ORDER — IBUPROFEN 200 MG PO TABS
ORAL_TABLET | ORAL | Status: AC
  Filled 2020-04-27: qty 1

## 2020-04-27 MED ORDER — ACETAMINOPHEN 325 MG PO TABS
ORAL_TABLET | ORAL | Status: AC
  Filled 2020-04-27: qty 2

## 2020-04-27 MED ORDER — DIPHENHYDRAMINE HCL 25 MG PO CAPS
25.0000 mg | ORAL_CAPSULE | Freq: Once | ORAL | Status: AC
  Administered 2020-04-27: 25 mg via ORAL

## 2020-04-27 NOTE — Progress Notes (Signed)
I met with [name] briefly today to discuss her results from her bone marrow biopsy that demonstrate metastatic breast cancer, similar to her original breast cancer.  She is taking Tamoxifen daily.  I reviewed with her that metastatic breast cancer is not curable, however with recent advances it is more treatable than it was previously.    I reviewed with her that in her case we need more information.    1.  Staging scans with CT chest/abdomen/pelvis, and bone scan.   2. Additional testing on bone marrow  --ER/PR/HER2 receptors  --Caris testing  3. CA 27-29 at her next appointment to evaluate if this is informative or not.    We discussed a couple of potential treatment options that she might expect if her cancer is indeed similar to her original breast cancer that the pathologists suggested.    1. Fulvestrant: a shot in each buttock every 4 weeks  2. Anastrozole: a pill different than the tamoxifen that blocks the estrogens from her fat cells and adrenal glands. --She has undergone BSO.  3. Palbocicilb: This pill partners with the anti estrogen therapy to help it work better.    We will get more data, and a baseline, and she will meet with Dr. Jana Hakim on 05/02/2019 to review her treatment options.     Wilber Bihari, NP 04/27/20 3:40 PM Medical Oncology and Hematology [facility] [place], [place] [address] Tel. [phone]    Fax. [phone]

## 2020-04-27 NOTE — Progress Notes (Signed)
At the end of the blood transfusion, vital signs were stable but patient complained of throbbing headache 9 out of 10 between her eyes.  Called Lillard Anes, NP and she ordered 800 mg of ibuprofen orally.  Order entered after repeated back and ibuprofen given.  Will continue to monitor until he headache subsides. Lorayne Marek

## 2020-04-27 NOTE — Patient Instructions (Signed)

## 2020-04-28 ENCOUNTER — Encounter: Payer: Self-pay | Admitting: Oncology

## 2020-04-29 LAB — TYPE AND SCREEN
ABO/RH(D): O POS
Antibody Screen: NEGATIVE
Unit division: 0
Unit division: 0

## 2020-04-29 LAB — BPAM RBC
Blood Product Expiration Date: 202201202359
Blood Product Expiration Date: 202201282359
ISSUE DATE / TIME: 202112310854
ISSUE DATE / TIME: 202112310854
Unit Type and Rh: 5100
Unit Type and Rh: 5100

## 2020-04-30 ENCOUNTER — Encounter: Payer: Self-pay | Admitting: Oncology

## 2020-04-30 MED ORDER — METHYLPREDNISOLONE 4 MG PO TBPK: ORAL_TABLET | ORAL | 0 refills | Status: DC

## 2020-04-30 NOTE — Telephone Encounter (Signed)
This RN contacted Dr Gentry Roch- neuro- ophthalmologist to reschedule appointment on 05/02/2019 due to conversation with Dr Dione Booze as well as need for MD to see pt to review new recurrence and treatment plan.  Appointment rescheduled ( to keep pt in their books if needed ) for 07/26/2020 at 10 am.

## 2020-05-01 ENCOUNTER — Other Ambulatory Visit (HOSPITAL_COMMUNITY): Payer: Self-pay | Admitting: Oncology

## 2020-05-01 ENCOUNTER — Encounter: Payer: Self-pay | Admitting: Oncology

## 2020-05-01 ENCOUNTER — Other Ambulatory Visit: Payer: Self-pay

## 2020-05-01 ENCOUNTER — Inpatient Hospital Stay: Payer: BC Managed Care – PPO | Attending: Oncology | Admitting: Oncology

## 2020-05-01 VITALS — BP 133/86 | HR 113 | Temp 97.4°F | Resp 20 | Ht 68.0 in | Wt 180.0 lb

## 2020-05-01 DIAGNOSIS — Z7952 Long term (current) use of systemic steroids: Secondary | ICD-10-CM | POA: Insufficient documentation

## 2020-05-01 DIAGNOSIS — R519 Headache, unspecified: Secondary | ICD-10-CM | POA: Diagnosis not present

## 2020-05-01 DIAGNOSIS — Z9013 Acquired absence of bilateral breasts and nipples: Secondary | ICD-10-CM | POA: Insufficient documentation

## 2020-05-01 DIAGNOSIS — Z90722 Acquired absence of ovaries, bilateral: Secondary | ICD-10-CM | POA: Diagnosis not present

## 2020-05-01 DIAGNOSIS — C787 Secondary malignant neoplasm of liver and intrahepatic bile duct: Secondary | ICD-10-CM | POA: Diagnosis not present

## 2020-05-01 DIAGNOSIS — I890 Lymphedema, not elsewhere classified: Secondary | ICD-10-CM | POA: Diagnosis not present

## 2020-05-01 DIAGNOSIS — C50812 Malignant neoplasm of overlapping sites of left female breast: Secondary | ICD-10-CM | POA: Diagnosis not present

## 2020-05-01 DIAGNOSIS — Z808 Family history of malignant neoplasm of other organs or systems: Secondary | ICD-10-CM | POA: Insufficient documentation

## 2020-05-01 DIAGNOSIS — Z9079 Acquired absence of other genital organ(s): Secondary | ICD-10-CM | POA: Insufficient documentation

## 2020-05-01 DIAGNOSIS — Z923 Personal history of irradiation: Secondary | ICD-10-CM | POA: Insufficient documentation

## 2020-05-01 DIAGNOSIS — H538 Other visual disturbances: Secondary | ICD-10-CM | POA: Diagnosis not present

## 2020-05-01 DIAGNOSIS — M47892 Other spondylosis, cervical region: Secondary | ICD-10-CM | POA: Diagnosis not present

## 2020-05-01 DIAGNOSIS — C50912 Malignant neoplasm of unspecified site of left female breast: Secondary | ICD-10-CM | POA: Diagnosis not present

## 2020-05-01 DIAGNOSIS — Z9221 Personal history of antineoplastic chemotherapy: Secondary | ICD-10-CM | POA: Diagnosis not present

## 2020-05-01 DIAGNOSIS — C7951 Secondary malignant neoplasm of bone: Secondary | ICD-10-CM | POA: Insufficient documentation

## 2020-05-01 DIAGNOSIS — C7952 Secondary malignant neoplasm of bone marrow: Secondary | ICD-10-CM | POA: Diagnosis not present

## 2020-05-01 DIAGNOSIS — Z170 Estrogen receptor positive status [ER+]: Secondary | ICD-10-CM | POA: Diagnosis not present

## 2020-05-01 DIAGNOSIS — D649 Anemia, unspecified: Secondary | ICD-10-CM | POA: Diagnosis not present

## 2020-05-01 DIAGNOSIS — Z79811 Long term (current) use of aromatase inhibitors: Secondary | ICD-10-CM | POA: Diagnosis not present

## 2020-05-01 DIAGNOSIS — C773 Secondary and unspecified malignant neoplasm of axilla and upper limb lymph nodes: Secondary | ICD-10-CM | POA: Diagnosis not present

## 2020-05-01 MED ORDER — ANASTROZOLE 1 MG PO TABS: 1.0000 mg | ORAL_TABLET | Freq: Every day | ORAL | 4 refills | Status: DC

## 2020-05-01 MED ORDER — PALBOCICLIB 125 MG PO CAPS: 125.0000 mg | ORAL_CAPSULE | Freq: Every day | ORAL | 6 refills | Status: DC

## 2020-05-01 MED ORDER — DIAZEPAM 5 MG PO TABS: ORAL_TABLET | ORAL | 0 refills | Status: DC

## 2020-05-01 NOTE — Progress Notes (Signed)
[place]  [phone] Fax:([phone]) [phone]      ID: [name] OB: [birth year]  MR#: [id number]  GGY#:[id number]  Patient Care Team: Maurice Small, MD as PCP - General (Family Medicine) Latonia Conrow, Virgie Dad, MD as Consulting Physician (Oncology) Alphonsa Overall, MD as Consulting Physician (General Surgery) Gavin Pound, MD as Consulting Physician (Rheumatology) Katy Fitch, Darlina Guys, MD as Consulting Physician (Ophthalmology) OTHER MD: Annette Stable [phone])  CHIEF COMPLAINT:  Stage IV breast cancer, estrogen receptor positive  CURRENT TREATMENT: To start anastrozole, palbociclib, denosumab   INTERVAL HISTORY: [name] returns today for follow-up of her now-metastatic estrogen receptor positive breast cancer.   She continues on tamoxifen.  She tolerates this with no significant side effects.  However we are stopping that medication as of today given evidence of disease progression  Since her last visit, she underwent cervical spine MRI on 04/07/2020 to evaluate for bilateral arm numbness. This showed: markedly abnormal and heterogeneous appearance of visualized bone marrow; mildly-prominent right axillary nodes measuring up to 1 cm, within additionally mildly-prominent 7 mm upper left mediastinal node; mild multilevel cervical spondylosis.  She also underwent orbits MRI on 04/13/2020 to evaluate for left-sided blurred vision. This showed: large amount of edematous and enhancing tissue surrounding left globe, particularly superior within orbit; lacrimal gland cannot be specifically separated; abnormal edema and enhancement extends back towards orbital apex; diffusely abnormal marrow signal.  She proceeded to bone marrow biopsy on 04/23/2020. Pathology from the procedure ([id number]) revealed: variably cellular marrow with metastatic carcinoma, with features most consistent with previously-known breast primary.  The cancer cells are estrogen and  progesterone receptor positive by immunohistochemistry.  Orders are in place for restaging bone scan and CT chest/abdomen/pelvis, but these have not yet been scheduled.   REVIEW OF SYSTEMS: [name] still has blurred vision in her left eye and diplopia when she looks at either extreme right or left.  She has no pain associated with this but she does have headaches which she says are really not like her usual headache more like a tension in her scalp like she can feel the veins she says.  Importantly when Dr. Trudie Reed started her on steroids (for unrelated reasons) the headaches immediately felt better.  She also felt very weak when she was severely anemic but after her recent transfusion she has had more energy and has been able to work she says.  There has been no nausea or vomiting, no gait imbalance, and no falls.   COVID 19 VACCINATION STATUS: Not vaccinated as of 05/01/2020   BREAST CANCER HISTORY:  From the original intake note:  [name] had bilateral diagnostic mammography [facility] in [place] 11/30/2009 showing a 1.5 cm mass at the 9:00 position of the left breast with some satellites. A biopsy of the mass in question [facility] showed ([id number]) and invasive lobular carcinoma, which was estrogen and progesterone receptor both 100% positive, both with strong staining intensity, but HER-2 negative at 1+. Bilateral breast MRI 12/13/2009 in [place] showed in the left breast an irregularly marginated mass measuring 3.2 cm. There were 4 or 5 separate satellite lesions laterally and superior to the primary.  Accordingly, after appropriate discussion, the patient underwent bilateral mastectomies with left sentinel lymph node sampling 01/02/2010. The right breast was benign. The left breast showed a 2.3 cm invasive lobular carcinoma, grade 3, with a total of 6 sentinel lymph nodes removed, all negative, although 2 showed isolated tumor cells by immunostaining.  Margins were  negative.  An Oncotype BX was sent, with a recurrence score of 22, predicting a risk of distant recurrence within 10 years with 14% of the patients only systemic treatment was tamoxifen. The patient was treated with CMF chemotherapy between 02/22/2010 and 07/05/2010, receiving 7 cycles at which point the patient refused further chemotherapy though there have been no major toxicities at according to the oncology note from Dr. Brent General. The patient was then started on tamoxifen 08/09/2010. By her cancer took approximately 12-15 months then stopped because of hot flashes and insomnia problems.  More recently, the patient noted a change in her left axilla andunderwent bilateral breast MRI January to 2015 at [facility]. The patient has bilateral submuscular silicone implants in place. The breast were unremarkable, but there were several lymph nodes with cortical thickening in the left axilla including a dominant 1 measuring 1.2 cm in the short axis. Ultrasonography of this area identified the lymph node in question and the patient underwent biopsy of the left axillary lymph node This showed ([id number]) near-total replacement of the lymph node by metastatic carcinoma. This was 96% estrogen receptor positive, and 84% progesterone receptor positive, both with strong staining intensity. The MIB-1 was 20%. HER-2 determination is pending.  The patient's subsequent history is as detailed below   PAST MEDICAL HISTORY: Past Medical History:  Diagnosis Date  . Back muscle spasm    occasional tx with skelaxin  . Cancer ([place])    breast ca  . GERD (gastroesophageal reflux disease)   . Malignant neoplasm of breast (female), unspecified site   . Radiation 10/24/13-12/09/13   Left chestwall/supraclav./scar    PAST SURGICAL HISTORY: Past Surgical History:  Procedure Laterality Date  . AXILLARY LYMPH NODE DISSECTION Left 06/16/2013   Procedure: LEFT AXILLARY NODE DISSECTION;  Surgeon: Shann Medal, MD;  Location: [facility];  Service: General;  Laterality: Left;  . BILATERAL TOTAL MASTECTOMY WITH AXILLARY LYMPH NODE DISSECTION    . BREAST IMPLANT REMOVAL Bilateral 03/18/2018   Procedure: REMOVAL BREAST IMPLANTS;  Surgeon: Wallace Going, DO;  Location: [facility];  Service: Plastics;  Laterality: Bilateral;  . BREAST SURGERY    . CAPSULECTOMY Bilateral 03/18/2018   Procedure: CAPSULECTOMY;  Surgeon: Wallace Going, DO;  Location: [facility];  Service: Plastics;  Laterality: Bilateral;  . LAPAROSCOPIC BILATERAL SALPINGO OOPHERECTOMY Bilateral 01/25/2014   Procedure: LAPAROSCOPIC BILATERAL SALPINGO OOPHORECTOMY;  Surgeon: Eldred Manges, MD;  Location: [facility];  Service: Gynecology;  Laterality: Bilateral;  . LEEP    . PLACEMENT OF BREAST IMPLANTS Bilateral 03/18/2018   Procedure: PLACEMENT OF BREAST IMPLANTS;  Surgeon: Wallace Going, DO;  Location: [facility];  Service: Plastics;  Laterality: Bilateral;  . PORT-A-CATH REMOVAL N/A 01/25/2014   Procedure: REMOVAL PORT-A-CATH;  Surgeon: Alphonsa Overall, MD;  Location: [facility];  Service: General;  Laterality: N/A;  . PORTACATH PLACEMENT N/A 06/16/2013   Procedure: POWER PORT PLACEMENT;  Surgeon: Shann Medal, MD;  Location: [facility];  Service: General;  Laterality: N/A;  . right leg surgery     broken femur - has rod in leg  . TONSILLECTOMY    . WISDOM TOOTH EXTRACTION      FAMILY HISTORY Family History  Problem Relation Age of Onset  . Other Mother 47       glioblastoma; deceased 58  . Cancer Mother   . Other Father 22       glioblastoma; deceased 18  . Cancer Father   .  Hypertension Other    according to the patient both her parents died from a primary brain cancers, namely we have the stomas, her father at 1, her mother at 47. The patient had one brother and one sister. There is no history of breast or ovarian cancer in the family   GYNECOLOGIC HISTORY:   (Reviewed  10/10/2013) Menarche age 2. The patient has never carried a child to term. She was still having regular periods at the time of her recent diagnosis. LMP 07/18/2013 as of 09/08/2013.  The patient took oral contraceptives between the ages of 84 and 6 with no complications. She stopped having periods with her chemotherapy in 2015.    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 She currently works as a Research scientist (physical sciences). She shares a home with her significant other, Burnice Logan, who works at [employer] and also in [place]: Not in [place]:  Social History   Tobacco Use  . Smoking status: Never Smoker  . Smokeless tobacco: Never Used  Substance Use Topics  . Alcohol use: Yes    Comment: socially wine  . Drug use: No     Colonoscopy: Never  PAP: December 2014  Bone density: Never  Lipid panel: Not on file   Allergies  Allergen Reactions  . Sulfa Antibiotics Hives and Itching  . Gadolinium Derivatives Hives    After gado injection, pt had a few small hives on left breast area. Treated with benadryl by dr Janeece Fitting   . Petrolatum Hives and Rash    Current Outpatient Medications  Medication Sig Dispense Refill  . anastrozole (ARIMIDEX) 1 MG tablet Take 1 tablet (1 mg total) by mouth daily. 90 tablet 4  . diazepam (VALIUM) 5 MG tablet Take one tablet by mouth just before radiology study; may repeat once. 10 tablet 0  . palbociclib (IBRANCE) 125 MG capsule Take 1 capsule (125 mg total) by mouth daily with breakfast. Take whole with food. Take for 21 days on, 7 days off, repeat every 28 days. 21 capsule 6  . ibuprofen (ADVIL) 200 MG tablet Take 600 mg by mouth every 6 (six) hours as needed for headache or moderate pain. (Patient not taking: Reported on 04/23/2020)    . methylPREDNISolone (MEDROL DOSEPAK) 4 MG  TBPK tablet Take as directed on package 21 tablet 0   No current facility-administered medications for this visit.    OBJECTIVE: African-American woman who appears stated age  21:   05/01/20 1457  BP: 133/86  Pulse: (!) 113  Resp: 20  Temp: (!) 97.4 F (36.3 C)  SpO2: 99%   Wt Readings from Last 3 Encounters:  05/01/20 180 lb (81.6 kg)  04/23/20 180 lb 12.4 oz (82 kg)  01/30/20 182 lb (82.6 kg)   Body mass index is 27.37 kg/m.    ECOG FS:1 - Symptomatic but completely ambulatory  Sclerae unicteric, some ptosis on the left with mild periorbital edema but no erythema Wearing a mask Lungs no rales or rhonchi Heart regular rate and rhythm Abd soft, nontender, positive bowel sounds MSK no focal spinal tenderness, no upper extremity lymphedema Neuro: nonfocal, well oriented, appropriate affect Breasts: Status post bilateral mastectomies. Skin: She has multiple small subcutaneous palpable nodules which are firm, not erythematous or tender, and could well be metastatic deposits  LAB RESULTS: Lab Results  Component Value Date   WBC 3.9 (L) 04/26/2020   NEUTROABS 2.0 04/26/2020   HGB 6.9 (LL) 04/26/2020   HCT 23.4 (L) 04/26/2020   MCV 81.8 04/26/2020   PLT 338 04/26/2020      Chemistry      Component Value Date/Time   NA 141 04/26/2020 0814   NA 141 10/30/2016 0813   K 4.1 04/26/2020 0814   K 4.0 10/30/2016 0813   CL 109 04/26/2020 0814   CO2 27 04/26/2020 0814   CO2 26 10/30/2016 0813   BUN 11 04/26/2020 0814   BUN 15.2 10/30/2016 0813   CREATININE 0.76 04/26/2020 0814   CREATININE 0.8 10/30/2016 0813      Component Value Date/Time   CALCIUM 9.9 04/26/2020 0814   CALCIUM 10.0 10/30/2016 0813   ALKPHOS 75 04/26/2020 0814   ALKPHOS 59 10/30/2016 0813   AST 13 (L) 04/26/2020 0814   AST 17 10/30/2016 0813   ALT 10 04/26/2020 0814   ALT 11 10/30/2016 0813   BILITOT <0.2 (L) 04/26/2020 0814   BILITOT 0.26 10/30/2016 0813      STUDIES: MR CERVICAL  SPINE W WO CONTRAST  Result Date: 04/08/2020 CLINICAL DATA:  Initial evaluation for bilateral arm numbness after sleeping, evaluate for bone lesion of cervical spine. History of breast cancer in anemia. EXAM: MRI CERVICAL SPINE WITHOUT AND WITH CONTRAST TECHNIQUE: Multiplanar and multiecho pulse sequences of the cervical spine, to include the craniocervical junction and cervicothoracic junction, were obtained without and with intravenous contrast. CONTRAST:  57m MULTIHANCE GADOBENATE DIMEGLUMINE 529 MG/ML IV SOLN COMPARISON:  Comparison made with prior MRI of the left brachial plexus from 2016, as well as PET-CT from 2015 FINDINGS: Alignment: Straightening of the normal cervical lordosis. No listhesis or static subluxation. Vertebrae: Signal intensity throughout the visualized bone marrow is diffusely abnormal. Markedly mottled and heterogeneous appearance on T1 and T2 weighted sequences, with associated heterogeneous areas of intrinsic STIR signal intensity and enhancement. Evaluation of the visualized cervical and upper thoracic spine as well as the visualized skull base. Vertebral body height maintained without pathologic fracture. No extra osseous soft tissue density or extension. Overall, appearance is markedly changed as compared to previous MRI from 2016. While these findings could conceivably be related to patient's history of anemia, possible diffuse osseous metastatic disease is a concern. Cord: Signal intensity within the cervical spinal cord is normal. No cord signal abnormality or abnormal enhancement. No epidural tumor or mass. Trace prominence of the central canal at the level of C6-T1 noted without frank syrinx. Posterior Fossa, vertebral arteries, paraspinal tissues: Visualized brain and posterior fossa within normal limits. Craniocervical junction within normal limits. Paraspinous soft tissues demonstrate no acute finding. Normal flow voids seen within the vertebral arteries bilaterally. Mildly  prominent node measuring 7 mm in short axis noted at the upper left mediastinum (series 8, image 27). Additionally, there is a prominent right axillary node measuring at the upper limits of normal of 1 cm in short axis with somewhat rounded morphology (series 8, image 27). Additional 9 mm right axillary node noted as well (series 8, image 26). Disc levels: C2-C3: Normal interspace.  No canal or foraminal stenosis. C3-C4: Minimal disc bulge with uncovertebral hypertrophy. No spinal stenosis. Foramina remain patent. C4-C5: Minimal disc bulge with uncovertebral hypertrophy. No spinal stenosis. Foramina remain patent. C5-C6: Diffuse disc bulge with disc desiccation and bilateral uncovertebral hypertrophy. Mild flattening of the ventral thecal sac without significant spinal stenosis or cord deformity. Foramina remain patent. C6-C7: Mild  diffuse disc bulge with disc desiccation and bilateral uncovertebral hypertrophy. Posterior disc osteophyte flattens and partially indents the ventral thecal sac without significant spinal stenosis or cord deformity. Mild left C7 foraminal stenosis. Right neural foramen remains patent. C7-T1: Normal interspace. Mild facet hypertrophy. No canal or foraminal stenosis. Visualized upper thoracic spine demonstrates no significant finding. IMPRESSION: 1. Markedly abnormal and heterogeneous appearance of the visualized bone marrow, new as compared to most recent available MRI from 2016. While these findings could conceivably be related to patient's history of anemia with associated reactive marrow, possible diffuse osseous metastatic disease is a concern, and should be considered until proven otherwise. Follow-up examination with PET-CT and/or bone scan recommended for further evaluation. No associated pathologic fracture or other extraosseous extension of tumor. 2. Mildly prominent right axillary nodes measuring up to 1 cm in short access, within additional mildly prominent 7 mm upper left  mediastinal node. Findings are nonspecific, but could also be assessed at follow-up PET-CT. 3. Mild multilevel cervical spondylosis as above without significant spinal stenosis. Mild left C7 foraminal narrowing. No other significant spinal stenosis or foraminal encroachment. These results will be called to the ordering clinician or representative by the Radiologist Assistant, and communication documented in the PACS or [employer]. Electronically Signed   By: Jeannine Boga M.D.   On: 04/08/2020 03:59   CT BONE MARROW BIOPSY & ASPIRATION  Result Date: 04/23/2020 CLINICAL DATA:  History of breast carcinoma with clinical suspicion bone/bone marrow involvement. EXAM: CT GUIDED BONE MARROW ASPIRATION AND BIOPSY ANESTHESIA/SEDATION: Versed 3.0 mg IV, Fentanyl 100 mcg IV Total Moderate Sedation Time:   15 minutes. The patient's level of consciousness and physiologic status were continuously monitored during the procedure by Radiology nursing. PROCEDURE: The procedure risks, benefits, and alternatives were explained to the patient. Questions regarding the procedure were encouraged and answered. The patient understands and consents to the procedure. A time out was performed prior to initiating the procedure. CT was performed of the lower lumbar spine and sacrum and bony pelvis in a prone position. The right gluteal region was prepped with chlorhexidine. Sterile gown and sterile gloves were used for the procedure. Local anesthesia was provided with 1% Lidocaine. Under CT guidance, an 11 gauge On Control bone cutting needle was advanced from a posterior approach into the right iliac bone. Needle positioning was confirmed with CT. Initial non heparinized and heparinized aspirate samples were obtained of bone marrow. Core biopsy was performed via the On Control drill needle. COMPLICATIONS: None FINDINGS: Initial imaging demonstrates diffuse abnormal sclerosis involving lower lumbar vertebral bodies, the sacrum  and bilateral bony pelvis consistent with metastatic disease. No pathologic fractures are identified. Inspection of initial aspirate did reveal sparse particles. Intact core biopsy sample was obtained. IMPRESSION: CT guided bone marrow biopsy of right posterior iliac bone with both aspirate and core samples obtained. Imaging demonstrates abnormal sclerosis throughout the visualized lower lumbosacral spine and bony pelvis consistent with involvement by metastatic disease. Electronically Signed   By: Aletta Edouard M.D.   On: 04/23/2020 09:44   MR ORBITS W WO CONTRAST  Result Date: 04/14/2020 CLINICAL DATA:  Blurred vision on the left. History of breast cancer. EXAM: MRI OF THE ORBITS WITHOUT AND WITH CONTRAST TECHNIQUE: Multiplanar, multisequence MR imaging of the orbits was performed both before and after the administration of intravenous contrast. CONTRAST:  73m MULTIHANCE GADOBENATE DIMEGLUMINE 529 MG/ML IV SOLN COMPARISON:  Cervical MRI 04/07/2020 FINDINGS: As seen previously, there is diffusely abnormal marrow throughout the calvarium, skull  base and upper cervical spine, worrisome for widespread osseous metastatic disease. I do not see gross compromise at either orbital apex. The right globe is normal. Right optic nerve is normal. Extraocular muscles on the right are normal. Right lacrimal gland is normal. On the left, the globe itself appears intact. However, there is a large amount of edematous and enhancing tissue surrounding the globe, particularly superior within the orbit. Lacrimal gland cannot be specifically separated. Abnormal edema and enhancement extends back towards the orbital apex. 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 Absent metastatic disease, the appearance could be due to orbital pseudotumor/idiopathic orbital inflammation. I do not see any tumor within either  cavernous sinus region. Visualized brain appears normal, though not completely evaluated. IMPRESSION: 1. Large amount of edematous and enhancing tissue surrounding the left globe, particularly superior within the orbit. Lacrimal gland cannot be specifically separated. Abnormal edema and enhancement extends back towards the orbital apex. 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 Absent metastatic disease, the appearance could be due to orbital pseudotumor/idiopathic orbital inflammation. 2. Diffusely abnormal marrow signal, worrisome for widespread osseous metastatic disease. Electronically Signed   By: Nelson Chimes M.D.   On: 04/14/2020 19:09     ASSESSMENT: 45 y.o. BRCA negative [place] woman  (1) status post bilateral mastectomies with left axillary lymph node sampling [6 nodes removed] 01/02/2010 for a pT2 pN0(i+), stage IIA invasive lobular breast cancer, grade 3, estrogen and progesterone receptor positive, HER-2 not amplified; right breast was benign  (2) Oncotype DX score of 22 predicted a 14% risk of distant recurrence within 10 years if the patient's only systemic treatment is tamoxifen for 5 years  (3) status post CMF(cyclophosphamide, fluorouracil, methotrexate) x7 given between October of 2011 and March of 2012 (7 of 8 planned treatments completed)  (4) tamoxifen started April 2012, discontinued approximately July of 2013 (about 15 months) because of problems with hot flashes and insomnia  (5) pathologically documented left axillary recurrence 05/05/2013, the tumor being again estrogen and progesterone receptor positive, with HER-2 not amplified, and MIB-1 of 15%. Staging CT/PET 05/27/2013 show left axillary and supraclavicular recurrence, no distant disease  (6) status post completion left axillary lymph node dissection 06/16/2013 showing a total of 10/15 lymph  nodes involved by tumor, with evidence of extracapsular extension (TX N3 = stage IIIC)  (7) treated with carboplatin/ docetaxel, first dose on 07/11/2013, repeated every 21 days x 4 with Neulasta support on day 2.  Final dose was given on 09/12/2013, with chemotherapy discontinued after 4 cycles due to continuing low counts.  (8) radiation completed 12/09/2013: Left chest wall / 50.4 Gray @ 1.8 Gray per fraction x 28 fractions Left Supraclavicular fossa and PAB/ 61 Gray @1 .8 Gray per fraction x 25 fractions Left scar / 10 Gray at [employer] per fraction x 5 fractions   (9) bilateral salpingo-oophorectomy on 01/25/14  (10)   lymphedema and limited range of motion in the left upper extremity secondary to left axillary lymph node dissection, being treated through the lymphedema clinic  - improved   (13) sequencing and deletion/duplication analysis of 17 genes performed September 2015 at [facility] showed no deleterious mutations in  ATM, BARD1, BRCA1, BRCA2, BRIP1, CDH1, CHEK2, MRE11A, MUTYH, NBN, NF1, PALB2, PTEN, RAD50, RAD51C, RAD51D, and TP53.  (  14) tamoxifen re-started 02/27/2014, discontinued December 2021 with progression  (15) bilateral breast implant exchange with capsulotomies 03/18/2018 following left implant rupture and bilateral capsular contractures. Left - [employer] Smooth Round Ultra High Profile Gel 590cc. Ref #[id number].  Serial Number [id number] Right - [employer] Smooth Round Ultra High Profile Gel 590cc. Ref #[id number].  Serial Number [id number]  METASTATIC DISEASE: (16) bone marrow biopsy 04/23/2020 shows metastatic carcinoma in the marrow, estrogen and progesterone receptor positive  (a) MRI of the cervical spine 04/07/2020 shows markedly abnormal marrow signal, prominent right axillary lymph nodes, degenerative disc disease  (b) MRI of the orbits 04/13/2020 suggests metastatic disease around the left orbit and diffusely abnormal bone marrow signal  (c) CT scan of the chest,  abdomen and pelvis  (d) bone scan  (e) brain MRI  (17) radiation to the left periorbital region pending  (18) anastrozole started 05/01/2020  (a) palbociclib to be added  (b) denosumab to be added    PLAN: [name] now has pathologically documented metastatic disease.  Even though she had some autoimmune markers these were red herring's and really what she has is metastatic disease in the bone marrow.  This explains the anemia, as well as the diffuse and varied pain she was experiencing.  I am very concerned about the possibility of central nervous system involvement given the left periorbital infiltration and the fact that her very unusual and persistent headache improved significantly on prednisone.  We are trying to get a brain MRI but she tells me she is very claustrophobic.  I wrote her for Valium and instructed her on how to take it so she can get through that test as soon as possible.  She needs to be thoroughly staged and very likely after the CT scans if there is any questionable areas we will proceed to a PET scan since lobular breast cancers, which is what we are dealing with, are very difficult to demonstrate radiographically.  She understands that stage IV breast cancer is not curable with our current knowledge base. The goal of treatment is control. The strategy of treatment is to do only the minimum necessary to control the growth of the tumor so that the patient can have as normal a life as possible. There is no survival advantage in treating aggressively if treating less aggressively results in tumor control. With this strategy stage IV breast cancer in many cases can function as a "chronic illness": something that cannot be quite gotten rid of but can be controlled for an indefinite period of time  Accordingly we are starting anastrozole today.  She has a good understanding of the possible toxicities side effects and complications of this agent.  I also wrote the orders for  palbociclib, which hopefully she can obtain before she sees me later this month.  Once she is fully staged we will also start her on denosumab.  We are also referring her to radiation oncology, with an appointment 05/17/2019 for radiation to the left periorbital area.  Total encounter time 50 minutes.*   Alicja Everitt, Virgie Dad, MD  05/01/20 5:51 PM Medical Oncology and Hematology [facility] [place], [place] [address] Tel. [phone]    Fax. [phone]   I, Wilburn Mylar, am acting as scribe for Dr. Virgie Dad. Lasya Vetter.  I, Lurline Del MD, have reviewed the above documentation for accuracy and completeness, and I agree with the above.   *Total Encounter Time as defined by the Centers for Medicare and Medicaid Services  includes, in addition to the face-to-face time of a patient visit (documented in the note above) non-face-to-face time: obtaining and reviewing outside history, ordering and reviewing medications, tests or procedures, care coordination (communications with other health care professionals or caregivers) and documentation in the medical record.

## 2020-05-02 ENCOUNTER — Telehealth: Payer: Self-pay

## 2020-05-02 ENCOUNTER — Telehealth: Payer: Self-pay | Admitting: Pharmacist

## 2020-05-02 ENCOUNTER — Encounter (HOSPITAL_COMMUNITY): Payer: Self-pay | Admitting: Oncology

## 2020-05-02 ENCOUNTER — Encounter: Payer: Self-pay | Admitting: Oncology

## 2020-05-02 ENCOUNTER — Other Ambulatory Visit: Payer: Self-pay | Admitting: Oncology

## 2020-05-02 DIAGNOSIS — C50912 Malignant neoplasm of unspecified site of left female breast: Secondary | ICD-10-CM

## 2020-05-02 DIAGNOSIS — C773 Secondary and unspecified malignant neoplasm of axilla and upper limb lymph nodes: Secondary | ICD-10-CM

## 2020-05-02 MED ORDER — PALBOCICLIB 125 MG PO TABS: 125.0000 mg | ORAL_TABLET | Freq: Every day | ORAL | 6 refills | Status: DC

## 2020-05-02 NOTE — Telephone Encounter (Signed)
Oral Oncology Patient Advocate Encounter  Prior Authorization for Ilda Foil has been approved.    PA# [id number] Effective dates: 05/02/20 through 05/01/21  Patient must use [facility]  Oral Oncology Clinic will continue to follow.    Cruzita Lederer CPHT Specialty Pharmacy Patient Advocate [facility] Phone [phone] Fax [phone] 05/02/2020 10:05 AM

## 2020-05-02 NOTE — Telephone Encounter (Signed)
Oral Oncology Patient Advocate Encounter  Received notification from [facility] that prior authorization for Ilda Foil is required.  PA submitted on CoverMyMeds Key [id number] Status is pending  Oral Oncology Clinic will continue to follow.  Cruzita Lederer CPHT Specialty Pharmacy Patient Advocate [facility] Phone [phone] Fax [phone] 05/02/2020 9:08 AM

## 2020-05-02 NOTE — Telephone Encounter (Addendum)
Oral Oncology Pharmacist Encounter  Received new prescription for Ibrance (palbociclib) for the treatment of metastatic breast cancer in conjunction with anastrozole, planned duration until disease progression or unacceptable drug toxicity.  Prescription dose and frequency assessed.   CMP and CBC w/ Diff from 04/26/20 assessed, noted Hgb 6.9 g/dL (patient given 2 units PRBC on 04/26/20) - recommend continue monitoring while on therapy.  Current medication list in Epic reviewed, no relevant/significant DDIs with Ibrance identified.  Evaluated chart and no patient barriers to medication adherence noted.   Patient's insurance requires that Ibrance be filled through [employer]. Prescription redirected.  Oral Oncology Clinic will continue to follow for insurance authorization, copayment issues, initial counseling and start date.  Lenord Carbo, PharmD, BCPS Hematology/Oncology Clinical Pharmacist [facility] Oral Chemotherapy Navigation Clinic [phone] 05/02/2020 9:31 AM

## 2020-05-02 NOTE — Telephone Encounter (Signed)
Pt called with concerns of MRI being scheduled at [facility] -  Pt reports in past she has had to use [facility] due to larger machines to help with claustrophobia.   RN collaborated with PA department, PA department able to provide authorization at [employer].   Pt aware, phone number provided for patient to call scheduling per request.

## 2020-05-03 ENCOUNTER — Other Ambulatory Visit: Payer: Self-pay | Admitting: Oncology

## 2020-05-03 DIAGNOSIS — C7951 Secondary malignant neoplasm of bone: Secondary | ICD-10-CM | POA: Insufficient documentation

## 2020-05-03 DIAGNOSIS — Z7189 Other specified counseling: Secondary | ICD-10-CM

## 2020-05-06 ENCOUNTER — Encounter: Payer: Self-pay | Admitting: Oncology

## 2020-05-07 ENCOUNTER — Other Ambulatory Visit: Payer: Self-pay | Admitting: Oncology

## 2020-05-07 ENCOUNTER — Encounter: Payer: Self-pay | Admitting: Oncology

## 2020-05-07 MED ORDER — DEXAMETHASONE 4 MG PO TABS: 4.0000 mg | ORAL_TABLET | Freq: Every day | ORAL | 1 refills | Status: DC

## 2020-05-07 NOTE — Progress Notes (Signed)
We had a call from [name] 05/06/2020 letting us know that after she completed her steroids 4 days prior her headache recurred and it is fairly constant.  She wondered if she needed to cancel her appointment with Dr. 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  I am starting her on Decadron 4 mg daily and asked her to call us later today or tomorrow to let us know if this is working as far as relieving the headache is concerned.  Her brain MRI scheduled 05/13/2020, and we will try to move it up a bit.  She has a radiation oncology appointment 05/16/2020.  I think she can postpone the repeat visit with Dr. Carolynn Sayers until after she completes the periorbital radiation therapy.  I dictated a letter regarding her immunocompromised and we are both mailing it to her and putting it in her "my chart" for her to use at her discretion.

## 2020-05-08 ENCOUNTER — Encounter: Payer: Self-pay | Admitting: Oncology

## 2020-05-08 NOTE — Telephone Encounter (Signed)
Oral Chemotherapy Pharmacist Encounter  Successfully signed the patient up for a copay card for Ibrance.  ID: [id number] BIN: [id number] Group: [id number] Exp: 04/27/2021  Billing information shared with [facility]. Patient's out of pocket cost for Leslee Home will be $0.  Called and informed patient of above information. She expressed appreciation. Patient informed [facility] will be reaching out to set up medication shipment.   Leron Croak, PharmD, BCPS Hematology/Oncology Clinical Pharmacist [facility] [phone] 05/08/2020 4:42 PM

## 2020-05-09 ENCOUNTER — Ambulatory Visit
Admission: RE | Admit: 2020-05-09 | Discharge: 2020-05-09 | Disposition: A | Discharge status: Home / Self Care | Payer: BC Managed Care – PPO | Source: Ambulatory Visit | Attending: Oncology | Admitting: Oncology

## 2020-05-09 ENCOUNTER — Encounter: Payer: Self-pay | Admitting: Oncology

## 2020-05-09 ENCOUNTER — Other Ambulatory Visit: Payer: Self-pay | Admitting: Oncology

## 2020-05-09 ENCOUNTER — Other Ambulatory Visit: Payer: Self-pay

## 2020-05-09 DIAGNOSIS — C50912 Malignant neoplasm of unspecified site of left female breast: Secondary | ICD-10-CM

## 2020-05-09 DIAGNOSIS — Z170 Estrogen receptor positive status [ER+]: Secondary | ICD-10-CM

## 2020-05-09 DIAGNOSIS — C50812 Malignant neoplasm of overlapping sites of left female breast: Secondary | ICD-10-CM

## 2020-05-09 MED ORDER — GADOBENATE DIMEGLUMINE 529 MG/ML IV SOLN
16.0000 mL | Freq: Once | INTRAVENOUS | Status: AC | PRN
  Administered 2020-05-09: 16 mL via INTRAVENOUS

## 2020-05-10 ENCOUNTER — Ambulatory Visit (HOSPITAL_COMMUNITY)
Admission: RE | Admit: 2020-05-10 | Discharge: 2020-05-10 | Disposition: A | Discharge status: Home / Self Care | Payer: BC Managed Care – PPO | Source: Ambulatory Visit | Attending: Adult Health | Admitting: Adult Health

## 2020-05-10 ENCOUNTER — Encounter: Payer: Self-pay | Admitting: Oncology

## 2020-05-10 DIAGNOSIS — C773 Secondary and unspecified malignant neoplasm of axilla and upper limb lymph nodes: Secondary | ICD-10-CM | POA: Diagnosis not present

## 2020-05-10 DIAGNOSIS — C50912 Malignant neoplasm of unspecified site of left female breast: Secondary | ICD-10-CM

## 2020-05-10 DIAGNOSIS — C50919 Malignant neoplasm of unspecified site of unspecified female breast: Secondary | ICD-10-CM | POA: Diagnosis not present

## 2020-05-10 DIAGNOSIS — C7951 Secondary malignant neoplasm of bone: Secondary | ICD-10-CM | POA: Diagnosis not present

## 2020-05-10 MED ORDER — IOHEXOL 300 MG/ML  SOLN
100.0000 mL | Freq: Once | INTRAMUSCULAR | Status: AC | PRN
  Administered 2020-05-10: 100 mL via INTRAVENOUS

## 2020-05-10 MED ORDER — TECHNETIUM TC 99M MEDRONATE IV KIT: 20.0000 | PACK | Freq: Once | INTRAVENOUS | Status: DC | PRN

## 2020-05-10 NOTE — Progress Notes (Signed)
[place]  [phone] [phone]) [phone]      ID: [name] OB: Mar 12, 1976  MR#: [id number]  WNU#:[id number]  Patient Care Team: Maurice Small, MD as PCP - General (Family Medicine) Jacqulyn Barresi, Virgie Dad, MD as Consulting Physician (Oncology) Alphonsa Overall, MD as Consulting Physician (General Surgery) Gavin Pound, MD as Consulting Physician (Rheumatology) Katy Fitch, Darlina Guys, MD as Consulting Physician (Ophthalmology) Gavin Pound, MD as Consulting Physician (Rheumatology) OTHER MD: Annette Stable [phone])  CHIEF COMPLAINT:  Stage IV breast cancer, estrogen receptor positive (s/p bilateral mastectomies)  CURRENT TREATMENT: To start anastrozole, palbociclib, denosumab   INTERVAL HISTORY: [name] returns today for follow-up of her now-metastatic estrogen receptor positive breast cancer.   Since her last visit, she underwent brain MRI on 05/09/2020 showing: no evidence of intracranial metastatic disease or acute intracranial abnormality; redemonstrated abnormal enhancing tissue surrounding left globe and left lacrimal gland with extension posteriorly; similar nonspecific diffuse marked T1 hypointensity of marrow.  She also underwent restaging CT and bone scan yesterday, 05/10/2020. CT chest/abdomen/pelvis showed: findings consistent with widespread nodal, hepatic, and osseous metastatic disease, which is new in comparison to most recent imaging of body in 03/2014-- numerous bulky right axillary, subpectoral, celiac axis, and portacaval lymph nodes; numerous enlarged prevascular, pretracheal, subcarinal, right hilar, and bilateral iliac lymph nodes; numerous prominent retroperitoneal lymph nodes; multiple hypodense liver masses; diffusely sclerotic osseous metastatic disease throughout axial skeleton and included proximal femurs.  Bone scan showed: widespread osseous metastatic disease; likely postsurgical changes related to hardware in right  femur.  We started her on anastrozole at her last visit on 05/01/2020.  She was also prescribed palbociclib at that visit. She was delayed in getting this medication due to cost, but it is scheduled to arrive today for her.   REVIEW OF SYSTEMS: [name] has a severe headache associated with her periorbital tumor.  Her vision is also affected.  We have increased the steroids to 4 mg twice daily but that really has not done it.  She has been taking Motrin and Tylenol with very little relief.  She has not been able to sleep.  She denies nausea.  There have been no intercurrent falls.  She denies cough phlegm production or pleurisy.  She is slightly constipated at present.   COVID 19 VACCINATION STATUS: Not vaccinated as of 05/01/2020   BREAST CANCER HISTORY:  From the original intake note:  [name] had bilateral diagnostic mammography [facility] in [place] 11/30/2009 showing a 1.5 cm mass at the 9:00 position of the left breast with some satellites. A biopsy of the mass in question [facility] showed ([id number]) and invasive lobular carcinoma, which was estrogen and progesterone receptor both 100% positive, both with strong staining intensity, but HER-2 negative at 1+. Bilateral breast MRI 12/13/2009 in [place] showed in the left breast an irregularly marginated mass measuring 3.2 cm. There were 4 or 5 separate satellite lesions laterally and superior to the primary.  Accordingly, after appropriate discussion, the patient underwent bilateral mastectomies with left sentinel lymph node sampling 01/02/2010. The right breast was benign. The left breast showed a 2.3 cm invasive lobular carcinoma, grade 3, with a total of 6 sentinel lymph nodes removed, all negative, although 2 showed isolated tumor cells by immunostaining. Margins were negative.  An Oncotype BX was sent, with a recurrence score of 22, predicting a risk of distant recurrence within 10 years with 14% of  the patients only systemic treatment was tamoxifen. The patient was  treated with CMF chemotherapy between 02/22/2010 and 07/05/2010, receiving 7 cycles at which point the patient refused further chemotherapy though there have been no major toxicities at according to the oncology note from Dr. Brent General. The patient was then started on tamoxifen 08/09/2010. By her cancer took approximately 12-15 months then stopped because of hot flashes and insomnia problems.  More recently, the patient noted a change in her left axilla andunderwent bilateral breast MRI January to 2015 at [place]. The patient has bilateral submuscular silicone implants in place. The breast were unremarkable, but there were several lymph nodes with cortical thickening in the left axilla including a dominant 1 measuring 1.2 cm in the short axis. Ultrasonography of this area identified the lymph node in question and the patient underwent biopsy of the left axillary lymph node This showed ([id number]) near-total replacement of the lymph node by metastatic carcinoma. This was 96% estrogen receptor positive, and 84% progesterone receptor positive, both with strong staining intensity. The MIB-1 was 20%. HER-2 determination is pending.  The patient's subsequent history is as detailed below   PAST MEDICAL HISTORY: Past Medical History:  Diagnosis Date  . Back muscle spasm    occasional tx with skelaxin  . Cancer ([place])    breast ca  . GERD (gastroesophageal reflux disease)   . Malignant neoplasm of breast (female), unspecified site   . Radiation 10/24/13-12/09/13   Left chestwall/supraclav./scar    PAST SURGICAL HISTORY: Past Surgical History:  Procedure Laterality Date  . AXILLARY LYMPH NODE DISSECTION Left 06/16/2013   Procedure: LEFT AXILLARY NODE DISSECTION;  Surgeon: Shann Medal, MD;  Location: [facility];  Service: General;  Laterality: Left;  . BILATERAL TOTAL MASTECTOMY WITH AXILLARY LYMPH NODE DISSECTION    . BREAST  IMPLANT REMOVAL Bilateral 03/18/2018   Procedure: REMOVAL BREAST IMPLANTS;  Surgeon: Wallace Going, DO;  Location: [facility];  Service: Plastics;  Laterality: Bilateral;  . BREAST SURGERY    . CAPSULECTOMY Bilateral 03/18/2018   Procedure: CAPSULECTOMY;  Surgeon: Wallace Going, DO;  Location: [facility];  Service: Plastics;  Laterality: Bilateral;  . LAPAROSCOPIC BILATERAL SALPINGO OOPHERECTOMY Bilateral 01/25/2014   Procedure: LAPAROSCOPIC BILATERAL SALPINGO OOPHORECTOMY;  Surgeon: Eldred Manges, MD;  Location: [facility];  Service: Gynecology;  Laterality: Bilateral;  . LEEP    . PLACEMENT OF BREAST IMPLANTS Bilateral 03/18/2018   Procedure: PLACEMENT OF BREAST IMPLANTS;  Surgeon: Wallace Going, DO;  Location: [facility];  Service: Plastics;  Laterality: Bilateral;  . PORT-A-CATH REMOVAL N/A 01/25/2014   Procedure: REMOVAL PORT-A-CATH;  Surgeon: Alphonsa Overall, MD;  Location: [facility];  Service: General;  Laterality: N/A;  . PORTACATH PLACEMENT N/A 06/16/2013   Procedure: POWER PORT PLACEMENT;  Surgeon: Shann Medal, MD;  Location: [facility];  Service: General;  Laterality: N/A;  . right leg surgery     broken femur - has rod in leg  . TONSILLECTOMY    . WISDOM TOOTH EXTRACTION      FAMILY HISTORY Family History  Problem Relation Age of Onset  . Other Mother 84       glioblastoma; deceased 41  . Cancer Mother   . Other Father 85       glioblastoma; deceased 9  . Cancer Father   . Hypertension Other    according to the patient both her parents died from a primary brain cancers, namely we have the stomas, her father at 48, her mother at 72. The patient  had one brother and one sister. There is no history of breast or ovarian cancer in the family   GYNECOLOGIC HISTORY:   (Reviewed 10/10/2013) Menarche age 44. The patient has never carried a child to term. She was still having regular periods at the time of her recent  diagnosis. LMP 07/18/2013 as of 09/08/2013.  The patient took oral contraceptives between the ages of 84 and 58 with no complications. She stopped having periods with her chemotherapy in 2015.    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 She currently works as a Research scientist (physical sciences). She shares a home with her significant other, Burnice Logan, who works at [employer] and also in [place]: Not in [place]:  Social History   Tobacco Use  . Smoking status: Never Smoker  . Smokeless tobacco: Never Used  Substance Use Topics  . Alcohol use: Yes    Comment: socially wine  . Drug use: No     Colonoscopy: Never  PAP: December 2014  Bone density: Never  Lipid panel: Not on file   Allergies  Allergen Reactions  . Sulfa Antibiotics Hives and Itching  . Gadolinium Derivatives Hives    After gado injection, pt had a few small hives on left breast area. Treated with benadryl by dr Janeece Fitting   . Petrolatum Hives and Rash    Current Outpatient Medications  Medication Sig Dispense Refill  . anastrozole (ARIMIDEX) 1 MG tablet Take 1 tablet (1 mg total) by mouth daily. 90 tablet 4  . dexamethasone (DECADRON) 4 MG tablet Take 1 tablet (4 mg total) by mouth daily. 60 tablet 1  . diazepam (VALIUM) 5 MG tablet Take one tablet by mouth just before radiology study; may repeat once. 10 tablet 0  . ibuprofen (ADVIL) 200 MG tablet Take 600 mg by mouth every 6 (six) hours as needed for headache or moderate pain. (Patient not taking: Reported on 04/23/2020)    . methylPREDNISolone (MEDROL DOSEPAK) 4 MG TBPK tablet Take as directed on package 21 tablet 0  . palbociclib (IBRANCE) 125 MG tablet Take 1 tablet (125 mg total) by mouth daily. Take for 21 days on, 7 days off, repeat every 28 days. 21 tablet 6   No current  facility-administered medications for this visit.   Facility-Administered Medications Ordered in Other Visits  Medication Dose Route Frequency Provider Last Rate Last Admin  . technetium medronate (TC-MDP) injection 20 millicurie  20 millicurie Intravenous Once PRN Earle Gell, MD        OBJECTIVE: African-American woman who is in poorly controlled pain  Vitals:   05/11/20 0950  BP: 120/75  Pulse: 98  Resp: 18  Temp: 98.1 F (36.7 C)  SpO2: 100%   Wt Readings from Last 3 Encounters:  05/11/20 178 lb 8 oz (81 kg)  05/01/20 180 lb (81.6 kg)  04/23/20 180 lb 12.4 oz (82 kg)   Body mass index is 27.14 kg/m.    ECOG FS:1 - Symptomatic but completely ambulatory  Left eyelid ptotic Wearing a mask Lungs no rales or rhonchi Heart regular rate and rhythm Abd soft, nontender, positive bowel sounds MSK no focal spinal tenderness, no upper extremity lymphedema Neuro: nonfocal, well oriented, appropriate affect Breasts: Deferred   LAB RESULTS: Lab Results  Component Value Date   WBC 11.8 (H) 05/11/2020   NEUTROABS  8.4 (H) 05/11/2020   HGB 9.7 (L) 05/11/2020   HCT 30.7 (L) 05/11/2020   MCV 81.0 05/11/2020   PLT 313 05/11/2020      Chemistry      Component Value Date/Time   NA 141 04/26/2020 0814   NA 141 10/30/2016 0813   K 4.1 04/26/2020 0814   K 4.0 10/30/2016 0813   CL 109 04/26/2020 0814   CO2 27 04/26/2020 0814   CO2 26 10/30/2016 0813   BUN 11 04/26/2020 0814   BUN 15.2 10/30/2016 0813   CREATININE 0.76 04/26/2020 0814   CREATININE 0.8 10/30/2016 0813      Component Value Date/Time   CALCIUM 9.9 04/26/2020 0814   CALCIUM 10.0 10/30/2016 0813   ALKPHOS 75 04/26/2020 0814   ALKPHOS 59 10/30/2016 0813   AST 13 (L) 04/26/2020 0814   AST 17 10/30/2016 0813   ALT 10 04/26/2020 0814   ALT 11 10/30/2016 0813   BILITOT <0.2 (L) 04/26/2020 0814   BILITOT 0.26 10/30/2016 0813      STUDIES: CT Chest W Contrast  Result Date: 05/10/2020 CLINICAL DATA:  Breast  cancer restaging, recent bone marrow biopsy, left orbital mass EXAM: CT CHEST, ABDOMEN, AND PELVIS WITH CONTRAST TECHNIQUE: Multidetector CT imaging of the chest, abdomen and pelvis was performed following the standard protocol during bolus administration of intravenous contrast. CONTRAST:  133m OMNIPAQUE IOHEXOL 300 MG/ML SOLN, additional oral enteric contrast COMPARISON:  PET-CT, 04/12/2014 FINDINGS: CT CHEST FINDINGS Cardiovascular: No significant vascular findings. Normal heart size. No pericardial effusion. Mediastinum/Nodes: Numerous bulky right axillary and subpectoral lymph nodes, largest measuring 2.8 x 1.7 cm (series 2, image 20). Numerous enlarged prevascular, pretracheal, subcarinal, and right hilar lymph nodes, largest right hilar node measuring 1.7 x 1.2 cm (series 2, image 32). Thyroid gland, trachea, and esophagus demonstrate no significant findings. Lungs/Pleura: Minimal subpleural radiation fibrosis of the anterior right middle lobe (series 4, image 107) and left upper lobe (series 4, image 58). There is a new subpleural nodule of the azygoesophageal recess of the right lower lobe measuring 5 mm (series 4, image 92). No pleural effusion or pneumothorax. Musculoskeletal: No chest wall mass. Status post bilateral mastectomy with implant reconstruction. Diffuse sclerotic osseous metastatic disease throughout the axial skeleton. CT ABDOMEN PELVIS FINDINGS Hepatobiliary: Multiple hypodense liver masses, largest a bulky mass of the liver dome measuring 7.0 x 6.6 cm (series 2, image 51) no gallstones, gallbladder wall thickening, or biliary dilatation. Pancreas: Unremarkable. No pancreatic ductal dilatation or surrounding inflammatory changes. Spleen: Normal in size without significant abnormality. Adrenals/Urinary Tract: Adrenal glands are unremarkable. Kidneys are normal, without renal calculi, solid lesion, or hydronephrosis. Bladder is unremarkable. Stomach/Bowel: Stomach is within normal limits.  Appendix appears normal. No evidence of bowel wall thickening, distention, or inflammatory changes. Vascular/Lymphatic: No significant vascular findings are present. Bulky celiac axis and portacaval lymph nodes, difficult to distinguish from adjacent liver and pancreatic parenchyma, measuring up to 4.0 x 2.8 cm (series 2, image 62). Enlarged bilateral iliac lymph nodes, largest right external iliac node measuring 2.3 x 1.3 cm (series 2, image 103). Numerous additional prominent retroperitoneal lymph nodes. Reproductive: Uterine fibroids. Other: No abdominal wall hernia or abnormality. No abdominopelvic ascites. Musculoskeletal: Diffusely sclerotic osseous metastatic disease throughout the axial skeleton and included proximal femurs. Partially imaged intramedullary rod fixation of the right femoral diaphysis. IMPRESSION: 1. Numerous bulky right axillary and subpectoral lymph nodes in addition to numerous enlarged prevascular, pretracheal, subcarinal, and right hilar lymph nodes. 2. Bulky celiac axis and  portacaval lymph nodes. Enlarged bilateral iliac lymph nodes and numerous additional prominent retroperitoneal lymph nodes. 3. Multiple hypodense liver masses. 4. Diffusely sclerotic osseous metastatic disease throughout the axial skeleton and included proximal femurs. 5. Constellation of findings above is consistent with widespread nodal, hepatic, and osseous metastatic disease, which is new in comparison to most recent imaging of the body dated 04/12/2014. 6. Status post bilateral mastectomy and implant reconstruction Electronically Signed   By: Eddie Candle M.D.   On: 05/10/2020 09:43   MR Brain W Wo Contrast  Result Date: 05/09/2020 CLINICAL DATA:  Breast cancer, staging. EXAM: MRI HEAD WITHOUT AND WITH CONTRAST TECHNIQUE: Multiplanar, multiecho pulse sequences of the brain and surrounding structures were obtained without and with intravenous contrast. CONTRAST:  79m MULTIHANCE GADOBENATE DIMEGLUMINE 529  MG/ML IV SOLN COMPARISON:  MRI of the orbits from December 17, 21. CT head from Sep 07, 2019. FINDINGS: Brain: No acute infarction, hemorrhage, hydrocephalus, extra-axial collection or mass lesion. No abnormal of enhancement. Mild T2/FLAIR hyperintensity within the white matter, within normal limits for age. Very mild diffuse dural prominence is favored within normal limits given the absence of focal nodularity, subjacent brain edema, or evidence of intracranial hypertension. Vascular: Major flow voids are maintained at the skull base. Skull and upper cervical spine: Similar diffuse marked T1 hypointensity of the marrow. Sinuses/Orbits: Redemonstrated abnormal enhancing tissue surrounding the left globe and left lacrimal gland with extension posteriorly, better characterized on recent MRI of the orbits from 04/13/2020. Other: No mastoid effusions. IMPRESSION: 1. No evidence of intracranial metastatic disease or acute intracranial abnormality. 2. Redemonstrated abnormal enhancing tissue surrounding the left globe and left lacrimal gland with extension posteriorly, concerning for metastatic disease given the clinical history and better characterized on recent dedicated MRI of the orbits. 3. Similar diffuse marked T1 hypointensity of the marrow, nonspecific but concerning for diffuse osseous metastatic disease. Electronically Signed   By: FMargaretha SheffieldMD   On: 05/09/2020 13:40   NM Bone Scan Whole Body  Result Date: 05/10/2020 CLINICAL DATA:  Metastatic breast cancer, restaging, LEFT orbital mass prior rodding of RIGHT leg EXAM: NUCLEAR MEDICINE WHOLE BODY BONE SCAN TECHNIQUE: Whole body anterior and posterior images were obtained approximately 3 hours after intravenous injection of radiopharmaceutical. RADIOPHARMACEUTICALS:  22 mCi Technetium-964mDP IV COMPARISON:  None Radiographic correlation: CT chest abdomen pelvis 05/10/2020 FINDINGS: Uptake in mid and proximal RIGHT femur corresponding to IM nail on  CT. Subtle focus of cortical uptake at distal RIGHT femur could potentially represent a distal locking screw but the distal aspect of the hardware is not imaged by CT to confirm hardware configuration. Mild uptake at shoulders, sternoclavicular joints, feet, typically degenerative. Mottled uptake throughout the pelvis, spine, sternum, and ribs, corresponding to sclerotic osseous metastases on CT. Focal increased uptake RIGHT mandible, most often related to dental disease but metastatic disease not excluded. Expected urinary tract and soft tissue distribution of tracer. IMPRESSION: Likely postsurgical changes of related to hardware in the RIGHT femur. Widespread osseous metastatic disease as above. Electronically Signed   By: MaLavonia Dana.D.   On: 05/10/2020 14:37   CT Abdomen Pelvis W Contrast  Result Date: 05/10/2020 CLINICAL DATA:  Breast cancer restaging, recent bone marrow biopsy, left orbital mass EXAM: CT CHEST, ABDOMEN, AND PELVIS WITH CONTRAST TECHNIQUE: Multidetector CT imaging of the chest, abdomen and pelvis was performed following the standard protocol during bolus administration of intravenous contrast. CONTRAST:  10052mMNIPAQUE IOHEXOL 300 MG/ML SOLN, additional oral enteric contrast COMPARISON:  PET-CT, 04/12/2014 FINDINGS: CT CHEST FINDINGS Cardiovascular: No significant vascular findings. Normal heart size. No pericardial effusion. Mediastinum/Nodes: Numerous bulky right axillary and subpectoral lymph nodes, largest measuring 2.8 x 1.7 cm (series 2, image 20). Numerous enlarged prevascular, pretracheal, subcarinal, and right hilar lymph nodes, largest right hilar node measuring 1.7 x 1.2 cm (series 2, image 32). Thyroid gland, trachea, and esophagus demonstrate no significant findings. Lungs/Pleura: Minimal subpleural radiation fibrosis of the anterior right middle lobe (series 4, image 107) and left upper lobe (series 4, image 58). There is a new subpleural nodule of the azygoesophageal  recess of the right lower lobe measuring 5 mm (series 4, image 92). No pleural effusion or pneumothorax. Musculoskeletal: No chest wall mass. Status post bilateral mastectomy with implant reconstruction. Diffuse sclerotic osseous metastatic disease throughout the axial skeleton. CT ABDOMEN PELVIS FINDINGS Hepatobiliary: Multiple hypodense liver masses, largest a bulky mass of the liver dome measuring 7.0 x 6.6 cm (series 2, image 51) no gallstones, gallbladder wall thickening, or biliary dilatation. Pancreas: Unremarkable. No pancreatic ductal dilatation or surrounding inflammatory changes. Spleen: Normal in size without significant abnormality. Adrenals/Urinary Tract: Adrenal glands are unremarkable. Kidneys are normal, without renal calculi, solid lesion, or hydronephrosis. Bladder is unremarkable. Stomach/Bowel: Stomach is within normal limits. Appendix appears normal. No evidence of bowel wall thickening, distention, or inflammatory changes. Vascular/Lymphatic: No significant vascular findings are present. Bulky celiac axis and portacaval lymph nodes, difficult to distinguish from adjacent liver and pancreatic parenchyma, measuring up to 4.0 x 2.8 cm (series 2, image 62). Enlarged bilateral iliac lymph nodes, largest right external iliac node measuring 2.3 x 1.3 cm (series 2, image 103). Numerous additional prominent retroperitoneal lymph nodes. Reproductive: Uterine fibroids. Other: No abdominal wall hernia or abnormality. No abdominopelvic ascites. Musculoskeletal: Diffusely sclerotic osseous metastatic disease throughout the axial skeleton and included proximal femurs. Partially imaged intramedullary rod fixation of the right femoral diaphysis. IMPRESSION: 1. Numerous bulky right axillary and subpectoral lymph nodes in addition to numerous enlarged prevascular, pretracheal, subcarinal, and right hilar lymph nodes. 2. Bulky celiac axis and portacaval lymph nodes. Enlarged bilateral iliac lymph nodes and  numerous additional prominent retroperitoneal lymph nodes. 3. Multiple hypodense liver masses. 4. Diffusely sclerotic osseous metastatic disease throughout the axial skeleton and included proximal femurs. 5. Constellation of findings above is consistent with widespread nodal, hepatic, and osseous metastatic disease, which is new in comparison to most recent imaging of the body dated 04/12/2014. 6. Status post bilateral mastectomy and implant reconstruction Electronically Signed   By: Eddie Candle M.D.   On: 05/10/2020 09:43   CT BONE MARROW BIOPSY & ASPIRATION  Result Date: 04/23/2020 CLINICAL DATA:  History of breast carcinoma with clinical suspicion bone/bone marrow involvement. EXAM: CT GUIDED BONE MARROW ASPIRATION AND BIOPSY ANESTHESIA/SEDATION: Versed 3.0 mg IV, Fentanyl 100 mcg IV Total Moderate Sedation Time:   15 minutes. The patient's level of consciousness and physiologic status were continuously monitored during the procedure by Radiology nursing. PROCEDURE: The procedure risks, benefits, and alternatives were explained to the patient. Questions regarding the procedure were encouraged and answered. The patient understands and consents to the procedure. A time out was performed prior to initiating the procedure. CT was performed of the lower lumbar spine and sacrum and bony pelvis in a prone position. The right gluteal region was prepped with chlorhexidine. Sterile gown and sterile gloves were used for the procedure. Local anesthesia was provided with 1% Lidocaine. Under CT guidance, an 11 gauge On Control bone cutting needle was advanced from  a posterior approach into the right iliac bone. Needle positioning was confirmed with CT. Initial non heparinized and heparinized aspirate samples were obtained of bone marrow. Core biopsy was performed via the On Control drill needle. COMPLICATIONS: None FINDINGS: Initial imaging demonstrates diffuse abnormal sclerosis involving lower lumbar vertebral bodies,  the sacrum and bilateral bony pelvis consistent with metastatic disease. No pathologic fractures are identified. Inspection of initial aspirate did reveal sparse particles. Intact core biopsy sample was obtained. IMPRESSION: CT guided bone marrow biopsy of right posterior iliac bone with both aspirate and core samples obtained. Imaging demonstrates abnormal sclerosis throughout the visualized lower lumbosacral spine and bony pelvis consistent with involvement by metastatic disease. Electronically Signed   By: Aletta Edouard M.D.   On: 04/23/2020 09:44   MR ORBITS W WO CONTRAST  Result Date: 04/14/2020 CLINICAL DATA:  Blurred vision on the left. History of breast cancer. EXAM: MRI OF THE ORBITS WITHOUT AND WITH CONTRAST TECHNIQUE: Multiplanar, multisequence MR imaging of the orbits was performed both before and after the administration of intravenous contrast. CONTRAST:  17m MULTIHANCE GADOBENATE DIMEGLUMINE 529 MG/ML IV SOLN COMPARISON:  Cervical MRI 04/07/2020 FINDINGS: As seen previously, there is diffusely abnormal marrow throughout the calvarium, skull base and upper cervical spine, worrisome for widespread osseous metastatic disease. I do not see gross compromise at either orbital apex. The right globe is normal. Right optic nerve is normal. Extraocular muscles on the right are normal. Right lacrimal gland is normal. On the left, the globe itself appears intact. However, there is a large amount of edematous and enhancing tissue surrounding the globe, particularly superior within the orbit. Lacrimal gland cannot be specifically separated. Abnormal edema and enhancement extends back towards the orbital apex. 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 Absent metastatic disease, the appearance could be due to orbital pseudotumor/idiopathic orbital inflammation. I do not see any tumor  within either cavernous sinus region. Visualized brain appears normal, though not completely evaluated. IMPRESSION: 1. Large amount of edematous and enhancing tissue surrounding the left globe, particularly superior within the orbit. Lacrimal gland cannot be specifically separated. Abnormal edema and enhancement extends back towards the orbital apex. 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 Absent metastatic disease, the appearance could be due to orbital pseudotumor/idiopathic orbital inflammation. 2. Diffusely abnormal marrow signal, worrisome for widespread osseous metastatic disease. Electronically Signed   By: MNelson ChimesM.D.   On: 04/14/2020 19:09     ASSESSMENT: 45y.o. BRCA negative [place] woman  (1) status post bilateral mastectomies with left axillary lymph node sampling [6 nodes removed] 01/02/2010 for a pT2 pN0(i+), stage IIA invasive lobular breast cancer, grade 3, estrogen and progesterone receptor positive, HER-2 not amplified  (a) right breast was benign  (2) Oncotype DX score of 22 predicted a 14% risk of distant recurrence within 10 years if the patient's only systemic treatment is tamoxifen for 5 years  (3) status post CMF(cyclophosphamide, fluorouracil, methotrexate) x7 given between October of 2011 and March of 2012 (7 of 8 planned treatments completed)  (4) tamoxifen started April 2012, discontinued approximately July of 2013 (about 15 months) because of problems with hot flashes and insomnia  (5) pathologically documented left axillary recurrence 05/05/2013, the tumor being again estrogen and progesterone receptor positive, with HER-2 not amplified,  and MIB-1 of 15%. Staging CT/PET 05/27/2013 show left axillary and supraclavicular recurrence, no distant disease  (6) status post completion left axillary lymph node dissection 06/16/2013 showing a  total of 10/15 lymph nodes involved by tumor, with evidence of extracapsular extension (TX N3 = stage IIIC)  (7) treated with carboplatin/ docetaxel, first dose on 07/11/2013, repeated every 21 days x 4 with Neulasta support on day 2.  Final dose was given on 09/12/2013, with chemotherapy discontinued after 4 cycles due to continuing low counts.  (8) radiation completed 12/09/2013: Left chest wall / 50.4 Gray @ 1.8 Gray per fraction x 28 fractions Left Supraclavicular fossa and PAB/ 45 Gray @1 .8 Gray per fraction x 25 fractions Left scar / 10 Gray at [employer] per fraction x 5 fractions   (9) bilateral salpingo-oophorectomy on 01/25/14  (10)   lymphedema and limited range of motion in the left upper extremity secondary to left axillary lymph node dissection, being treated through the lymphedema clinic  - improved   (13) sequencing and deletion/duplication analysis of 17 genes performed September 2015 at [facility] showed no deleterious mutations in  ATM, BARD1, BRCA1, BRCA2, BRIP1, CDH1, CHEK2, MRE11A, MUTYH, NBN, NF1, PALB2, PTEN, RAD50, RAD51C, RAD51D, and TP53.  (14) tamoxifen re-started 02/27/2014, discontinued December 2021 with progression  (15) bilateral breast implant exchange with capsulotomies 03/18/2018 following left implant rupture and bilateral capsular contractures. Left - [employer] Smooth Round Ultra High Profile Gel 590cc. Ref #[id number].  Serial Number [id number] Right - [employer] Smooth Round Ultra High Profile Gel 590cc. Ref #[id number].  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, estrogen and progesterone receptor positive  (a) MRI of the cervical spine 04/07/2020 shows markedly abnormal marrow signal, prominent right axillary lymph nodes, degenerative disc disease  (b) MRI of the orbits 04/13/2020 suggests metastatic disease around the left orbit and diffusely abnormal bone marrow signal  (c) CT  scan of the chest, abdomen and pelvis 05/10/2020 shows extensive adenopathy (right axillary, prevascular, right hilar, celiac and portacaval axes), multiple liver masses, multiple sclerotic bone lesions  (d) bone scan 05/10/2020 shows uptake through the pelvis spine sternum and ribs and possibly the right mandible.  Consistent with widespread bony metastatic disease  (e) brain MRI 05/09/2020 shows no evidence of intracranial metastases, with left periorbital disease as previously documented  (17) radiation to the left periorbital region pending  (18) anastrozole started 05/01/2020  (a) palbociclib started 05/11/2020  (b) denosumab/ Xgeva started 05/11/2020   PLAN: I reviewed the results of the staging studies with [name].  She understands that she has breast cancer in the bone, breast cancer in the liver, and breast cancer in the lymph nodes--she does not have bone cancer or liver cancer or lymph node cancer.  She has one cancer in many places not several different cancers.  Accordingly she is being treated for breast cancer which is the only cancer that she has.  She received optimal chemotherapy in the past as detailed above.  This cancer was present at the time of those chemotherapy treatments and was able to survive them.  Accordingly I really do not think she would get much benefit from chemotherapy at this point.  Instead were going to go treatment on the antiestrogen side.  She started anastrozole 05/01/2020.  So far she is tolerating that well.  She tells me she will receive the palbociclib/Ibrance by mail today and if she does she can start those tablets  once a day.  We discussed the possible toxicities side effects and complications of that.  She is also receiving her first denosumab/Xgeva dose today.  I am concerned because her bone scan did show some right mandibular uptake.  She does not have a dentist at present.  I suggested she do establish herself with a dentist as soon as possible  to minimize the chance of osteonecrosis of the jaw developing  Her pain is not controlled.  We discussed this at length today.  She is going to start Aleve 220 mg together with Tylenol 500 mg orally three times a day.  If that does not work she will add tramadol.  She understands the tramadol will constipate her and for that she will take stool softener 200 mg twice daily and MiraLAX daily and use a laxative on the third day as needed.  All that information was given to her in writing and also I called in all the prescriptions including the over-the-counter drugs to her local pharmacy so she can pick them up on the way home.  We have asked radiation oncology if they can possibly move up her appointment.  They are trying to work her in today.  I am hopeful that within a few days of starting radiation treatments her vision will improve and her headache will improve or resolve  Tentatively I am setting her up for a repeat appointment with [place] in a week  Total encounter time 50 minutes.*   Emmarie Sannes, Virgie Dad, MD  05/11/20 9:59 AM Medical Oncology and Hematology [facility] [place], [place] [address] Tel. [phone]    Fax. [phone]   I, Wilburn Mylar, am acting as scribe for Dr. Virgie Dad. Nuri Larmer.  I, Lurline Del MD, have reviewed the above documentation for accuracy and completeness, and I agree with the above.   *Total Encounter Time as defined by the Centers for Medicare and Medicaid Services includes, in addition to the face-to-face time of a patient visit (documented in the note above) non-face-to-face time: obtaining and reviewing outside history, ordering and reviewing medications, tests or procedures, care coordination (communications with other health care professionals or caregivers) and documentation in the medical record.

## 2020-05-10 NOTE — Progress Notes (Incomplete)
Location/Histology of Brain Tumor:  Patient presented with symptoms of:   Past or anticipated interventions, if any, per neurosurgery:   Past or anticipated interventions, if any, per medical oncology:   Dose of Decadron, if applicable: 4 mg daily  Recent neurologic symptoms, if any:   Seizures:   Headaches:  Nausea:  Dizziness/ataxia:   Difficulty with hand coordination:  Focal numbness/weakness:  Visual deficits/changes:   Confusion/Memory deficits:   Painful bone metastases at present, if any  SAFETY ISSUES:  Prior radiation?   Pacemaker/ICD?  Possible current pregnancy?  Is the patient on methotrexate?   Additional Complaints / other details:

## 2020-05-11 ENCOUNTER — Inpatient Hospital Stay: Payer: BC Managed Care – PPO

## 2020-05-11 ENCOUNTER — Other Ambulatory Visit: Payer: Self-pay | Admitting: Oncology

## 2020-05-11 ENCOUNTER — Ambulatory Visit
Admission: RE | Admit: 2020-05-11 | Discharge: 2020-05-11 | Disposition: A | Discharge status: Home / Self Care | Payer: BC Managed Care – PPO | Source: Ambulatory Visit | Attending: Radiation Oncology | Admitting: Radiation Oncology

## 2020-05-11 ENCOUNTER — Other Ambulatory Visit: Payer: Self-pay

## 2020-05-11 ENCOUNTER — Inpatient Hospital Stay: Payer: BC Managed Care – PPO | Admitting: Oncology

## 2020-05-11 VITALS — BP 120/75 | HR 98 | Temp 98.1°F | Resp 18 | Ht 68.0 in | Wt 178.5 lb

## 2020-05-11 DIAGNOSIS — Z923 Personal history of irradiation: Secondary | ICD-10-CM | POA: Diagnosis not present

## 2020-05-11 DIAGNOSIS — Z9013 Acquired absence of bilateral breasts and nipples: Secondary | ICD-10-CM | POA: Diagnosis not present

## 2020-05-11 DIAGNOSIS — C787 Secondary malignant neoplasm of liver and intrahepatic bile duct: Secondary | ICD-10-CM | POA: Insufficient documentation

## 2020-05-11 DIAGNOSIS — C50912 Malignant neoplasm of unspecified site of left female breast: Secondary | ICD-10-CM

## 2020-05-11 DIAGNOSIS — Z510 Encounter for antineoplastic radiation therapy: Secondary | ICD-10-CM | POA: Diagnosis not present

## 2020-05-11 DIAGNOSIS — C7951 Secondary malignant neoplasm of bone: Secondary | ICD-10-CM

## 2020-05-11 DIAGNOSIS — R519 Headache, unspecified: Secondary | ICD-10-CM | POA: Diagnosis not present

## 2020-05-11 DIAGNOSIS — Z9221 Personal history of antineoplastic chemotherapy: Secondary | ICD-10-CM | POA: Diagnosis not present

## 2020-05-11 DIAGNOSIS — C773 Secondary and unspecified malignant neoplasm of axilla and upper limb lymph nodes: Secondary | ICD-10-CM

## 2020-05-11 DIAGNOSIS — G893 Neoplasm related pain (acute) (chronic): Secondary | ICD-10-CM

## 2020-05-11 DIAGNOSIS — C50812 Malignant neoplasm of overlapping sites of left female breast: Secondary | ICD-10-CM

## 2020-05-11 DIAGNOSIS — Z170 Estrogen receptor positive status [ER+]: Secondary | ICD-10-CM

## 2020-05-11 DIAGNOSIS — Z9079 Acquired absence of other genital organ(s): Secondary | ICD-10-CM | POA: Diagnosis not present

## 2020-05-11 DIAGNOSIS — C7952 Secondary malignant neoplasm of bone marrow: Secondary | ICD-10-CM | POA: Diagnosis not present

## 2020-05-11 DIAGNOSIS — H538 Other visual disturbances: Secondary | ICD-10-CM | POA: Diagnosis not present

## 2020-05-11 DIAGNOSIS — D649 Anemia, unspecified: Secondary | ICD-10-CM | POA: Diagnosis not present

## 2020-05-11 DIAGNOSIS — Z90722 Acquired absence of ovaries, bilateral: Secondary | ICD-10-CM | POA: Diagnosis not present

## 2020-05-11 DIAGNOSIS — M47892 Other spondylosis, cervical region: Secondary | ICD-10-CM | POA: Diagnosis not present

## 2020-05-11 DIAGNOSIS — C7949 Secondary malignant neoplasm of other parts of nervous system: Secondary | ICD-10-CM | POA: Diagnosis not present

## 2020-05-11 DIAGNOSIS — I890 Lymphedema, not elsewhere classified: Secondary | ICD-10-CM | POA: Diagnosis not present

## 2020-05-11 LAB — SAMPLE TO BLOOD BANK

## 2020-05-11 LAB — CMP (CANCER CENTER ONLY)
ALT: 10 U/L (ref 0–44)
AST: 19 U/L (ref 15–41)
Albumin: 3 g/dL — ABNORMAL LOW (ref 3.5–5.0)
Alkaline Phosphatase: 98 U/L (ref 38–126)
Anion gap: 9 (ref 5–15)
BUN: 13 mg/dL (ref 6–20)
CO2: 27 mmol/L (ref 22–32)
Calcium: 10.1 mg/dL (ref 8.9–10.3)
Chloride: 104 mmol/L (ref 98–111)
Creatinine: 0.78 mg/dL (ref 0.44–1.00)
GFR, Estimated: >60 mL/min (ref >60)
Glucose, Bld: 116 mg/dL — ABNORMAL HIGH (ref 70–99)
Potassium: 4 mmol/L (ref 3.5–5.1)
Sodium: 140 mmol/L (ref 135–145)
Total Bilirubin: 0.4 mg/dL (ref 0.3–1.2)
Total Protein: 7.7 g/dL (ref 6.5–8.1)

## 2020-05-11 LAB — CBC WITH DIFFERENTIAL (CANCER CENTER ONLY)
Abs Immature Granulocytes: 0.08 10*3/uL — ABNORMAL HIGH (ref 0.00–0.07)
Basophils Absolute: 0 10*3/uL (ref 0.0–0.1)
Basophils Relative: 0 %
Eosinophils Absolute: 0 10*3/uL (ref 0.0–0.5)
Eosinophils Relative: 0 %
HCT: 30.7 % — ABNORMAL LOW (ref 36.0–46.0)
Hemoglobin: 9.7 g/dL — ABNORMAL LOW (ref 12.0–15.0)
Immature Granulocytes: 1 %
Lymphocytes Relative: 20 %
Lymphs Abs: 2.4 10*3/uL (ref 0.7–4.0)
MCH: 25.6 pg — ABNORMAL LOW (ref 26.0–34.0)
MCHC: 31.6 g/dL (ref 30.0–36.0)
MCV: 81 fL (ref 80.0–100.0)
Monocytes Absolute: 1 10*3/uL (ref 0.1–1.0)
Monocytes Relative: 9 %
Neutro Abs: 8.4 10*3/uL — ABNORMAL HIGH (ref 1.7–7.7)
Neutrophils Relative %: 70 %
Platelet Count: 313 10*3/uL (ref 150–400)
RBC: 3.79 MIL/uL — ABNORMAL LOW (ref 3.87–5.11)
RDW: 19.7 % — ABNORMAL HIGH (ref 11.5–15.5)
WBC Count: 11.8 10*3/uL — ABNORMAL HIGH (ref 4.0–10.5)
nRBC: 0 % (ref 0.0–0.2)

## 2020-05-11 MED ORDER — TRAMADOL HCL 50 MG PO TABS: 50.0000 mg | ORAL_TABLET | Freq: Four times a day (QID) | ORAL | 0 refills | Status: DC | PRN

## 2020-05-11 MED ORDER — IBUPROFEN 200 MG PO TABS
ORAL_TABLET | ORAL | Status: AC
  Filled 2020-05-11: qty 2

## 2020-05-11 MED ORDER — NAPROXEN SODIUM 220 MG PO TABS: 220.0000 mg | ORAL_TABLET | Freq: Three times a day (TID) | ORAL | 3 refills | Status: DC | PRN

## 2020-05-11 MED ORDER — DOCUSATE SODIUM 100 MG PO CAPS: 200.0000 mg | ORAL_CAPSULE | Freq: Two times a day (BID) | ORAL | 0 refills | Status: DC | PRN

## 2020-05-11 MED ORDER — ACETAMINOPHEN 500 MG PO TABS
500.0000 mg | ORAL_TABLET | Freq: Once | ORAL | Status: AC
  Administered 2020-05-11: 500 mg via ORAL

## 2020-05-11 MED ORDER — ACETAMINOPHEN 500 MG PO TABS
ORAL_TABLET | ORAL | Status: AC
  Filled 2020-05-11: qty 2

## 2020-05-11 MED ORDER — IBUPROFEN 200 MG PO TABS
400.0000 mg | ORAL_TABLET | Freq: Once | ORAL | Status: AC
  Administered 2020-05-11: 400 mg via ORAL

## 2020-05-11 MED ORDER — POLYETHYLENE GLYCOL 3350 17 G PO PACK: 17.0000 g | PACK | Freq: Every day | ORAL | 0 refills | Status: DC

## 2020-05-11 MED ORDER — ACETAMINOPHEN 500 MG PO TABS: 500.0000 mg | ORAL_TABLET | Freq: Three times a day (TID) | ORAL | 0 refills | Status: DC | PRN

## 2020-05-12 ENCOUNTER — Ambulatory Visit
Admission: RE | Admit: 2020-05-12 | Discharge: 2020-05-12 | Disposition: A | Discharge status: Home / Self Care | Payer: BC Managed Care – PPO | Source: Ambulatory Visit | Attending: Radiation Oncology | Admitting: Radiation Oncology

## 2020-05-12 DIAGNOSIS — C7951 Secondary malignant neoplasm of bone: Secondary | ICD-10-CM | POA: Diagnosis not present

## 2020-05-12 DIAGNOSIS — C7949 Secondary malignant neoplasm of other parts of nervous system: Secondary | ICD-10-CM | POA: Diagnosis not present

## 2020-05-12 DIAGNOSIS — C50812 Malignant neoplasm of overlapping sites of left female breast: Secondary | ICD-10-CM | POA: Diagnosis not present

## 2020-05-12 DIAGNOSIS — C50912 Malignant neoplasm of unspecified site of left female breast: Secondary | ICD-10-CM | POA: Diagnosis not present

## 2020-05-12 DIAGNOSIS — Z510 Encounter for antineoplastic radiation therapy: Secondary | ICD-10-CM | POA: Diagnosis not present

## 2020-05-12 LAB — CANCER ANTIGEN 27.29: CA 27.29: 155.5 U/mL — ABNORMAL HIGH (ref 0.0–38.6)

## 2020-05-13 ENCOUNTER — Other Ambulatory Visit: Payer: BC Managed Care – PPO

## 2020-05-14 ENCOUNTER — Ambulatory Visit: Payer: BC Managed Care – PPO

## 2020-05-15 ENCOUNTER — Ambulatory Visit
Admission: RE | Admit: 2020-05-15 | Discharge: 2020-05-15 | Disposition: A | Discharge status: Home / Self Care | Payer: BC Managed Care – PPO | Source: Ambulatory Visit | Attending: Radiation Oncology | Admitting: Radiation Oncology

## 2020-05-15 DIAGNOSIS — C50812 Malignant neoplasm of overlapping sites of left female breast: Secondary | ICD-10-CM | POA: Diagnosis not present

## 2020-05-15 DIAGNOSIS — C7949 Secondary malignant neoplasm of other parts of nervous system: Secondary | ICD-10-CM | POA: Diagnosis not present

## 2020-05-15 DIAGNOSIS — Z510 Encounter for antineoplastic radiation therapy: Secondary | ICD-10-CM | POA: Diagnosis not present

## 2020-05-15 DIAGNOSIS — C7951 Secondary malignant neoplasm of bone: Secondary | ICD-10-CM | POA: Diagnosis not present

## 2020-05-15 DIAGNOSIS — C50912 Malignant neoplasm of unspecified site of left female breast: Secondary | ICD-10-CM | POA: Diagnosis not present

## 2020-05-15 NOTE — Telephone Encounter (Signed)
Oral Chemotherapy Pharmacist Encounter  I spoke with patient for overview of: Ibrance for the treatment of metastatic breast cancer, in combination with anastrozole, planned duration until disease progression or unacceptable toxicity.   Counseled patient on administration, dosing, side effects, monitoring, drug-food interactions, safe handling, storage, and disposal.  Patient will take Ibrance 125mg  tablets, 1 tablet by mouth once daily, with or without food, taken for 3 weeks on, 1 week off, and repeated.  Patient knows to avoid grapefruit and grapefruit juice while on treatment with Ibrance.  Patient is taking anastrozole 1 mg by mouth once daily.  Ibrance start date: 05/16/20 - Note Leslee Home was to be delivered by [facility] on 05/11/20, but due to inclement weather over the weekend medication delayed in being delivered. Estimated to arrive to patient on 05/15/20.  Adverse effects include but are not limited to: fatigue, hair loss, GI upset, nausea, decreased blood counts, and increased upper respiratory infections. Severe, life-threatening, and/or fatal interstitial lung disease (ILD) and/or pneumonitis may occur with CDK 4/6 inhibitors.  Patient will obtain anti diarrheal and alert the office of 4 or more loose stools above baseline.  Patient reminded of WBC check on Cycle 1 Day 14 for dose and ANC assessment.  Reviewed with patient importance of keeping a medication schedule and plan for any missed doses. No barriers to medication adherence identified.  Medication reconciliation performed and medication/allergy list updated.  Insurance authorization for Leslee Home has been obtained. Patient's insurance requires Ibrance be filled through [facility]. Copay card obtained for patient to make out of pocket cost $0 for 1st fill of Ibrance.   All questions answered.  Ms. Norgard voiced understanding and appreciation.   Medication education handout placed in mail for patient. Patient knows  to call the office with questions or concerns. Oral Chemotherapy Clinic phone number provided to patient.   Leron Croak, PharmD, BCPS Hematology/Oncology Clinical Pharmacist [facility] [phone] 05/15/2020 2:38 PM

## 2020-05-16 ENCOUNTER — Ambulatory Visit: Admission: RE | Admit: 2020-05-16 | Payer: BC Managed Care – PPO | Source: Ambulatory Visit

## 2020-05-16 ENCOUNTER — Other Ambulatory Visit: Payer: Self-pay

## 2020-05-16 ENCOUNTER — Encounter: Payer: Self-pay | Admitting: Oncology

## 2020-05-16 ENCOUNTER — Ambulatory Visit
Admission: RE | Admit: 2020-05-16 | Discharge: 2020-05-16 | Disposition: A | Discharge status: Home / Self Care | Payer: BC Managed Care – PPO | Source: Ambulatory Visit | Attending: Radiation Oncology | Admitting: Radiation Oncology

## 2020-05-16 DIAGNOSIS — C50812 Malignant neoplasm of overlapping sites of left female breast: Secondary | ICD-10-CM | POA: Diagnosis not present

## 2020-05-16 DIAGNOSIS — Z510 Encounter for antineoplastic radiation therapy: Secondary | ICD-10-CM | POA: Diagnosis not present

## 2020-05-16 DIAGNOSIS — C7949 Secondary malignant neoplasm of other parts of nervous system: Secondary | ICD-10-CM | POA: Diagnosis not present

## 2020-05-16 DIAGNOSIS — C7951 Secondary malignant neoplasm of bone: Secondary | ICD-10-CM

## 2020-05-16 DIAGNOSIS — C50912 Malignant neoplasm of unspecified site of left female breast: Secondary | ICD-10-CM | POA: Diagnosis not present

## 2020-05-16 NOTE — Progress Notes (Signed)
Ms Marcano states that she will not be coming to the appointment today at 100 pm with Dr. Earney Hamburg due to starting radiation with Dr. Lisbeth Renshaw

## 2020-05-16 NOTE — Progress Notes (Incomplete)
Radiation Oncology         [phone] ________________________________  Initial Outpatient Consultation  Name: [name] MRN: [id number]  Date: 05/16/2020  DOB: 06/21/1975  BT:DVVO, Arbie Cookey, MD  Magrinat, Virgie Dad, MD   REFERRING PHYSICIAN: Magrinat, Virgie Dad, MD  DIAGNOSIS: There were no encounter diagnoses.  Widespread metastatic breast cancer  HISTORY OF PRESENT ILLNESS::[name] is a 45 y.o. female who is seen as a courtesy of Dr. Jana Hakim for an opinion concerning radiation therapy as part of management for her widespread metastatic breast cancer. Today, she is accompanied by ***. The patient has a history of stage IIA invasive lobular carcinoma of the left breast dating back to 2011. She underwent bilateral mastectomies on 01/02/2010 and then underwent CMF x 7 between October of 2011 and March of 2012. She began Tamoxifen in April of 2012, which was discontinued in July of 2013 secondary to hot flashes and insomnia.   The patient had a pathologically documented left axillary recurrence January of 2015. Stage PET-CT scan on 05/27/2013 showed left axillary and supraclavicular recurrence without distant disease. She then underwent left axillary lymph node dissection on 06/16/2013 that showed involvement of 10/15 lymph nodes with evidence of extracapsular extension. She was treated with Carboplatin and Docetaxel x 4 from 07/11/2013 - 09/12/2013.  She underwent radiation therapy directed at the left chest wall (50.4 Gy at 1.8 Gy per fraction x 28 fractions), left supraclavicular fossa and PAB (45 Gray at 1.8 Gray per fraction x 25 fractions), and left scar (10 Gray at [employer] per fraction x 5 fractions) from 10/24/2013 to 12/09/2013 under the care of Dr. Pablo Ledger.  Tamoxifen was restarted on 02/27/2014 and then discontinued in December of 2021 secondary to disease progression.  Most recent significant imaging is as follows: 1. MRI of cervical spine on 04/07/2020 showed a  markedly abnormal and heterogenous appearance of the visualized bone marrow, new when compared to MRI from 2016. Osseous metastatic disease was of concern. T/here was no associated pathologic fracture or other extraosseous extension of tumor. However, there were mildly prominent right axillary lymph nodes that measured up to 1 cm in short axis with an additional mildly prominent 7 mm upper left mediastinal node. 2. MRI of orbits on 04/13/2020 showed a large amount of edematous and enhancing tissue surrounding the left globe, particularly superior within the orbit. The lacrimal gland could not be specifically separated. Additionally, there was abnormal edema and enhancement extending back towards the orbital apex, most worrisome for metastatic disease to the left orbit, potentially to the lacrimal gland or the superior orbital soft tissues. Finally, there was diffusely abnormal marrow signal that was worrisome for widespread osseous metastatic disease. 3. MRI of brain on 05/09/2020 showed no evidence of intracranial metastatic disease or acute intracranial abnormality. However, there was redemonstration of abnormal enhancing tissue surrounding the left globe and left lacrimal gland with extension posteriorly, concerning for metastatic disease given the clinical history. Finally, there was similar diffuse marked T1 hypointensity of the marrow that was non-specific but concerning for diffuse osseous metastatic disease. 4. CT scan of chest/abdomen/pelvis on 05/10/2020 showed numerous bulky right axillary and subpectoral lymph nodes in addition to numerous enlarged prevascular, pretracheal, subcarinal, and right hilar lymph nodes. Additionally, there were bulky celiac axis and portacaval lymph nodes with enlarged bilateral iliac lymph nodes and numerous additional prominent retroperitoneal lymph nodes. Finally, there were multiple hypodense liver masses and diffusely sclerotic osseous metastatic disease throughout the  axial skeleton and included proximal  femurs. 5. Bone scan on 05/10/2020 showed widespread osseous metastatic disease.  Bone marrow biopsy on 04/23/2020 showed variably cellular marrow with metastatic disease and normocytic-hypochromic anemia in the peripheral blood.  The patient was last seen by Dr. Jana Hakim on 05/11/2020, during which time Anastrozole, Palbociclib, Denosumab, and Xgeva were started.  It appears that the patient recently underwent palliative radiation treatment directed at the left eye (35 Gy delivered in 14 fractions of 2.5 Gy) under the care of Dr. Lisbeth Renshaw. Last treatment was on 05/12/2020.  PREVIOUS RADIATION THERAPY: Yes  Radiation therapy directed at the left chest wall (50.4 Gy at 1.8 Gy per fraction x 28 fractions), left supraclavicular fossa and PAB (45 Gray at 1.8 Gray per fraction x 25 fractions), and left scar (10 Gray at [employer] per fraction x 5 fractions) from 10/24/2013 to 12/09/2013 for left axillary recurrence under the care of Dr. Pablo Ledger.  Palliative radiation treatment directed at the left eye (35 Gy delivered in 14 fractions of 2.5 Gy) under the care of Dr. Lisbeth Renshaw. Last treatment was on 05/12/2020.  PAST MEDICAL HISTORY:  Past Medical History:  Diagnosis Date  . Back muscle spasm    occasional tx with skelaxin  . Cancer ([place])    breast ca  . GERD (gastroesophageal reflux disease)   . Malignant neoplasm of breast (female), unspecified site   . Radiation 10/24/13-12/09/13   Left chestwall/supraclav./scar    PAST SURGICAL HISTORY: Past Surgical History:  Procedure Laterality Date  . AXILLARY LYMPH NODE DISSECTION Left 06/16/2013   Procedure: LEFT AXILLARY NODE DISSECTION;  Surgeon: Shann Medal, MD;  Location: [facility];  Service: General;  Laterality: Left;  . BILATERAL TOTAL MASTECTOMY WITH AXILLARY LYMPH NODE DISSECTION    . BREAST IMPLANT REMOVAL Bilateral 03/18/2018   Procedure: REMOVAL BREAST IMPLANTS;  Surgeon: Wallace Going, DO;  Location:  [facility];  Service: Plastics;  Laterality: Bilateral;  . BREAST SURGERY    . CAPSULECTOMY Bilateral 03/18/2018   Procedure: CAPSULECTOMY;  Surgeon: Wallace Going, DO;  Location: [facility];  Service: Plastics;  Laterality: Bilateral;  . LAPAROSCOPIC BILATERAL SALPINGO OOPHERECTOMY Bilateral 01/25/2014   Procedure: LAPAROSCOPIC BILATERAL SALPINGO OOPHORECTOMY;  Surgeon: Eldred Manges, MD;  Location: [facility];  Service: Gynecology;  Laterality: Bilateral;  . LEEP    . PLACEMENT OF BREAST IMPLANTS Bilateral 03/18/2018   Procedure: PLACEMENT OF BREAST IMPLANTS;  Surgeon: Wallace Going, DO;  Location: [facility];  Service: Plastics;  Laterality: Bilateral;  . PORT-A-CATH REMOVAL N/A 01/25/2014   Procedure: REMOVAL PORT-A-CATH;  Surgeon: Alphonsa Overall, MD;  Location: [facility];  Service: General;  Laterality: N/A;  . PORTACATH PLACEMENT N/A 06/16/2013   Procedure: POWER PORT PLACEMENT;  Surgeon: Shann Medal, MD;  Location: [facility];  Service: General;  Laterality: N/A;  . right leg surgery     broken femur - has rod in leg  . TONSILLECTOMY    . WISDOM TOOTH EXTRACTION      FAMILY HISTORY:  Family History  Problem Relation Age of Onset  . Other Mother 48       glioblastoma; deceased 59  . Cancer Mother   . Other Father 63       glioblastoma; deceased 23  . Cancer Father   . Hypertension Other     SOCIAL HISTORY:  Social History   Tobacco Use  . Smoking status: Never Smoker  . Smokeless tobacco: Never Used  Substance Use Topics  . Alcohol  use: Yes    Comment: socially wine  . Drug use: No    ALLERGIES:  Allergies  Allergen Reactions  . Sulfa Antibiotics Hives and Itching  . Gadolinium Derivatives Hives    After gado injection, pt had a few small hives on left breast area. Treated with benadryl by dr Janeece Fitting   . Petrolatum Hives and Rash    MEDICATIONS:  Current Outpatient Medications  Medication Sig Dispense  Refill  . acetaminophen (TYLENOL) 500 MG tablet Take 1 tablet (500 mg total) by mouth 3 (three) times daily as needed for headache. Take with aleve 220 mg 30 tablet 0  . anastrozole (ARIMIDEX) 1 MG tablet Take 1 tablet (1 mg total) by mouth daily. 90 tablet 4  . dexamethasone (DECADRON) 4 MG tablet Take 1 tablet (4 mg total) by mouth daily. 60 tablet 1  . diazepam (VALIUM) 5 MG tablet Take one tablet by mouth just before radiology study; may repeat once. 10 tablet 0  . docusate sodium (COLACE) 100 MG capsule Take 2 capsules (200 mg total) by mouth 2 (two) times daily as needed for mild constipation. 120 capsule 0  . ibuprofen (ADVIL) 200 MG tablet Take 600 mg by mouth every 6 (six) hours as needed for headache or moderate pain. (Patient not taking: Reported on 04/23/2020)    . methylPREDNISolone (MEDROL DOSEPAK) 4 MG TBPK tablet Take as directed on package 21 tablet 0  . naproxen sodium (ALEVE) 220 MG tablet Take 1 tablet (220 mg total) by mouth 3 (three) times daily as needed. 90 tablet 3  . palbociclib (IBRANCE) 125 MG tablet Take 1 tablet (125 mg total) by mouth daily. Take for 21 days on, 7 days off, repeat every 28 days. 21 tablet 6  . polyethylene glycol (MIRALAX) 17 g packet Take 17 g by mouth daily. 14 each 0  . traMADol (ULTRAM) 50 MG tablet Take 1-2 tablets (50-100 mg total) by mouth every 6 (six) hours as needed. 60 tablet 0   No current facility-administered medications for this encounter.    REVIEW OF SYSTEMS:  A 10+ POINT REVIEW OF SYSTEMS WAS OBTAINED including neurology, dermatology, psychiatry, cardiac, respiratory, lymph, extremities, GI, GU, musculoskeletal, constitutional, reproductive, HEENT. ***   PHYSICAL EXAM:  vitals were not taken for this visit.   General: Alert and oriented, in no acute distress HEENT: Head is normocephalic. Extraocular movements are intact. Oropharynx is clear. Neck: Neck is supple, no palpable cervical or supraclavicular lymphadenopathy. Heart:  Regular in rate and rhythm with no murmurs, rubs, or gallops. Chest: Clear to auscultation bilaterally, with no rhonchi, wheezes, or rales. Abdomen: Soft, nontender, nondistended, with no rigidity or guarding. Extremities: No cyanosis or edema. Lymphatics: see Neck Exam Skin: No concerning lesions. Musculoskeletal: symmetric strength and muscle tone throughout. Neurologic: Cranial nerves II through XII are grossly intact. No obvious focalities. Speech is fluent. Coordination is intact. Psychiatric: Judgment and insight are intact. Affect is appropriate. ***  ECOG = ***  0 - Asymptomatic (Fully active, able to carry on all predisease activities without restriction)  1 - Symptomatic but completely ambulatory (Restricted in physically strenuous activity but ambulatory and able to carry out work of a light or sedentary nature. For example, light housework, office work)  2 - Symptomatic, <50% in bed during the day (Ambulatory and capable of all self care but unable to carry out any work activities. Up and about more than 50% of waking hours)  3 - Symptomatic, >50% in bed, but not bedbound (Capable  of only limited self-care, confined to bed or chair 50% or more of waking hours)  4 - Bedbound (Completely disabled. Cannot carry on any self-care. Totally confined to bed or chair)  5 - Death   Eustace Pen MM, Creech RH, Tormey DC, et al. [phone]). "Toxicity and response criteria of the [facility]". [place] Oncol. 5 (6): 649-55  LABORATORY DATA:  Lab Results  Component Value Date   WBC 11.8 (H) 05/11/2020   HGB 9.7 (L) 05/11/2020   HCT 30.7 (L) 05/11/2020   MCV 81.0 05/11/2020   PLT 313 05/11/2020   NEUTROABS 8.4 (H) 05/11/2020   Lab Results  Component Value Date   NA 140 05/11/2020   K 4.0 05/11/2020   CL 104 05/11/2020   CO2 27 05/11/2020   GLUCOSE 116 (H) 05/11/2020   CREATININE 0.78 05/11/2020   CALCIUM 10.1 05/11/2020      RADIOGRAPHY: CT Chest W  Contrast  Result Date: 05/10/2020 CLINICAL DATA:  Breast cancer restaging, recent bone marrow biopsy, left orbital mass EXAM: CT CHEST, ABDOMEN, AND PELVIS WITH CONTRAST TECHNIQUE: Multidetector CT imaging of the chest, abdomen and pelvis was performed following the standard protocol during bolus administration of intravenous contrast. CONTRAST:  142m OMNIPAQUE IOHEXOL 300 MG/ML SOLN, additional oral enteric contrast COMPARISON:  PET-CT, 04/12/2014 FINDINGS: CT CHEST FINDINGS Cardiovascular: No significant vascular findings. Normal heart size. No pericardial effusion. Mediastinum/Nodes: Numerous bulky right axillary and subpectoral lymph nodes, largest measuring 2.8 x 1.7 cm (series 2, image 20). Numerous enlarged prevascular, pretracheal, subcarinal, and right hilar lymph nodes, largest right hilar node measuring 1.7 x 1.2 cm (series 2, image 32). Thyroid gland, trachea, and esophagus demonstrate no significant findings. Lungs/Pleura: Minimal subpleural radiation fibrosis of the anterior right middle lobe (series 4, image 107) and left upper lobe (series 4, image 58). There is a new subpleural nodule of the azygoesophageal recess of the right lower lobe measuring 5 mm (series 4, image 92). No pleural effusion or pneumothorax. Musculoskeletal: No chest wall mass. Status post bilateral mastectomy with implant reconstruction. Diffuse sclerotic osseous metastatic disease throughout the axial skeleton. CT ABDOMEN PELVIS FINDINGS Hepatobiliary: Multiple hypodense liver masses, largest a bulky mass of the liver dome measuring 7.0 x 6.6 cm (series 2, image 51) no gallstones, gallbladder wall thickening, or biliary dilatation. Pancreas: Unremarkable. No pancreatic ductal dilatation or surrounding inflammatory changes. Spleen: Normal in size without significant abnormality. Adrenals/Urinary Tract: Adrenal glands are unremarkable. Kidneys are normal, without renal calculi, solid lesion, or hydronephrosis. Bladder is  unremarkable. Stomach/Bowel: Stomach is within normal limits. Appendix appears normal. No evidence of bowel wall thickening, distention, or inflammatory changes. Vascular/Lymphatic: No significant vascular findings are present. Bulky celiac axis and portacaval lymph nodes, difficult to distinguish from adjacent liver and pancreatic parenchyma, measuring up to 4.0 x 2.8 cm (series 2, image 62). Enlarged bilateral iliac lymph nodes, largest right external iliac node measuring 2.3 x 1.3 cm (series 2, image 103). Numerous additional prominent retroperitoneal lymph nodes. Reproductive: Uterine fibroids. Other: No abdominal wall hernia or abnormality. No abdominopelvic ascites. Musculoskeletal: Diffusely sclerotic osseous metastatic disease throughout the axial skeleton and included proximal femurs. Partially imaged intramedullary rod fixation of the right femoral diaphysis. IMPRESSION: 1. Numerous bulky right axillary and subpectoral lymph nodes in addition to numerous enlarged prevascular, pretracheal, subcarinal, and right hilar lymph nodes. 2. Bulky celiac axis and portacaval lymph nodes. Enlarged bilateral iliac lymph nodes and numerous additional prominent retroperitoneal lymph nodes. 3. Multiple hypodense liver masses. 4. Diffusely sclerotic  osseous metastatic disease throughout the axial skeleton and included proximal femurs. 5. Constellation of findings above is consistent with widespread nodal, hepatic, and osseous metastatic disease, which is new in comparison to most recent imaging of the body dated 04/12/2014. 6. Status post bilateral mastectomy and implant reconstruction Electronically Signed   By: Eddie Candle M.D.   On: 05/10/2020 09:43   MR Brain W Wo Contrast  Result Date: 05/09/2020 CLINICAL DATA:  Breast cancer, staging. EXAM: MRI HEAD WITHOUT AND WITH CONTRAST TECHNIQUE: Multiplanar, multiecho pulse sequences of the brain and surrounding structures were obtained without and with intravenous  contrast. CONTRAST:  66m MULTIHANCE GADOBENATE DIMEGLUMINE 529 MG/ML IV SOLN COMPARISON:  MRI of the orbits from December 17, 21. CT head from Sep 07, 2019. FINDINGS: Brain: No acute infarction, hemorrhage, hydrocephalus, extra-axial collection or mass lesion. No abnormal of enhancement. Mild T2/FLAIR hyperintensity within the white matter, within normal limits for age. Very mild diffuse dural prominence is favored within normal limits given the absence of focal nodularity, subjacent brain edema, or evidence of intracranial hypertension. Vascular: Major flow voids are maintained at the skull base. Skull and upper cervical spine: Similar diffuse marked T1 hypointensity of the marrow. Sinuses/Orbits: Redemonstrated abnormal enhancing tissue surrounding the left globe and left lacrimal gland with extension posteriorly, better characterized on recent MRI of the orbits from 04/13/2020. Other: No mastoid effusions. IMPRESSION: 1. No evidence of intracranial metastatic disease or acute intracranial abnormality. 2. Redemonstrated abnormal enhancing tissue surrounding the left globe and left lacrimal gland with extension posteriorly, concerning for metastatic disease given the clinical history and better characterized on recent dedicated MRI of the orbits. 3. Similar diffuse marked T1 hypointensity of the marrow, nonspecific but concerning for diffuse osseous metastatic disease. Electronically Signed   By: FMargaretha SheffieldMD   On: 05/09/2020 13:40   NM Bone Scan Whole Body  Result Date: 05/10/2020 CLINICAL DATA:  Metastatic breast cancer, restaging, LEFT orbital mass prior rodding of RIGHT leg EXAM: NUCLEAR MEDICINE WHOLE BODY BONE SCAN TECHNIQUE: Whole body anterior and posterior images were obtained approximately 3 hours after intravenous injection of radiopharmaceutical. RADIOPHARMACEUTICALS:  22 mCi Technetium-98mDP IV COMPARISON:  None Radiographic correlation: CT chest abdomen pelvis 05/10/2020 FINDINGS:  Uptake in mid and proximal RIGHT femur corresponding to IM nail on CT. Subtle focus of cortical uptake at distal RIGHT femur could potentially represent a distal locking screw but the distal aspect of the hardware is not imaged by CT to confirm hardware configuration. Mild uptake at shoulders, sternoclavicular joints, feet, typically degenerative. Mottled uptake throughout the pelvis, spine, sternum, and ribs, corresponding to sclerotic osseous metastases on CT. Focal increased uptake RIGHT mandible, most often related to dental disease but metastatic disease not excluded. Expected urinary tract and soft tissue distribution of tracer. IMPRESSION: Likely postsurgical changes of related to hardware in the RIGHT femur. Widespread osseous metastatic disease as above. Electronically Signed   By: MaLavonia Dana.D.   On: 05/10/2020 14:37   CT Abdomen Pelvis W Contrast  Result Date: 05/10/2020 CLINICAL DATA:  Breast cancer restaging, recent bone marrow biopsy, left orbital mass EXAM: CT CHEST, ABDOMEN, AND PELVIS WITH CONTRAST TECHNIQUE: Multidetector CT imaging of the chest, abdomen and pelvis was performed following the standard protocol during bolus administration of intravenous contrast. CONTRAST:  10054mMNIPAQUE IOHEXOL 300 MG/ML SOLN, additional oral enteric contrast COMPARISON:  PET-CT, 04/12/2014 FINDINGS: CT CHEST FINDINGS Cardiovascular: No significant vascular findings. Normal heart size. No pericardial effusion. Mediastinum/Nodes: Numerous bulky right axillary and  subpectoral lymph nodes, largest measuring 2.8 x 1.7 cm (series 2, image 20). Numerous enlarged prevascular, pretracheal, subcarinal, and right hilar lymph nodes, largest right hilar node measuring 1.7 x 1.2 cm (series 2, image 32). Thyroid gland, trachea, and esophagus demonstrate no significant findings. Lungs/Pleura: Minimal subpleural radiation fibrosis of the anterior right middle lobe (series 4, image 107) and left upper lobe (series 4, image  58). There is a new subpleural nodule of the azygoesophageal recess of the right lower lobe measuring 5 mm (series 4, image 92). No pleural effusion or pneumothorax. Musculoskeletal: No chest wall mass. Status post bilateral mastectomy with implant reconstruction. Diffuse sclerotic osseous metastatic disease throughout the axial skeleton. CT ABDOMEN PELVIS FINDINGS Hepatobiliary: Multiple hypodense liver masses, largest a bulky mass of the liver dome measuring 7.0 x 6.6 cm (series 2, image 51) no gallstones, gallbladder wall thickening, or biliary dilatation. Pancreas: Unremarkable. No pancreatic ductal dilatation or surrounding inflammatory changes. Spleen: Normal in size without significant abnormality. Adrenals/Urinary Tract: Adrenal glands are unremarkable. Kidneys are normal, without renal calculi, solid lesion, or hydronephrosis. Bladder is unremarkable. Stomach/Bowel: Stomach is within normal limits. Appendix appears normal. No evidence of bowel wall thickening, distention, or inflammatory changes. Vascular/Lymphatic: No significant vascular findings are present. Bulky celiac axis and portacaval lymph nodes, difficult to distinguish from adjacent liver and pancreatic parenchyma, measuring up to 4.0 x 2.8 cm (series 2, image 62). Enlarged bilateral iliac lymph nodes, largest right external iliac node measuring 2.3 x 1.3 cm (series 2, image 103). Numerous additional prominent retroperitoneal lymph nodes. Reproductive: Uterine fibroids. Other: No abdominal wall hernia or abnormality. No abdominopelvic ascites. Musculoskeletal: Diffusely sclerotic osseous metastatic disease throughout the axial skeleton and included proximal femurs. Partially imaged intramedullary rod fixation of the right femoral diaphysis. IMPRESSION: 1. Numerous bulky right axillary and subpectoral lymph nodes in addition to numerous enlarged prevascular, pretracheal, subcarinal, and right hilar lymph nodes. 2. Bulky celiac axis and portacaval  lymph nodes. Enlarged bilateral iliac lymph nodes and numerous additional prominent retroperitoneal lymph nodes. 3. Multiple hypodense liver masses. 4. Diffusely sclerotic osseous metastatic disease throughout the axial skeleton and included proximal femurs. 5. Constellation of findings above is consistent with widespread nodal, hepatic, and osseous metastatic disease, which is new in comparison to most recent imaging of the body dated 04/12/2014. 6. Status post bilateral mastectomy and implant reconstruction Electronically Signed   By: Eddie Candle M.D.   On: 05/10/2020 09:43   CT BONE MARROW BIOPSY & ASPIRATION  Result Date: 04/23/2020 CLINICAL DATA:  History of breast carcinoma with clinical suspicion bone/bone marrow involvement. EXAM: CT GUIDED BONE MARROW ASPIRATION AND BIOPSY ANESTHESIA/SEDATION: Versed 3.0 mg IV, Fentanyl 100 mcg IV Total Moderate Sedation Time:   15 minutes. The patient's level of consciousness and physiologic status were continuously monitored during the procedure by Radiology nursing. PROCEDURE: The procedure risks, benefits, and alternatives were explained to the patient. Questions regarding the procedure were encouraged and answered. The patient understands and consents to the procedure. A time out was performed prior to initiating the procedure. CT was performed of the lower lumbar spine and sacrum and bony pelvis in a prone position. The right gluteal region was prepped with chlorhexidine. Sterile gown and sterile gloves were used for the procedure. Local anesthesia was provided with 1% Lidocaine. Under CT guidance, an 11 gauge On Control bone cutting needle was advanced from a posterior approach into the right iliac bone. Needle positioning was confirmed with CT. Initial non heparinized and heparinized aspirate samples were obtained  of bone marrow. Core biopsy was performed via the On Control drill needle. COMPLICATIONS: None FINDINGS: Initial imaging demonstrates diffuse  abnormal sclerosis involving lower lumbar vertebral bodies, the sacrum and bilateral bony pelvis consistent with metastatic disease. No pathologic fractures are identified. Inspection of initial aspirate did reveal sparse particles. Intact core biopsy sample was obtained. IMPRESSION: CT guided bone marrow biopsy of right posterior iliac bone with both aspirate and core samples obtained. Imaging demonstrates abnormal sclerosis throughout the visualized lower lumbosacral spine and bony pelvis consistent with involvement by metastatic disease. Electronically Signed   By: Aletta Edouard M.D.   On: 04/23/2020 09:44      IMPRESSION: Widespread metastatic breast cancer  ***  Today, I talked to the patient and family about the findings and work-up thus far.  We discussed the natural history of metastatic breat cancer and general treatment, highlighting the role of radiotherapy in the management.  We discussed the available radiation techniques, and focused on the details of logistics and delivery.  We reviewed the anticipated acute and late sequelae associated with radiation in this setting.  The patient was encouraged to ask questions that I answered to the best of my ability. *** A patient consent form was discussed and signed.  We retained a copy for our records.  The patient would like to proceed with radiation and will be scheduled for CT simulation.  PLAN: ***   Total time spent in this encounter was *** minutes which included reviewing the patient's breast cancer history, treatment history, recent CT scans, MRIs, PET scan, bone scan, consultations, follow-ups, biopsy, pathology report, physical examination, and documentation.  ------------------------------------------------  Blair Promise, PhD, MD  This document serves as a record of services personally performed by Gery Pray, MD. It was created on his behalf by Clerance Lav, a trained medical scribe. The creation of this record is based on  the scribe's personal observations and the provider's statements to them. This document has been checked and approved by the attending provider.

## 2020-05-17 ENCOUNTER — Ambulatory Visit
Admission: RE | Admit: 2020-05-17 | Discharge: 2020-05-17 | Disposition: A | Discharge status: Home / Self Care | Payer: BC Managed Care – PPO | Source: Ambulatory Visit | Attending: Radiation Oncology | Admitting: Radiation Oncology

## 2020-05-17 DIAGNOSIS — C7949 Secondary malignant neoplasm of other parts of nervous system: Secondary | ICD-10-CM | POA: Diagnosis not present

## 2020-05-17 DIAGNOSIS — C7951 Secondary malignant neoplasm of bone: Secondary | ICD-10-CM | POA: Diagnosis not present

## 2020-05-17 DIAGNOSIS — C50912 Malignant neoplasm of unspecified site of left female breast: Secondary | ICD-10-CM | POA: Diagnosis not present

## 2020-05-17 DIAGNOSIS — Z510 Encounter for antineoplastic radiation therapy: Secondary | ICD-10-CM | POA: Diagnosis not present

## 2020-05-17 DIAGNOSIS — C50812 Malignant neoplasm of overlapping sites of left female breast: Secondary | ICD-10-CM | POA: Diagnosis not present

## 2020-05-17 NOTE — Progress Notes (Signed)
Radiation Oncology         [phone] ________________________________  Name: [name] MRN: [id number]  Date: 05/11/2020  DOB: 03-06-1976  OE:UMPN, Arbie Cookey, MD  Maurice Small, MD     REFERRING PHYSICIAN: Maurice Small, MD   DIAGNOSIS: The encounter diagnosis was Bone metastases [facility]).   HISTORY OF PRESENT ILLNESS::[name] is a 45 y.o. female who is seen for an initial consultation visit regarding the patient's diagnosis of metastatic breast cancer.  The patient has been found to have involvement of the left orbit and I been asked to see the patient regarding this.  MRI scan of the orbits on 04/13/2020 was performed for left-sided blurry vision and showed edematous changes with enhancing tissue surrounding the left globe.  Abnormal edema and enhancement extended back towards the orbital apex.  Bone marrow biopsy was completed subsequently and this returned positive for metastatic carcinoma.  The patient states today that she has continued blurred vision with diplopia and also pain associated with this region.  The patient has had radiation treatment before and is eager to proceed with palliative radiation as soon as possible.    PREVIOUS RADIATION THERAPY: Yes the patient received 60.4 Gray to the left breast/reconstructed breast in 2015   PAST MEDICAL HISTORY:  has a past medical history of Back muscle spasm, Cancer ([place]), GERD (gastroesophageal reflux disease), Malignant neoplasm of breast (female), unspecified site, and Radiation (10/24/13-12/09/13).     PAST SURGICAL HISTORY: Past Surgical History:  Procedure Laterality Date  . AXILLARY LYMPH NODE DISSECTION Left 06/16/2013   Procedure: LEFT AXILLARY NODE DISSECTION;  Surgeon: Shann Medal, MD;  Location: [facility];  Service: General;  Laterality: Left;  . BILATERAL TOTAL MASTECTOMY WITH AXILLARY LYMPH NODE DISSECTION    . BREAST IMPLANT REMOVAL Bilateral 03/18/2018   Procedure: REMOVAL BREAST IMPLANTS;  Surgeon:  Wallace Going, DO;  Location: [place];  Service: Plastics;  Laterality: Bilateral;  . BREAST SURGERY    . CAPSULECTOMY Bilateral 03/18/2018   Procedure: CAPSULECTOMY;  Surgeon: Wallace Going, DO;  Location: [place];  Service: Plastics;  Laterality: Bilateral;  . LAPAROSCOPIC BILATERAL SALPINGO OOPHERECTOMY Bilateral 01/25/2014   Procedure: LAPAROSCOPIC BILATERAL SALPINGO OOPHORECTOMY;  Surgeon: Eldred Manges, MD;  Location: [facility];  Service: Gynecology;  Laterality: Bilateral;  . LEEP    . PLACEMENT OF BREAST IMPLANTS Bilateral 03/18/2018   Procedure: PLACEMENT OF BREAST IMPLANTS;  Surgeon: Wallace Going, DO;  Location: [place];  Service: Plastics;  Laterality: Bilateral;  . PORT-A-CATH REMOVAL N/A 01/25/2014   Procedure: REMOVAL PORT-A-CATH;  Surgeon: Alphonsa Overall, MD;  Location: [facility];  Service: General;  Laterality: N/A;  . PORTACATH PLACEMENT N/A 06/16/2013   Procedure: POWER PORT PLACEMENT;  Surgeon: Shann Medal, MD;  Location: [facility];  Service: General;  Laterality: N/A;  . right leg surgery     broken femur - has rod in leg  . TONSILLECTOMY    . WISDOM TOOTH EXTRACTION       FAMILY HISTORY: family history includes Cancer in her father and mother; Hypertension in an other family member; Other (age of onset: 50) in her father; Other (age of onset: 54) in her mother.   SOCIAL HISTORY:  reports that she has never smoked. She has never used smokeless tobacco. She reports current alcohol use. She reports that she does not use drugs.   ALLERGIES: Sulfa antibiotics, Gadolinium derivatives, and Petrolatum   MEDICATIONS:  Current Outpatient  Medications  Medication Sig Dispense Refill  . acetaminophen (TYLENOL) 500 MG tablet Take 1 tablet (500 mg total) by mouth 3 (three) times daily as needed for headache. Take with aleve 220 mg 30 tablet 0  . anastrozole (ARIMIDEX) 1 MG tablet Take 1 tablet (1 mg total) by  mouth daily. 90 tablet 4  . dexamethasone (DECADRON) 4 MG tablet Take 1 tablet (4 mg total) by mouth daily. 60 tablet 1  . diazepam (VALIUM) 5 MG tablet Take one tablet by mouth just before radiology study; may repeat once. 10 tablet 0  . docusate sodium (COLACE) 100 MG capsule Take 2 capsules (200 mg total) by mouth 2 (two) times daily as needed for mild constipation. 120 capsule 0  . ibuprofen (ADVIL) 200 MG tablet Take 600 mg by mouth every 6 (six) hours as needed for headache or moderate pain. (Patient not taking: Reported on 04/23/2020)    . methylPREDNISolone (MEDROL DOSEPAK) 4 MG TBPK tablet Take as directed on package 21 tablet 0  . naproxen sodium (ALEVE) 220 MG tablet Take 1 tablet (220 mg total) by mouth 3 (three) times daily as needed. 90 tablet 3  . palbociclib (IBRANCE) 125 MG tablet Take 1 tablet (125 mg total) by mouth daily. Take for 21 days on, 7 days off, repeat every 28 days. 21 tablet 6  . polyethylene glycol (MIRALAX) 17 g packet Take 17 g by mouth daily. 14 each 0  . traMADol (ULTRAM) 50 MG tablet Take 1-2 tablets (50-100 mg total) by mouth every 6 (six) hours as needed. 60 tablet 0   No current facility-administered medications for this encounter.     REVIEW OF SYSTEMS:  A 15 point review of systems is documented in the electronic medical record. This was obtained by the nursing staff. However, I reviewed this with the patient to discuss relevant findings and make appropriate changes.  Pertinent items are noted in HPI.    PHYSICAL EXAM:  vitals were not taken for this visit.   ECOG = 1  0 - Asymptomatic (Fully active, able to carry on all predisease activities without restriction)  1 - Symptomatic but completely ambulatory (Restricted in physically strenuous activity but ambulatory and able to carry out work of a light or sedentary nature. For example, light housework, office work)  2 - Symptomatic, <50% in bed during the day (Ambulatory and capable of all self care  but unable to carry out any work activities. Up and about more than 50% of waking hours)  3 - Symptomatic, >50% in bed, but not bedbound (Capable of only limited self-care, confined to bed or chair 50% or more of waking hours)  4 - Bedbound (Completely disabled. Cannot carry on any self-care. Totally confined to bed or chair)  5 - Death   Eustace Pen MM, Creech RH, Tormey DC, et al. [phone]). "Toxicity and response criteria of the [facility]". [place] Oncol. 5 (6): 649-55  Alert, no distress Mild fullness present in the left orbit without any erythema, sign of infection, sclera clear   LABORATORY DATA:  Lab Results  Component Value Date   WBC 11.8 (H) 05/11/2020   HGB 9.7 (L) 05/11/2020   HCT 30.7 (L) 05/11/2020   MCV 81.0 05/11/2020   PLT 313 05/11/2020   Lab Results  Component Value Date   NA 140 05/11/2020   K 4.0 05/11/2020   CL 104 05/11/2020   CO2 27 05/11/2020   Lab Results  Component Value Date  ALT 10 05/11/2020   AST 19 05/11/2020   ALKPHOS 98 05/11/2020   BILITOT 0.4 05/11/2020      RADIOGRAPHY: CT Chest W Contrast  Result Date: 05/10/2020 CLINICAL DATA:  Breast cancer restaging, recent bone marrow biopsy, left orbital mass EXAM: CT CHEST, ABDOMEN, AND PELVIS WITH CONTRAST TECHNIQUE: Multidetector CT imaging of the chest, abdomen and pelvis was performed following the standard protocol during bolus administration of intravenous contrast. CONTRAST:  124m OMNIPAQUE IOHEXOL 300 MG/ML SOLN, additional oral enteric contrast COMPARISON:  PET-CT, 04/12/2014 FINDINGS: CT CHEST FINDINGS Cardiovascular: No significant vascular findings. Normal heart size. No pericardial effusion. Mediastinum/Nodes: Numerous bulky right axillary and subpectoral lymph nodes, largest measuring 2.8 x 1.7 cm (series 2, image 20). Numerous enlarged prevascular, pretracheal, subcarinal, and right hilar lymph nodes, largest right hilar node measuring 1.7 x 1.2 cm (series 2,  image 32). Thyroid gland, trachea, and esophagus demonstrate no significant findings. Lungs/Pleura: Minimal subpleural radiation fibrosis of the anterior right middle lobe (series 4, image 107) and left upper lobe (series 4, image 58). There is a new subpleural nodule of the azygoesophageal recess of the right lower lobe measuring 5 mm (series 4, image 92). No pleural effusion or pneumothorax. Musculoskeletal: No chest wall mass. Status post bilateral mastectomy with implant reconstruction. Diffuse sclerotic osseous metastatic disease throughout the axial skeleton. CT ABDOMEN PELVIS FINDINGS Hepatobiliary: Multiple hypodense liver masses, largest a bulky mass of the liver dome measuring 7.0 x 6.6 cm (series 2, image 51) no gallstones, gallbladder wall thickening, or biliary dilatation. Pancreas: Unremarkable. No pancreatic ductal dilatation or surrounding inflammatory changes. Spleen: Normal in size without significant abnormality. Adrenals/Urinary Tract: Adrenal glands are unremarkable. Kidneys are normal, without renal calculi, solid lesion, or hydronephrosis. Bladder is unremarkable. Stomach/Bowel: Stomach is within normal limits. Appendix appears normal. No evidence of bowel wall thickening, distention, or inflammatory changes. Vascular/Lymphatic: No significant vascular findings are present. Bulky celiac axis and portacaval lymph nodes, difficult to distinguish from adjacent liver and pancreatic parenchyma, measuring up to 4.0 x 2.8 cm (series 2, image 62). Enlarged bilateral iliac lymph nodes, largest right external iliac node measuring 2.3 x 1.3 cm (series 2, image 103). Numerous additional prominent retroperitoneal lymph nodes. Reproductive: Uterine fibroids. Other: No abdominal wall hernia or abnormality. No abdominopelvic ascites. Musculoskeletal: Diffusely sclerotic osseous metastatic disease throughout the axial skeleton and included proximal femurs. Partially imaged intramedullary rod fixation of the  right femoral diaphysis. IMPRESSION: 1. Numerous bulky right axillary and subpectoral lymph nodes in addition to numerous enlarged prevascular, pretracheal, subcarinal, and right hilar lymph nodes. 2. Bulky celiac axis and portacaval lymph nodes. Enlarged bilateral iliac lymph nodes and numerous additional prominent retroperitoneal lymph nodes. 3. Multiple hypodense liver masses. 4. Diffusely sclerotic osseous metastatic disease throughout the axial skeleton and included proximal femurs. 5. Constellation of findings above is consistent with widespread nodal, hepatic, and osseous metastatic disease, which is new in comparison to most recent imaging of the body dated 04/12/2014. 6. Status post bilateral mastectomy and implant reconstruction Electronically Signed   By: AEddie CandleM.D.   On: 05/10/2020 09:43   MR Brain W Wo Contrast  Result Date: 05/09/2020 CLINICAL DATA:  Breast cancer, staging. EXAM: MRI HEAD WITHOUT AND WITH CONTRAST TECHNIQUE: Multiplanar, multiecho pulse sequences of the brain and surrounding structures were obtained without and with intravenous contrast. CONTRAST:  151mMULTIHANCE GADOBENATE DIMEGLUMINE 529 MG/ML IV SOLN COMPARISON:  MRI of the orbits from December 17, 21. CT head from Sep 07, 2019. FINDINGS: Brain: No acute  infarction, hemorrhage, hydrocephalus, extra-axial collection or mass lesion. No abnormal of enhancement. Mild T2/FLAIR hyperintensity within the white matter, within normal limits for age. Very mild diffuse dural prominence is favored within normal limits given the absence of focal nodularity, subjacent brain edema, or evidence of intracranial hypertension. Vascular: Major flow voids are maintained at the skull base. Skull and upper cervical spine: Similar diffuse marked T1 hypointensity of the marrow. Sinuses/Orbits: Redemonstrated abnormal enhancing tissue surrounding the left globe and left lacrimal gland with extension posteriorly, better characterized on recent MRI  of the orbits from 04/13/2020. Other: No mastoid effusions. IMPRESSION: 1. No evidence of intracranial metastatic disease or acute intracranial abnormality. 2. Redemonstrated abnormal enhancing tissue surrounding the left globe and left lacrimal gland with extension posteriorly, concerning for metastatic disease given the clinical history and better characterized on recent dedicated MRI of the orbits. 3. Similar diffuse marked T1 hypointensity of the marrow, nonspecific but concerning for diffuse osseous metastatic disease. Electronically Signed   By: Margaretha Sheffield MD   On: 05/09/2020 13:40   NM Bone Scan Whole Body  Result Date: 05/10/2020 CLINICAL DATA:  Metastatic breast cancer, restaging, LEFT orbital mass prior rodding of RIGHT leg EXAM: NUCLEAR MEDICINE WHOLE BODY BONE SCAN TECHNIQUE: Whole body anterior and posterior images were obtained approximately 3 hours after intravenous injection of radiopharmaceutical. RADIOPHARMACEUTICALS:  22 mCi Technetium-69mMDP IV COMPARISON:  None Radiographic correlation: CT chest abdomen pelvis 05/10/2020 FINDINGS: Uptake in mid and proximal RIGHT femur corresponding to IM nail on CT. Subtle focus of cortical uptake at distal RIGHT femur could potentially represent a distal locking screw but the distal aspect of the hardware is not imaged by CT to confirm hardware configuration. Mild uptake at shoulders, sternoclavicular joints, feet, typically degenerative. Mottled uptake throughout the pelvis, spine, sternum, and ribs, corresponding to sclerotic osseous metastases on CT. Focal increased uptake RIGHT mandible, most often related to dental disease but metastatic disease not excluded. Expected urinary tract and soft tissue distribution of tracer. IMPRESSION: Likely postsurgical changes of related to hardware in the RIGHT femur. Widespread osseous metastatic disease as above. Electronically Signed   By: MLavonia DanaM.D.   On: 05/10/2020 14:37   CT Abdomen Pelvis W  Contrast  Result Date: 05/10/2020 CLINICAL DATA:  Breast cancer restaging, recent bone marrow biopsy, left orbital mass EXAM: CT CHEST, ABDOMEN, AND PELVIS WITH CONTRAST TECHNIQUE: Multidetector CT imaging of the chest, abdomen and pelvis was performed following the standard protocol during bolus administration of intravenous contrast. CONTRAST:  1029mOMNIPAQUE IOHEXOL 300 MG/ML SOLN, additional oral enteric contrast COMPARISON:  PET-CT, 04/12/2014 FINDINGS: CT CHEST FINDINGS Cardiovascular: No significant vascular findings. Normal heart size. No pericardial effusion. Mediastinum/Nodes: Numerous bulky right axillary and subpectoral lymph nodes, largest measuring 2.8 x 1.7 cm (series 2, image 20). Numerous enlarged prevascular, pretracheal, subcarinal, and right hilar lymph nodes, largest right hilar node measuring 1.7 x 1.2 cm (series 2, image 32). Thyroid gland, trachea, and esophagus demonstrate no significant findings. Lungs/Pleura: Minimal subpleural radiation fibrosis of the anterior right middle lobe (series 4, image 107) and left upper lobe (series 4, image 58). There is a new subpleural nodule of the azygoesophageal recess of the right lower lobe measuring 5 mm (series 4, image 92). No pleural effusion or pneumothorax. Musculoskeletal: No chest wall mass. Status post bilateral mastectomy with implant reconstruction. Diffuse sclerotic osseous metastatic disease throughout the axial skeleton. CT ABDOMEN PELVIS FINDINGS Hepatobiliary: Multiple hypodense liver masses, largest a bulky mass of the liver dome measuring  7.0 x 6.6 cm (series 2, image 51) no gallstones, gallbladder wall thickening, or biliary dilatation. Pancreas: Unremarkable. No pancreatic ductal dilatation or surrounding inflammatory changes. Spleen: Normal in size without significant abnormality. Adrenals/Urinary Tract: Adrenal glands are unremarkable. Kidneys are normal, without renal calculi, solid lesion, or hydronephrosis. Bladder is  unremarkable. Stomach/Bowel: Stomach is within normal limits. Appendix appears normal. No evidence of bowel wall thickening, distention, or inflammatory changes. Vascular/Lymphatic: No significant vascular findings are present. Bulky celiac axis and portacaval lymph nodes, difficult to distinguish from adjacent liver and pancreatic parenchyma, measuring up to 4.0 x 2.8 cm (series 2, image 62). Enlarged bilateral iliac lymph nodes, largest right external iliac node measuring 2.3 x 1.3 cm (series 2, image 103). Numerous additional prominent retroperitoneal lymph nodes. Reproductive: Uterine fibroids. Other: No abdominal wall hernia or abnormality. No abdominopelvic ascites. Musculoskeletal: Diffusely sclerotic osseous metastatic disease throughout the axial skeleton and included proximal femurs. Partially imaged intramedullary rod fixation of the right femoral diaphysis. IMPRESSION: 1. Numerous bulky right axillary and subpectoral lymph nodes in addition to numerous enlarged prevascular, pretracheal, subcarinal, and right hilar lymph nodes. 2. Bulky celiac axis and portacaval lymph nodes. Enlarged bilateral iliac lymph nodes and numerous additional prominent retroperitoneal lymph nodes. 3. Multiple hypodense liver masses. 4. Diffusely sclerotic osseous metastatic disease throughout the axial skeleton and included proximal femurs. 5. Constellation of findings above is consistent with widespread nodal, hepatic, and osseous metastatic disease, which is new in comparison to most recent imaging of the body dated 04/12/2014. 6. Status post bilateral mastectomy and implant reconstruction Electronically Signed   By: Eddie Candle M.D.   On: 05/10/2020 09:43   CT BONE MARROW BIOPSY & ASPIRATION  Result Date: 04/23/2020 CLINICAL DATA:  History of breast carcinoma with clinical suspicion bone/bone marrow involvement. EXAM: CT GUIDED BONE MARROW ASPIRATION AND BIOPSY ANESTHESIA/SEDATION: Versed 3.0 mg IV, Fentanyl 100 mcg IV  Total Moderate Sedation Time:   15 minutes. The patient's level of consciousness and physiologic status were continuously monitored during the procedure by Radiology nursing. PROCEDURE: The procedure risks, benefits, and alternatives were explained to the patient. Questions regarding the procedure were encouraged and answered. The patient understands and consents to the procedure. A time out was performed prior to initiating the procedure. CT was performed of the lower lumbar spine and sacrum and bony pelvis in a prone position. The right gluteal region was prepped with chlorhexidine. Sterile gown and sterile gloves were used for the procedure. Local anesthesia was provided with 1% Lidocaine. Under CT guidance, an 11 gauge On Control bone cutting needle was advanced from a posterior approach into the right iliac bone. Needle positioning was confirmed with CT. Initial non heparinized and heparinized aspirate samples were obtained of bone marrow. Core biopsy was performed via the On Control drill needle. COMPLICATIONS: None FINDINGS: Initial imaging demonstrates diffuse abnormal sclerosis involving lower lumbar vertebral bodies, the sacrum and bilateral bony pelvis consistent with metastatic disease. No pathologic fractures are identified. Inspection of initial aspirate did reveal sparse particles. Intact core biopsy sample was obtained. IMPRESSION: CT guided bone marrow biopsy of right posterior iliac bone with both aspirate and core samples obtained. Imaging demonstrates abnormal sclerosis throughout the visualized lower lumbosacral spine and bony pelvis consistent with involvement by metastatic disease. Electronically Signed   By: Aletta Edouard M.D.   On: 04/23/2020 09:44       IMPRESSION/ PLAN:  The patient has metastatic breast cancer with involvement of the left orbit.  She is an appropriate candidate  for palliative radiation treatment and given her symptoms and imaging findings, I would favor begin  treatment as soon as possible.  The patient is amenable to simulation today and treatment later today as well.  We will also continue treatment tomorrow on Saturday.  I discussed therefore a 3-week course of treatment to the left orbit.  We discussed the potential benefit of treatment to reduce mass-effect in this region.  Possible improvement in diplopia although we discussed that this is not a guarantee.  There is involvement extending around the lacrimal gland as well so we discussed dry eye as a likely symptom from this.  Also discussed the development of cataracts.  Given the pros and cons of treatment, the patient does wish to proceed with treatment and we will begin treatment later today urgently.  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  The total time spent on the patient's visit today was 30 min, including chart review, direct discussion/evaluation with the patient, and coordination of care.         ________________________________   Jodelle Gross, MD, PhD   **Disclaimer: This note was dictated with voice recognition software. Similar sounding words can inadvertently be transcribed and this note may contain transcription errors which may not have been corrected upon publication of note.**

## 2020-05-18 ENCOUNTER — Ambulatory Visit
Admission: RE | Admit: 2020-05-18 | Discharge: 2020-05-18 | Disposition: A | Discharge status: Home / Self Care | Payer: BC Managed Care – PPO | Source: Ambulatory Visit | Attending: Radiation Oncology | Admitting: Radiation Oncology

## 2020-05-18 DIAGNOSIS — C7951 Secondary malignant neoplasm of bone: Secondary | ICD-10-CM | POA: Diagnosis not present

## 2020-05-18 DIAGNOSIS — C50812 Malignant neoplasm of overlapping sites of left female breast: Secondary | ICD-10-CM | POA: Diagnosis not present

## 2020-05-18 DIAGNOSIS — C7949 Secondary malignant neoplasm of other parts of nervous system: Secondary | ICD-10-CM | POA: Diagnosis not present

## 2020-05-18 DIAGNOSIS — Z510 Encounter for antineoplastic radiation therapy: Secondary | ICD-10-CM | POA: Diagnosis not present

## 2020-05-18 DIAGNOSIS — C50912 Malignant neoplasm of unspecified site of left female breast: Secondary | ICD-10-CM | POA: Diagnosis not present

## 2020-05-21 ENCOUNTER — Ambulatory Visit
Admission: RE | Admit: 2020-05-21 | Discharge: 2020-05-21 | Disposition: A | Discharge status: Home / Self Care | Payer: BC Managed Care – PPO | Source: Ambulatory Visit | Attending: Radiation Oncology | Admitting: Radiation Oncology

## 2020-05-21 DIAGNOSIS — C50812 Malignant neoplasm of overlapping sites of left female breast: Secondary | ICD-10-CM | POA: Diagnosis not present

## 2020-05-21 DIAGNOSIS — C7949 Secondary malignant neoplasm of other parts of nervous system: Secondary | ICD-10-CM | POA: Diagnosis not present

## 2020-05-21 DIAGNOSIS — C7951 Secondary malignant neoplasm of bone: Secondary | ICD-10-CM | POA: Diagnosis not present

## 2020-05-21 DIAGNOSIS — Z510 Encounter for antineoplastic radiation therapy: Secondary | ICD-10-CM | POA: Diagnosis not present

## 2020-05-21 DIAGNOSIS — C50912 Malignant neoplasm of unspecified site of left female breast: Secondary | ICD-10-CM | POA: Diagnosis not present

## 2020-05-22 ENCOUNTER — Ambulatory Visit
Admission: RE | Admit: 2020-05-22 | Discharge: 2020-05-22 | Disposition: A | Discharge status: Home / Self Care | Payer: BC Managed Care – PPO | Source: Ambulatory Visit | Attending: Radiation Oncology | Admitting: Radiation Oncology

## 2020-05-22 DIAGNOSIS — Z510 Encounter for antineoplastic radiation therapy: Secondary | ICD-10-CM | POA: Diagnosis not present

## 2020-05-22 DIAGNOSIS — C7949 Secondary malignant neoplasm of other parts of nervous system: Secondary | ICD-10-CM | POA: Diagnosis not present

## 2020-05-22 DIAGNOSIS — C50812 Malignant neoplasm of overlapping sites of left female breast: Secondary | ICD-10-CM | POA: Diagnosis not present

## 2020-05-22 DIAGNOSIS — C7951 Secondary malignant neoplasm of bone: Secondary | ICD-10-CM | POA: Diagnosis not present

## 2020-05-22 DIAGNOSIS — C50912 Malignant neoplasm of unspecified site of left female breast: Secondary | ICD-10-CM | POA: Diagnosis not present

## 2020-05-23 ENCOUNTER — Telehealth: Payer: Self-pay

## 2020-05-23 ENCOUNTER — Other Ambulatory Visit: Payer: Self-pay

## 2020-05-23 ENCOUNTER — Ambulatory Visit
Admission: RE | Admit: 2020-05-23 | Discharge: 2020-05-23 | Disposition: A | Discharge status: Home / Self Care | Payer: BC Managed Care – PPO | Source: Ambulatory Visit | Attending: Radiation Oncology | Admitting: Radiation Oncology

## 2020-05-23 ENCOUNTER — Encounter: Payer: Self-pay | Admitting: Oncology

## 2020-05-23 DIAGNOSIS — C50912 Malignant neoplasm of unspecified site of left female breast: Secondary | ICD-10-CM | POA: Diagnosis not present

## 2020-05-23 DIAGNOSIS — Z510 Encounter for antineoplastic radiation therapy: Secondary | ICD-10-CM | POA: Diagnosis not present

## 2020-05-23 DIAGNOSIS — C7949 Secondary malignant neoplasm of other parts of nervous system: Secondary | ICD-10-CM | POA: Diagnosis not present

## 2020-05-23 DIAGNOSIS — C7951 Secondary malignant neoplasm of bone: Secondary | ICD-10-CM | POA: Diagnosis not present

## 2020-05-23 DIAGNOSIS — C50812 Malignant neoplasm of overlapping sites of left female breast: Secondary | ICD-10-CM | POA: Diagnosis not present

## 2020-05-23 NOTE — Telephone Encounter (Signed)
RN reviewed mychart message with MD regarding nose bleeds and sharp pain in upper right quadrant.   MD recommendations for use of normal saline nose spray, and MD will see patient tomorrow (1/27) after she finishes radiation.    Pt aware, verbalized understanding and agreement.

## 2020-05-24 ENCOUNTER — Other Ambulatory Visit: Payer: Self-pay

## 2020-05-24 ENCOUNTER — Ambulatory Visit
Admission: RE | Admit: 2020-05-24 | Discharge: 2020-05-24 | Disposition: A | Discharge status: Home / Self Care | Payer: BC Managed Care – PPO | Source: Ambulatory Visit | Attending: Radiation Oncology | Admitting: Radiation Oncology

## 2020-05-24 ENCOUNTER — Other Ambulatory Visit (HOSPITAL_BASED_OUTPATIENT_CLINIC_OR_DEPARTMENT_OTHER): Payer: BC Managed Care – PPO | Admitting: Oncology

## 2020-05-24 DIAGNOSIS — C50812 Malignant neoplasm of overlapping sites of left female breast: Secondary | ICD-10-CM | POA: Diagnosis not present

## 2020-05-24 DIAGNOSIS — C7951 Secondary malignant neoplasm of bone: Secondary | ICD-10-CM

## 2020-05-24 DIAGNOSIS — Z510 Encounter for antineoplastic radiation therapy: Secondary | ICD-10-CM | POA: Diagnosis not present

## 2020-05-24 DIAGNOSIS — C771 Secondary and unspecified malignant neoplasm of intrathoracic lymph nodes: Secondary | ICD-10-CM

## 2020-05-24 DIAGNOSIS — Z7189 Other specified counseling: Secondary | ICD-10-CM

## 2020-05-24 DIAGNOSIS — C50919 Malignant neoplasm of unspecified site of unspecified female breast: Secondary | ICD-10-CM

## 2020-05-24 DIAGNOSIS — C787 Secondary malignant neoplasm of liver and intrahepatic bile duct: Secondary | ICD-10-CM

## 2020-05-24 DIAGNOSIS — Z170 Estrogen receptor positive status [ER+]: Secondary | ICD-10-CM

## 2020-05-24 DIAGNOSIS — C7949 Secondary malignant neoplasm of other parts of nervous system: Secondary | ICD-10-CM | POA: Diagnosis not present

## 2020-05-24 DIAGNOSIS — C50912 Malignant neoplasm of unspecified site of left female breast: Secondary | ICD-10-CM | POA: Diagnosis not present

## 2020-05-24 NOTE — Progress Notes (Signed)
[place]  [phone] [phone]      ID: [name] OB: 11-03-1975  MR#: [id number]  GLO#:[id number]  Patient Care Team: Maurice Small, MD as PCP - General (Family Medicine) Darryl Willner, Virgie Dad, MD as Consulting Physician (Oncology) Alphonsa Overall, MD as Consulting Physician (General Surgery) Gavin Pound, MD as Consulting Physician (Rheumatology) Katy Fitch, Darlina Guys, MD as Consulting Physician (Ophthalmology) Gavin Pound, MD as Consulting Physician (Rheumatology) OTHER MD: Annette Stable [phone])  CHIEF COMPLAINT:  Stage IV breast cancer, estrogen receptor positive (s/p bilateral mastectomies)  CURRENT TREATMENT: To start anastrozole, palbociclib, denosumab   INTERVAL HISTORY: Adalea dropped in today for an unscheduled visit.  She was having epistaxis and on this monitor to make sure she was okay.  The epistaxis started on January 23 and stopped on January 25.  She thinks she may have been blowing her nose a little bit to aggressively.  In addition she had been taking the Aleve and Tylenol for pain but since her headache is now gone she is not taking those and perhaps that also contributed.  In any case that problem has resolved.  Since her last visit, she underwent brain MRI on 05/09/2020 showing: no evidence of intracranial metastatic disease or acute intracranial abnormality; redemonstrated abnormal enhancing tissue surrounding left globe and left lacrimal gland with extension posteriorly; similar nonspecific diffuse marked T1 hypointensity of marrow.  She also underwent restaging CT and bone scan yesterday, 05/10/2020. CT chest/abdomen/pelvis showed: findings consistent with widespread nodal, hepatic, and osseous metastatic disease, which is new in comparison to most recent imaging of body in 03/2014-- numerous bulky right axillary, subpectoral, celiac axis, and portacaval lymph nodes; numerous enlarged prevascular, pretracheal,  subcarinal, right hilar, and bilateral iliac lymph nodes; numerous prominent retroperitoneal lymph nodes; multiple hypodense liver masses; diffusely sclerotic osseous metastatic disease throughout axial skeleton and included proximal femurs.  Bone scan showed: widespread osseous metastatic disease; likely postsurgical changes related to hardware in right femur.  We started her on anastrozole at her last visit on 05/01/2020.  She was also prescribed palbociclib at that visit. She was delayed in getting this medication due to cost, but it is scheduled to arrive today for her.   REVIEW OF SYSTEMS: She still has double vision and some blurred vision but the ptosis is almost completely resolved and she has no headache and generally feels very well.  She is tolerating the anastrozole with no side effects other than minimal hot flashes.  She has no side effects from the [place] which she obtains free of charge.  It is mailed to her home.   COVID 19 VACCINATION STATUS: Not vaccinated as of 05/24/2020   BREAST CANCER HISTORY:  From the original intake note:  [place] had bilateral diagnostic mammography [facility] in [place] 11/30/2009 showing a 1.5 cm mass at the 9:00 position of the left breast with some satellites. A biopsy of the mass in question [facility] showed ([id number]) and invasive lobular carcinoma, which was estrogen and progesterone receptor both 100% positive, both with strong staining intensity, but HER-2 negative at 1+. Bilateral breast MRI 12/13/2009 in [place] showed in the left breast an irregularly marginated mass measuring 3.2 cm. There were 4 or 5 separate satellite lesions laterally and superior to the primary.  Accordingly, after appropriate discussion, the patient underwent bilateral mastectomies with left sentinel lymph node sampling 01/02/2010. The right breast was benign. The left breast showed a 2.3 cm invasive lobular carcinoma, grade 3,  with a total of 6 sentinel lymph nodes removed, all negative, although 2 showed isolated tumor cells by immunostaining. Margins were negative.  An Oncotype BX was sent, with a recurrence score of 22, predicting a risk of distant recurrence within 10 years with 14% of the patients only systemic treatment was tamoxifen. The patient was treated with CMF chemotherapy between 02/22/2010 and 07/05/2010, receiving 7 cycles at which point the patient refused further chemotherapy though there have been no major toxicities at according to the oncology note from Dr. Brent General. The patient was then started on tamoxifen 08/09/2010. By her cancer took approximately 12-15 months then stopped because of hot flashes and insomnia problems.  More recently, the patient noted a change in her left axilla andunderwent bilateral breast MRI January to 2015 at [facility]. The patient has bilateral submuscular silicone implants in place. The breast were unremarkable, but there were several lymph nodes with cortical thickening in the left axilla including a dominant 1 measuring 1.2 cm in the short axis. Ultrasonography of this area identified the lymph node in question and the patient underwent biopsy of the left axillary lymph node This showed ([id number]) near-total replacement of the lymph node by metastatic carcinoma. This was 96% estrogen receptor positive, and 84% progesterone receptor positive, both with strong staining intensity. The MIB-1 was 20%. HER-2 determination is pending.  The patient's subsequent history is as detailed below   PAST MEDICAL HISTORY: Past Medical History:  Diagnosis Date  . Back muscle spasm    occasional tx with skelaxin  . Cancer ([place])    breast ca  . GERD (gastroesophageal reflux disease)   . Malignant neoplasm of breast (female), unspecified site   . Radiation 10/24/13-12/09/13   Left chestwall/supraclav./scar    PAST SURGICAL HISTORY: Past Surgical History:  Procedure Laterality  Date  . AXILLARY LYMPH NODE DISSECTION Left 06/16/2013   Procedure: LEFT AXILLARY NODE DISSECTION;  Surgeon: Shann Medal, MD;  Location: [facility];  Service: General;  Laterality: Left;  . BILATERAL TOTAL MASTECTOMY WITH AXILLARY LYMPH NODE DISSECTION    . BREAST IMPLANT REMOVAL Bilateral 03/18/2018   Procedure: REMOVAL BREAST IMPLANTS;  Surgeon: Wallace Going, DO;  Location: [facility];  Service: Plastics;  Laterality: Bilateral;  . BREAST SURGERY    . CAPSULECTOMY Bilateral 03/18/2018   Procedure: CAPSULECTOMY;  Surgeon: Wallace Going, DO;  Location: [facility];  Service: Plastics;  Laterality: Bilateral;  . LAPAROSCOPIC BILATERAL SALPINGO OOPHERECTOMY Bilateral 01/25/2014   Procedure: LAPAROSCOPIC BILATERAL SALPINGO OOPHORECTOMY;  Surgeon: Eldred Manges, MD;  Location: [facility];  Service: Gynecology;  Laterality: Bilateral;  . LEEP    . PLACEMENT OF BREAST IMPLANTS Bilateral 03/18/2018   Procedure: PLACEMENT OF BREAST IMPLANTS;  Surgeon: Wallace Going, DO;  Location: [facility];  Service: Plastics;  Laterality: Bilateral;  . PORT-A-CATH REMOVAL N/A 01/25/2014   Procedure: REMOVAL PORT-A-CATH;  Surgeon: Alphonsa Overall, MD;  Location: [facility];  Service: General;  Laterality: N/A;  . PORTACATH PLACEMENT N/A 06/16/2013   Procedure: POWER PORT PLACEMENT;  Surgeon: Shann Medal, MD;  Location: [facility];  Service: General;  Laterality: N/A;  . right leg surgery     broken femur - has rod in leg  . TONSILLECTOMY    . WISDOM TOOTH EXTRACTION      FAMILY HISTORY Family History  Problem Relation Age of Onset  . Other Mother 1       glioblastoma; deceased 48  . Cancer  Mother   . Other Father 65       glioblastoma; deceased 95  . Cancer Father   . Hypertension Other    according to the patient both her parents died from a primary brain cancers, namely we have the stomas, her father at 68, her mother at 43. The patient had one  brother and one sister. There is no history of breast or ovarian cancer in the family   GYNECOLOGIC HISTORY:   (Reviewed 10/10/2013) Menarche age 39. The patient has never carried a child to term. She was still having regular periods at the time of her recent diagnosis. LMP 07/18/2013 as of 09/08/2013.  The patient took oral contraceptives between the ages of 47 and 1 with no complications. She stopped having periods with her chemotherapy in 2015.    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 She currently works as a Research scientist (physical sciences). She shares a home with her significant other, Burnice Logan, who works at [employer] and also in [place]: Not in [place]:  Social History   Tobacco Use  . Smoking status: Never Smoker  . Smokeless tobacco: Never Used  Substance Use Topics  . Alcohol use: Yes    Comment: socially wine  . Drug use: No     Colonoscopy: Never  PAP: December 2014  Bone density: Never  Lipid panel: Not on file   Allergies  Allergen Reactions  . Sulfa Antibiotics Hives and Itching  . Gadolinium Derivatives Hives    After gado injection, pt had a few small hives on left breast area. Treated with benadryl by dr Janeece Fitting   . Petrolatum Hives and Rash    Current Outpatient Medications  Medication Sig Dispense Refill  . acetaminophen (TYLENOL) 500 MG tablet Take 1 tablet (500 mg total) by mouth 3 (three) times daily as needed for headache. Take with aleve 220 mg 30 tablet 0  . anastrozole (ARIMIDEX) 1 MG tablet Take 1 tablet (1 mg total) by mouth daily. 90 tablet 4  . dexamethasone (DECADRON) 4 MG tablet Take 1 tablet (4 mg total) by mouth daily. 60 tablet 1  . diazepam (VALIUM) 5 MG tablet Take one tablet by mouth just before radiology study; may repeat once. 10 tablet 0  . docusate sodium  (COLACE) 100 MG capsule Take 2 capsules (200 mg total) by mouth 2 (two) times daily as needed for mild constipation. 120 capsule 0  . ibuprofen (ADVIL) 200 MG tablet Take 600 mg by mouth every 6 (six) hours as needed for headache or moderate pain. (Patient not taking: Reported on 04/23/2020)    . methylPREDNISolone (MEDROL DOSEPAK) 4 MG TBPK tablet Take as directed on package 21 tablet 0  . naproxen sodium (ALEVE) 220 MG tablet Take 1 tablet (220 mg total) by mouth 3 (three) times daily as needed. 90 tablet 3  . palbociclib (IBRANCE) 125 MG tablet Take 1 tablet (125 mg total) by mouth daily. Take for 21 days on, 7 days off, repeat every 28 days. 21 tablet 6  . polyethylene glycol (MIRALAX) 17 g packet Take 17 g by mouth daily. 14 each 0  . traMADol (ULTRAM) 50 MG tablet Take 1-2 tablets (50-100 mg total) by mouth every 6 (six) hours as needed. 60 tablet 0   No current facility-administered medications for this visit.  OBJECTIVE: African-American woman in no acute distress  There were no vitals filed for this visit. Wt Readings from Last 3 Encounters:  05/11/20 178 lb 8 oz (81 kg)  05/01/20 180 lb (81.6 kg)  04/23/20 180 lb 12.4 oz (82 kg)   There is no height or weight on file to calculate BMI.    ECOG FS:1 - Symptomatic but completely ambulatory  Left ptosis almost completely resolved Lungs no rales or rhonchi Heart regular rate and rhythm Abd soft, minimally tender to deep palpation in the right upper quadrant MSK no focal spinal tenderness Neuro: nonfocal, well oriented, positive affect Breasts: Deferred   LAB RESULTS: Lab Results  Component Value Date   WBC 11.8 (H) 05/11/2020   NEUTROABS 8.4 (H) 05/11/2020   HGB 9.7 (L) 05/11/2020   HCT 30.7 (L) 05/11/2020   MCV 81.0 05/11/2020   PLT 313 05/11/2020      Chemistry      Component Value Date/Time   NA 140 05/11/2020 0936   NA 141 10/30/2016 0813   K 4.0 05/11/2020 0936   K 4.0 10/30/2016 0813   CL 104 05/11/2020  0936   CO2 27 05/11/2020 0936   CO2 26 10/30/2016 0813   BUN 13 05/11/2020 0936   BUN 15.2 10/30/2016 0813   CREATININE 0.78 05/11/2020 0936   CREATININE 0.8 10/30/2016 0813      Component Value Date/Time   CALCIUM 10.1 05/11/2020 0936   CALCIUM 10.0 10/30/2016 0813   ALKPHOS 98 05/11/2020 0936   ALKPHOS 59 10/30/2016 0813   AST 19 05/11/2020 0936   AST 17 10/30/2016 0813   ALT 10 05/11/2020 0936   ALT 11 10/30/2016 0813   BILITOT 0.4 05/11/2020 0936   BILITOT 0.26 10/30/2016 0813      STUDIES: CT Chest W Contrast  Result Date: 05/10/2020 CLINICAL DATA:  Breast cancer restaging, recent bone marrow biopsy, left orbital mass EXAM: CT CHEST, ABDOMEN, AND PELVIS WITH CONTRAST TECHNIQUE: Multidetector CT imaging of the chest, abdomen and pelvis was performed following the standard protocol during bolus administration of intravenous contrast. CONTRAST:  157m OMNIPAQUE IOHEXOL 300 MG/ML SOLN, additional oral enteric contrast COMPARISON:  PET-CT, 04/12/2014 FINDINGS: CT CHEST FINDINGS Cardiovascular: No significant vascular findings. Normal heart size. No pericardial effusion. Mediastinum/Nodes: Numerous bulky right axillary and subpectoral lymph nodes, largest measuring 2.8 x 1.7 cm (series 2, image 20). Numerous enlarged prevascular, pretracheal, subcarinal, and right hilar lymph nodes, largest right hilar node measuring 1.7 x 1.2 cm (series 2, image 32). Thyroid gland, trachea, and esophagus demonstrate no significant findings. Lungs/Pleura: Minimal subpleural radiation fibrosis of the anterior right middle lobe (series 4, image 107) and left upper lobe (series 4, image 58). There is a new subpleural nodule of the azygoesophageal recess of the right lower lobe measuring 5 mm (series 4, image 92). No pleural effusion or pneumothorax. Musculoskeletal: No chest wall mass. Status post bilateral mastectomy with implant reconstruction. Diffuse sclerotic osseous metastatic disease throughout the axial  skeleton. CT ABDOMEN PELVIS FINDINGS Hepatobiliary: Multiple hypodense liver masses, largest a bulky mass of the liver dome measuring 7.0 x 6.6 cm (series 2, image 51) no gallstones, gallbladder wall thickening, or biliary dilatation. Pancreas: Unremarkable. No pancreatic ductal dilatation or surrounding inflammatory changes. Spleen: Normal in size without significant abnormality. Adrenals/Urinary Tract: Adrenal glands are unremarkable. Kidneys are normal, without renal calculi, solid lesion, or hydronephrosis. Bladder is unremarkable. Stomach/Bowel: Stomach is within normal limits. Appendix appears normal. No evidence of bowel wall thickening, distention, or inflammatory  changes. Vascular/Lymphatic: No significant vascular findings are present. Bulky celiac axis and portacaval lymph nodes, difficult to distinguish from adjacent liver and pancreatic parenchyma, measuring up to 4.0 x 2.8 cm (series 2, image 62). Enlarged bilateral iliac lymph nodes, largest right external iliac node measuring 2.3 x 1.3 cm (series 2, image 103). Numerous additional prominent retroperitoneal lymph nodes. Reproductive: Uterine fibroids. Other: No abdominal wall hernia or abnormality. No abdominopelvic ascites. Musculoskeletal: Diffusely sclerotic osseous metastatic disease throughout the axial skeleton and included proximal femurs. Partially imaged intramedullary rod fixation of the right femoral diaphysis. IMPRESSION: 1. Numerous bulky right axillary and subpectoral lymph nodes in addition to numerous enlarged prevascular, pretracheal, subcarinal, and right hilar lymph nodes. 2. Bulky celiac axis and portacaval lymph nodes. Enlarged bilateral iliac lymph nodes and numerous additional prominent retroperitoneal lymph nodes. 3. Multiple hypodense liver masses. 4. Diffusely sclerotic osseous metastatic disease throughout the axial skeleton and included proximal femurs. 5. Constellation of findings above is consistent with widespread nodal,  hepatic, and osseous metastatic disease, which is new in comparison to most recent imaging of the body dated 04/12/2014. 6. Status post bilateral mastectomy and implant reconstruction Electronically Signed   By: Eddie Candle M.D.   On: 05/10/2020 09:43   MR Brain W Wo Contrast  Result Date: 05/09/2020 CLINICAL DATA:  Breast cancer, staging. EXAM: MRI HEAD WITHOUT AND WITH CONTRAST TECHNIQUE: Multiplanar, multiecho pulse sequences of the brain and surrounding structures were obtained without and with intravenous contrast. CONTRAST:  27m MULTIHANCE GADOBENATE DIMEGLUMINE 529 MG/ML IV SOLN COMPARISON:  MRI of the orbits from December 17, 21. CT head from Sep 07, 2019. FINDINGS: Brain: No acute infarction, hemorrhage, hydrocephalus, extra-axial collection or mass lesion. No abnormal of enhancement. Mild T2/FLAIR hyperintensity within the white matter, within normal limits for age. Very mild diffuse dural prominence is favored within normal limits given the absence of focal nodularity, subjacent brain edema, or evidence of intracranial hypertension. Vascular: Major flow voids are maintained at the skull base. Skull and upper cervical spine: Similar diffuse marked T1 hypointensity of the marrow. Sinuses/Orbits: Redemonstrated abnormal enhancing tissue surrounding the left globe and left lacrimal gland with extension posteriorly, better characterized on recent MRI of the orbits from 04/13/2020. Other: No mastoid effusions. IMPRESSION: 1. No evidence of intracranial metastatic disease or acute intracranial abnormality. 2. Redemonstrated abnormal enhancing tissue surrounding the left globe and left lacrimal gland with extension posteriorly, concerning for metastatic disease given the clinical history and better characterized on recent dedicated MRI of the orbits. 3. Similar diffuse marked T1 hypointensity of the marrow, nonspecific but concerning for diffuse osseous metastatic disease. Electronically Signed   By:  FMargaretha SheffieldMD   On: 05/09/2020 13:40   NM Bone Scan Whole Body  Result Date: 05/10/2020 CLINICAL DATA:  Metastatic breast cancer, restaging, LEFT orbital mass prior rodding of RIGHT leg EXAM: NUCLEAR MEDICINE WHOLE BODY BONE SCAN TECHNIQUE: Whole body anterior and posterior images were obtained approximately 3 hours after intravenous injection of radiopharmaceutical. RADIOPHARMACEUTICALS:  22 mCi Technetium-950mDP IV COMPARISON:  None Radiographic correlation: CT chest abdomen pelvis 05/10/2020 FINDINGS: Uptake in mid and proximal RIGHT femur corresponding to IM nail on CT. Subtle focus of cortical uptake at distal RIGHT femur could potentially represent a distal locking screw but the distal aspect of the hardware is not imaged by CT to confirm hardware configuration. Mild uptake at shoulders, sternoclavicular joints, feet, typically degenerative. Mottled uptake throughout the pelvis, spine, sternum, and ribs, corresponding to sclerotic osseous metastases on CT.  Focal increased uptake RIGHT mandible, most often related to dental disease but metastatic disease not excluded. Expected urinary tract and soft tissue distribution of tracer. IMPRESSION: Likely postsurgical changes of related to hardware in the RIGHT femur. Widespread osseous metastatic disease as above. Electronically Signed   By: Lavonia Dana M.D.   On: 05/10/2020 14:37   CT Abdomen Pelvis W Contrast  Result Date: 05/10/2020 CLINICAL DATA:  Breast cancer restaging, recent bone marrow biopsy, left orbital mass EXAM: CT CHEST, ABDOMEN, AND PELVIS WITH CONTRAST TECHNIQUE: Multidetector CT imaging of the chest, abdomen and pelvis was performed following the standard protocol during bolus administration of intravenous contrast. CONTRAST:  168m OMNIPAQUE IOHEXOL 300 MG/ML SOLN, additional oral enteric contrast COMPARISON:  PET-CT, 04/12/2014 FINDINGS: CT CHEST FINDINGS Cardiovascular: No significant vascular findings. Normal heart size. No  pericardial effusion. Mediastinum/Nodes: Numerous bulky right axillary and subpectoral lymph nodes, largest measuring 2.8 x 1.7 cm (series 2, image 20). Numerous enlarged prevascular, pretracheal, subcarinal, and right hilar lymph nodes, largest right hilar node measuring 1.7 x 1.2 cm (series 2, image 32). Thyroid gland, trachea, and esophagus demonstrate no significant findings. Lungs/Pleura: Minimal subpleural radiation fibrosis of the anterior right middle lobe (series 4, image 107) and left upper lobe (series 4, image 58). There is a new subpleural nodule of the azygoesophageal recess of the right lower lobe measuring 5 mm (series 4, image 92). No pleural effusion or pneumothorax. Musculoskeletal: No chest wall mass. Status post bilateral mastectomy with implant reconstruction. Diffuse sclerotic osseous metastatic disease throughout the axial skeleton. CT ABDOMEN PELVIS FINDINGS Hepatobiliary: Multiple hypodense liver masses, largest a bulky mass of the liver dome measuring 7.0 x 6.6 cm (series 2, image 51) no gallstones, gallbladder wall thickening, or biliary dilatation. Pancreas: Unremarkable. No pancreatic ductal dilatation or surrounding inflammatory changes. Spleen: Normal in size without significant abnormality. Adrenals/Urinary Tract: Adrenal glands are unremarkable. Kidneys are normal, without renal calculi, solid lesion, or hydronephrosis. Bladder is unremarkable. Stomach/Bowel: Stomach is within normal limits. Appendix appears normal. No evidence of bowel wall thickening, distention, or inflammatory changes. Vascular/Lymphatic: No significant vascular findings are present. Bulky celiac axis and portacaval lymph nodes, difficult to distinguish from adjacent liver and pancreatic parenchyma, measuring up to 4.0 x 2.8 cm (series 2, image 62). Enlarged bilateral iliac lymph nodes, largest right external iliac node measuring 2.3 x 1.3 cm (series 2, image 103). Numerous additional prominent retroperitoneal  lymph nodes. Reproductive: Uterine fibroids. Other: No abdominal wall hernia or abnormality. No abdominopelvic ascites. Musculoskeletal: Diffusely sclerotic osseous metastatic disease throughout the axial skeleton and included proximal femurs. Partially imaged intramedullary rod fixation of the right femoral diaphysis. IMPRESSION: 1. Numerous bulky right axillary and subpectoral lymph nodes in addition to numerous enlarged prevascular, pretracheal, subcarinal, and right hilar lymph nodes. 2. Bulky celiac axis and portacaval lymph nodes. Enlarged bilateral iliac lymph nodes and numerous additional prominent retroperitoneal lymph nodes. 3. Multiple hypodense liver masses. 4. Diffusely sclerotic osseous metastatic disease throughout the axial skeleton and included proximal femurs. 5. Constellation of findings above is consistent with widespread nodal, hepatic, and osseous metastatic disease, which is new in comparison to most recent imaging of the body dated 04/12/2014. 6. Status post bilateral mastectomy and implant reconstruction Electronically Signed   By: AEddie CandleM.D.   On: 05/10/2020 09:43     ASSESSMENT: 45y.o. BRCA negative [place] woman  (1) status post bilateral mastectomies with left axillary lymph node sampling [6 nodes removed] 01/02/2010 for a pT2 pN0(i+), stage IIA invasive lobular breast  cancer, grade 3, estrogen and progesterone receptor positive, HER-2 not amplified  (a) right breast was benign  (2) Oncotype DX score of 22 predicted a 14% risk of distant recurrence within 10 years if the patient's only systemic treatment is tamoxifen for 5 years  (3) status post CMF(cyclophosphamide, fluorouracil, methotrexate) x7 given between October of 2011 and March of 2012 (7 of 8 planned treatments completed)  (4) tamoxifen started April 2012, discontinued approximately July of 2013 (about 15 months) because of problems with hot flashes and insomnia  (5) pathologically documented left  axillary recurrence 05/05/2013, the tumor being again estrogen and progesterone receptor positive, with HER-2 not amplified, and MIB-1 of 15%. Staging CT/PET 05/27/2013 show left axillary and supraclavicular recurrence, no distant disease  (6) status post completion left axillary lymph node dissection 06/16/2013 showing a total of 10/15 lymph nodes involved by tumor, with evidence of extracapsular extension (TX N3 = stage IIIC)  (7) treated with carboplatin/ docetaxel, first dose on 07/11/2013, repeated every 21 days x 4 with Neulasta support on day 2.  Final dose was given on 09/12/2013, with chemotherapy discontinued after 4 cycles due to continuing low counts.  (8) radiation completed 12/09/2013: Left chest wall / 50.4 Gray @ 1.8 Gray per fraction x 28 fractions Left Supraclavicular fossa and PAB/ 15 Gray @1 .8 Gray per fraction x 25 fractions Left scar / 10 Gray at [employer] per fraction x 5 fractions   (9) bilateral salpingo-oophorectomy on 01/25/14  (10)   lymphedema and limited range of motion in the left upper extremity secondary to left axillary lymph node dissection, being treated through the lymphedema clinic  - improved   (13) sequencing and deletion/duplication analysis of 17 genes performed September 2015 at [facility] showed no deleterious mutations in  ATM, BARD1, BRCA1, BRCA2, BRIP1, CDH1, CHEK2, MRE11A, MUTYH, NBN, NF1, PALB2, PTEN, RAD50, RAD51C, RAD51D, and TP53.  (14) tamoxifen re-started 02/27/2014, discontinued December 2021 with progression  (15) bilateral breast implant exchange with capsulotomies 03/18/2018 following left implant rupture and bilateral capsular contractures. Left - [employer] Smooth Round Ultra High Profile Gel 590cc. Ref #[id number].  Serial Number [id number] Right - [employer] Smooth Round Ultra High Profile Gel 590cc. Ref #[id number].  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,  estrogen and progesterone receptor positive  (a) MRI of the cervical spine 04/07/2020 shows markedly abnormal marrow signal, prominent right axillary lymph nodes, degenerative disc disease  (b) MRI of the orbits 04/13/2020 suggests metastatic disease around the left orbit and diffusely abnormal bone marrow signal  (c) CT scan of the chest, abdomen and pelvis 05/10/2020 shows extensive adenopathy (right axillary, prevascular, right hilar, celiac and portacaval axes), multiple liver masses, multiple sclerotic bone lesions  (d) bone scan 05/10/2020 shows uptake through the pelvis spine sternum and ribs and possibly the right mandible.  Consistent with widespread bony metastatic disease  (e) brain MRI 05/09/2020 shows no evidence of intracranial metastases, with left periorbital disease as previously documented  (f) CA 27-29 is informative (was 155.5 05/11/2020)  (17) radiation to the left periorbital region to be completed 05/31/2020  (18) anastrozole started 05/01/2020  (a) palbociclib started 05/11/2020  (b) denosumab/ Xgeva to start 06/07/2020   PLAN: Yaiza is tolerating her radiation well.  She completes this next week.  Her left eye is considerably improved.  The ptosis is almost completely resolved.  She has no double vision when she looks straight ahead but she does have  double vision with any peripheral view.  She does not feel nauseated or have any balance problems related to this.  She also has a little bit of blurred vision but again this is stable.  She is having her first Xgeva dose on 06/07/2020.  We are continuing the anastrozole and palbociclib at current doses and following lab work monthly.  Note that her CA 27-29 is informative and we will be following that as well.  The pain she has in the right upper quadrant of the abdomen with certain positions is going to be secondary to be lesions in her liver.  There is really not much we can do about this right now but if the treatment  works hopefully those lesions will become smaller and the pain will go away  I will see her March 10 with her Xgeva dose that day.  She knows to call for any other issue that may develop before then.   Milon Dethloff, Virgie Dad, MD  05/24/20 1:07 PM Medical Oncology and Hematology [facility] [address], [place] [address] Tel. [phone]    Fax. [phone]   I, Wilburn Mylar, am acting as scribe for Dr. Virgie Dad. Cherolyn Behrle.  I, Lurline Del MD, have reviewed the above documentation for accuracy and completeness, and I agree with the above.   *Total Encounter Time as defined by the Centers for Medicare and Medicaid Services includes, in addition to the face-to-face time of a patient visit (documented in the note above) non-face-to-face time: obtaining and reviewing outside history, ordering and reviewing medications, tests or procedures, care coordination (communications with other health care professionals or caregivers) and documentation in the medical record.

## 2020-05-25 ENCOUNTER — Other Ambulatory Visit: Payer: Self-pay

## 2020-05-25 ENCOUNTER — Ambulatory Visit
Admission: RE | Admit: 2020-05-25 | Discharge: 2020-05-25 | Disposition: A | Discharge status: Home / Self Care | Payer: BC Managed Care – PPO | Source: Ambulatory Visit | Attending: Radiation Oncology | Admitting: Radiation Oncology

## 2020-05-25 DIAGNOSIS — Z510 Encounter for antineoplastic radiation therapy: Secondary | ICD-10-CM | POA: Diagnosis not present

## 2020-05-25 DIAGNOSIS — C50812 Malignant neoplasm of overlapping sites of left female breast: Secondary | ICD-10-CM | POA: Diagnosis not present

## 2020-05-25 DIAGNOSIS — C7951 Secondary malignant neoplasm of bone: Secondary | ICD-10-CM | POA: Diagnosis not present

## 2020-05-25 DIAGNOSIS — C7949 Secondary malignant neoplasm of other parts of nervous system: Secondary | ICD-10-CM | POA: Diagnosis not present

## 2020-05-25 DIAGNOSIS — C50912 Malignant neoplasm of unspecified site of left female breast: Secondary | ICD-10-CM | POA: Diagnosis not present

## 2020-05-28 ENCOUNTER — Other Ambulatory Visit: Payer: Self-pay

## 2020-05-28 ENCOUNTER — Ambulatory Visit
Admission: RE | Admit: 2020-05-28 | Discharge: 2020-05-28 | Disposition: A | Discharge status: Home / Self Care | Payer: BC Managed Care – PPO | Source: Ambulatory Visit | Attending: Radiation Oncology | Admitting: Radiation Oncology

## 2020-05-28 DIAGNOSIS — C50912 Malignant neoplasm of unspecified site of left female breast: Secondary | ICD-10-CM | POA: Diagnosis not present

## 2020-05-28 DIAGNOSIS — C50812 Malignant neoplasm of overlapping sites of left female breast: Secondary | ICD-10-CM | POA: Diagnosis not present

## 2020-05-28 DIAGNOSIS — C7949 Secondary malignant neoplasm of other parts of nervous system: Secondary | ICD-10-CM | POA: Diagnosis not present

## 2020-05-28 DIAGNOSIS — C7951 Secondary malignant neoplasm of bone: Secondary | ICD-10-CM | POA: Diagnosis not present

## 2020-05-28 DIAGNOSIS — Z510 Encounter for antineoplastic radiation therapy: Secondary | ICD-10-CM | POA: Diagnosis not present

## 2020-05-29 ENCOUNTER — Ambulatory Visit: Payer: BC Managed Care – PPO

## 2020-05-29 ENCOUNTER — Other Ambulatory Visit: Payer: Self-pay

## 2020-05-29 ENCOUNTER — Ambulatory Visit
Admission: RE | Admit: 2020-05-29 | Discharge: 2020-05-29 | Disposition: A | Discharge status: Home / Self Care | Payer: BC Managed Care – PPO | Source: Ambulatory Visit | Attending: Radiation Oncology | Admitting: Radiation Oncology

## 2020-05-29 DIAGNOSIS — C7951 Secondary malignant neoplasm of bone: Secondary | ICD-10-CM | POA: Diagnosis not present

## 2020-05-29 DIAGNOSIS — C7949 Secondary malignant neoplasm of other parts of nervous system: Secondary | ICD-10-CM | POA: Diagnosis not present

## 2020-05-29 DIAGNOSIS — Z510 Encounter for antineoplastic radiation therapy: Secondary | ICD-10-CM | POA: Diagnosis not present

## 2020-05-29 DIAGNOSIS — C50812 Malignant neoplasm of overlapping sites of left female breast: Secondary | ICD-10-CM | POA: Insufficient documentation

## 2020-05-29 DIAGNOSIS — C50912 Malignant neoplasm of unspecified site of left female breast: Secondary | ICD-10-CM | POA: Diagnosis not present

## 2020-05-30 ENCOUNTER — Ambulatory Visit: Payer: BC Managed Care – PPO

## 2020-05-30 ENCOUNTER — Ambulatory Visit
Admission: RE | Admit: 2020-05-30 | Discharge: 2020-05-30 | Disposition: A | Discharge status: Home / Self Care | Payer: BC Managed Care – PPO | Source: Ambulatory Visit | Attending: Radiation Oncology | Admitting: Radiation Oncology

## 2020-05-30 ENCOUNTER — Other Ambulatory Visit: Payer: Self-pay

## 2020-05-30 DIAGNOSIS — C50812 Malignant neoplasm of overlapping sites of left female breast: Secondary | ICD-10-CM | POA: Diagnosis not present

## 2020-05-30 DIAGNOSIS — Z510 Encounter for antineoplastic radiation therapy: Secondary | ICD-10-CM | POA: Diagnosis not present

## 2020-05-30 DIAGNOSIS — C7951 Secondary malignant neoplasm of bone: Secondary | ICD-10-CM | POA: Diagnosis not present

## 2020-05-30 DIAGNOSIS — C7949 Secondary malignant neoplasm of other parts of nervous system: Secondary | ICD-10-CM | POA: Diagnosis not present

## 2020-05-30 DIAGNOSIS — C50912 Malignant neoplasm of unspecified site of left female breast: Secondary | ICD-10-CM | POA: Diagnosis not present

## 2020-05-31 ENCOUNTER — Ambulatory Visit: Payer: BC Managed Care – PPO

## 2020-05-31 ENCOUNTER — Encounter: Payer: Self-pay | Admitting: Radiation Oncology

## 2020-05-31 ENCOUNTER — Encounter: Payer: Self-pay | Admitting: Oncology

## 2020-05-31 ENCOUNTER — Telehealth: Payer: Self-pay | Admitting: *Deleted

## 2020-05-31 NOTE — Progress Notes (Signed)
  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 Her vision did improve during treatment and she also had improvement of pain.  Plan: The patient will receive a follow-up phone call with radiation oncology in about 1 month. At completion of treatment she was encouraged to follow up sooner if she has questions or concerns. Otherwise she will continue follow up with Dr. Jana Hakim in medical oncology.  ________________________________________________    Carola Rhine, PAC

## 2020-05-31 NOTE — Telephone Encounter (Signed)
This RN called pt per pharmacy inquiry about dental evaluation for start of xgeva-  GM ask her to- she has not followed thru - didn't realize the necessity related to the xgeva- I reviewed- she is calling a dentist to keep an evaluation for dental clearance due to need to use bisphosphonates- she understands if not able to be seen before 2/10 we will reschedule the injection-   Pharmacy made aware of above

## 2020-06-01 ENCOUNTER — Encounter: Payer: Self-pay | Admitting: Oncology

## 2020-06-01 ENCOUNTER — Other Ambulatory Visit: Payer: BC Managed Care – PPO

## 2020-06-04 ENCOUNTER — Encounter: Payer: Self-pay | Admitting: Oncology

## 2020-06-05 MED ORDER — AZITHROMYCIN 250 MG PO TABS: ORAL_TABLET | ORAL | 0 refills | Status: DC

## 2020-06-06 ENCOUNTER — Encounter: Payer: Self-pay | Admitting: Oncology

## 2020-06-06 ENCOUNTER — Telehealth: Payer: Self-pay

## 2020-06-06 DIAGNOSIS — M279 Disease of jaws, unspecified: Secondary | ICD-10-CM | POA: Diagnosis not present

## 2020-06-06 NOTE — Telephone Encounter (Signed)
Patient called to have upcoming Xgeva appointment cancelled due to dental biopsy being scheduled for 2/10 @ 8am.   Patient was scheduled for 1st dose of Xgeva on 2/10 -  Patient will follow up with MD on 3/10 -  RN educated patient that MD will be made aware, and we will keep follow up as scheduled before additional injection.  Pt verbalized understanding and agreement.

## 2020-06-07 ENCOUNTER — Inpatient Hospital Stay: Payer: BC Managed Care – PPO

## 2020-06-07 ENCOUNTER — Inpatient Hospital Stay: Payer: BC Managed Care – PPO | Admitting: Adult Health

## 2020-06-07 DIAGNOSIS — M279 Disease of jaws, unspecified: Secondary | ICD-10-CM | POA: Diagnosis not present

## 2020-06-08 ENCOUNTER — Telehealth: Payer: Self-pay | Admitting: *Deleted

## 2020-06-08 ENCOUNTER — Encounter: Payer: Self-pay | Admitting: Oncology

## 2020-06-08 ENCOUNTER — Encounter: Payer: Self-pay | Admitting: Hematology and Oncology

## 2020-06-08 NOTE — Telephone Encounter (Signed)
Left a voicemail for the patient to let her know we received her message.  I discussed with Dr. Lisbeth Renshaw and he thinks it would be safe for her to see the Optometrist about a month from radiation completion.  This would put her at about early to mid March.  Call back number [phone]) left in the event she has further questions or concerns.  [name]. Leonie Green, BSN

## 2020-06-09 ENCOUNTER — Encounter: Payer: Self-pay | Admitting: Oncology

## 2020-06-11 ENCOUNTER — Telehealth: Payer: Self-pay

## 2020-06-11 DIAGNOSIS — C50919 Malignant neoplasm of unspecified site of unspecified female breast: Secondary | ICD-10-CM

## 2020-06-11 NOTE — Telephone Encounter (Signed)
Pt contacted about lab appointment and next start date for Ibrance. Based on next date pt to come in for labs tomorrow 06/12/20. Pt verbalized understanding. Message sent to scheduling / lab orders in.

## 2020-06-12 ENCOUNTER — Inpatient Hospital Stay: Payer: BC Managed Care – PPO | Attending: Oncology

## 2020-06-12 ENCOUNTER — Telehealth: Payer: Self-pay | Admitting: *Deleted

## 2020-06-12 ENCOUNTER — Other Ambulatory Visit: Payer: Self-pay

## 2020-06-12 DIAGNOSIS — C50812 Malignant neoplasm of overlapping sites of left female breast: Secondary | ICD-10-CM | POA: Insufficient documentation

## 2020-06-12 DIAGNOSIS — C50912 Malignant neoplasm of unspecified site of left female breast: Secondary | ICD-10-CM

## 2020-06-12 DIAGNOSIS — C50919 Malignant neoplasm of unspecified site of unspecified female breast: Secondary | ICD-10-CM

## 2020-06-12 LAB — CMP (CANCER CENTER ONLY)
ALT: 15 U/L (ref 0–44)
AST: 24 U/L (ref 15–41)
Albumin: 3.4 g/dL — ABNORMAL LOW (ref 3.5–5.0)
Alkaline Phosphatase: 115 U/L (ref 38–126)
Anion gap: 9 (ref 5–15)
BUN: 10 mg/dL (ref 6–20)
CO2: 24 mmol/L (ref 22–32)
Calcium: 9.3 mg/dL (ref 8.9–10.3)
Chloride: 107 mmol/L (ref 98–111)
Creatinine: 0.81 mg/dL (ref 0.44–1.00)
GFR, Estimated: >60 mL/min (ref >60)
Glucose, Bld: 89 mg/dL (ref 70–99)
Potassium: 4.2 mmol/L (ref 3.5–5.1)
Sodium: 140 mmol/L (ref 135–145)
Total Bilirubin: 0.2 mg/dL — ABNORMAL LOW (ref 0.3–1.2)
Total Protein: 7.7 g/dL (ref 6.5–8.1)

## 2020-06-12 LAB — CBC WITH DIFFERENTIAL (CANCER CENTER ONLY)
Abs Immature Granulocytes: 0 10*3/uL (ref 0.00–0.07)
Basophils Absolute: 0 10*3/uL (ref 0.0–0.1)
Basophils Relative: 1 %
Eosinophils Absolute: 0 10*3/uL (ref 0.0–0.5)
Eosinophils Relative: 0 %
HCT: 28.6 % — ABNORMAL LOW (ref 36.0–46.0)
Hemoglobin: 8.8 g/dL — ABNORMAL LOW (ref 12.0–15.0)
Immature Granulocytes: 0 %
Lymphocytes Relative: 58 %
Lymphs Abs: 1.6 10*3/uL (ref 0.7–4.0)
MCH: 26.3 pg (ref 26.0–34.0)
MCHC: 30.8 g/dL (ref 30.0–36.0)
MCV: 85.6 fL (ref 80.0–100.0)
Monocytes Absolute: 0.3 10*3/uL (ref 0.1–1.0)
Monocytes Relative: 11 %
Neutro Abs: 0.8 10*3/uL — ABNORMAL LOW (ref 1.7–7.7)
Neutrophils Relative %: 30 %
Platelet Count: 160 10*3/uL (ref 150–400)
RBC: 3.34 MIL/uL — ABNORMAL LOW (ref 3.87–5.11)
RDW: 22.9 % — ABNORMAL HIGH (ref 11.5–15.5)
WBC Count: 2.8 10*3/uL — ABNORMAL LOW (ref 4.0–10.5)
nRBC: 0 % (ref 0.0–0.2)

## 2020-06-12 LAB — SAMPLE TO BLOOD BANK

## 2020-06-12 NOTE — Telephone Encounter (Signed)
Spoke with the patient regarding a voicemail she left.  After discussion with the PA it was felt that she needed to follow-up with ophthalmology.  I asked that she give [place] a call following the appointment and have the office to send over notes from the visit.  She verbalized understanding.  Will continue to follow as necessary.  [name]. Leonie Green, BSN

## 2020-06-13 ENCOUNTER — Encounter: Payer: Self-pay | Admitting: Oncology

## 2020-06-13 DIAGNOSIS — C50919 Malignant neoplasm of unspecified site of unspecified female breast: Secondary | ICD-10-CM

## 2020-06-13 DIAGNOSIS — C771 Secondary and unspecified malignant neoplasm of intrathoracic lymph nodes: Secondary | ICD-10-CM

## 2020-06-13 LAB — CANCER ANTIGEN 27.29: CA 27.29: 177.2 U/mL — ABNORMAL HIGH (ref 0.0–38.6)

## 2020-06-20 ENCOUNTER — Encounter: Payer: Self-pay | Admitting: Oncology

## 2020-06-20 ENCOUNTER — Other Ambulatory Visit: Payer: Self-pay

## 2020-06-20 ENCOUNTER — Inpatient Hospital Stay: Payer: BC Managed Care – PPO

## 2020-06-20 DIAGNOSIS — C50912 Malignant neoplasm of unspecified site of left female breast: Secondary | ICD-10-CM

## 2020-06-20 DIAGNOSIS — C50812 Malignant neoplasm of overlapping sites of left female breast: Secondary | ICD-10-CM | POA: Diagnosis not present

## 2020-06-20 DIAGNOSIS — C50919 Malignant neoplasm of unspecified site of unspecified female breast: Secondary | ICD-10-CM

## 2020-06-20 DIAGNOSIS — C771 Secondary and unspecified malignant neoplasm of intrathoracic lymph nodes: Secondary | ICD-10-CM

## 2020-06-20 LAB — CBC WITH DIFFERENTIAL (CANCER CENTER ONLY)
Abs Immature Granulocytes: 0.01 10*3/uL (ref 0.00–0.07)
Basophils Absolute: 0 10*3/uL (ref 0.0–0.1)
Basophils Relative: 1 %
Eosinophils Absolute: 0 10*3/uL (ref 0.0–0.5)
Eosinophils Relative: 0 %
HCT: 30.9 % — ABNORMAL LOW (ref 36.0–46.0)
Hemoglobin: 9.4 g/dL — ABNORMAL LOW (ref 12.0–15.0)
Immature Granulocytes: 0 %
Lymphocytes Relative: 58 %
Lymphs Abs: 1.6 10*3/uL (ref 0.7–4.0)
MCH: 25.9 pg — ABNORMAL LOW (ref 26.0–34.0)
MCHC: 30.4 g/dL (ref 30.0–36.0)
MCV: 85.1 fL (ref 80.0–100.0)
Monocytes Absolute: 0.4 10*3/uL (ref 0.1–1.0)
Monocytes Relative: 13 %
Neutro Abs: 0.8 10*3/uL — ABNORMAL LOW (ref 1.7–7.7)
Neutrophils Relative %: 28 %
Platelet Count: 295 10*3/uL (ref 150–400)
RBC: 3.63 MIL/uL — ABNORMAL LOW (ref 3.87–5.11)
RDW: 23.6 % — ABNORMAL HIGH (ref 11.5–15.5)
WBC Count: 2.8 10*3/uL — ABNORMAL LOW (ref 4.0–10.5)
nRBC: 0 % (ref 0.0–0.2)

## 2020-06-20 LAB — CMP (CANCER CENTER ONLY)
ALT: 18 U/L (ref 0–44)
AST: 25 U/L (ref 15–41)
Albumin: 3.6 g/dL (ref 3.5–5.0)
Alkaline Phosphatase: 113 U/L (ref 38–126)
Anion gap: 6 (ref 5–15)
BUN: 9 mg/dL (ref 6–20)
CO2: 26 mmol/L (ref 22–32)
Calcium: 9.8 mg/dL (ref 8.9–10.3)
Chloride: 109 mmol/L (ref 98–111)
Creatinine: 0.76 mg/dL (ref 0.44–1.00)
GFR, Estimated: >60 mL/min (ref >60)
Glucose, Bld: 88 mg/dL (ref 70–99)
Potassium: 4.3 mmol/L (ref 3.5–5.1)
Sodium: 141 mmol/L (ref 135–145)
Total Bilirubin: 0.2 mg/dL — ABNORMAL LOW (ref 0.3–1.2)
Total Protein: 8 g/dL (ref 6.5–8.1)

## 2020-06-20 LAB — SAMPLE TO BLOOD BANK

## 2020-06-21 ENCOUNTER — Encounter: Payer: Self-pay | Admitting: Oncology

## 2020-06-21 LAB — CANCER ANTIGEN 27.29: CA 27.29: 181.4 U/mL — ABNORMAL HIGH (ref 0.0–38.6)

## 2020-06-22 DIAGNOSIS — M279 Disease of jaws, unspecified: Secondary | ICD-10-CM | POA: Diagnosis not present

## 2020-06-25 ENCOUNTER — Encounter: Payer: Self-pay | Admitting: Adult Health

## 2020-06-28 ENCOUNTER — Encounter: Payer: Self-pay | Admitting: Oncology

## 2020-06-28 ENCOUNTER — Telehealth: Payer: Self-pay

## 2020-06-28 DIAGNOSIS — H4942 Progressive external ophthalmoplegia, left eye: Secondary | ICD-10-CM | POA: Diagnosis not present

## 2020-06-28 DIAGNOSIS — H5700 Unspecified anomaly of pupillary function: Secondary | ICD-10-CM | POA: Diagnosis not present

## 2020-06-28 DIAGNOSIS — H16212 Exposure keratoconjunctivitis, left eye: Secondary | ICD-10-CM | POA: Diagnosis not present

## 2020-06-28 NOTE — Telephone Encounter (Signed)
Contacted pt to see if she had gotten dental clearance for her xgeva . Pt states she has been to the dentist and that they actually did a biopsy. Pt to contact dental office for results and call me back.

## 2020-06-29 ENCOUNTER — Encounter: Payer: Self-pay | Admitting: Oncology

## 2020-06-29 NOTE — Progress Notes (Signed)
From pt regarding dental clearance: Pathology report shows benign lesion in bone which is very common. NO signs of cancer.    [facility] [phone]

## 2020-07-02 ENCOUNTER — Inpatient Hospital Stay
Admission: RE | Admit: 2020-07-02 | Discharge: 2020-07-02 | Disposition: A | Discharge status: Home / Self Care | Payer: BC Managed Care – PPO | Source: Ambulatory Visit

## 2020-07-02 DIAGNOSIS — C7951 Secondary malignant neoplasm of bone: Secondary | ICD-10-CM

## 2020-07-04 NOTE — Progress Notes (Signed)
[name]  [phone] [phone]) [phone]      ID: [name] OB: December 07, 1975  MR#: [id number]  XQJ#:[id number]  Patient Care Team: Maurice Small, MD as PCP - General (Family Medicine) Gracianna Vink, Virgie Dad, MD as Consulting Physician (Oncology) Alphonsa Overall, MD as Consulting Physician (General Surgery) Gavin Pound, MD as Consulting Physician (Rheumatology) Katy Fitch, Darlina Guys, MD as Consulting Physician (Ophthalmology) Kyung Rudd, MD as Consulting Physician (Radiation Oncology) OTHER MD: Annette Stable [phone])  CHIEF COMPLAINT:  Stage IV breast cancer, estrogen receptor positive (s/p bilateral mastectomies)  CURRENT TREATMENT:  anastrozole, palbociclib, denosumab   INTERVAL HISTORY: [name] returns today for follow up of her stage IV breast cancer.  Since her last visit, she received radiation therapy to the metastasis in left orbit/globe from 05/11/2020 through 05/30/2020 under Dr. Lisbeth Renshaw.  This has made a significant difference and she has much less swelling of the left periorbital region region.  She still has some blurred vision.  She is working with Dr. Carolynn Sayers who [name] tells me is putting in a "plug" to increase tearing, and adding Restasis and a steroid.  She continues on anastrozole, which she started 05/01/2020.  She tolerates this with some hot flashes but no other significant side effects  She also continues on palbociclib.  This has been a problem because of continuing low counts.  The low counts are most likely due to bone marrow involvement by her tumor.  For that reason we changed her dose the current cycle 225 mg every other day.  When she begins the next cycle she will drop the dose to 75 mg daily as discussed below.  We are following her CA 27-29 Results for [name], [name] (MRN [id number]) as of 07/05/2020 16:06  Ref. Range 05/11/2020 09:36 06/12/2020 13:30 06/20/2020 09:37  CA 27.29 Latest Ref Range: 0.0 - 38.6 U/mL 155.5  (H) 177.2 (H) 181.4 (H)   REVIEW OF SYSTEMS: [name] tells me she has bought a treadmill but only uses it about every other day so far.  She wants to use a daily.  She is doing 15 minutes currently without much concerned.  She tells me the lesion in her mouth was biopsied and was not cancer.  She has no evidence of osteonecrosis by recent dental evaluation.  Aside from these issues a detailed review of systems today was stable   COVID 19 VACCINATION STATUS: Not vaccinated as of 05/24/2020   BREAST CANCER HISTORY:  From the original intake note:  [name] had bilateral diagnostic mammography [facility] in [place] 11/30/2009 showing a 1.5 cm mass at the 9:00 position of the left breast with some satellites. A biopsy of the mass in question [facility] showed ([id number]) and invasive lobular carcinoma, which was estrogen and progesterone receptor both 100% positive, both with strong staining intensity, but HER-2 negative at 1+. Bilateral breast MRI 12/13/2009 in [place] showed in the left breast an irregularly marginated mass measuring 3.2 cm. There were 4 or 5 separate satellite lesions laterally and superior to the primary.  Accordingly, after appropriate discussion, the patient underwent bilateral mastectomies with left sentinel lymph node sampling 01/02/2010. The right breast was benign. The left breast showed a 2.3 cm invasive lobular carcinoma, grade 3, with a total of 6 sentinel lymph nodes removed, all negative, although 2 showed isolated tumor cells by immunostaining. Margins were negative.  An Oncotype BX was sent, with a recurrence score of 22, predicting a risk of distant recurrence within  10 years with 14% of the patients only systemic treatment was tamoxifen. The patient was treated with CMF chemotherapy between 02/22/2010 and 07/05/2010, receiving 7 cycles at which point the patient refused further chemotherapy though there have been no major  toxicities at according to the oncology note from Dr. Brent General. The patient was then started on tamoxifen 08/09/2010. By her cancer took approximately 12-15 months then stopped because of hot flashes and insomnia problems.  More recently, the patient noted a change in her left axilla andunderwent bilateral breast MRI January to 2015 at [place]. The patient has bilateral submuscular silicone implants in place. The breast were unremarkable, but there were several lymph nodes with cortical thickening in the left axilla including a dominant 1 measuring 1.2 cm in the short axis. Ultrasonography of this area identified the lymph node in question and the patient underwent biopsy of the left axillary lymph node This showed ([id number]) near-total replacement of the lymph node by metastatic carcinoma. This was 96% estrogen receptor positive, and 84% progesterone receptor positive, both with strong staining intensity. The MIB-1 was 20%. HER-2 determination is pending.  The patient's subsequent history is as detailed below   PAST MEDICAL HISTORY: Past Medical History:  Diagnosis Date  . Back muscle spasm    occasional tx with skelaxin  . Cancer ([place])    breast ca  . GERD (gastroesophageal reflux disease)   . Malignant neoplasm of breast (female), unspecified site   . Radiation 10/24/13-12/09/13   Left chestwall/supraclav./scar    PAST SURGICAL HISTORY: Past Surgical History:  Procedure Laterality Date  . AXILLARY LYMPH NODE DISSECTION Left 06/16/2013   Procedure: LEFT AXILLARY NODE DISSECTION;  Surgeon: Shann Medal, MD;  Location: [facility];  Service: General;  Laterality: Left;  . BILATERAL TOTAL MASTECTOMY WITH AXILLARY LYMPH NODE DISSECTION    . BREAST IMPLANT REMOVAL Bilateral 03/18/2018   Procedure: REMOVAL BREAST IMPLANTS;  Surgeon: Wallace Going, DO;  Location: [facility];  Service: Plastics;  Laterality: Bilateral;  . BREAST SURGERY    . CAPSULECTOMY Bilateral  03/18/2018   Procedure: CAPSULECTOMY;  Surgeon: Wallace Going, DO;  Location: [facility];  Service: Plastics;  Laterality: Bilateral;  . LAPAROSCOPIC BILATERAL SALPINGO OOPHERECTOMY Bilateral 01/25/2014   Procedure: LAPAROSCOPIC BILATERAL SALPINGO OOPHORECTOMY;  Surgeon: Eldred Manges, MD;  Location: [facility];  Service: Gynecology;  Laterality: Bilateral;  . LEEP    . PLACEMENT OF BREAST IMPLANTS Bilateral 03/18/2018   Procedure: PLACEMENT OF BREAST IMPLANTS;  Surgeon: Wallace Going, DO;  Location: [facility];  Service: Plastics;  Laterality: Bilateral;  . PORT-A-CATH REMOVAL N/A 01/25/2014   Procedure: REMOVAL PORT-A-CATH;  Surgeon: Alphonsa Overall, MD;  Location: [facility];  Service: General;  Laterality: N/A;  . PORTACATH PLACEMENT N/A 06/16/2013   Procedure: POWER PORT PLACEMENT;  Surgeon: Shann Medal, MD;  Location: [facility];  Service: General;  Laterality: N/A;  . right leg surgery     broken femur - has rod in leg  . TONSILLECTOMY    . WISDOM TOOTH EXTRACTION      FAMILY HISTORY Family History  Problem Relation Age of Onset  . Other Mother 13       glioblastoma; deceased 90  . Cancer Mother   . Other Father 73       glioblastoma; deceased 1  . Cancer Father   . Hypertension Other    according to the patient both her parents died from a primary brain  cancers, namely we have the stomas, her father at 81, her mother at 22. The patient had one brother and one sister. There is no history of breast or ovarian cancer in the family   GYNECOLOGIC HISTORY:   (Reviewed 10/10/2013) Menarche age 62. The patient has never carried a child to term. She was still having regular periods at the time of her recent diagnosis. LMP 07/18/2013 as of 09/08/2013.  The patient took oral contraceptives between the ages of 47 and 66 with no complications. She stopped having periods with her chemotherapy in 2015.    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 She currently works as a Research scientist (physical sciences).  Her husband Chrissie Noa Hutchinso works at [employer] and also in [place]: In the absence of any documents to the contrary the patient's husband is her healthcare power of attorney   HEALTH MAINTENANCE:  Social History   Tobacco Use  . Smoking status: Never Smoker  . Smokeless tobacco: Never Used  Substance Use Topics  . Alcohol use: Yes    Comment: socially wine  . Drug use: No     Colonoscopy: Never  PAP: December 2014  Bone density: Never  Lipid panel: Not on file   Allergies  Allergen Reactions  . Sulfa Antibiotics Hives and Itching  . Gadolinium Derivatives Hives    After gado injection, pt had a few small hives on left breast area. Treated with benadryl by dr Janeece Fitting   . Petrolatum Hives and Rash    Current Outpatient Medications  Medication Sig Dispense Refill  . acetaminophen (TYLENOL) 500 MG tablet Take 1 tablet (500 mg total) by mouth 3 (three) times daily as needed for headache. Take with aleve 220 mg 30 tablet 0  . anastrozole (ARIMIDEX) 1 MG tablet Take 1 tablet (1 mg total) by mouth daily. 90 tablet 4  . ibuprofen (ADVIL) 200 MG tablet Take 600 mg by mouth every 6 (six) hours as needed for headache or moderate pain. (Patient not taking: Reported on 04/23/2020)    . naproxen sodium (ALEVE) 220 MG tablet Take 1 tablet (220 mg total) by mouth 3 (three) times daily as needed. 90 tablet 3  . palbociclib (IBRANCE) 75 MG tablet Take 1 tablet (75 mg total) by mouth daily. Take for 21 days on, 7 days off, repeat every 28 days. Start Thursday July 26, 2020 21 tablet 6  . polyethylene glycol (MIRALAX) 17 g packet Take 17 g by mouth daily. 14 each 0  . traMADol (ULTRAM) 50 MG tablet Take 1-2 tablets (50-100 mg total) by mouth every 6 (six) hours as needed. 60 tablet 0   No current  facility-administered medications for this visit.    OBJECTIVE: African-American woman who appears stated age  45:   07/05/20 1348  BP: 133/73  Pulse: 90  Resp: 20  Temp: 97.7 F (36.5 C)  SpO2: 100%   Wt Readings from Last 3 Encounters:  07/05/20 187 lb 14.4 oz (85.2 kg)  05/11/20 178 lb 8 oz (81 kg)  05/01/20 180 lb (81.6 kg)   Body mass index is 28.57 kg/m.    ECOG FS:1 - Symptomatic but completely ambulatory  Sclerae unicteric, residual mild ptosis left side Wearing a mask No cervical or supraclavicular adenopathy Lungs no rales or rhonchi Heart regular rate and rhythm Abd soft, nontender, positive bowel  sounds MSK no focal spinal tenderness, no upper extremity lymphedema Neuro: nonfocal, well oriented, appropriate affect Breasts: Status post bilateral mastectomies with bilateral implants in place no evidence of local recurrence.  Both axillae are benign   LAB RESULTS: Lab Results  Component Value Date   WBC 2.3 (L) 07/05/2020   NEUTROABS 0.9 (L) 07/05/2020   HGB 9.4 (L) 07/05/2020   HCT 30.6 (L) 07/05/2020   MCV 87.4 07/05/2020   PLT 243 07/05/2020      Chemistry      Component Value Date/Time   NA 139 07/05/2020 1146   NA 141 10/30/2016 0813   K 3.6 07/05/2020 1146   K 4.0 10/30/2016 0813   CL 109 07/05/2020 1146   CO2 25 07/05/2020 1146   CO2 26 10/30/2016 0813   BUN 9 07/05/2020 1146   BUN 15.2 10/30/2016 0813   CREATININE 0.81 07/05/2020 1146   CREATININE 0.8 10/30/2016 0813      Component Value Date/Time   CALCIUM 9.5 07/05/2020 1146   CALCIUM 10.0 10/30/2016 0813   ALKPHOS 114 07/05/2020 1146   ALKPHOS 59 10/30/2016 0813   AST 21 07/05/2020 1146   AST 17 10/30/2016 0813   ALT 20 07/05/2020 1146   ALT 11 10/30/2016 0813   BILITOT 0.3 07/05/2020 1146   BILITOT 0.26 10/30/2016 0813      STUDIES: No results found.   ASSESSMENT: 45 y.o. BRCA negative [place] woman  (1) status post bilateral mastectomies with left axillary  lymph node sampling [6 nodes removed] 01/02/2010 for a pT2 pN0(i+), stage IIA invasive lobular breast cancer, grade 3, estrogen and progesterone receptor positive, HER-2 not amplified  (a) right breast was benign  (2) Oncotype DX score of 22 predicted a 14% risk of distant recurrence within 10 years if the patient's only systemic treatment is tamoxifen for 5 years  (3) status post CMF(cyclophosphamide, fluorouracil, methotrexate) x7 given between October of 2011 and March of 2012 (7 of 8 planned treatments completed)  (4) tamoxifen started April 2012, discontinued approximately July of 2013 (about 15 months) because of problems with hot flashes and insomnia  (5) pathologically documented left axillary recurrence 05/05/2013, the tumor being again estrogen and progesterone receptor positive, with HER-2 not amplified, and MIB-1 of 15%. Staging CT/PET 05/27/2013 show left axillary and supraclavicular recurrence, no distant disease  (6) status post completion left axillary lymph node dissection 06/16/2013 showing a total of 10/15 lymph nodes involved by tumor, with evidence of extracapsular extension (TX N3 = stage IIIC)  (7) treated with carboplatin/ docetaxel, first dose on 07/11/2013, repeated every 21 days x 4 with Neulasta support on day 2.  Final dose was given on 09/12/2013, with chemotherapy discontinued after 4 cycles due to continuing low counts.  (8) radiation completed 12/09/2013: Left chest wall / 50.4 Gray @ 1.8 Gray per fraction x 28 fractions Left Supraclavicular fossa and PAB/ 45 Gray _0 .8 Gray per fraction x 25 fractions Left scar / 10 Gray at [employer] per fraction x 5 fractions   (9) bilateral salpingo-oophorectomy on 01/25/14  (10)   lymphedema and limited range of motion in the left upper extremity secondary to left axillary lymph node dissection, being treated through the lymphedema clinic  - improved   (13) sequencing and deletion/duplication analysis of 17 genes performed  September 2015 at [facility] showed no deleterious mutations in  ATM, BARD1, BRCA1, BRCA2, BRIP1, CDH1, CHEK2, MRE11A, MUTYH, NBN, NF1, PALB2, PTEN, RAD50, RAD51C, RAD51D, and TP53.  (14) tamoxifen re-started  02/27/2014, discontinued December 2021 with progression  (15) bilateral breast implant exchange with capsulotomies 03/18/2018 following left implant rupture and bilateral capsular contractures. Left - [employer] Smooth Round Ultra High Profile Gel 590cc. Ref #[id number].  Serial Number [id number] Right - [employer] Smooth Round Ultra High Profile Gel 590cc. Ref #[id number].  Serial Number [id number]  METASTATIC DISEASE: (16) bone marrow biopsy 04/23/2020 shows metastatic carcinoma in the marrow, estrogen and progesterone receptor positive  (a) MRI of the cervical spine 04/07/2020 shows markedly abnormal marrow signal, prominent right axillary lymph nodes, degenerative disc disease  (b) MRI of the orbits 04/13/2020 suggests metastatic disease around the left orbit and diffusely abnormal bone marrow signal  (c) CT scan of the chest, abdomen and pelvis 05/10/2020 shows extensive adenopathy (right axillary, prevascular, right hilar, celiac and portacaval axes), multiple liver masses, multiple sclerotic bone lesions  (d) bone scan 05/10/2020 shows uptake through the pelvis spine sternum and ribs and possibly the right mandible.  Consistent with widespread bony metastatic disease  (e) brain MRI 05/09/2020 shows no evidence of intracranial metastases, with left periorbital disease as previously documented  (f) CA 27-29 is informative (was 155.5 05/11/2020)  (17) radiation to the left periorbital region to be completed 05/31/2020  (18) anastrozole started 05/01/2020  (a) palbociclib started 05/11/2020  (b) denosumab/ Xgeva started 07/05/2020   PLAN: [name] is now approximately 3 months out from initial diagnosis of metastatic disease.  Clinically she is improved.  Of course the left periorbital  area which received radiation is considerably better and she is very appreciative of the help she is getting through Dr. Carolynn Sayers.  In addition though the pain she had intermittently in the right upper abdomen has resolved.  She is pain-free at present.  She has excellent energy and she generally is tolerating treatment well.  The fact that the CA 27-29 was still rising when last checked is a concern but on the other hand the alkaline phosphatase has been dropping.  A second concern of course is the continuing low counts.  This is probably going to continue given the likely marrow infiltration by tumor and we are simply going to have to push through it.  She will complete the current cycle of palbociclib next week.  She will then be off 2 weeks and beginning on 07/26/2020 she will start palbociclib at 75 mg daily, 21 days on and 7 days off.  I am hopeful she will be able to tolerate that dose and eventually we might be able to actually increase the dose if we get a response in the bone marrow as elsewhere.  Today we discussed the possible aches and pains that she may experience from the denosumab/Xgeva and what she can do to take care of the pain if it develops.  Total encounter time 35 minutes.*  Amberlea Spagnuolo, Virgie Dad, MD  07/05/20 4:01 PM Medical Oncology and Hematology [facility] [place], [place] [address] Tel. [phone]    Fax. [phone]   I, Wilburn Mylar, am acting as scribe for Dr. Virgie Dad. Audreena Sachdeva.  I, Lurline Del MD, have reviewed the above documentation for accuracy and completeness, and I agree with the above.   *Total Encounter Time as defined by the Centers for Medicare and Medicaid Services includes, in addition to the face-to-face time of a patient visit (documented in the note above) non-face-to-face time: obtaining and reviewing outside history, ordering and reviewing medications, tests or procedures, care coordination (communications  with other health care professionals  or caregivers) and documentation in the medical record.

## 2020-07-05 ENCOUNTER — Encounter: Payer: Self-pay | Admitting: Oncology

## 2020-07-05 ENCOUNTER — Inpatient Hospital Stay (HOSPITAL_BASED_OUTPATIENT_CLINIC_OR_DEPARTMENT_OTHER): Payer: BC Managed Care – PPO | Admitting: Oncology

## 2020-07-05 ENCOUNTER — Inpatient Hospital Stay: Payer: BC Managed Care – PPO

## 2020-07-05 ENCOUNTER — Inpatient Hospital Stay: Payer: BC Managed Care – PPO | Attending: Oncology

## 2020-07-05 ENCOUNTER — Other Ambulatory Visit: Payer: Self-pay

## 2020-07-05 ENCOUNTER — Other Ambulatory Visit: Payer: Self-pay | Admitting: Pharmacist

## 2020-07-05 VITALS — BP 133/73 | HR 90 | Temp 97.7°F | Resp 20 | Ht 68.0 in | Wt 187.9 lb

## 2020-07-05 DIAGNOSIS — C50912 Malignant neoplasm of unspecified site of left female breast: Secondary | ICD-10-CM

## 2020-07-05 DIAGNOSIS — H5700 Unspecified anomaly of pupillary function: Secondary | ICD-10-CM | POA: Diagnosis not present

## 2020-07-05 DIAGNOSIS — C50812 Malignant neoplasm of overlapping sites of left female breast: Secondary | ICD-10-CM

## 2020-07-05 DIAGNOSIS — Z79811 Long term (current) use of aromatase inhibitors: Secondary | ICD-10-CM | POA: Diagnosis not present

## 2020-07-05 DIAGNOSIS — H4942 Progressive external ophthalmoplegia, left eye: Secondary | ICD-10-CM | POA: Diagnosis not present

## 2020-07-05 DIAGNOSIS — Z170 Estrogen receptor positive status [ER+]: Secondary | ICD-10-CM

## 2020-07-05 DIAGNOSIS — H524 Presbyopia: Secondary | ICD-10-CM | POA: Diagnosis not present

## 2020-07-05 DIAGNOSIS — H52203 Unspecified astigmatism, bilateral: Secondary | ICD-10-CM | POA: Diagnosis not present

## 2020-07-05 DIAGNOSIS — C787 Secondary malignant neoplasm of liver and intrahepatic bile duct: Secondary | ICD-10-CM

## 2020-07-05 DIAGNOSIS — H16212 Exposure keratoconjunctivitis, left eye: Secondary | ICD-10-CM | POA: Diagnosis not present

## 2020-07-05 DIAGNOSIS — C771 Secondary and unspecified malignant neoplasm of intrathoracic lymph nodes: Secondary | ICD-10-CM

## 2020-07-05 DIAGNOSIS — C50919 Malignant neoplasm of unspecified site of unspecified female breast: Secondary | ICD-10-CM

## 2020-07-05 DIAGNOSIS — D649 Anemia, unspecified: Secondary | ICD-10-CM

## 2020-07-05 DIAGNOSIS — C7951 Secondary malignant neoplasm of bone: Secondary | ICD-10-CM

## 2020-07-05 DIAGNOSIS — H5213 Myopia, bilateral: Secondary | ICD-10-CM | POA: Diagnosis not present

## 2020-07-05 DIAGNOSIS — H04123 Dry eye syndrome of bilateral lacrimal glands: Secondary | ICD-10-CM | POA: Diagnosis not present

## 2020-07-05 LAB — CBC WITH DIFFERENTIAL (CANCER CENTER ONLY)
Abs Immature Granulocytes: 0.01 10*3/uL (ref 0.00–0.07)
Basophils Absolute: 0 10*3/uL (ref 0.0–0.1)
Basophils Relative: 0 %
Eosinophils Absolute: 0 10*3/uL (ref 0.0–0.5)
Eosinophils Relative: 1 %
HCT: 30.6 % — ABNORMAL LOW (ref 36.0–46.0)
Hemoglobin: 9.4 g/dL — ABNORMAL LOW (ref 12.0–15.0)
Immature Granulocytes: 0 %
Lymphocytes Relative: 57 %
Lymphs Abs: 1.3 10*3/uL (ref 0.7–4.0)
MCH: 26.9 pg (ref 26.0–34.0)
MCHC: 30.7 g/dL (ref 30.0–36.0)
MCV: 87.4 fL (ref 80.0–100.0)
Monocytes Absolute: 0.1 10*3/uL (ref 0.1–1.0)
Monocytes Relative: 4 %
Neutro Abs: 0.9 10*3/uL — ABNORMAL LOW (ref 1.7–7.7)
Neutrophils Relative %: 38 %
Platelet Count: 243 10*3/uL (ref 150–400)
RBC: 3.5 MIL/uL — ABNORMAL LOW (ref 3.87–5.11)
RDW: 24.2 % — ABNORMAL HIGH (ref 11.5–15.5)
WBC Count: 2.3 10*3/uL — ABNORMAL LOW (ref 4.0–10.5)
nRBC: 0 % (ref 0.0–0.2)

## 2020-07-05 LAB — CMP (CANCER CENTER ONLY)
ALT: 20 U/L (ref 0–44)
AST: 21 U/L (ref 15–41)
Albumin: 3.7 g/dL (ref 3.5–5.0)
Alkaline Phosphatase: 114 U/L (ref 38–126)
Anion gap: 5 (ref 5–15)
BUN: 9 mg/dL (ref 6–20)
CO2: 25 mmol/L (ref 22–32)
Calcium: 9.5 mg/dL (ref 8.9–10.3)
Chloride: 109 mmol/L (ref 98–111)
Creatinine: 0.81 mg/dL (ref 0.44–1.00)
GFR, Estimated: >60 mL/min (ref >60)
Glucose, Bld: 123 mg/dL — ABNORMAL HIGH (ref 70–99)
Potassium: 3.6 mmol/L (ref 3.5–5.1)
Sodium: 139 mmol/L (ref 135–145)
Total Bilirubin: 0.3 mg/dL (ref 0.3–1.2)
Total Protein: 8.1 g/dL (ref 6.5–8.1)

## 2020-07-05 LAB — SAMPLE TO BLOOD BANK

## 2020-07-05 LAB — ABO/RH: ABO/RH(D): O POS

## 2020-07-05 MED ORDER — PALBOCICLIB 75 MG PO TABS: 75.0000 mg | ORAL_TABLET | Freq: Every day | ORAL | 6 refills | Status: DC

## 2020-07-05 MED ORDER — DENOSUMAB 120 MG/1.7ML ~~LOC~~ SOLN
120.0000 mg | Freq: Once | SUBCUTANEOUS | Status: AC
  Administered 2020-07-05: 120 mg via SUBCUTANEOUS

## 2020-07-05 NOTE — Patient Instructions (Signed)
Denosumab injection What is this medicine? DENOSUMAB (den oh sue mab) slows bone breakdown. Prolia is used to treat osteoporosis in women after menopause and in men, and in people who are taking corticosteroids for 6 months or more. Xgeva is used to treat a high calcium level due to cancer and to prevent bone fractures and other bone problems caused by multiple myeloma or cancer bone metastases. Xgeva is also used to treat giant cell tumor of the bone. This medicine may be used for other purposes; ask your health care provider or pharmacist if you have questions. COMMON BRAND NAME(S): Prolia, XGEVA What should I tell my health care provider before I take this medicine? They need to know if you have any of these conditions:  dental disease  having surgery or tooth extraction  infection  kidney disease  low levels of calcium or Vitamin D in the blood  malnutrition  on hemodialysis  skin conditions or sensitivity  thyroid or parathyroid disease  an unusual reaction to denosumab, other medicines, foods, dyes, or preservatives  pregnant or trying to get pregnant  breast-feeding How should I use this medicine? This medicine is for injection under the skin. It is given by a health care professional in a hospital or clinic setting. A special MedGuide will be given to you before each treatment. Be sure to read this information carefully each time. For Prolia, talk to your pediatrician regarding the use of this medicine in children. Special care may be needed. For Xgeva, talk to your pediatrician regarding the use of this medicine in children. While this drug may be prescribed for children as young as 13 years for selected conditions, precautions do apply. Overdosage: If you think you have taken too much of this medicine contact a poison control center or emergency room at once. NOTE: This medicine is only for you. Do not share this medicine with others. What if I miss a dose? It is  important not to miss your dose. Call your doctor or health care professional if you are unable to keep an appointment. What may interact with this medicine? Do not take this medicine with any of the following medications:  other medicines containing denosumab This medicine may also interact with the following medications:  medicines that lower your chance of fighting infection  steroid medicines like prednisone or cortisone This list may not describe all possible interactions. Give your health care provider a list of all the medicines, herbs, non-prescription drugs, or dietary supplements you use. Also tell them if you smoke, drink alcohol, or use illegal drugs. Some items may interact with your medicine. What should I watch for while using this medicine? Visit your doctor or health care professional for regular checks on your progress. Your doctor or health care professional may order blood tests and other tests to see how you are doing. Call your doctor or health care professional for advice if you get a fever, chills or sore throat, or other symptoms of a cold or flu. Do not treat yourself. This drug may decrease your body's ability to fight infection. Try to avoid being around people who are sick. You should make sure you get enough calcium and vitamin D while you are taking this medicine, unless your doctor tells you not to. Discuss the foods you eat and the vitamins you take with your health care professional. See your dentist regularly. Brush and floss your teeth as directed. Before you have any dental work done, tell your dentist you are   receiving this medicine. Do not become pregnant while taking this medicine or for 5 months after stopping it. Talk with your doctor or health care professional about your birth control options while taking this medicine. Women should inform their doctor if they wish to become pregnant or think they might be pregnant. There is a potential for serious side  effects to an unborn child. Talk to your health care professional or pharmacist for more information. What side effects may I notice from receiving this medicine? Side effects that you should report to your doctor or health care professional as soon as possible:  allergic reactions like skin rash, itching or hives, swelling of the face, lips, or tongue  bone pain  breathing problems  dizziness  jaw pain, especially after dental work  redness, blistering, peeling of the skin  signs and symptoms of infection like fever or chills; cough; sore throat; pain or trouble passing urine  signs of low calcium like fast heartbeat, muscle cramps or muscle pain; pain, tingling, numbness in the hands or feet; seizures  unusual bleeding or bruising  unusually weak or tired Side effects that usually do not require medical attention (report to your doctor or health care professional if they continue or are bothersome):  constipation  diarrhea  headache  joint pain  loss of appetite  muscle pain  runny nose  tiredness  upset stomach This list may not describe all possible side effects. Call your doctor for medical advice about side effects. You may report side effects to FDA at 1-800-FDA-1088. Where should I keep my medicine? This medicine is only given in a clinic, doctor's office, or other health care setting and will not be stored at home. NOTE: This sheet is a summary. It may not cover all possible information. If you have questions about this medicine, talk to your doctor, pharmacist, or health care provider.  2021 Elsevier/Gold Standard (2017-08-21 16:10:44)

## 2020-07-05 NOTE — Progress Notes (Signed)
Oral Oncology Pharmacist Encounter  Prescription for Ibrance sent to [facility] in error. Patient's insurance requires that Ibrance be filled through [facility]. Prescription redirected to [facility] for dispensing.  Leron Croak, PharmD, BCPS Hematology/Oncology Clinical Pharmacist [facility] [phone] 07/05/2020 2:45 PM

## 2020-07-05 NOTE — Progress Notes (Signed)
Dr. Jana Hakim aware pt had biopsy w/ oral surgeon.  OK to give Xgeva today.  Kennith Center, Pharm.D., CPP 07/05/2020@3 :26 PM

## 2020-07-06 ENCOUNTER — Encounter: Payer: Self-pay | Admitting: Oncology

## 2020-07-06 LAB — CANCER ANTIGEN 27.29: CA 27.29: 163.5 U/mL — ABNORMAL HIGH (ref 0.0–38.6)

## 2020-07-09 DIAGNOSIS — Z1211 Encounter for screening for malignant neoplasm of colon: Secondary | ICD-10-CM | POA: Diagnosis not present

## 2020-07-09 DIAGNOSIS — C50919 Malignant neoplasm of unspecified site of unspecified female breast: Secondary | ICD-10-CM | POA: Diagnosis not present

## 2020-07-09 DIAGNOSIS — Z6830 Body mass index (BMI) 30.0-30.9, adult: Secondary | ICD-10-CM | POA: Diagnosis not present

## 2020-07-09 DIAGNOSIS — Z01419 Encounter for gynecological examination (general) (routine) without abnormal findings: Secondary | ICD-10-CM | POA: Diagnosis not present

## 2020-07-12 DIAGNOSIS — H16212 Exposure keratoconjunctivitis, left eye: Secondary | ICD-10-CM | POA: Diagnosis not present

## 2020-07-12 DIAGNOSIS — H5700 Unspecified anomaly of pupillary function: Secondary | ICD-10-CM | POA: Diagnosis not present

## 2020-07-12 DIAGNOSIS — H4942 Progressive external ophthalmoplegia, left eye: Secondary | ICD-10-CM | POA: Diagnosis not present

## 2020-07-12 DIAGNOSIS — H04123 Dry eye syndrome of bilateral lacrimal glands: Secondary | ICD-10-CM | POA: Diagnosis not present

## 2020-07-16 ENCOUNTER — Telehealth: Payer: Self-pay | Admitting: Pharmacist

## 2020-07-16 NOTE — Telephone Encounter (Signed)
Oral Oncology Pharmacist Encounter   Prior Authorization for Ibrance (palbociclib) has been approved.     [facility] #: [id number] Effective dates: 07/16/2020 through 07/15/2021   Leron Croak, PharmD, BCPS Hematology/Oncology Clinical Pharmacist [facility] [phone] 07/16/2020 10:44 AM

## 2020-07-16 NOTE — Telephone Encounter (Signed)
Oral Oncology Pharmacist Encounter  Received notification from [employer] that prior authorization for Leslee Home is required.   PA submitted on 07/16/20 Key [id number] Status is pending   Oral Oncology Clinic will continue to follow.  Leron Croak, PharmD, BCPS Hematology/Oncology Clinical Pharmacist [facility] [phone] 07/16/2020 10:43 AM

## 2020-07-17 DIAGNOSIS — K219 Gastro-esophageal reflux disease without esophagitis: Secondary | ICD-10-CM | POA: Diagnosis not present

## 2020-07-17 DIAGNOSIS — Z1211 Encounter for screening for malignant neoplasm of colon: Secondary | ICD-10-CM | POA: Diagnosis not present

## 2020-07-18 ENCOUNTER — Encounter: Payer: Self-pay | Admitting: Oncology

## 2020-07-19 ENCOUNTER — Encounter: Payer: Self-pay | Admitting: Oncology

## 2020-07-19 ENCOUNTER — Other Ambulatory Visit: Payer: Self-pay

## 2020-07-19 DIAGNOSIS — C50812 Malignant neoplasm of overlapping sites of left female breast: Secondary | ICD-10-CM

## 2020-07-19 DIAGNOSIS — Z170 Estrogen receptor positive status [ER+]: Secondary | ICD-10-CM

## 2020-07-19 MED ORDER — PALBOCICLIB 75 MG PO TABS: 75.0000 mg | ORAL_TABLET | Freq: Every day | ORAL | 6 refills | Status: DC

## 2020-07-20 ENCOUNTER — Encounter: Payer: Self-pay | Admitting: Oncology

## 2020-07-23 ENCOUNTER — Other Ambulatory Visit: Payer: Self-pay | Admitting: Oncology

## 2020-08-01 NOTE — Progress Notes (Signed)
[facility]  [phone] [phone]      ID: [name] OB: 1976/04/07  MR#: [id number]  RTM#:[id number]  Patient Care Team: Maurice Small, MD as PCP - General (Family Medicine) Mackynzie Woolford, Virgie Dad, MD as Consulting Physician (Oncology) Alphonsa Overall, MD as Consulting Physician (General Surgery) Gavin Pound, MD as Consulting Physician (Rheumatology) Katy Fitch, Darlina Guys, MD as Consulting Physician (Ophthalmology) Kyung Rudd, MD as Consulting Physician (Radiation Oncology) OTHER MD: Annette Stable [phone])  CHIEF COMPLAINT:  Stage IV breast cancer, estrogen receptor positive (s/p bilateral mastectomies)  CURRENT TREATMENT:  anastrozole, palbociclib, denosumab   INTERVAL HISTORY: [name] returns today for follow up of her stage IV breast cancer.  She continues on anastrozole, which she started 05/01/2020.  She tolerates this with occasional hot flashes but no other significant side effects  She also continues on palbociclib.  This has been a problem because of continuing low counts.  The low counts are most likely due to bone marrow involvement by her tumor.  For that reason we changed her dose the current cycle to 125 mg every other day with cycle 2 and now to 75 mg daily which I hope will be a tolerable dose for her.  We are following her CA 27-29 Lab Results  Component Value Date   CA2729 163.5 (H) 07/05/2020   CA2729 181.4 (H) 06/20/2020   CA2729 177.2 (H) 06/12/2020   CA2729 155.5 (H) 05/11/2020   She notes she is scheduled for colonoscopy on 09/03/2020.  This was scheduled by her gynecologist and [name] knows there is no urgency regarding that from our point of view   REVIEW OF SYSTEMS: Fiza continues to work full-time.  She is now taking no pain medicines and really no supportive medicines other than the actual treatment she is receiving.  She tells me she no longer has double vision.  She can see pretty well out of the  left eye close-up but a little further out, about 12 feet, it gets a little bit blurry.  She has a new pair of glasses and they have helped.  She denies headaches, denies nausea or vomiting, denies problems with balance or falls.  She has no cough phlegm production or pleurisy.  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  A detailed review of systems today was otherwise stable   COVID 19 VACCINATION STATUS: Not vaccinated as of 05/24/2020   BREAST CANCER HISTORY:  From the original intake note:  [name] had bilateral diagnostic mammography [facility] in [place] 11/30/2009 showing a 1.5 cm mass at the 9:00 position of the left breast with some satellites. A biopsy of the mass in question [facility] showed ([id number]) and invasive lobular carcinoma, which was estrogen and progesterone receptor both 100% positive, both with strong staining intensity, but HER-2 negative at 1+. Bilateral breast MRI 12/13/2009 in [place] showed in the left breast an irregularly marginated mass measuring 3.2 cm. There were 4 or 5 separate satellite lesions laterally and superior to the primary.  Accordingly, after appropriate discussion, the patient underwent bilateral mastectomies with left sentinel lymph node sampling 01/02/2010. The right breast was benign. The left breast showed a 2.3 cm invasive lobular carcinoma, grade 3, with a total of 6 sentinel lymph nodes removed, all negative, although 2 showed isolated tumor cells by immunostaining. Margins were negative.  An Oncotype BX was sent, with a recurrence score of 22, predicting a risk of distant  recurrence within 10 years with 14% of the patients only systemic treatment was tamoxifen. The patient was treated with CMF chemotherapy between 02/22/2010 and 07/05/2010, receiving 7 cycles at which point the patient refused further chemotherapy though there have been no major toxicities at  according to the oncology note from Dr. Brent General. The patient was then started on tamoxifen 08/09/2010. By her cancer took approximately 12-15 months then stopped because of hot flashes and insomnia problems.  More recently, the patient noted a change in her left axilla andunderwent bilateral breast MRI January to 2015 at [place]. The patient has bilateral submuscular silicone implants in place. The breast were unremarkable, but there were several lymph nodes with cortical thickening in the left axilla including a dominant 1 measuring 1.2 cm in the short axis. Ultrasonography of this area identified the lymph node in question and the patient underwent biopsy of the left axillary lymph node This showed ([id number]) near-total replacement of the lymph node by metastatic carcinoma. This was 96% estrogen receptor positive, and 84% progesterone receptor positive, both with strong staining intensity. The MIB-1 was 20%. HER-2 determination is pending.  The patient's subsequent history is as detailed below   PAST MEDICAL HISTORY: Past Medical History:  Diagnosis Date  . Back muscle spasm    occasional tx with skelaxin  . Cancer ([place])    breast ca  . GERD (gastroesophageal reflux disease)   . Malignant neoplasm of breast (female), unspecified site   . Radiation 10/24/13-12/09/13   Left chestwall/supraclav./scar    PAST SURGICAL HISTORY: Past Surgical History:  Procedure Laterality Date  . AXILLARY LYMPH NODE DISSECTION Left 06/16/2013   Procedure: LEFT AXILLARY NODE DISSECTION;  Surgeon: Shann Medal, MD;  Location: [facility];  Service: General;  Laterality: Left;  . BILATERAL TOTAL MASTECTOMY WITH AXILLARY LYMPH NODE DISSECTION    . BREAST IMPLANT REMOVAL Bilateral 03/18/2018   Procedure: REMOVAL BREAST IMPLANTS;  Surgeon: Wallace Going, DO;  Location: [facility];  Service: Plastics;  Laterality: Bilateral;  . BREAST SURGERY    . CAPSULECTOMY Bilateral 03/18/2018    Procedure: CAPSULECTOMY;  Surgeon: Wallace Going, DO;  Location: [facility];  Service: Plastics;  Laterality: Bilateral;  . LAPAROSCOPIC BILATERAL SALPINGO OOPHERECTOMY Bilateral 01/25/2014   Procedure: LAPAROSCOPIC BILATERAL SALPINGO OOPHORECTOMY;  Surgeon: Eldred Manges, MD;  Location: [facility];  Service: Gynecology;  Laterality: Bilateral;  . LEEP    . PLACEMENT OF BREAST IMPLANTS Bilateral 03/18/2018   Procedure: PLACEMENT OF BREAST IMPLANTS;  Surgeon: Wallace Going, DO;  Location: [facility];  Service: Plastics;  Laterality: Bilateral;  . PORT-A-CATH REMOVAL N/A 01/25/2014   Procedure: REMOVAL PORT-A-CATH;  Surgeon: Alphonsa Overall, MD;  Location: [facility];  Service: General;  Laterality: N/A;  . PORTACATH PLACEMENT N/A 06/16/2013   Procedure: POWER PORT PLACEMENT;  Surgeon: Shann Medal, MD;  Location: [facility];  Service: General;  Laterality: N/A;  . right leg surgery     broken femur - has rod in leg  . TONSILLECTOMY    . WISDOM TOOTH EXTRACTION      FAMILY HISTORY Family History  Problem Relation Age of Onset  . Other Mother 49       glioblastoma; deceased 23  . Cancer Mother   . Other Father 91       glioblastoma; deceased 4  . Cancer Father   . Hypertension Other    according to the patient both her parents died from a  primary brain cancers, namely we have the stomas, her father at 73, her mother at 19. The patient had one brother and one sister. There is no history of breast or ovarian cancer in the family   GYNECOLOGIC HISTORY:   (Reviewed 10/10/2013) Menarche age 29. The patient has never carried a child to term. She was still having regular periods at the time of her recent diagnosis. LMP 07/18/2013 as of 09/08/2013.  The patient took oral contraceptives between the ages of 37 and 6 with no complications. She stopped having periods with her chemotherapy in 2015.    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 She currently works from [employer] for [employer]Marland Kitchen  Her husband Burnice Logan works at [employer] and also in [place]: In the absence of any documents to the contrary the patient's husband is her healthcare power of attorney   HEALTH MAINTENANCE:  Social History   Tobacco Use  . Smoking status: Never Smoker  . Smokeless tobacco: Never Used  Substance Use Topics  . Alcohol use: Yes    Comment: socially wine  . Drug use: No     Colonoscopy: Never  PAP: December 2014  Bone density: Never  Lipid panel: Not on file   Allergies  Allergen Reactions  . Sulfa Antibiotics Hives and Itching  . Gadolinium Derivatives Hives    After gado injection, pt had a few small hives on left breast area. Treated with benadryl by dr Janeece Fitting   . Petrolatum Hives and Rash    Current Outpatient Medications  Medication Sig Dispense Refill  . acetaminophen (TYLENOL) 500 MG tablet Take 1 tablet (500 mg total) by mouth 3 (three) times daily as needed for headache. Take with aleve 220 mg 30 tablet 0  . anastrozole (ARIMIDEX) 1 MG tablet Take 1 tablet (1 mg total) by mouth daily. 90 tablet 4  . ibuprofen (ADVIL) 200 MG tablet Take 600 mg by mouth every 6 (six) hours as needed for headache or moderate pain. (Patient not taking: Reported on 04/23/2020)    . naproxen sodium (ALEVE) 220 MG tablet Take 1 tablet (220 mg total) by mouth 3 (three) times daily as needed. 90 tablet 3  . palbociclib (IBRANCE) 75 MG tablet Take 1 tablet (75 mg total) by mouth daily. Take for 21 days on, 7 days off, repeat every 28 days. Start Thursday July 26, 2020 21 tablet 6  . polyethylene glycol (MIRALAX) 17 g packet Take 17 g by mouth daily. 14 each 0  . traMADol (ULTRAM) 50 MG tablet Take 1-2 tablets (50-100 mg total) by mouth every 6 (six) hours as needed.  60 tablet 0   No current facility-administered medications for this visit.    OBJECTIVE: African-American woman who appears stated age  45:   08/02/20 1218  BP: 123/70  Pulse: 83  Resp: 18  Temp: (!) 97.5 F (36.4 C)  SpO2: 100%   Wt Readings from Last 3 Encounters:  08/02/20 190 lb 14.4 oz (86.6 kg)  07/05/20 187 lb 14.4 oz (85.2 kg)  05/11/20 178 lb 8 oz (81 kg)   Body mass index is 29.03 kg/m.    ECOG FS:1 - Symptomatic but completely ambulatory  Sclerae unicteric, EOMs intact Wearing a mask No cervical or supraclavicular adenopathy Lungs no rales or rhonchi Heart regular  rate and rhythm Abd soft, nontender, positive bowel sounds MSK no focal spinal tenderness, no upper extremity lymphedema Neuro: nonfocal, well oriented, appropriate affect Breasts: Deferred   LAB RESULTS: Lab Results  Component Value Date   WBC 2.1 (L) 08/02/2020   NEUTROABS PENDING 08/02/2020   HGB 9.4 (L) 08/02/2020   HCT 30.1 (L) 08/02/2020   MCV 91.2 08/02/2020   PLT 285 08/02/2020      Chemistry      Component Value Date/Time   NA 139 07/05/2020 1146   NA 141 10/30/2016 0813   K 3.6 07/05/2020 1146   K 4.0 10/30/2016 0813   CL 109 07/05/2020 1146   CO2 25 07/05/2020 1146   CO2 26 10/30/2016 0813   BUN 9 07/05/2020 1146   BUN 15.2 10/30/2016 0813   CREATININE 0.81 07/05/2020 1146   CREATININE 0.8 10/30/2016 0813      Component Value Date/Time   CALCIUM 9.5 07/05/2020 1146   CALCIUM 10.0 10/30/2016 0813   ALKPHOS 114 07/05/2020 1146   ALKPHOS 59 10/30/2016 0813   AST 21 07/05/2020 1146   AST 17 10/30/2016 0813   ALT 20 07/05/2020 1146   ALT 11 10/30/2016 0813   BILITOT 0.3 07/05/2020 1146   BILITOT 0.26 10/30/2016 0813      STUDIES: No results found.   ASSESSMENT: 45 y.o. BRCA negative [place], [place] woman  (1) status post bilateral mastectomies with left axillary lymph node sampling [6 nodes removed] 01/02/2010 for a pT2 pN0(i+), stage IIA invasive lobular  breast cancer, grade 3, estrogen and progesterone receptor positive, HER-2 not amplified  (a) right breast was benign  (2) Oncotype DX score of 22 predicted a 14% risk of distant recurrence within 10 years if the patient's only systemic treatment is tamoxifen for 5 years  (3) status post CMF(cyclophosphamide, fluorouracil, methotrexate) x7 given between October of 2011 and March of 2012 (7 of 8 planned treatments completed)  (4) tamoxifen started April 2012, discontinued approximately July of 2013 (about 15 months) because of problems with hot flashes and insomnia  (5) pathologically documented left axillary recurrence 05/05/2013, the tumor being again estrogen and progesterone receptor positive, with HER-2 not amplified, and MIB-1 of 15%. Staging CT/PET 05/27/2013 show left axillary and supraclavicular recurrence, no distant disease  (6) status post completion left axillary lymph node dissection 06/16/2013 showing a total of 10/15 lymph nodes involved by tumor, with evidence of extracapsular extension (TX N3 = stage IIIC)  (7) treated with carboplatin/ docetaxel, first dose on 07/11/2013, repeated every 21 days x 4 with Neulasta support on day 2.  Final dose was given on 09/12/2013, with chemotherapy discontinued after 4 cycles due to continuing low counts.  (8) radiation completed 12/09/2013: Left chest wall / 50.4 Gray @ 1.8 Gray per fraction x 28 fractions Left Supraclavicular fossa and PAB/ 45 Gray @1 .8 Gray per fraction x 25 fractions Left scar / 10 Gray at [employer] per fraction x 5 fractions   (9) bilateral salpingo-oophorectomy on 01/25/14  (10)   lymphedema and limited range of motion in the left upper extremity secondary to left axillary lymph node dissection, being treated through the lymphedema clinic  - improved   (13) sequencing and deletion/duplication analysis of 17 genes performed September 2015 at [facility] showed no deleterious mutations in  ATM, BARD1, BRCA1, BRCA2,  BRIP1, CDH1, CHEK2, MRE11A, MUTYH, NBN, NF1, PALB2, PTEN, RAD50, RAD51C, RAD51D, and TP53.  (14) tamoxifen re-started 02/27/2014, discontinued December 2021 with progression  (15) bilateral breast implant  exchange with capsulotomies 03/18/2018 following left implant rupture and bilateral capsular contractures. Left - [employer]or Smooth Round Ultra High Profile Gel 590cc. Ref #[id number].  Serial Number [id number] Right - [employer]or Smooth Round Ultra High Profile Gel 590cc. Ref #[id number].  Serial Number [id number]  METASTATIC DISEASE: (16) bone marrow biopsy 04/23/2020 shows metastatic carcinoma in the marrow, estrogen and progesterone receptor positive  (a) MRI of the cervical spine 04/07/2020 shows markedly abnormal marrow signal, prominent right axillary lymph nodes, degenerative disc disease  (b) MRI of the orbits 04/13/2020 suggests metastatic disease around the left orbit and diffusely abnormal bone marrow signal  (c) CT scan of the chest, abdomen and pelvis 05/10/2020 shows extensive adenopathy (right axillary, prevascular, right hilar, celiac and portacaval axes), multiple liver masses, multiple sclerotic bone lesions  (d) bone scan 05/10/2020 shows uptake through the pelvis spine sternum and ribs and possibly the right mandible.  Consistent with widespread bony metastatic disease  (e) brain MRI 05/09/2020 shows no evidence of intracranial metastases, with left periorbital disease as previously documented  (f) CA 27-29 is informative (was 155.5 05/11/2020)  (17) radiation to the left periorbital region completed 05/31/2020  (18) anastrozole started 05/01/2020  (a) palbociclib started 05/11/2020  (b) dose reduced with second cycle to 75 mg daily due to cytopenias  (19) denosumab/ Xgeva started 07/05/2020, repeated every 28 d   PLAN: [name] is now in her fourth month after definitive diagnosis of metastatic breast cancer.  She is tolerating her treatment well except for cytopenias with the  palbociclib and we have lowered her dose accordingly.  She greatly benefited from palliative radiation to the left orbital area and I am delighted that she continues to work full-time and to be physically very active.  At this point I think it might be a good idea to obtain a new set of scans to document response.  We will set those up for early May and she will see me with her next denosumab/Xgeva dose on 08/30/2020.  She is using her treadmill about 3 times a week and I encouraged her to continue that and if possible increase the frequency.  She knows to call for any other issue that may develop before the next visit.  Total encounter time 30 minutes.*   [name] Barrett, Virgie Dad, MD  08/02/20 12:37 PM Medical Oncology and Hematology [facility] [place], [place] [address] Tel. [phone]    Fax. [phone]   I, Wilburn Mylar, am acting as scribe for Dr. Virgie Dad. [name].  I, Lurline Del MD, have reviewed the above documentation for accuracy and completeness, and I agree with the above.   *Total Encounter Time as defined by the Centers for Medicare and Medicaid Services includes, in addition to the face-to-face time of a patient visit (documented in the note above) non-face-to-face time: obtaining and reviewing outside history, ordering and reviewing medications, tests or procedures, care coordination (communications with other health care professionals or caregivers) and documentation in the medical record.

## 2020-08-02 ENCOUNTER — Other Ambulatory Visit: Payer: Self-pay

## 2020-08-02 ENCOUNTER — Inpatient Hospital Stay (HOSPITAL_BASED_OUTPATIENT_CLINIC_OR_DEPARTMENT_OTHER): Payer: BC Managed Care – PPO | Admitting: Oncology

## 2020-08-02 ENCOUNTER — Inpatient Hospital Stay: Payer: BC Managed Care – PPO

## 2020-08-02 ENCOUNTER — Inpatient Hospital Stay: Payer: BC Managed Care – PPO | Attending: Oncology

## 2020-08-02 VITALS — BP 123/70 | HR 83 | Temp 97.5°F | Resp 18 | Ht 68.0 in | Wt 190.9 lb

## 2020-08-02 DIAGNOSIS — C7951 Secondary malignant neoplasm of bone: Secondary | ICD-10-CM | POA: Insufficient documentation

## 2020-08-02 DIAGNOSIS — C50812 Malignant neoplasm of overlapping sites of left female breast: Secondary | ICD-10-CM

## 2020-08-02 DIAGNOSIS — Z79899 Other long term (current) drug therapy: Secondary | ICD-10-CM | POA: Insufficient documentation

## 2020-08-02 DIAGNOSIS — C787 Secondary malignant neoplasm of liver and intrahepatic bile duct: Secondary | ICD-10-CM

## 2020-08-02 DIAGNOSIS — Z170 Estrogen receptor positive status [ER+]: Secondary | ICD-10-CM

## 2020-08-02 DIAGNOSIS — C50911 Malignant neoplasm of unspecified site of right female breast: Secondary | ICD-10-CM

## 2020-08-02 DIAGNOSIS — C50919 Malignant neoplasm of unspecified site of unspecified female breast: Secondary | ICD-10-CM

## 2020-08-02 DIAGNOSIS — C773 Secondary and unspecified malignant neoplasm of axilla and upper limb lymph nodes: Secondary | ICD-10-CM

## 2020-08-02 DIAGNOSIS — C771 Secondary and unspecified malignant neoplasm of intrathoracic lymph nodes: Secondary | ICD-10-CM

## 2020-08-02 DIAGNOSIS — C50912 Malignant neoplasm of unspecified site of left female breast: Secondary | ICD-10-CM

## 2020-08-02 LAB — SAMPLE TO BLOOD BANK

## 2020-08-02 LAB — CMP (CANCER CENTER ONLY)
ALT: 23 U/L (ref 0–44)
AST: 24 U/L (ref 15–41)
Albumin: 3.7 g/dL (ref 3.5–5.0)
Alkaline Phosphatase: 101 U/L (ref 38–126)
Anion gap: 11 (ref 5–15)
BUN: 12 mg/dL (ref 6–20)
CO2: 23 mmol/L (ref 22–32)
Calcium: 8.8 mg/dL — ABNORMAL LOW (ref 8.9–10.3)
Chloride: 109 mmol/L (ref 98–111)
Creatinine: 0.81 mg/dL (ref 0.44–1.00)
GFR, Estimated: >60 mL/min (ref >60)
Glucose, Bld: 99 mg/dL (ref 70–99)
Potassium: 4.1 mmol/L (ref 3.5–5.1)
Sodium: 143 mmol/L (ref 135–145)
Total Bilirubin: 0.3 mg/dL (ref 0.3–1.2)
Total Protein: 8.1 g/dL (ref 6.5–8.1)

## 2020-08-02 LAB — CBC WITH DIFFERENTIAL (CANCER CENTER ONLY)
Abs Immature Granulocytes: 0 10*3/uL (ref 0.00–0.07)
Basophils Absolute: 0 10*3/uL (ref 0.0–0.1)
Basophils Relative: 1 %
Eosinophils Absolute: 0 10*3/uL (ref 0.0–0.5)
Eosinophils Relative: 2 %
HCT: 30.1 % — ABNORMAL LOW (ref 36.0–46.0)
Hemoglobin: 9.4 g/dL — ABNORMAL LOW (ref 12.0–15.0)
Immature Granulocytes: 0 %
Lymphocytes Relative: 52 %
Lymphs Abs: 1.1 10*3/uL (ref 0.7–4.0)
MCH: 28.5 pg (ref 26.0–34.0)
MCHC: 31.2 g/dL (ref 30.0–36.0)
MCV: 91.2 fL (ref 80.0–100.0)
Monocytes Absolute: 0.1 10*3/uL (ref 0.1–1.0)
Monocytes Relative: 4 %
Neutro Abs: 0.9 10*3/uL — ABNORMAL LOW (ref 1.7–7.7)
Neutrophils Relative %: 41 %
Platelet Count: 285 10*3/uL (ref 150–400)
RBC: 3.3 MIL/uL — ABNORMAL LOW (ref 3.87–5.11)
RDW: 20.9 % — ABNORMAL HIGH (ref 11.5–15.5)
WBC Count: 2.1 10*3/uL — ABNORMAL LOW (ref 4.0–10.5)
nRBC: 0 % (ref 0.0–0.2)

## 2020-08-02 MED ORDER — DENOSUMAB 120 MG/1.7ML ~~LOC~~ SOLN
120.0000 mg | Freq: Once | SUBCUTANEOUS | Status: AC
  Administered 2020-08-02: 120 mg via SUBCUTANEOUS

## 2020-08-02 MED ORDER — DENOSUMAB 120 MG/1.7ML ~~LOC~~ SOLN
SUBCUTANEOUS | Status: AC
  Filled 2020-08-02: qty 1.7

## 2020-08-02 NOTE — Patient Instructions (Signed)
Denosumab injection What is this medicine? DENOSUMAB (den oh sue mab) slows bone breakdown. Prolia is used to treat osteoporosis in women after menopause and in men, and in people who are taking corticosteroids for 6 months or more. Xgeva is used to treat a high calcium level due to cancer and to prevent bone fractures and other bone problems caused by multiple myeloma or cancer bone metastases. Xgeva is also used to treat giant cell tumor of the bone. This medicine may be used for other purposes; ask your health care provider or pharmacist if you have questions. COMMON BRAND NAME(S): Prolia, XGEVA What should I tell my health care provider before I take this medicine? They need to know if you have any of these conditions:  dental disease  having surgery or tooth extraction  infection  kidney disease  low levels of calcium or Vitamin D in the blood  malnutrition  on hemodialysis  skin conditions or sensitivity  thyroid or parathyroid disease  an unusual reaction to denosumab, other medicines, foods, dyes, or preservatives  pregnant or trying to get pregnant  breast-feeding How should I use this medicine? This medicine is for injection under the skin. It is given by a health care professional in a hospital or clinic setting. A special MedGuide will be given to you before each treatment. Be sure to read this information carefully each time. For Prolia, talk to your pediatrician regarding the use of this medicine in children. Special care may be needed. For Xgeva, talk to your pediatrician regarding the use of this medicine in children. While this drug may be prescribed for children as young as 13 years for selected conditions, precautions do apply. Overdosage: If you think you have taken too much of this medicine contact a poison control center or emergency room at once. NOTE: This medicine is only for you. Do not share this medicine with others. What if I miss a dose? It is  important not to miss your dose. Call your doctor or health care professional if you are unable to keep an appointment. What may interact with this medicine? Do not take this medicine with any of the following medications:  other medicines containing denosumab This medicine may also interact with the following medications:  medicines that lower your chance of fighting infection  steroid medicines like prednisone or cortisone This list may not describe all possible interactions. Give your health care provider a list of all the medicines, herbs, non-prescription drugs, or dietary supplements you use. Also tell them if you smoke, drink alcohol, or use illegal drugs. Some items may interact with your medicine. What should I watch for while using this medicine? Visit your doctor or health care professional for regular checks on your progress. Your doctor or health care professional may order blood tests and other tests to see how you are doing. Call your doctor or health care professional for advice if you get a fever, chills or sore throat, or other symptoms of a cold or flu. Do not treat yourself. This drug may decrease your body's ability to fight infection. Try to avoid being around people who are sick. You should make sure you get enough calcium and vitamin D while you are taking this medicine, unless your doctor tells you not to. Discuss the foods you eat and the vitamins you take with your health care professional. See your dentist regularly. Brush and floss your teeth as directed. Before you have any dental work done, tell your dentist you are   receiving this medicine. Do not become pregnant while taking this medicine or for 5 months after stopping it. Talk with your doctor or health care professional about your birth control options while taking this medicine. Women should inform their doctor if they wish to become pregnant or think they might be pregnant. There is a potential for serious side  effects to an unborn child. Talk to your health care professional or pharmacist for more information. What side effects may I notice from receiving this medicine? Side effects that you should report to your doctor or health care professional as soon as possible:  allergic reactions like skin rash, itching or hives, swelling of the face, lips, or tongue  bone pain  breathing problems  dizziness  jaw pain, especially after dental work  redness, blistering, peeling of the skin  signs and symptoms of infection like fever or chills; cough; sore throat; pain or trouble passing urine  signs of low calcium like fast heartbeat, muscle cramps or muscle pain; pain, tingling, numbness in the hands or feet; seizures  unusual bleeding or bruising  unusually weak or tired Side effects that usually do not require medical attention (report to your doctor or health care professional if they continue or are bothersome):  constipation  diarrhea  headache  joint pain  loss of appetite  muscle pain  runny nose  tiredness  upset stomach This list may not describe all possible side effects. Call your doctor for medical advice about side effects. You may report side effects to FDA at 1-800-FDA-1088. Where should I keep my medicine? This medicine is only given in a clinic, doctor's office, or other health care setting and will not be stored at home. NOTE: This sheet is a summary. It may not cover all possible information. If you have questions about this medicine, talk to your doctor, pharmacist, or health care provider.  2021 Elsevier/Gold Standard (2017-08-21 16:10:44)

## 2020-08-03 ENCOUNTER — Telehealth: Payer: Self-pay

## 2020-08-03 ENCOUNTER — Encounter: Payer: Self-pay | Admitting: Oncology

## 2020-08-03 LAB — CANCER ANTIGEN 27.29: CA 27.29: 127.7 U/mL — ABNORMAL HIGH (ref 0.0–38.6)

## 2020-08-03 NOTE — Telephone Encounter (Signed)
Pt scheduled for CT chest, abd/pelvis 08/29/2020 at 0930. Pt understands to pick 2 bottles of oral contrast up at [facility] before scan. Drink first bottle at 0730 and second bottle at 0830, also arrive 15 min before appt. Pt verbalized thanks and understanding.

## 2020-08-07 ENCOUNTER — Telehealth: Payer: Self-pay | Admitting: Adult Health

## 2020-08-07 NOTE — Telephone Encounter (Signed)
R/s appt per 4/8 sch msg. Called pt, no answer. Left msg with appt date and time.

## 2020-08-14 ENCOUNTER — Encounter: Payer: Self-pay | Admitting: Oncology

## 2020-08-21 DIAGNOSIS — H16212 Exposure keratoconjunctivitis, left eye: Secondary | ICD-10-CM | POA: Diagnosis not present

## 2020-08-21 DIAGNOSIS — H5700 Unspecified anomaly of pupillary function: Secondary | ICD-10-CM | POA: Diagnosis not present

## 2020-08-29 ENCOUNTER — Ambulatory Visit (HOSPITAL_COMMUNITY)
Admission: RE | Admit: 2020-08-29 | Discharge: 2020-08-29 | Disposition: A | Discharge status: Home / Self Care | Payer: BC Managed Care – PPO | Source: Ambulatory Visit | Attending: Oncology | Admitting: Oncology

## 2020-08-29 ENCOUNTER — Encounter (HOSPITAL_COMMUNITY): Payer: Self-pay

## 2020-08-29 ENCOUNTER — Other Ambulatory Visit: Payer: Self-pay

## 2020-08-29 DIAGNOSIS — C50919 Malignant neoplasm of unspecified site of unspecified female breast: Secondary | ICD-10-CM | POA: Diagnosis not present

## 2020-08-29 DIAGNOSIS — C50812 Malignant neoplasm of overlapping sites of left female breast: Secondary | ICD-10-CM | POA: Diagnosis not present

## 2020-08-29 DIAGNOSIS — Z170 Estrogen receptor positive status [ER+]: Secondary | ICD-10-CM | POA: Diagnosis not present

## 2020-08-29 DIAGNOSIS — C50911 Malignant neoplasm of unspecified site of right female breast: Secondary | ICD-10-CM | POA: Diagnosis not present

## 2020-08-29 DIAGNOSIS — C7951 Secondary malignant neoplasm of bone: Secondary | ICD-10-CM | POA: Diagnosis not present

## 2020-08-29 DIAGNOSIS — C771 Secondary and unspecified malignant neoplasm of intrathoracic lymph nodes: Secondary | ICD-10-CM

## 2020-08-29 DIAGNOSIS — C787 Secondary malignant neoplasm of liver and intrahepatic bile duct: Secondary | ICD-10-CM

## 2020-08-29 MED ORDER — IOHEXOL 300 MG/ML  SOLN
100.0000 mL | Freq: Once | INTRAMUSCULAR | Status: AC | PRN
  Administered 2020-08-29: 100 mL via INTRAVENOUS

## 2020-08-29 MED ORDER — SODIUM CHLORIDE (PF) 0.9 % IJ SOLN
INTRAMUSCULAR | Status: AC
  Filled 2020-08-29: qty 50

## 2020-08-29 NOTE — Progress Notes (Signed)
[place]  [phone] Fax:([phone]) [phone]      ID: [name] OB: 04-Jul-1975  MR#: [id number]  CSN#:[id number]  Patient Care Team: Jana Hakim Virgie Dad, MD as PCP - General (Oncology) [facility], Virgie Dad, MD as Consulting Physician (Oncology) Alphonsa Overall, MD as Consulting Physician (General Surgery) Gavin Pound, MD as Consulting Physician (Rheumatology) Katy Fitch, Darlina Guys, MD as Consulting Physician (Ophthalmology) Kyung Rudd, MD as Consulting Physician (Radiation Oncology) OTHER MD: Annette Stable [phone])  CHIEF COMPLAINT:  Stage IV breast cancer, estrogen receptor positive (s/p bilateral mastectomies)  CURRENT TREATMENT:  anastrozole, palbociclib, denosumab   INTERVAL HISTORY: [name] returns today for follow up of her stage IV breast cancer.  Since her last visit, she underwent restaging CT chest, abdomen, pelvis yesterday, 08/29/2020, showing:  1. Decreasing size of nodal and visceral metastases. Borderline enlarged lymph node in the RIGHT axilla and perihepatic irregularity warranting attention on follow-up. 2. Borderline pelvic nodal enlargement also warrants attention on follow-up but is decreased in size. 3. Diffuse skeletal metastatic disease with sclerosis is unchanged. 4. Cholelithiasis.  She continues on anastrozole, which she started 05/01/2020.  She tolerates this with occasional hot flashes but no other significant side effects  She also continues on palbociclib.  This has been a problem because of continuing low counts.  The low counts are most likely due to bone marrow involvement by her tumor.  For that reason we changed her dose the current cycle to 125 mg every other day with cycle 2 and now to 75 mg daily which I hope will be more tolerable  We are following her CA 27-29 Lab Results  Component Value Date   CA2729 127.7 (H) 08/02/2020   CA2729 163.5 (H) 07/05/2020   CA2729 181.4 (H) 06/20/2020   CA2729 177.2  (H) 06/12/2020   CA2729 155.5 (H) 05/11/2020   She notes she is scheduled for colonoscopy on 09/03/2020.    REVIEW OF SYSTEMS: [name] feels "terrific".  She is working from home, full-time, and is very busy.  She has had no intercurrent fevers, rash, bleeding, cough, phlegm production, or pleurisy.  Her vision in the left eye is "okay", although she does still have some swelling.  She does not have double vision or blurred vision.  She does not have unusual headaches, nausea, vomiting, problems with balance, and has had no falls.  A detailed review of systems was otherwise stable.   COVID 19 VACCINATION STATUS: Not vaccinated as of 05/24/2020   BREAST CANCER HISTORY:  From the original intake note:  [name] had bilateral diagnostic mammography [facility] in [place] 11/30/2009 showing a 1.5 cm mass at the 9:00 position of the left breast with some satellites. A biopsy of the mass in question [facility] showed ([id number]) and invasive lobular carcinoma, which was estrogen and progesterone receptor both 100% positive, both with strong staining intensity, but HER-2 negative at 1+. Bilateral breast MRI 12/13/2009 in [place] showed in the left breast an irregularly marginated mass measuring 3.2 cm. There were 4 or 5 separate satellite lesions laterally and superior to the primary.  Accordingly, after appropriate discussion, the patient underwent bilateral mastectomies with left sentinel lymph node sampling 01/02/2010. The right breast was benign. The left breast showed a 2.3 cm invasive lobular carcinoma, grade 3, with a total of 6 sentinel lymph nodes removed, all negative, although 2 showed isolated tumor cells by immunostaining. Margins were negative.  An Oncotype BX was sent, with a recurrence score of  22, predicting a risk of distant recurrence within 10 years with 14% of the patients only systemic treatment was tamoxifen. The patient was treated with CMF  chemotherapy between 02/22/2010 and 07/05/2010, receiving 7 cycles at which point the patient refused further chemotherapy though there have been no major toxicities at according to the oncology note from Dr. Brent General. The patient was then started on tamoxifen 08/09/2010. By her cancer took approximately 12-15 months then stopped because of hot flashes and insomnia problems.  More recently, the patient noted a change in her left axilla andunderwent bilateral breast MRI January to 2015 at [place]. The patient has bilateral submuscular silicone implants in place. The breast were unremarkable, but there were several lymph nodes with cortical thickening in the left axilla including a dominant 1 measuring 1.2 cm in the short axis. Ultrasonography of this area identified the lymph node in question and the patient underwent biopsy of the left axillary lymph node This showed ([id number]) near-total replacement of the lymph node by metastatic carcinoma. This was 96% estrogen receptor positive, and 84% progesterone receptor positive, both with strong staining intensity. The MIB-1 was 20%. HER-2 determination is pending.  The patient's subsequent history is as detailed below   PAST MEDICAL HISTORY: Past Medical History:  Diagnosis Date  . Back muscle spasm    occasional tx with skelaxin  . Cancer ([place])    breast ca  . GERD (gastroesophageal reflux disease)   . Malignant neoplasm of breast (female), unspecified site   . Radiation 10/24/13-12/09/13   Left chestwall/supraclav./scar    PAST SURGICAL HISTORY: Past Surgical History:  Procedure Laterality Date  . AXILLARY LYMPH NODE DISSECTION Left 06/16/2013   Procedure: LEFT AXILLARY NODE DISSECTION;  Surgeon: Shann Medal, MD;  Location: [facility];  Service: General;  Laterality: Left;  . BILATERAL TOTAL MASTECTOMY WITH AXILLARY LYMPH NODE DISSECTION    . BREAST IMPLANT REMOVAL Bilateral 03/18/2018   Procedure: REMOVAL BREAST IMPLANTS;  Surgeon:  Wallace Going, DO;  Location: [facility];  Service: Plastics;  Laterality: Bilateral;  . BREAST SURGERY    . CAPSULECTOMY Bilateral 03/18/2018   Procedure: CAPSULECTOMY;  Surgeon: Wallace Going, DO;  Location: [facility];  Service: Plastics;  Laterality: Bilateral;  . LAPAROSCOPIC BILATERAL SALPINGO OOPHERECTOMY Bilateral 01/25/2014   Procedure: LAPAROSCOPIC BILATERAL SALPINGO OOPHORECTOMY;  Surgeon: Eldred Manges, MD;  Location: [facility];  Service: Gynecology;  Laterality: Bilateral;  . LEEP    . PLACEMENT OF BREAST IMPLANTS Bilateral 03/18/2018   Procedure: PLACEMENT OF BREAST IMPLANTS;  Surgeon: Wallace Going, DO;  Location: [facility];  Service: Plastics;  Laterality: Bilateral;  . PORT-A-CATH REMOVAL N/A 01/25/2014   Procedure: REMOVAL PORT-A-CATH;  Surgeon: Alphonsa Overall, MD;  Location: [facility];  Service: General;  Laterality: N/A;  . PORTACATH PLACEMENT N/A 06/16/2013   Procedure: POWER PORT PLACEMENT;  Surgeon: Shann Medal, MD;  Location: [facility];  Service: General;  Laterality: N/A;  . right leg surgery     broken femur - has rod in leg  . TONSILLECTOMY    . WISDOM TOOTH EXTRACTION      FAMILY HISTORY Family History  Problem Relation Age of Onset  . Other Mother 49       glioblastoma; deceased 42  . Cancer Mother   . Other Father 61       glioblastoma; deceased 65  . Cancer Father   . Hypertension Other    according to the patient  both her parents died from a primary brain cancers, namely we have the stomas, her father at 55, her mother at 14. The patient had one brother and one sister. There is no history of breast or ovarian cancer in the family   GYNECOLOGIC HISTORY:   (Reviewed 10/10/2013) Menarche age 64. The patient has never carried a child to term. She was still having regular periods at the time of her recent diagnosis. LMP 07/18/2013 as of 09/08/2013.  The patient took oral contraceptives between the  ages of 1 and 2 with no complications. She stopped having periods with her chemotherapy in 2015.    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 She currently works from [employer] for [employer]Marland Kitchen  Her husband Burnice Logan works at [employer] and also in [place]: In the absence of any documents to the contrary the patient's husband is her healthcare power of attorney   HEALTH MAINTENANCE:  Social History   Tobacco Use  . Smoking status: Never Smoker  . Smokeless tobacco: Never Used  Substance Use Topics  . Alcohol use: Yes    Comment: socially wine  . Drug use: No     Colonoscopy: Never  PAP: December 2014  Bone density: Never  Lipid panel: Not on file   Allergies  Allergen Reactions  . Sulfa Antibiotics Hives and Itching  . Gadolinium Derivatives Hives    After gado injection, pt had a few small hives on left breast area. Treated with benadryl by dr Janeece Fitting   . Petrolatum Hives and Rash    Current Outpatient Medications  Medication Sig Dispense Refill  . anastrozole (ARIMIDEX) 1 MG tablet Take 1 tablet (1 mg total) by mouth daily. 90 tablet 4  . palbociclib (IBRANCE) 75 MG tablet Take 1 tablet (75 mg total) by mouth daily. Take for 21 days on, 7 days off, repeat every 28 days. Start Thursday July 26, 2020 21 tablet 6   No current facility-administered medications for this visit.    OBJECTIVE: [name] who appears stated age  45:   08/30/20 1308  BP: 104/74  Pulse: 71  Resp: 18  Temp: (!) 97.5 F (36.4 C)  SpO2: 100%   Wt Readings from Last 3 Encounters:  08/30/20 197 lb 6.4 oz (89.5 kg)  08/02/20 190 lb 14.4 oz (86.6 kg)  07/05/20 187 lb 14.4 oz (85.2 kg)   Body mass index is 30.01 kg/m.    ECOG FS:1 - Symptomatic but completely  ambulatory  Sclerae unicteric, EOMs intact Wearing a mask No cervical or supraclavicular adenopathy Lungs no rales or rhonchi Heart regular rate and rhythm Abd soft, nontender, positive bowel sounds MSK no focal spinal tenderness, no upper extremity lymphedema Neuro: nonfocal, well oriented, appropriate affect Breasts: Status post bilateral mastectomies with bilateral reconstruction.  I do not see evidence of chest wall recurrence.  Both axillae are benign   LAB RESULTS: Lab Results  Component Value Date   WBC 2.1 (L) 08/30/2020   NEUTROABS 0.7 (L) 08/30/2020   HGB 9.7 (L) 08/30/2020   HCT 30.4 (L) 08/30/2020   MCV 94.1 08/30/2020   PLT 250 08/30/2020      Chemistry      Component Value Date/Time   NA 141 08/30/2020 1243   NA 141 10/30/2016 0813   K 3.9 08/30/2020  1243   K 4.0 10/30/2016 0813   CL 110 08/30/2020 1243   CO2 24 08/30/2020 1243   CO2 26 10/30/2016 0813   BUN 14 08/30/2020 1243   BUN 15.2 10/30/2016 0813   CREATININE 0.75 08/30/2020 1243   CREATININE 0.8 10/30/2016 0813      Component Value Date/Time   CALCIUM 8.6 (L) 08/30/2020 1243   CALCIUM 10.0 10/30/2016 0813   ALKPHOS 118 08/30/2020 1243   ALKPHOS 59 10/30/2016 0813   AST 24 08/30/2020 1243   AST 17 10/30/2016 0813   ALT 16 08/30/2020 1243   ALT 11 10/30/2016 0813   BILITOT 0.2 (L) 08/30/2020 1243   BILITOT 0.26 10/30/2016 0813      STUDIES: CT Chest W Contrast  Result Date: 08/30/2020 CLINICAL DATA:  Breast cancer, assess treatment response. Follow measurable disease. Is patient found to have stigmata of metastatic disease on previous imaging in liver, upper abdominal lymph nodes as well as in the chest. EXAM: CT CHEST, ABDOMEN, AND PELVIS WITH CONTRAST TECHNIQUE: Multidetector CT imaging of the chest, abdomen and pelvis was performed following the standard protocol during bolus administration of intravenous contrast. CONTRAST:  148m OMNIPAQUE IOHEXOL 300 MG/ML  SOLN COMPARISON:  May 10, 2020 FINDINGS: CT CHEST FINDINGS Cardiovascular: Heart size normal. No pericardial effusion. 3.2 cm central pulmonary vascular caliber of main pulmonary artery. Aorta is normal caliber. Mediastinum/Nodes: RIGHT axillary lymph nodes most with interval decrease in size. (Image 20/2) 6 mm RIGHT axillary lymph node previously 15 mm short axis. Decrease in size to a similar extent of subpectoral lymph nodes tracking towards RIGHT supraclavicular region on the prior study. Largest remaining lymph node is the RIGHT axillary lymph node measuring 11 mm along the margin of the RIGHT pectoralis muscle previously 9 mm. No supraclavicular adenopathy. Post LEFT axillary dissection. Soft tissue along the RIGHT paratracheal chain (image 19/2) 11 mm thickness previously 13 mm. Subcarinal nodal tissue (image 27/2) may be slightly diminished at 5 mm as compared to 7 mm greatest thickness. RIGHT infrahilar nodal tissue (image 32/2) 7 mm as compared to 11 mm short axis measurement. Para-aortic/juxta esophageal lymph node on image 45/2 at 9 mm, previously 13 mm. No internal mammary lymphadenopathy. Lungs/Pleura: No suspicious pulmonary nodule. No effusion. No consolidation. Airways are patent. Musculoskeletal: See below for full musculoskeletal detail. CT ABDOMEN PELVIS FINDINGS Hepatobiliary: Improvement with respect to hepatic metastatic disease. RIGHT hepatic lobe lesion image 47/2, 6.1 x 3.6 cm as compared to the 6.1 x 4.0 cm when measured near the dome of the RIGHT hemi liver. When measured slightly inferiorly this area is approximately 4.4 x 3.3 cm as compared to 6.8 x 5.1 cm. There is some irregularity along the capsular margin along the RIGHT hemidiaphragm not seen on the prior study though the tumor did extend to the margin of the liver and distort the liver capsule, this measures approximately 0.8 x 1.6 cm (image 49/2.) Subtle area near the hepatic venous confluence (image 45/2) 9 mm previously 14 mm. Other numerous foci of low  attenuation with similar improvement with the exception of the lesion along hepatic subsegment VI which measures 11 mm (image 70/2) this is stable. Portal vein and hepatic veins are patent. Cholelithiasis. Pancreas: Normal, without mass, inflammation or ductal dilatation. Spleen: Normal spleen. Adrenals/Urinary Tract: Adrenal glands are normal. Symmetric renal enhancement. Small cyst in the upper pole the LEFT kidney. No hydronephrosis. Smooth contour of the urinary bladder. Stomach/Bowel: Mildly patulous distal esophagus. Stomach is unremarkable. Small bowel is  nondilated and without signs of adjacent stranding. Ingested contrast media passing into the colon. The appendix is normal. Vascular/Lymphatic: Normal caliber abdominal aorta. Smooth contour of the IVC. Bulky adenopathy about the celiac as diminished in size. (Image 58/2 short axis measurement of lymph node anterior to common hepatic artery 15 mm as compared to 28 mm. Periportal lymph node extending to the porta hepatis on image 55/2 measures 2.2 x 2.3 cm as compared to the 2.7 x 4.5 cm. No new enlarged lymph nodes in this area. Reproductive: Lobular uterine contour presumably due to leiomyomata. No adnexal masses. No pelvic lymphadenopathy. Top-normal size lymph nodes on today's study have diminished slightly since previous study. RIGHT external iliac lymph node on image 103/2 at 10 mm short axis previously approximately 12 mm. No pelvic ascites. Other: No body wall lesion.  No ascites or peritoneal nodularity. Musculoskeletal: Diffuse skeletal metastatic disease with sclerosis unchanged. Signs of RIGHT femoral ORIF partially imaged. IMPRESSION: 1. Decreasing size of nodal and visceral metastases. Borderline enlarged lymph node in the RIGHT axilla and perihepatic irregularity warranting attention on follow-up. 2. Borderline pelvic nodal enlargement also warrants attention on follow-up but is decreased in size. 3. Diffuse skeletal metastatic disease with  sclerosis is unchanged. 4. Cholelithiasis. Electronically Signed   By: Zetta Bills M.D.   On: 08/30/2020 10:40   CT Abdomen Pelvis W Contrast  Result Date: 08/30/2020 CLINICAL DATA:  Breast cancer, assess treatment response. Follow measurable disease. Is patient found to have stigmata of metastatic disease on previous imaging in liver, upper abdominal lymph nodes as well as in the chest. EXAM: CT CHEST, ABDOMEN, AND PELVIS WITH CONTRAST TECHNIQUE: Multidetector CT imaging of the chest, abdomen and pelvis was performed following the standard protocol during bolus administration of intravenous contrast. CONTRAST:  188m OMNIPAQUE IOHEXOL 300 MG/ML  SOLN COMPARISON:  May 10, 2020 FINDINGS: CT CHEST FINDINGS Cardiovascular: Heart size normal. No pericardial effusion. 3.2 cm central pulmonary vascular caliber of main pulmonary artery. Aorta is normal caliber. Mediastinum/Nodes: RIGHT axillary lymph nodes most with interval decrease in size. (Image 20/2) 6 mm RIGHT axillary lymph node previously 15 mm short axis. Decrease in size to a similar extent of subpectoral lymph nodes tracking towards RIGHT supraclavicular region on the prior study. Largest remaining lymph node is the RIGHT axillary lymph node measuring 11 mm along the margin of the RIGHT pectoralis muscle previously 9 mm. No supraclavicular adenopathy. Post LEFT axillary dissection. Soft tissue along the RIGHT paratracheal chain (image 19/2) 11 mm thickness previously 13 mm. Subcarinal nodal tissue (image 27/2) may be slightly diminished at 5 mm as compared to 7 mm greatest thickness. RIGHT infrahilar nodal tissue (image 32/2) 7 mm as compared to 11 mm short axis measurement. Para-aortic/juxta esophageal lymph node on image 45/2 at 9 mm, previously 13 mm. No internal mammary lymphadenopathy. Lungs/Pleura: No suspicious pulmonary nodule. No effusion. No consolidation. Airways are patent. Musculoskeletal: See below for full musculoskeletal detail. CT  ABDOMEN PELVIS FINDINGS Hepatobiliary: Improvement with respect to hepatic metastatic disease. RIGHT hepatic lobe lesion image 47/2, 6.1 x 3.6 cm as compared to the 6.1 x 4.0 cm when measured near the dome of the RIGHT hemi liver. When measured slightly inferiorly this area is approximately 4.4 x 3.3 cm as compared to 6.8 x 5.1 cm. There is some irregularity along the capsular margin along the RIGHT hemidiaphragm not seen on the prior study though the tumor did extend to the margin of the liver and distort the liver capsule, this measures approximately  0.8 x 1.6 cm (image 49/2.) Subtle area near the hepatic venous confluence (image 45/2) 9 mm previously 14 mm. Other numerous foci of low attenuation with similar improvement with the exception of the lesion along hepatic subsegment VI which measures 11 mm (image 70/2) this is stable. Portal vein and hepatic veins are patent. Cholelithiasis. Pancreas: Normal, without mass, inflammation or ductal dilatation. Spleen: Normal spleen. Adrenals/Urinary Tract: Adrenal glands are normal. Symmetric renal enhancement. Small cyst in the upper pole the LEFT kidney. No hydronephrosis. Smooth contour of the urinary bladder. Stomach/Bowel: Mildly patulous distal esophagus. Stomach is unremarkable. Small bowel is nondilated and without signs of adjacent stranding. Ingested contrast media passing into the colon. The appendix is normal. Vascular/Lymphatic: Normal caliber abdominal aorta. Smooth contour of the IVC. Bulky adenopathy about the celiac as diminished in size. (Image 58/2 short axis measurement of lymph node anterior to common hepatic artery 15 mm as compared to 28 mm. Periportal lymph node extending to the porta hepatis on image 55/2 measures 2.2 x 2.3 cm as compared to the 2.7 x 4.5 cm. No new enlarged lymph nodes in this area. Reproductive: Lobular uterine contour presumably due to leiomyomata. No adnexal masses. No pelvic lymphadenopathy. Top-normal size lymph nodes on  today's study have diminished slightly since previous study. RIGHT external iliac lymph node on image 103/2 at 10 mm short axis previously approximately 12 mm. No pelvic ascites. Other: No body wall lesion.  No ascites or peritoneal nodularity. Musculoskeletal: Diffuse skeletal metastatic disease with sclerosis unchanged. Signs of RIGHT femoral ORIF partially imaged. IMPRESSION: 1. Decreasing size of nodal and visceral metastases. Borderline enlarged lymph node in the RIGHT axilla and perihepatic irregularity warranting attention on follow-up. 2. Borderline pelvic nodal enlargement also warrants attention on follow-up but is decreased in size. 3. Diffuse skeletal metastatic disease with sclerosis is unchanged. 4. Cholelithiasis. Electronically Signed   By: Zetta Bills M.D.   On: 08/30/2020 10:40     ASSESSMENT: 45 y.o. BRCA negative [name], [name]  (1) status post bilateral mastectomies with left axillary lymph node sampling [6 nodes removed] 01/02/2010 for a pT2 pN0(i+), stage IIA invasive lobular breast cancer, grade 3, estrogen and progesterone receptor positive, HER-2 not amplified  (a) right breast was benign  (2) Oncotype DX score of 22 predicted a 14% risk of distant recurrence within 10 years if the patient's only systemic treatment is tamoxifen for 5 years  (3) status post CMF(cyclophosphamide, fluorouracil, methotrexate) x7 given between October of 2011 and March of 2012 (7 of 8 planned treatments completed)  (4) tamoxifen started April 2012, discontinued approximately July of 2013 (about 15 months) because of problems with hot flashes and insomnia  (5) pathologically documented left axillary recurrence 05/05/2013, the tumor being again estrogen and progesterone receptor positive, with HER-2 not amplified, and MIB-1 of 15%. Staging CT/PET 05/27/2013 show left axillary and supraclavicular recurrence, no distant disease  (6) status post completion left axillary lymph node dissection  06/16/2013 showing a total of 10/15 lymph nodes involved by tumor, with evidence of extracapsular extension (TX N3 = stage IIIC)  (7) treated with carboplatin/ docetaxel, first dose on 07/11/2013, repeated every 21 days x 4 with Neulasta support on day 2.  Final dose was given on 09/12/2013, with chemotherapy discontinued after 4 cycles due to continuing low counts.  (8) radiation completed 12/09/2013: Left chest wall / 50.4 Gray @ 1.8 Gray per fraction x 28 fractions Left Supraclavicular fossa and PAB/ 45 Gray @1 .8 Gray per fraction x 25 fractions  Left scar / 10 Gray at [employer] per fraction x 5 fractions   (9) bilateral salpingo-oophorectomy on 01/25/14  (10)   lymphedema and limited range of motion in the left upper extremity secondary to left axillary lymph node dissection, being treated through the lymphedema clinic  - improved   (13) sequencing and deletion/duplication analysis of 17 genes performed September 2015 at [facility] showed no deleterious mutations in  ATM, BARD1, BRCA1, BRCA2, BRIP1, CDH1, CHEK2, MRE11A, MUTYH, NBN, NF1, PALB2, PTEN, RAD50, RAD51C, RAD51D, and TP53.  (14) tamoxifen re-started 02/27/2014, discontinued December 2021 with progression  (15) bilateral breast implant exchange with capsulotomies 03/18/2018 following left implant rupture and bilateral capsular contractures. Left - [employer] Smooth Round Ultra High Profile Gel 590cc. Ref #[id number].  Serial Number [id number] Right - [employer] Smooth Round Ultra High Profile Gel 590cc. Ref #[id number].  Serial Number [id number]  METASTATIC DISEASE: (16) bone marrow biopsy 04/23/2020 shows metastatic carcinoma in the marrow, estrogen and progesterone receptor positive  (a) MRI of the cervical spine 04/07/2020 shows markedly abnormal marrow signal, prominent right axillary lymph nodes, degenerative disc disease  (b) MRI of the orbits 04/13/2020 suggests metastatic disease around the left orbit and diffusely abnormal bone  marrow signal  (c) CT scan of the chest, abdomen and pelvis 05/10/2020 shows extensive adenopathy (right axillary, prevascular, right hilar, celiac and portacaval axes), multiple liver masses, multiple sclerotic bone lesions  (d) bone scan 05/10/2020 shows uptake through the pelvis spine sternum and ribs and possibly the right mandible.  Consistent with widespread bony metastatic disease  (e) brain MRI 05/09/2020 shows no evidence of intracranial metastases, with left periorbital disease as previously documented  (f) CA 27-29 is informative (was 155.5 05/11/2020)  (17) radiation to the left periorbital region completed 05/31/2020  (18) anastrozole started 05/01/2020  (a) palbociclib started 05/11/2020  (b) dose reduced with second cycle to 75 mg daily due to cytopenias  (c) CT of the abdomen and pelvis 08/29/2020 shows evidence of response  (19) denosumab/ Xgeva started 07/05/2020, repeated every 28 d   PLAN: [name] is now 5 months out from definitive diagnosis of metastatic breast cancer.  She is tolerating anastrozole and palbociclib well clinically.  Restaging scan of the chest shows evidence of response.  She continues to have significant neutropenia.  Of course palbociclib can cause this problem but in her case I think the real reason is bone marrow infiltration.  If we substituted a different psych Lib it probably would make little difference.  Instead I think it is better to continue palbociclib, which we know is working for her, and see if we can make a little room in the marrow for her normal white cells to grow.  She is aware of the need to call us if she develops a temperature greater than 100 or any unusual symptoms.  She does still have some swelling over the left area where she received radiation.  I am going to obtain a repeat brain MRI just to make sure we are not missing something.  Otherwise we are continuing treatment as before.  She will receive denosumab/Xgeva today and  every 28days.  She knows to call for any other issue that may develop before the next visit  Total encounter time 25 minutes.*   Tulip Meharg, Virgie Dad, MD  08/30/20 6:19 PM Medical Oncology and Hematology [facility] [place], [place] [address] Tel. [phone]    Fax. [phone]   IWilburn Mylar, am acting  as scribe for Dr. Sarajane Jews C. Nichlos Kunzler.  I, Lurline Del MD, have reviewed the above documentation for accuracy and completeness, and I agree with the above.   *Total Encounter Time as defined by the Centers for Medicare and Medicaid Services includes, in addition to the face-to-face time of a patient visit (documented in the note above) non-face-to-face time: obtaining and reviewing outside history, ordering and reviewing medications, tests or procedures, care coordination (communications with other health care professionals or caregivers) and documentation in the medical record.

## 2020-08-30 ENCOUNTER — Other Ambulatory Visit: Payer: BC Managed Care – PPO

## 2020-08-30 ENCOUNTER — Inpatient Hospital Stay: Payer: BC Managed Care – PPO

## 2020-08-30 ENCOUNTER — Ambulatory Visit: Payer: BC Managed Care – PPO

## 2020-08-30 ENCOUNTER — Inpatient Hospital Stay: Payer: BC Managed Care – PPO | Attending: Oncology

## 2020-08-30 ENCOUNTER — Other Ambulatory Visit: Payer: Self-pay

## 2020-08-30 ENCOUNTER — Inpatient Hospital Stay (HOSPITAL_BASED_OUTPATIENT_CLINIC_OR_DEPARTMENT_OTHER): Payer: BC Managed Care – PPO | Admitting: Oncology

## 2020-08-30 VITALS — BP 110/79

## 2020-08-30 VITALS — BP 104/74 | HR 71 | Temp 97.5°F | Resp 18 | Wt 197.4 lb

## 2020-08-30 DIAGNOSIS — C7951 Secondary malignant neoplasm of bone: Secondary | ICD-10-CM

## 2020-08-30 DIAGNOSIS — C50919 Malignant neoplasm of unspecified site of unspecified female breast: Secondary | ICD-10-CM

## 2020-08-30 DIAGNOSIS — C787 Secondary malignant neoplasm of liver and intrahepatic bile duct: Secondary | ICD-10-CM

## 2020-08-30 DIAGNOSIS — C50911 Malignant neoplasm of unspecified site of right female breast: Secondary | ICD-10-CM

## 2020-08-30 DIAGNOSIS — C50812 Malignant neoplasm of overlapping sites of left female breast: Secondary | ICD-10-CM | POA: Insufficient documentation

## 2020-08-30 DIAGNOSIS — C50912 Malignant neoplasm of unspecified site of left female breast: Secondary | ICD-10-CM

## 2020-08-30 DIAGNOSIS — Z170 Estrogen receptor positive status [ER+]: Secondary | ICD-10-CM

## 2020-08-30 DIAGNOSIS — C771 Secondary and unspecified malignant neoplasm of intrathoracic lymph nodes: Secondary | ICD-10-CM

## 2020-08-30 LAB — SAMPLE TO BLOOD BANK

## 2020-08-30 LAB — CMP (CANCER CENTER ONLY)
ALT: 16 U/L (ref 0–44)
AST: 24 U/L (ref 15–41)
Albumin: 3.9 g/dL (ref 3.5–5.0)
Alkaline Phosphatase: 118 U/L (ref 38–126)
Anion gap: 7 (ref 5–15)
BUN: 14 mg/dL (ref 6–20)
CO2: 24 mmol/L (ref 22–32)
Calcium: 8.6 mg/dL — ABNORMAL LOW (ref 8.9–10.3)
Chloride: 110 mmol/L (ref 98–111)
Creatinine: 0.75 mg/dL (ref 0.44–1.00)
GFR, Estimated: >60 mL/min (ref >60)
Glucose, Bld: 82 mg/dL (ref 70–99)
Potassium: 3.9 mmol/L (ref 3.5–5.1)
Sodium: 141 mmol/L (ref 135–145)
Total Bilirubin: 0.2 mg/dL — ABNORMAL LOW (ref 0.3–1.2)
Total Protein: 7.6 g/dL (ref 6.5–8.1)

## 2020-08-30 LAB — CBC WITH DIFFERENTIAL (CANCER CENTER ONLY)
Abs Immature Granulocytes: 0.01 K/uL (ref 0.00–0.07)
Basophils Absolute: 0 K/uL (ref 0.0–0.1)
Basophils Relative: 1 %
Eosinophils Absolute: 0 K/uL (ref 0.0–0.5)
Eosinophils Relative: 1 %
HCT: 30.4 % — ABNORMAL LOW (ref 36.0–46.0)
Hemoglobin: 9.7 g/dL — ABNORMAL LOW (ref 12.0–15.0)
Immature Granulocytes: 1 %
Lymphocytes Relative: 60 %
Lymphs Abs: 1.3 K/uL (ref 0.7–4.0)
MCH: 30 pg (ref 26.0–34.0)
MCHC: 31.9 g/dL (ref 30.0–36.0)
MCV: 94.1 fL (ref 80.0–100.0)
Monocytes Absolute: 0.1 K/uL (ref 0.1–1.0)
Monocytes Relative: 5 %
Neutro Abs: 0.7 K/uL — ABNORMAL LOW (ref 1.7–7.7)
Neutrophils Relative %: 32 %
Platelet Count: 250 K/uL (ref 150–400)
RBC: 3.23 MIL/uL — ABNORMAL LOW (ref 3.87–5.11)
RDW: 17.9 % — ABNORMAL HIGH (ref 11.5–15.5)
WBC Count: 2.1 K/uL — ABNORMAL LOW (ref 4.0–10.5)
nRBC: 0 % (ref 0.0–0.2)

## 2020-08-30 MED ORDER — DENOSUMAB 120 MG/1.7ML ~~LOC~~ SOLN
SUBCUTANEOUS | Status: AC
  Filled 2020-08-30: qty 1.7

## 2020-08-30 MED ORDER — DENOSUMAB 120 MG/1.7ML ~~LOC~~ SOLN
120.0000 mg | Freq: Once | SUBCUTANEOUS | Status: AC
  Administered 2020-08-30: 120 mg via SUBCUTANEOUS

## 2020-08-31 LAB — CANCER ANTIGEN 27.29: CA 27.29: 72.8 U/mL — ABNORMAL HIGH (ref 0.0–38.6)

## 2020-09-03 ENCOUNTER — Other Ambulatory Visit: Payer: Self-pay | Admitting: Oncology

## 2020-09-03 ENCOUNTER — Telehealth: Payer: Self-pay | Admitting: Oncology

## 2020-09-03 DIAGNOSIS — Z170 Estrogen receptor positive status [ER+]: Secondary | ICD-10-CM

## 2020-09-03 NOTE — Telephone Encounter (Signed)
Scheduled appt per 5/9 sch msg. Pt aware.  

## 2020-09-06 ENCOUNTER — Encounter: Payer: BC Managed Care – PPO | Admitting: Adult Health

## 2020-09-14 DIAGNOSIS — Z1211 Encounter for screening for malignant neoplasm of colon: Secondary | ICD-10-CM | POA: Diagnosis not present

## 2020-09-17 ENCOUNTER — Ambulatory Visit
Admission: RE | Admit: 2020-09-17 | Discharge: 2020-09-17 | Disposition: A | Discharge status: Home / Self Care | Payer: BC Managed Care – PPO | Source: Ambulatory Visit | Attending: Radiation Oncology | Admitting: Radiation Oncology

## 2020-09-17 DIAGNOSIS — C50911 Malignant neoplasm of unspecified site of right female breast: Secondary | ICD-10-CM

## 2020-09-17 NOTE — Progress Notes (Signed)
  Radiation Oncology         [phone] ________________________________  Name: [name] MRN: [id number]  Date of Service: 09/17/2020  DOB: [birth year]  Post Treatment Telephone Note  Diagnosis:   Metastatic Breast Cancer  Interval Since Last Radiation:  16 weeks   05/11/2020 through 05/30/2020 Site Technique Total Dose (Gy) Dose per Fx (Gy) Completed Fx Beam Energies  Eye, Left: HN_Lt 3D 35/35 2.5 14/14 6X     Narrative:  The patient was contacted today for routine follow-up. During treatment she did very well with radiotherapy and did not have significant desquamation. She reports she has almost complete resolution of her loss of vision and is so happy she is seeing things at baseline. She reports that at night time she does have facial swelling and that this goes down and is gone in the mornings.  Impression/Plan: 1. Metastatic Breast Cancer. The patient has been doing well since completion of radiotherapy. We discussed that we would be happy to continue to follow her as needed, but she will also continue to follow up with Dr. Jana Hakim in medical oncology. She is agreeable to an evaluation with PT for assessment as it sounds like she has some lymphedema as a result of her prior radiation treatments.       Carola Rhine, PAC

## 2020-09-27 ENCOUNTER — Ambulatory Visit: Payer: BC Managed Care – PPO | Admitting: Adult Health

## 2020-09-27 ENCOUNTER — Inpatient Hospital Stay (HOSPITAL_BASED_OUTPATIENT_CLINIC_OR_DEPARTMENT_OTHER): Payer: BC Managed Care – PPO | Admitting: Hematology

## 2020-09-27 ENCOUNTER — Inpatient Hospital Stay: Payer: BC Managed Care – PPO | Attending: Oncology

## 2020-09-27 ENCOUNTER — Encounter: Payer: Self-pay | Admitting: Hematology

## 2020-09-27 ENCOUNTER — Other Ambulatory Visit: Payer: Self-pay

## 2020-09-27 ENCOUNTER — Inpatient Hospital Stay: Payer: BC Managed Care – PPO

## 2020-09-27 VITALS — BP 132/83 | HR 82 | Temp 98.7°F | Resp 18

## 2020-09-27 VITALS — BP 129/83 | HR 81 | Temp 97.5°F | Resp 17 | Ht 68.0 in | Wt 202.8 lb

## 2020-09-27 DIAGNOSIS — C7951 Secondary malignant neoplasm of bone: Secondary | ICD-10-CM

## 2020-09-27 DIAGNOSIS — C771 Secondary and unspecified malignant neoplasm of intrathoracic lymph nodes: Secondary | ICD-10-CM | POA: Diagnosis not present

## 2020-09-27 DIAGNOSIS — C50812 Malignant neoplasm of overlapping sites of left female breast: Secondary | ICD-10-CM

## 2020-09-27 DIAGNOSIS — C773 Secondary and unspecified malignant neoplasm of axilla and upper limb lymph nodes: Secondary | ICD-10-CM

## 2020-09-27 DIAGNOSIS — C50911 Malignant neoplasm of unspecified site of right female breast: Secondary | ICD-10-CM

## 2020-09-27 DIAGNOSIS — Z79899 Other long term (current) drug therapy: Secondary | ICD-10-CM | POA: Insufficient documentation

## 2020-09-27 DIAGNOSIS — Z170 Estrogen receptor positive status [ER+]: Secondary | ICD-10-CM

## 2020-09-27 DIAGNOSIS — C50919 Malignant neoplasm of unspecified site of unspecified female breast: Secondary | ICD-10-CM

## 2020-09-27 DIAGNOSIS — C50912 Malignant neoplasm of unspecified site of left female breast: Secondary | ICD-10-CM

## 2020-09-27 LAB — CMP (CANCER CENTER ONLY)
ALT: 10 U/L (ref 0–44)
AST: 17 U/L (ref 15–41)
Albumin: 3.8 g/dL (ref 3.5–5.0)
Alkaline Phosphatase: 134 U/L — ABNORMAL HIGH (ref 38–126)
Anion gap: 9 (ref 5–15)
BUN: 8 mg/dL (ref 6–20)
CO2: 21 mmol/L — ABNORMAL LOW (ref 22–32)
Calcium: 7.9 mg/dL — ABNORMAL LOW (ref 8.9–10.3)
Chloride: 112 mmol/L — ABNORMAL HIGH (ref 98–111)
Creatinine: 0.72 mg/dL (ref 0.44–1.00)
GFR, Estimated: >60 mL/min (ref >60)
Glucose, Bld: 76 mg/dL (ref 70–99)
Potassium: 3.9 mmol/L (ref 3.5–5.1)
Sodium: 142 mmol/L (ref 135–145)
Total Bilirubin: 0.3 mg/dL (ref 0.3–1.2)
Total Protein: 7.4 g/dL (ref 6.5–8.1)

## 2020-09-27 LAB — CBC WITH DIFFERENTIAL (CANCER CENTER ONLY)
Abs Immature Granulocytes: 0 10*3/uL (ref 0.00–0.07)
Basophils Absolute: 0 10*3/uL (ref 0.0–0.1)
Basophils Relative: 1 %
Eosinophils Absolute: 0 10*3/uL (ref 0.0–0.5)
Eosinophils Relative: 1 %
HCT: 32.9 % — ABNORMAL LOW (ref 36.0–46.0)
Hemoglobin: 10.3 g/dL — ABNORMAL LOW (ref 12.0–15.0)
Immature Granulocytes: 0 %
Lymphocytes Relative: 58 %
Lymphs Abs: 1.2 10*3/uL (ref 0.7–4.0)
MCH: 29.4 pg (ref 26.0–34.0)
MCHC: 31.3 g/dL (ref 30.0–36.0)
MCV: 94 fL (ref 80.0–100.0)
Monocytes Absolute: 0.2 10*3/uL (ref 0.1–1.0)
Monocytes Relative: 8 %
Neutro Abs: 0.7 10*3/uL — ABNORMAL LOW (ref 1.7–7.7)
Neutrophils Relative %: 32 %
Platelet Count: 224 10*3/uL (ref 150–400)
RBC: 3.5 MIL/uL — ABNORMAL LOW (ref 3.87–5.11)
RDW: 15.7 % — ABNORMAL HIGH (ref 11.5–15.5)
WBC Count: 2.1 10*3/uL — ABNORMAL LOW (ref 4.0–10.5)
nRBC: 0 % (ref 0.0–0.2)

## 2020-09-27 LAB — MAGNESIUM: Magnesium: 2.3 mg/dL (ref 1.7–2.4)

## 2020-09-27 LAB — SAMPLE TO BLOOD BANK

## 2020-09-27 MED ORDER — DENOSUMAB 120 MG/1.7ML ~~LOC~~ SOLN
SUBCUTANEOUS | Status: AC
  Filled 2020-09-27: qty 1.7

## 2020-09-27 MED ORDER — DENOSUMAB 120 MG/1.7ML ~~LOC~~ SOLN
120.0000 mg | Freq: Once | SUBCUTANEOUS | Status: AC
  Administered 2020-09-27: 120 mg via SUBCUTANEOUS

## 2020-09-27 NOTE — Patient Instructions (Signed)
Denosumab injection What is this medicine? DENOSUMAB (den oh sue mab) slows bone breakdown. Prolia is used to treat osteoporosis in women after menopause and in men, and in people who are taking corticosteroids for 6 months or more. Xgeva is used to treat a high calcium level due to cancer and to prevent bone fractures and other bone problems caused by multiple myeloma or cancer bone metastases. Xgeva is also used to treat giant cell tumor of the bone. This medicine may be used for other purposes; ask your health care provider or pharmacist if you have questions. COMMON BRAND NAME(S): Prolia, XGEVA What should I tell my health care provider before I take this medicine? They need to know if you have any of these conditions:  dental disease  having surgery or tooth extraction  infection  kidney disease  low levels of calcium or Vitamin D in the blood  malnutrition  on hemodialysis  skin conditions or sensitivity  thyroid or parathyroid disease  an unusual reaction to denosumab, other medicines, foods, dyes, or preservatives  pregnant or trying to get pregnant  breast-feeding How should I use this medicine? This medicine is for injection under the skin. It is given by a health care professional in a hospital or clinic setting. A special MedGuide will be given to you before each treatment. Be sure to read this information carefully each time. For Prolia, talk to your pediatrician regarding the use of this medicine in children. Special care may be needed. For Xgeva, talk to your pediatrician regarding the use of this medicine in children. While this drug may be prescribed for children as young as 13 years for selected conditions, precautions do apply. Overdosage: If you think you have taken too much of this medicine contact a poison control center or emergency room at once. NOTE: This medicine is only for you. Do not share this medicine with others. What if I miss a dose? It is  important not to miss your dose. Call your doctor or health care professional if you are unable to keep an appointment. What may interact with this medicine? Do not take this medicine with any of the following medications:  other medicines containing denosumab This medicine may also interact with the following medications:  medicines that lower your chance of fighting infection  steroid medicines like prednisone or cortisone This list may not describe all possible interactions. Give your health care provider a list of all the medicines, herbs, non-prescription drugs, or dietary supplements you use. Also tell them if you smoke, drink alcohol, or use illegal drugs. Some items may interact with your medicine. What should I watch for while using this medicine? Visit your doctor or health care professional for regular checks on your progress. Your doctor or health care professional may order blood tests and other tests to see how you are doing. Call your doctor or health care professional for advice if you get a fever, chills or sore throat, or other symptoms of a cold or flu. Do not treat yourself. This drug may decrease your body's ability to fight infection. Try to avoid being around people who are sick. You should make sure you get enough calcium and vitamin D while you are taking this medicine, unless your doctor tells you not to. Discuss the foods you eat and the vitamins you take with your health care professional. See your dentist regularly. Brush and floss your teeth as directed. Before you have any dental work done, tell your dentist you are   receiving this medicine. Do not become pregnant while taking this medicine or for 5 months after stopping it. Talk with your doctor or health care professional about your birth control options while taking this medicine. Women should inform their doctor if they wish to become pregnant or think they might be pregnant. There is a potential for serious side  effects to an unborn child. Talk to your health care professional or pharmacist for more information. What side effects may I notice from receiving this medicine? Side effects that you should report to your doctor or health care professional as soon as possible:  allergic reactions like skin rash, itching or hives, swelling of the face, lips, or tongue  bone pain  breathing problems  dizziness  jaw pain, especially after dental work  redness, blistering, peeling of the skin  signs and symptoms of infection like fever or chills; cough; sore throat; pain or trouble passing urine  signs of low calcium like fast heartbeat, muscle cramps or muscle pain; pain, tingling, numbness in the hands or feet; seizures  unusual bleeding or bruising  unusually weak or tired Side effects that usually do not require medical attention (report to your doctor or health care professional if they continue or are bothersome):  constipation  diarrhea  headache  joint pain  loss of appetite  muscle pain  runny nose  tiredness  upset stomach This list may not describe all possible side effects. Call your doctor for medical advice about side effects. You may report side effects to FDA at 1-800-FDA-1088. Where should I keep my medicine? This medicine is only given in a clinic, doctor's office, or other health care setting and will not be stored at home. NOTE: This sheet is a summary. It may not cover all possible information. If you have questions about this medicine, talk to your doctor, pharmacist, or health care provider.  2021 Elsevier/Gold Standard (2017-08-21 16:10:44)

## 2020-09-27 NOTE — Progress Notes (Signed)
Ok to proceed w/ Delton See today w/ Corr Ca = 8.06 per Dr. Lindi Adie Pt will take Tums  Kennith Center, Pharm.D., CPP 09/27/2020@2 :26 PM

## 2020-09-27 NOTE — Progress Notes (Signed)
[facility]   [phone] [phone]   Clinic Follow up Note   Patient Care Team: Magrinat, Virgie Dad, MD as PCP - General (Oncology) Magrinat, Virgie Dad, MD as Consulting Physician (Oncology) Alphonsa Overall, MD as Consulting Physician (General Surgery) Gavin Pound, MD as Consulting Physician (Rheumatology) Katy Fitch, Darlina Guys, MD as Consulting Physician (Ophthalmology) Kyung Rudd, MD as Consulting Physician (Radiation Oncology)  Date of Service:  09/27/2020  CHIEF COMPLAINT: f/u of stage IV breast cancer  I connected with [name] on 09/27/2020 at  3:00 PM EDT by telephone visit and verified that I am speaking with the correct person using two identifiers.  I discussed the limitations, risks, security and privacy concerns of performing an evaluation and management service by telephone and the availability of in person appointments. I also discussed with the patient that there may be a patient responsible charge related to this service. The patient expressed understanding and agreed to proceed.   Other persons participating in the visit and their role in the encounter:  none  Patient's location:  Her car Provider's location:  My office  CURRENT THERAPY:  anastrozole, palbociclib, denosumab  INTERVAL HISTORY:  [name] is here for a follow up of stage IV breast cancer. I am seeing this patient out of courtesy for Dr. Jana Hakim, who is out of town. She presents to the clinic alone. Unfortunately, I was running behind today, and she left before I could see her. She reports lymphedema and is heading to her rehab appointment when I called her.  She is clinically doing well, denies any pain, nausea, or other new symptoms.  He has good appetite and energy level.  He has chronic lymphedema which she is undergoing physical therapy.  She is tolerating anastrozole and Ibrance well.  She reports she just began week two of her ibrance today.    All other systems were reviewed with the patient and are negative.  MEDICAL HISTORY:  Past Medical History:  Diagnosis Date  . Back muscle spasm    occasional tx with skelaxin  . Cancer ([place])    breast ca  . GERD (gastroesophageal reflux disease)   . Malignant neoplasm of breast (female), unspecified site   . Radiation 10/24/13-12/09/13   Left chestwall/supraclav./scar    SURGICAL HISTORY: Past Surgical History:  Procedure Laterality Date  . AXILLARY LYMPH NODE DISSECTION Left 06/16/2013   Procedure: LEFT AXILLARY NODE DISSECTION;  Surgeon: Shann Medal, MD;  Location: [facility];  Service: General;  Laterality: Left;  . BILATERAL TOTAL MASTECTOMY WITH AXILLARY LYMPH NODE DISSECTION    . BREAST IMPLANT REMOVAL Bilateral 03/18/2018   Procedure: REMOVAL BREAST IMPLANTS;  Surgeon: Wallace Going, DO;  Location: [facility];  Service: Plastics;  Laterality: Bilateral;  . BREAST SURGERY    . CAPSULECTOMY Bilateral 03/18/2018   Procedure: CAPSULECTOMY;  Surgeon: Wallace Going, DO;  Location: [facility];  Service: Plastics;  Laterality: Bilateral;  . LAPAROSCOPIC BILATERAL SALPINGO OOPHERECTOMY Bilateral 01/25/2014   Procedure: LAPAROSCOPIC BILATERAL SALPINGO OOPHORECTOMY;  Surgeon: Eldred Manges, MD;  Location: [facility];  Service: Gynecology;  Laterality: Bilateral;  . LEEP    . PLACEMENT OF BREAST IMPLANTS Bilateral 03/18/2018   Procedure: PLACEMENT OF BREAST IMPLANTS;  Surgeon: Wallace Going, DO;  Location: [facility];  Service: Plastics;  Laterality: Bilateral;  . PORT-A-CATH REMOVAL N/A 01/25/2014   Procedure: REMOVAL PORT-A-CATH;  Surgeon: Alphonsa Overall, MD;  Location: [facility];  Service: General;  Laterality: N/A;  . PORTACATH PLACEMENT N/A 06/16/2013   Procedure: POWER PORT PLACEMENT;  Surgeon: Shann Medal, MD;  Location: [facility];  Service: General;  Laterality: N/A;  . right leg surgery     broken femur - has rod in leg   . TONSILLECTOMY    . WISDOM TOOTH EXTRACTION      I have reviewed the social history and family history with the patient and they are unchanged from previous note.  ALLERGIES:  is allergic to sulfa antibiotics, gadolinium derivatives, and petrolatum.  MEDICATIONS:  Current Outpatient Medications  Medication Sig Dispense Refill  . anastrozole (ARIMIDEX) 1 MG tablet Take 1 tablet (1 mg total) by mouth daily. 90 tablet 4  . palbociclib (IBRANCE) 75 MG tablet Take 1 tablet (75 mg total) by mouth daily. Take for 21 days on, 7 days off, repeat every 28 days. Start Thursday July 26, 2020 21 tablet 6   No current facility-administered medications for this visit.    PHYSICAL EXAMINATION: ECOG PERFORMANCE STATUS: 0 - Asymptomatic  Vitals:   09/27/20 1459  BP: 129/83  Pulse: 81  Resp: 17  Temp: (!) 97.5 F (36.4 C)  SpO2: 100%   Filed Weights   09/27/20 1459  Weight: 202 lb 12.8 oz (92 kg)    Exam not performed today.   LABORATORY DATA:  I have reviewed the data as listed CBC Latest Ref Rng & Units 09/27/2020 08/30/2020 08/02/2020  WBC 4.0 - 10.5 K/uL 2.1(L) 2.1(L) 2.1(L)  Hemoglobin 12.0 - 15.0 g/dL 10.3(L) 9.7(L) 9.4(L)  Hematocrit 36.0 - 46.0 % 32.9(L) 30.4(L) 30.1(L)  Platelets 150 - 400 K/uL 224 250 285     CMP Latest Ref Rng & Units 09/27/2020 08/30/2020 08/02/2020  Glucose 70 - 99 mg/dL 76 82 99  BUN 6 - 20 mg/dL _0 Creatinine 0.44 - 1.00 mg/dL 0.72 0.75 0.81  Sodium 135 - 145 mmol/L 142 141 143  Potassium 3.5 - 5.1 mmol/L 3.9 3.9 4.1  Chloride 98 - 111 mmol/L 112(H) 110 109  CO2 22 - 32 mmol/L 21(L) 24 23  Calcium 8.9 - 10.3 mg/dL 7.9(L) 8.6(L) 8.8(L)  Total Protein 6.5 - 8.1 g/dL 7.4 7.6 8.1  Total Bilirubin 0.3 - 1.2 mg/dL 0.3 0.2(L) 0.3  Alkaline Phos 38 - 126 U/L 134(H) 118 101  AST 15 - 41 U/L _1 ALT 0 - 44 U/L _2 RADIOGRAPHIC STUDIES: I have personally reviewed the radiological images as listed and agreed with the findings in the  report. No results found.   ASSESSMENT & PLAN:  [name] is a 45 y.o. female with   1. Stage IV breast cancer, s/p bilateral mastectomies -initially diagnosed in 2011 and underwent bilateral mastectomies 01/02/10 for stage pT2, pN0(i+), stage IIA invasive lobular breast cancer, stage 3, ER+/PR+, Her2- -She received adjuvant chemo CMF and tamoxifen. -Developed left axillary recurrence in 04/2013. Left axillary dissection showed 10/15 involved lymph nodes. ([place], N3 = stage IIIC) -She received carboplatin/docetaxel and left postmastectomy radiation therapy. -She has continued lymphedema. -Tamoxifen again taken from 02/2014 - 03/2020. -metastatic disease noted 03/2020 -She is currently receiving anastrozole, palbociclib, and Xgeva. She is tolerating well, except moderate neutropenia without infections.  We will continue at the same dose.  2. Hypocalcemia - Her calcium has decreased over the past several months, 7.9 today (09/27/20) -I recommended she get an OTC calcium supplement  3.  Neutropenia and anemia -  Neutropenia is secondary to Ibrance, anemia is likely related to underlying malignancy and previous chemotherapy. -Overall stable, continue clinical monitoring.   PLAN: -start OTC calcium and Vit D supplement -Xgeva completed today -f/u with Dr. Jana Hakim as scheduled on 10/25/20 -She is scheduled to have lab, injection and follow-up with Dr. Jana Hakim in a months.   No problem-specific Assessment & Plan notes found for this encounter.   No orders of the defined types were placed in this encounter.  All questions were answered. The patient knows to call the clinic with any problems, questions or concerns. No barriers to learning was detected. The total time spent in the appointment was 15 minutes.     Truitt Merle, MD 09/27/2020   I, Wilburn Mylar, am acting as scribe for Truitt Merle, MD.   I have reviewed the above documentation for accuracy and completeness, and I agree  with the above.

## 2020-09-28 ENCOUNTER — Encounter: Payer: Self-pay | Admitting: Oncology

## 2020-09-28 LAB — CANCER ANTIGEN 27.29: CA 27.29: 36 U/mL (ref 0.0–38.6)

## 2020-10-03 ENCOUNTER — Other Ambulatory Visit: Payer: Self-pay | Admitting: *Deleted

## 2020-10-03 ENCOUNTER — Ambulatory Visit: Payer: BC Managed Care – PPO | Admitting: Physical Therapy

## 2020-10-03 ENCOUNTER — Encounter: Payer: Self-pay | Admitting: Oncology

## 2020-10-03 DIAGNOSIS — C50911 Malignant neoplasm of unspecified site of right female breast: Secondary | ICD-10-CM

## 2020-10-03 DIAGNOSIS — H0589 Other disorders of orbit: Secondary | ICD-10-CM

## 2020-10-03 DIAGNOSIS — C771 Secondary and unspecified malignant neoplasm of intrathoracic lymph nodes: Secondary | ICD-10-CM

## 2020-10-20 ENCOUNTER — Other Ambulatory Visit: Payer: Self-pay

## 2020-10-20 ENCOUNTER — Ambulatory Visit
Admission: RE | Admit: 2020-10-20 | Discharge: 2020-10-20 | Disposition: A | Discharge status: Home / Self Care | Payer: BC Managed Care – PPO | Source: Ambulatory Visit | Attending: Oncology | Admitting: Oncology

## 2020-10-20 DIAGNOSIS — H0589 Other disorders of orbit: Secondary | ICD-10-CM

## 2020-10-20 DIAGNOSIS — C50919 Malignant neoplasm of unspecified site of unspecified female breast: Secondary | ICD-10-CM | POA: Diagnosis not present

## 2020-10-20 DIAGNOSIS — C771 Secondary and unspecified malignant neoplasm of intrathoracic lymph nodes: Secondary | ICD-10-CM

## 2020-10-20 DIAGNOSIS — C50911 Malignant neoplasm of unspecified site of right female breast: Secondary | ICD-10-CM

## 2020-10-20 MED ORDER — GADOBENATE DIMEGLUMINE 529 MG/ML IV SOLN
18.0000 mL | Freq: Once | INTRAVENOUS | Status: AC | PRN
  Administered 2020-10-20: 18 mL via INTRAVENOUS

## 2020-10-25 ENCOUNTER — Inpatient Hospital Stay (HOSPITAL_BASED_OUTPATIENT_CLINIC_OR_DEPARTMENT_OTHER): Payer: BC Managed Care – PPO | Admitting: Oncology

## 2020-10-25 ENCOUNTER — Inpatient Hospital Stay: Payer: BC Managed Care – PPO

## 2020-10-25 ENCOUNTER — Other Ambulatory Visit: Payer: Self-pay

## 2020-10-25 ENCOUNTER — Other Ambulatory Visit (HOSPITAL_COMMUNITY): Payer: Self-pay

## 2020-10-25 ENCOUNTER — Encounter: Payer: Self-pay | Admitting: Oncology

## 2020-10-25 ENCOUNTER — Telehealth: Payer: Self-pay | Admitting: Oncology

## 2020-10-25 ENCOUNTER — Other Ambulatory Visit: Payer: Self-pay | Admitting: Pharmacist

## 2020-10-25 VITALS — BP 125/86 | HR 81 | Temp 97.7°F | Resp 18 | Ht 68.0 in | Wt 204.4 lb

## 2020-10-25 DIAGNOSIS — C50912 Malignant neoplasm of unspecified site of left female breast: Secondary | ICD-10-CM

## 2020-10-25 DIAGNOSIS — C773 Secondary and unspecified malignant neoplasm of axilla and upper limb lymph nodes: Secondary | ICD-10-CM

## 2020-10-25 DIAGNOSIS — C771 Secondary and unspecified malignant neoplasm of intrathoracic lymph nodes: Secondary | ICD-10-CM

## 2020-10-25 DIAGNOSIS — Z170 Estrogen receptor positive status [ER+]: Secondary | ICD-10-CM

## 2020-10-25 DIAGNOSIS — C50919 Malignant neoplasm of unspecified site of unspecified female breast: Secondary | ICD-10-CM

## 2020-10-25 DIAGNOSIS — C787 Secondary malignant neoplasm of liver and intrahepatic bile duct: Secondary | ICD-10-CM | POA: Diagnosis not present

## 2020-10-25 DIAGNOSIS — C7951 Secondary malignant neoplasm of bone: Secondary | ICD-10-CM

## 2020-10-25 DIAGNOSIS — C50812 Malignant neoplasm of overlapping sites of left female breast: Secondary | ICD-10-CM | POA: Diagnosis not present

## 2020-10-25 DIAGNOSIS — Z79899 Other long term (current) drug therapy: Secondary | ICD-10-CM | POA: Diagnosis not present

## 2020-10-25 LAB — CMP (CANCER CENTER ONLY)
ALT: 11 U/L (ref 0–44)
AST: 17 U/L (ref 15–41)
Albumin: 4 g/dL (ref 3.5–5.0)
Alkaline Phosphatase: 153 U/L — ABNORMAL HIGH (ref 38–126)
Anion gap: 8 (ref 5–15)
BUN: 15 mg/dL (ref 6–20)
CO2: 24 mmol/L (ref 22–32)
Calcium: 9.8 mg/dL (ref 8.9–10.3)
Chloride: 108 mmol/L (ref 98–111)
Creatinine: 0.8 mg/dL (ref 0.44–1.00)
GFR, Estimated: >60 mL/min (ref >60)
Glucose, Bld: 89 mg/dL (ref 70–99)
Potassium: 4.1 mmol/L (ref 3.5–5.1)
Sodium: 140 mmol/L (ref 135–145)
Total Bilirubin: 0.2 mg/dL — ABNORMAL LOW (ref 0.3–1.2)
Total Protein: 8 g/dL (ref 6.5–8.1)

## 2020-10-25 LAB — CBC WITH DIFFERENTIAL (CANCER CENTER ONLY)
Abs Immature Granulocytes: 0 10*3/uL (ref 0.00–0.07)
Basophils Absolute: 0 10*3/uL (ref 0.0–0.1)
Basophils Relative: 1 %
Eosinophils Absolute: 0 10*3/uL (ref 0.0–0.5)
Eosinophils Relative: 1 %
HCT: 33.4 % — ABNORMAL LOW (ref 36.0–46.0)
Hemoglobin: 10.9 g/dL — ABNORMAL LOW (ref 12.0–15.0)
Immature Granulocytes: 0 %
Lymphocytes Relative: 58 %
Lymphs Abs: 1.5 10*3/uL (ref 0.7–4.0)
MCH: 29.7 pg (ref 26.0–34.0)
MCHC: 32.6 g/dL (ref 30.0–36.0)
MCV: 91 fL (ref 80.0–100.0)
Monocytes Absolute: 0.2 10*3/uL (ref 0.1–1.0)
Monocytes Relative: 6 %
Neutro Abs: 0.9 10*3/uL — ABNORMAL LOW (ref 1.7–7.7)
Neutrophils Relative %: 34 %
Platelet Count: 244 10*3/uL (ref 150–400)
RBC: 3.67 MIL/uL — ABNORMAL LOW (ref 3.87–5.11)
RDW: 15.2 % (ref 11.5–15.5)
WBC Count: 2.6 10*3/uL — ABNORMAL LOW (ref 4.0–10.5)
nRBC: 0 % (ref 0.0–0.2)

## 2020-10-25 LAB — SAMPLE TO BLOOD BANK

## 2020-10-25 MED ORDER — DENOSUMAB 120 MG/1.7ML ~~LOC~~ SOLN
120.0000 mg | Freq: Once | SUBCUTANEOUS | Status: AC
  Administered 2020-10-25: 120 mg via SUBCUTANEOUS
  Filled 2020-10-25: qty 1.7

## 2020-10-25 MED ORDER — PALBOCICLIB 75 MG PO TABS: 75.0000 mg | ORAL_TABLET | Freq: Every day | ORAL | 6 refills | Status: DC

## 2020-10-25 MED ORDER — ANASTROZOLE 1 MG PO TABS: 1.0000 mg | ORAL_TABLET | Freq: Every day | ORAL | 4 refills | Status: DC

## 2020-10-25 MED ORDER — PALBOCICLIB 75 MG PO TABS
75.0000 mg | ORAL_TABLET | Freq: Every day | ORAL | 6 refills | Status: DC
  Filled 2020-10-25: qty 21, 21d supply, fill #0

## 2020-10-25 NOTE — Telephone Encounter (Signed)
Scheduled appointment per 06/30 los. Patient is aware.

## 2020-10-25 NOTE — Patient Instructions (Signed)
Denosumab injection What is this medication? DENOSUMAB (den oh sue mab) slows bone breakdown. Prolia is used to treat osteoporosis in women after menopause and in men, and in people who are taking corticosteroids for 6 months or more. Delton See is used to treat a high calcium level due to cancer and to prevent bone fractures and other bone problems caused by multiple myeloma or cancer bone metastases. Delton See is also used totreat giant cell tumor of the bone. This medicine may be used for other purposes; ask your health care provider orpharmacist if you have questions. COMMON BRAND NAME(S): Prolia, XGEVA What should I tell my care team before I take this medication? They need to know if you have any of these conditions: dental disease having surgery or tooth extraction infection kidney disease low levels of calcium or Vitamin D in the blood malnutrition on hemodialysis skin conditions or sensitivity thyroid or parathyroid disease an unusual reaction to denosumab, other medicines, foods, dyes, or preservatives pregnant or trying to get pregnant breast-feeding How should I use this medication? This medicine is for injection under the skin. It is given by a health careprofessional in a hospital or clinic setting. A special MedGuide will be given to you before each treatment. Be sure to readthis information carefully each time. For Prolia, talk to your pediatrician regarding the use of this medicine in children. Special care may be needed. For Delton See, talk to your pediatrician regarding the use of this medicine in children. While this drug may be prescribed for children as young as 13 years for selected conditions,precautions do apply. Overdosage: If you think you have taken too much of this medicine contact apoison control center or emergency room at once. NOTE: This medicine is only for you. Do not share this medicine with others. What if I miss a dose? It is important not to miss your dose. Call  your doctor or health careprofessional if you are unable to keep an appointment. What may interact with this medication? Do not take this medicine with any of the following medications: other medicines containing denosumab This medicine may also interact with the following medications: medicines that lower your chance of fighting infection steroid medicines like prednisone or cortisone This list may not describe all possible interactions. Give your health care provider a list of all the medicines, herbs, non-prescription drugs, or dietary supplements you use. Also tell them if you smoke, drink alcohol, or use illegaldrugs. Some items may interact with your medicine. What should I watch for while using this medication? Visit your doctor or health care professional for regular checks on your progress. Your doctor or health care professional may order blood tests andother tests to see how you are doing. Call your doctor or health care professional for advice if you get a fever, chills or sore throat, or other symptoms of a cold or flu. Do not treat yourself. This drug may decrease your body's ability to fight infection. Try toavoid being around people who are sick. You should make sure you get enough calcium and vitamin D while you are taking this medicine, unless your doctor tells you not to. Discuss the foods you eatand the vitamins you take with your health care professional. See your dentist regularly. Brush and floss your teeth as directed. Before youhave any dental work done, tell your dentist you are receiving this medicine. Do not become pregnant while taking this medicine or for 5 months after stopping it. Talk with your doctor or health care professional about your  birth control options while taking this medicine. Women should inform their doctor if they wish to become pregnant or think they might be pregnant. There is a potential for serious side effects to an unborn child. Talk to your health  careprofessional or pharmacist for more information. What side effects may I notice from receiving this medication? Side effects that you should report to your doctor or health care professionalas soon as possible: allergic reactions like skin rash, itching or hives, swelling of the face, lips, or tongue bone pain breathing problems dizziness jaw pain, especially after dental work redness, blistering, peeling of the skin signs and symptoms of infection like fever or chills; cough; sore throat; pain or trouble passing urine signs of low calcium like fast heartbeat, muscle cramps or muscle pain; pain, tingling, numbness in the hands or feet; seizures unusual bleeding or bruising unusually weak or tired Side effects that usually do not require medical attention (report to yourdoctor or health care professional if they continue or are bothersome): constipation diarrhea headache joint pain loss of appetite muscle pain runny nose tiredness upset stomach This list may not describe all possible side effects. Call your doctor for medical advice about side effects. You may report side effects to FDA at1-800-FDA-1088. Where should I keep my medication? This medicine is only given in a clinic, doctor's office, or other health caresetting and will not be stored at home. NOTE: This sheet is a summary. It may not cover all possible information. If you have questions about this medicine, talk to your doctor, pharmacist, orhealth care provider.  2022 Elsevier/Gold Standard (2017-08-21 16:10:44)

## 2020-10-25 NOTE — Progress Notes (Addendum)
[place]  Telephone:([phone]) [phone] Fax:(336) [phone]      ID: [name] OB: May 03, 1975  MR#: [id number]  NIO#:[id number]  Patient Care Team: Jana Hakim Virgie Dad, MD as PCP - General (Oncology) Lilyona Richner, Virgie Dad, MD as Consulting Physician (Oncology) Alphonsa Overall, MD as Consulting Physician (General Surgery) Gavin Pound, MD as Consulting Physician (Rheumatology) Katy Fitch, Darlina Guys, MD as Consulting Physician (Ophthalmology) Kyung Rudd, MD as Consulting Physician (Radiation Oncology) OTHER MD: Annette Stable [phone])  CHIEF COMPLAINT:  Stage IV breast cancer, estrogen receptor positive (s/p bilateral mastectomies)  CURRENT TREATMENT:  anastrozole, palbociclib, denosumab   INTERVAL HISTORY: [name] returns today for follow up of her stage IV breast cancer.  Since her last visit, she underwent restaging brain MRI on 10/20/2020 showing: no evidence of intracranial metastatic disease or acute abnormality; ill-defined T1 hypointense and enhancing soft tissue about lateral and superior left globe remains concerning for metastatic disease and appears similar to 04/2020 MRI; similar diffuse osseous metastatic disease involving calvarium, skull base, and visualized upper cervical spine.  She continues on anastrozole, which she started 05/01/2020.  She tolerates this with occasional hot flashes but no other significant side effects  She also continues on palbociclib.  This has been a problem because of continuing low counts.  The low counts are most likely due to bone marrow involvement by her tumor.  For that reason we changed her dose the current cycle to 125 mg every other day with cycle 2 and now to 75 mg daily which I hope will be more tolerable  We are following her CA 27-29.  This has normalized Lab Results  Component Value Date   CA2729 36.0 09/27/2020   CA2729 72.8 (H) 08/30/2020   CA2729 127.7 (H) 08/02/2020   CA2729 163.5 (H) 07/05/2020    CA2729 181.4 (H) 06/20/2020   Of note, she also underwent colonoscopy on 09/14/2020 under Dr. Collene Mares. The exam was normal, and surveillance colonoscopy was not recommended.   REVIEW OF SYSTEMS: [name] tells me she is now "doing good".  She was very tired before due to her anemia.  She tolerated the transfusion without any problems.  She is exercising on her treadmill regularly.  She is working from home.  She has a very good diet she says, having a shake in the morning and then skipping lunch and eating a good supper with meat and vegetables.  She is actually gaining weight on this diet.  She denies double vision and says she has some slight blurring in the left eye in the morning but this clears during the day.  She denies headaches nausea or vomiting.  A detailed review of systems today was otherwise stable   COVID 19 VACCINATION STATUS: Not vaccinated as of 05/24/2020   BREAST CANCER HISTORY:  From the original intake note:  [name] had bilateral diagnostic mammography [facility] in [place] 11/30/2009 showing a 1.5 cm mass at the 9:00 position of the left breast with some satellites. A biopsy of the mass in question [facility] showed ([id number]) and invasive lobular carcinoma, which was estrogen and progesterone receptor both 100% positive, both with strong staining intensity, but HER-2 negative at 1+. Bilateral breast MRI 12/13/2009 in [place] showed in the left breast an irregularly marginated mass measuring 3.2 cm. There were 4 or 5 separate satellite lesions laterally and superior to the primary.  Accordingly, after appropriate discussion, the patient underwent bilateral mastectomies with left sentinel lymph node sampling 01/02/2010. The right  breast was benign. The left breast showed a 2.3 cm invasive lobular carcinoma, grade 3, with a total of 6 sentinel lymph nodes removed, all negative, although 2 showed isolated tumor cells by immunostaining. Margins  were negative.  An Oncotype BX was sent, with a recurrence score of 22, predicting a risk of distant recurrence within 10 years with 14% of the patients only systemic treatment was tamoxifen. The patient was treated with CMF chemotherapy between 02/22/2010 and 07/05/2010, receiving 7 cycles at which point the patient refused further chemotherapy though there have been no major toxicities at according to the oncology note from Dr. Brent General. The patient was then started on tamoxifen 08/09/2010. By her cancer took approximately 12-15 months then stopped because of hot flashes and insomnia problems.  More recently, the patient noted a change in her left axilla andunderwent bilateral breast MRI January to 2015 at [place]. The patient has bilateral submuscular silicone implants in place. The breast were unremarkable, but there were several lymph nodes with cortical thickening in the left axilla including a dominant 1 measuring 1.2 cm in the short axis. Ultrasonography of this area identified the lymph node in question and the patient underwent biopsy of the left axillary lymph node This showed ([id number]) near-total replacement of the lymph node by metastatic carcinoma. This was 96% estrogen receptor positive, and 84% progesterone receptor positive, both with strong staining intensity. The MIB-1 was 20%. HER-2 determination is pending.  The patient's subsequent history is as detailed below   PAST MEDICAL HISTORY: Past Medical History:  Diagnosis Date   Back muscle spasm    occasional tx with skelaxin   Cancer ([place])    breast ca   GERD (gastroesophageal reflux disease)    Malignant neoplasm of breast (female), unspecified site    Radiation 10/24/13-12/09/13   Left chestwall/supraclav./scar    PAST SURGICAL HISTORY: Past Surgical History:  Procedure Laterality Date   AXILLARY LYMPH NODE DISSECTION Left 06/16/2013   Procedure: LEFT AXILLARY NODE DISSECTION;  Surgeon: Shann Medal, MD;   Location: [facility];  Service: General;  Laterality: Left;   BILATERAL TOTAL MASTECTOMY WITH AXILLARY LYMPH NODE DISSECTION     BREAST IMPLANT REMOVAL Bilateral 03/18/2018   Procedure: REMOVAL BREAST IMPLANTS;  Surgeon: Wallace Going, DO;  Location: [facility];  Service: Plastics;  Laterality: Bilateral;   BREAST SURGERY     CAPSULECTOMY Bilateral 03/18/2018   Procedure: CAPSULECTOMY;  Surgeon: Wallace Going, DO;  Location: [facility];  Service: Plastics;  Laterality: Bilateral;   LAPAROSCOPIC BILATERAL SALPINGO OOPHERECTOMY Bilateral 01/25/2014   Procedure: LAPAROSCOPIC BILATERAL SALPINGO OOPHORECTOMY;  Surgeon: Eldred Manges, MD;  Location: [facility];  Service: Gynecology;  Laterality: Bilateral;   LEEP     PLACEMENT OF BREAST IMPLANTS Bilateral 03/18/2018   Procedure: PLACEMENT OF BREAST IMPLANTS;  Surgeon: Wallace Going, DO;  Location: [facility];  Service: Plastics;  Laterality: Bilateral;   PORT-A-CATH REMOVAL N/A 01/25/2014   Procedure: REMOVAL PORT-A-CATH;  Surgeon: Alphonsa Overall, MD;  Location: [facility];  Service: General;  Laterality: N/A;   PORTACATH PLACEMENT N/A 06/16/2013   Procedure: POWER PORT PLACEMENT;  Surgeon: Shann Medal, MD;  Location: [facility];  Service: General;  Laterality: N/A;   right leg surgery     broken femur - has rod in leg   TONSILLECTOMY     WISDOM TOOTH EXTRACTION      FAMILY HISTORY Family History  Problem Relation Age of Onset  Other Mother 56       glioblastoma; deceased 51   Cancer Mother    Other Father 14       glioblastoma; deceased 61   Cancer Father    Hypertension Other    according to the patient both her parents died from a primary brain cancers, namely we have the stomas, her father at 9, her mother at 47. The patient had one brother and one sister. There is no history of breast or ovarian cancer in the family   GYNECOLOGIC HISTORY:   (Reviewed 10/10/2013) Menarche age 24.  The patient has never carried a child to term. She was still having regular periods at the time of her recent diagnosis. LMP 07/18/2013 as of 09/08/2013.  The patient took oral contraceptives between the ages of 26 and 19 with no complications. She stopped having periods with her chemotherapy in 2015.    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 She currently works from [employer] for [employer] Kitchen  Her husband Burnice Logan works at [employer] and also in [place]: In the absence of any documents to the contrary the patient's husband is her healthcare power of attorney   HEALTH MAINTENANCE:  Social History   Tobacco Use   Smoking status: Never   Smokeless tobacco: Never  Substance Use Topics   Alcohol use: Yes    Comment: socially wine   Drug use: No     Colonoscopy: 08/2020 (Dr. Collene Mares), recall not indicated  PAP: December 2014  Bone density: Never  Lipid panel: Not on file   Allergies  Allergen Reactions   Sulfa Antibiotics Hives and Itching   Gadolinium Derivatives Hives    After gado injection, pt had a few small hives on left breast area. Treated with benadryl by dr Janeece Fitting    Petrolatum Hives and Rash    Current Outpatient Medications  Medication Sig Dispense Refill   anastrozole (ARIMIDEX) 1 MG tablet Take 1 tablet (1 mg total) by mouth daily. 90 tablet 4   palbociclib (IBRANCE) 75 MG tablet Take 1 tablet (75 mg total) by mouth daily. Take for 21 days on, 7 days off, repeat every 28 days. 21 tablet 6   No current facility-administered medications for this visit.    OBJECTIVE: African-American woman who appears stated age  58:   10/25/20 1344  BP: 125/86  Pulse: 81  Resp: 18  Temp: 97.7 F (36.5 C)  SpO2: 100%   Wt Readings from Last 3 Encounters:  10/25/20 204 lb 6.4  oz (92.7 kg)  09/27/20 202 lb 12.8 oz (92 kg)  08/30/20 197 lb 6.4 oz (89.5 kg)   Body mass index is 31.08 kg/m.    ECOG FS:1 - Symptomatic but completely ambulatory  Sclerae unicteric, EOMs intact, mild left ptosis Wearing a mask No cervical or supraclavicular adenopathy Lungs no rales or rhonchi Heart regular rate and rhythm Abd soft, nontender, positive bowel sounds MSK no focal spinal tenderness, no upper extremity lymphedema Neuro: nonfocal, well oriented, appropriate affect Breasts: Status post bilateral mastectomies with bilateral reconstruction.  There is no evidence of chest wall recurrence.  Both axillae are benign.   LAB RESULTS: Lab Results  Component Value Date   WBC 2.6 (L) 10/25/2020   NEUTROABS 0.9 (L) 10/25/2020   HGB 10.9 (L) 10/25/2020  HCT 33.4 (L) 10/25/2020   MCV 91.0 10/25/2020   PLT 244 10/25/2020      Chemistry      Component Value Date/Time   NA 140 10/25/2020 1315   NA 141 10/30/2016 0813   K 4.1 10/25/2020 1315   K 4.0 10/30/2016 0813   CL 108 10/25/2020 1315   CO2 24 10/25/2020 1315   CO2 26 10/30/2016 0813   BUN 15 10/25/2020 1315   BUN 15.2 10/30/2016 0813   CREATININE 0.80 10/25/2020 1315   CREATININE 0.8 10/30/2016 0813      Component Value Date/Time   CALCIUM 9.8 10/25/2020 1315   CALCIUM 10.0 10/30/2016 0813   ALKPHOS 153 (H) 10/25/2020 1315   ALKPHOS 59 10/30/2016 0813   AST 17 10/25/2020 1315   AST 17 10/30/2016 0813   ALT 11 10/25/2020 1315   ALT 11 10/30/2016 0813   BILITOT 0.2 (L) 10/25/2020 1315   BILITOT 0.26 10/30/2016 0813      STUDIES: MR Brain W Wo Contrast  Result Date: 10/21/2020 CLINICAL DATA:  Brain mass or lesion.  Breast cancer. EXAM: MRI HEAD WITHOUT AND WITH CONTRAST TECHNIQUE: Multiplanar, multiecho pulse sequences of the brain and surrounding structures were obtained without and with intravenous contrast. CONTRAST:  21m MULTIHANCE GADOBENATE DIMEGLUMINE 529 MG/ML IV SOLN COMPARISON:  MRI  05/09/2020 FINDINGS: Brain: No acute infarction, hemorrhage, hydrocephalus, extra-axial collection or mass lesion. Vascular: Major arterial flow voids are maintained at the skull base. Skull and upper cervical spine: Diffuse heterogeneity of the calvarial, skull base and upper cervical bone marrow with heterogeneous enhancement, compatible with known diffuse osseous metastatic disease. Sinuses/Orbits: Ill-defined T1 hypointense and enhancing soft tissue about the lateral and superior left globe appears similar to 05/09/2020 MRI. Clear sinuses. Other: No mastoid effusions. IMPRESSION: 1. No evidence of intracranial metastatic disease or acute abnormality. 2. Ill-defined T1 hypointense and enhancing soft tissue about the lateral and superior left globe remains concerning for metastatic disease and appears similar to 05/09/2020 MRI, although evaluation is somewhat limited on this standard brain MRI without fat saturated postcontrast imaging. Dedicated MRI of the orbits with contrast could further assess if clinically indicated. 3. Similar diffuse osseous metastatic disease involving the calvarium, skull base, and visualized upper cervical spine. Electronically Signed   By: FMargaretha SheffieldMD   On: 10/21/2020 18:20      ASSESSMENT: 45y.o. BRCA negative [place], [place] woman  (1) status post bilateral mastectomies with left axillary lymph node sampling [6 nodes removed] 01/02/2010 for a pT2 pN0(i+), stage IIA invasive lobular breast cancer, grade 3, estrogen and progesterone receptor positive, HER-2 not amplified  (a) right breast was benign  (2) Oncotype DX score of 22 predicted a 14% risk of distant recurrence within 10 years if the patient's only systemic treatment is tamoxifen for 5 years  (3) status post CMF(cyclophosphamide, fluorouracil, methotrexate) x7 given between October of 2011 and March of 2012 (7 of 8 planned treatments completed)  (4) tamoxifen started April 2012, discontinued approximately  July of 2013 (about 15 months) because of problems with hot flashes and insomnia  (5) pathologically documented left axillary recurrence 05/05/2013, the tumor being again estrogen and progesterone receptor positive, with HER-2 not amplified, and MIB-1 of 15%. Staging CT/PET 05/27/2013 show left axillary and supraclavicular recurrence, no distant disease  (6) status post completion left axillary lymph node dissection 06/16/2013 showing a total of 10/15 lymph nodes involved by tumor, with evidence of extracapsular extension (TX N3 = stage IIIC)  (7) treated  with carboplatin/ docetaxel, first dose on 07/11/2013, repeated every 21 days x 4 with Neulasta support on day 2.  Final dose was given on 09/12/2013, with chemotherapy discontinued after 4 cycles due to continuing low counts.  (8) radiation completed 12/09/2013: Left chest wall / 50.4 Gray @ 1.8 Gray per fraction x 28 fractions Left Supraclavicular fossa and PAB/ 20 Gray @1 .8 Gray per fraction x 25 fractions Left scar / 10 Gray at [employer] per fraction x 5 fractions   (9) bilateral salpingo-oophorectomy on 01/25/14  (10)   lymphedema and limited range of motion in the left upper extremity secondary to left axillary lymph node dissection, being treated through the lymphedema clinic  - improved   (13) sequencing and deletion/duplication analysis of 17 genes performed September 2015 at [facility] showed no deleterious mutations in  ATM, BARD1, BRCA1, BRCA2, BRIP1, CDH1, CHEK2, MRE11A, MUTYH, NBN, NF1, PALB2, PTEN, RAD50, RAD51C, RAD51D, and TP53.  (14) tamoxifen re-started 02/27/2014, discontinued December 2021 with progression  (15) bilateral breast implant exchange with capsulotomies 03/18/2018 following left implant rupture and bilateral capsular contractures. Left - [employer]or Smooth Round Ultra High Profile Gel 590cc. Ref #[id number].  Serial Number [id number] Right - [employer] Smooth Round Ultra High Profile Gel 590cc. Ref #[id number].  Serial  Number [id number]  METASTATIC DISEASE: December 2021 (16) bone marrow biopsy 04/23/2020 shows metastatic carcinoma, estrogen and progesterone receptor positive  (a) MRI of the cervical spine 04/07/2020 shows markedly abnormal marrow signal, prominent right axillary lymph nodes, degenerative disc disease  (b) MRI of the orbits 04/13/2020 suggests metastatic disease around the left orbit and diffusely abnormal bone marrow signal  (c) CT scan of the chest, abdomen and pelvis 05/10/2020 shows extensive adenopathy (right axillary, prevascular, right hilar, celiac and portacaval axes), multiple liver masses, multiple sclerotic bone lesions  (d) bone scan 05/10/2020 shows uptake through the pelvis spine sternum and ribs and possibly the right mandible.  Consistent with widespread bony metastatic disease  (e) brain MRI 05/09/2020 shows no evidence of intracranial metastases, with left periorbital disease as previously documented  (f) CA 27-29 is informative (was 155.5 05/11/2020)  (17) radiation to the left periorbital region completed 05/31/2020  (a) repeat brain MRI shows no evidence of intracranial metastases.  There are skull metastases and there continues to be ill-defined soft tissue around the left globe  (18) anastrozole started 05/01/2020  (a) palbociclib started 05/11/2020  (b) dose reduced with second cycle to 75 mg daily due to cytopenias  (c) CT of the abdomen and pelvis 08/29/2020 shows significant response  (d) CT of the chest abdomen and pelvis August 2022  (19) denosumab/ Xgeva started 07/05/2020, repeated every 28 d   PLAN: Daliah is now 6 months out from definitive diagnosis of metastatic breast cancer.  Her disease is well controlled and currently she has no symptoms directly related to the tumor.  She is also tolerating treatment well, with no side effects from her treatment other than continuing cytopenias.  This is due primarily however to bone marrow infiltration by her  tumor.  We have dropped the palbociclib dose to 75 mg daily and will accept a borderline low neutrophil count given the good response the patient is having.    She will see me again in 8 weeks.  She will be ready completely restaged mid August.  She knows to call for any other issue that may develop before the next visit  Total encounter time 30 minutes.*  Taquilla Downum, Virgie Dad, MD  10/25/20  6:30 PM Medical Oncology and Hematology [facility] [place], [place] [address] Tel. [phone]    Fax. [phone]   I, Wilburn Mylar, am acting as scribe for Dr. Virgie Dad. Precilla Purnell.  I, Lurline Del MD, have reviewed the above documentation for accuracy and completeness, and I agree with the above.   *Total Encounter Time as defined by the Centers for Medicare and Medicaid Services includes, in addition to the face-to-face time of a patient visit (documented in the note above) non-face-to-face time: obtaining and reviewing outside history, ordering and reviewing medications, tests or procedures, care coordination (communications with other health care professionals or caregivers) and documentation in the medical record.

## 2020-10-25 NOTE — Progress Notes (Signed)
Oral Oncology Pharmacist Encounter  Prescription refill for Ibrance (palbociclib) sent to [facility] in error. Patient's insurance requires Rx be filled through [facility] - redirected to [facility] in [place], [place] for dispensing.    *Note previous [facility] in [place], [place] has shut down and all scripts have been transferred to [place], [place]. Pharmacy updated in patient's chart.   Leron Croak, PharmD, BCPS Hematology/Oncology Clinical Pharmacist [facility] [phone] 10/25/2020 2:56 PM

## 2020-10-26 LAB — CANCER ANTIGEN 27.29: CA 27.29: 24.8 U/mL (ref 0.0–38.6)

## 2020-11-05 ENCOUNTER — Encounter: Payer: Self-pay | Admitting: Oncology

## 2020-11-13 ENCOUNTER — Encounter: Payer: Self-pay | Admitting: Oncology

## 2020-11-19 ENCOUNTER — Encounter: Payer: Self-pay | Admitting: Oncology

## 2020-11-22 ENCOUNTER — Inpatient Hospital Stay: Payer: BC Managed Care – PPO | Attending: Oncology

## 2020-11-22 ENCOUNTER — Inpatient Hospital Stay: Payer: BC Managed Care – PPO

## 2020-11-22 ENCOUNTER — Other Ambulatory Visit: Payer: Self-pay

## 2020-11-22 ENCOUNTER — Encounter: Payer: Self-pay | Admitting: Oncology

## 2020-11-22 VITALS — BP 108/78 | HR 72 | Temp 98.7°F | Resp 16

## 2020-11-22 DIAGNOSIS — C50812 Malignant neoplasm of overlapping sites of left female breast: Secondary | ICD-10-CM | POA: Insufficient documentation

## 2020-11-22 DIAGNOSIS — C50919 Malignant neoplasm of unspecified site of unspecified female breast: Secondary | ICD-10-CM

## 2020-11-22 DIAGNOSIS — C7951 Secondary malignant neoplasm of bone: Secondary | ICD-10-CM | POA: Diagnosis not present

## 2020-11-22 DIAGNOSIS — H16212 Exposure keratoconjunctivitis, left eye: Secondary | ICD-10-CM | POA: Diagnosis not present

## 2020-11-22 DIAGNOSIS — H5700 Unspecified anomaly of pupillary function: Secondary | ICD-10-CM | POA: Diagnosis not present

## 2020-11-22 DIAGNOSIS — C50912 Malignant neoplasm of unspecified site of left female breast: Secondary | ICD-10-CM

## 2020-11-22 LAB — CBC WITH DIFFERENTIAL (CANCER CENTER ONLY)
Abs Immature Granulocytes: 0 10*3/uL (ref 0.00–0.07)
Basophils Absolute: 0 10*3/uL (ref 0.0–0.1)
Basophils Relative: 1 %
Eosinophils Absolute: 0 10*3/uL (ref 0.0–0.5)
Eosinophils Relative: 1 %
HCT: 32.5 % — ABNORMAL LOW (ref 36.0–46.0)
Hemoglobin: 10.4 g/dL — ABNORMAL LOW (ref 12.0–15.0)
Immature Granulocytes: 0 %
Lymphocytes Relative: 63 %
Lymphs Abs: 1.4 10*3/uL (ref 0.7–4.0)
MCH: 29.4 pg (ref 26.0–34.0)
MCHC: 32 g/dL (ref 30.0–36.0)
MCV: 91.8 fL (ref 80.0–100.0)
Monocytes Absolute: 0.2 10*3/uL (ref 0.1–1.0)
Monocytes Relative: 8 %
Neutro Abs: 0.6 10*3/uL — ABNORMAL LOW (ref 1.7–7.7)
Neutrophils Relative %: 27 %
Platelet Count: 203 10*3/uL (ref 150–400)
RBC: 3.54 MIL/uL — ABNORMAL LOW (ref 3.87–5.11)
RDW: 15.2 % (ref 11.5–15.5)
WBC Count: 2.1 10*3/uL — ABNORMAL LOW (ref 4.0–10.5)
nRBC: 0 % (ref 0.0–0.2)

## 2020-11-22 LAB — CMP (CANCER CENTER ONLY)
ALT: 12 U/L (ref 0–44)
AST: 16 U/L (ref 15–41)
Albumin: 3.9 g/dL (ref 3.5–5.0)
Alkaline Phosphatase: 121 U/L (ref 38–126)
Anion gap: 6 (ref 5–15)
BUN: 11 mg/dL (ref 6–20)
CO2: 27 mmol/L (ref 22–32)
Calcium: 9.2 mg/dL (ref 8.9–10.3)
Chloride: 108 mmol/L (ref 98–111)
Creatinine: 0.83 mg/dL (ref 0.44–1.00)
GFR, Estimated: >60 mL/min (ref >60)
Glucose, Bld: 101 mg/dL — ABNORMAL HIGH (ref 70–99)
Potassium: 3.8 mmol/L (ref 3.5–5.1)
Sodium: 141 mmol/L (ref 135–145)
Total Bilirubin: 0.3 mg/dL (ref 0.3–1.2)
Total Protein: 7.6 g/dL (ref 6.5–8.1)

## 2020-11-22 LAB — SAMPLE TO BLOOD BANK

## 2020-11-22 MED ORDER — DENOSUMAB 120 MG/1.7ML ~~LOC~~ SOLN
120.0000 mg | Freq: Once | SUBCUTANEOUS | Status: AC
  Administered 2020-11-22: 120 mg via SUBCUTANEOUS

## 2020-11-22 MED ORDER — DENOSUMAB 120 MG/1.7ML ~~LOC~~ SOLN
SUBCUTANEOUS | Status: AC
  Filled 2020-11-22: qty 1.7

## 2020-11-22 NOTE — Patient Instructions (Signed)
Denosumab injection What is this medication? DENOSUMAB (den oh sue mab) slows bone breakdown. Prolia is used to treat osteoporosis in women after menopause and in men, and in people who are taking corticosteroids for 6 months or more. Delton See is used to treat a high calcium level due to cancer and to prevent bone fractures and other bone problems caused by multiple myeloma or cancer bone metastases. Delton See is also used totreat giant cell tumor of the bone. This medicine may be used for other purposes; ask your health care provider orpharmacist if you have questions. COMMON BRAND NAME(S): Prolia, XGEVA What should I tell my care team before I take this medication? They need to know if you have any of these conditions: dental disease having surgery or tooth extraction infection kidney disease low levels of calcium or Vitamin D in the blood malnutrition on hemodialysis skin conditions or sensitivity thyroid or parathyroid disease an unusual reaction to denosumab, other medicines, foods, dyes, or preservatives pregnant or trying to get pregnant breast-feeding How should I use this medication? This medicine is for injection under the skin. It is given by a health careprofessional in a hospital or clinic setting. A special MedGuide will be given to you before each treatment. Be sure to readthis information carefully each time. For Prolia, talk to your pediatrician regarding the use of this medicine in children. Special care may be needed. For Delton See, talk to your pediatrician regarding the use of this medicine in children. While this drug may be prescribed for children as young as 13 years for selected conditions,precautions do apply. Overdosage: If you think you have taken too much of this medicine contact apoison control center or emergency room at once. NOTE: This medicine is only for you. Do not share this medicine with others. What if I miss a dose? It is important not to miss your dose. Call  your doctor or health careprofessional if you are unable to keep an appointment. What may interact with this medication? Do not take this medicine with any of the following medications: other medicines containing denosumab This medicine may also interact with the following medications: medicines that lower your chance of fighting infection steroid medicines like prednisone or cortisone This list may not describe all possible interactions. Give your health care provider a list of all the medicines, herbs, non-prescription drugs, or dietary supplements you use. Also tell them if you smoke, drink alcohol, or use illegaldrugs. Some items may interact with your medicine. What should I watch for while using this medication? Visit your doctor or health care professional for regular checks on your progress. Your doctor or health care professional may order blood tests andother tests to see how you are doing. Call your doctor or health care professional for advice if you get a fever, chills or sore throat, or other symptoms of a cold or flu. Do not treat yourself. This drug may decrease your body's ability to fight infection. Try toavoid being around people who are sick. You should make sure you get enough calcium and vitamin D while you are taking this medicine, unless your doctor tells you not to. Discuss the foods you eatand the vitamins you take with your health care professional. See your dentist regularly. Brush and floss your teeth as directed. Before youhave any dental work done, tell your dentist you are receiving this medicine. Do not become pregnant while taking this medicine or for 5 months after stopping it. Talk with your doctor or health care professional about your  birth control options while taking this medicine. Women should inform their doctor if they wish to become pregnant or think they might be pregnant. There is a potential for serious side effects to an unborn child. Talk to your health  careprofessional or pharmacist for more information. What side effects may I notice from receiving this medication? Side effects that you should report to your doctor or health care professionalas soon as possible: allergic reactions like skin rash, itching or hives, swelling of the face, lips, or tongue bone pain breathing problems dizziness jaw pain, especially after dental work redness, blistering, peeling of the skin signs and symptoms of infection like fever or chills; cough; sore throat; pain or trouble passing urine signs of low calcium like fast heartbeat, muscle cramps or muscle pain; pain, tingling, numbness in the hands or feet; seizures unusual bleeding or bruising unusually weak or tired Side effects that usually do not require medical attention (report to yourdoctor or health care professional if they continue or are bothersome): constipation diarrhea headache joint pain loss of appetite muscle pain runny nose tiredness upset stomach This list may not describe all possible side effects. Call your doctor for medical advice about side effects. You may report side effects to FDA at1-800-FDA-1088. Where should I keep my medication? This medicine is only given in a clinic, doctor's office, or other health caresetting and will not be stored at home. NOTE: This sheet is a summary. It may not cover all possible information. If you have questions about this medicine, talk to your doctor, pharmacist, orhealth care provider.  2022 Elsevier/Gold Standard (2017-08-21 16:10:44)

## 2020-11-23 LAB — CANCER ANTIGEN 27.29: CA 27.29: 17.8 U/mL (ref 0.0–38.6)

## 2020-12-07 ENCOUNTER — Ambulatory Visit (HOSPITAL_COMMUNITY)
Admission: RE | Admit: 2020-12-07 | Discharge: 2020-12-07 | Disposition: A | Discharge status: Home / Self Care | Payer: BC Managed Care – PPO | Source: Ambulatory Visit | Attending: Oncology | Admitting: Oncology

## 2020-12-07 ENCOUNTER — Other Ambulatory Visit: Payer: Self-pay

## 2020-12-07 DIAGNOSIS — K769 Liver disease, unspecified: Secondary | ICD-10-CM | POA: Diagnosis not present

## 2020-12-07 DIAGNOSIS — Z9882 Breast implant status: Secondary | ICD-10-CM | POA: Diagnosis not present

## 2020-12-07 DIAGNOSIS — Z170 Estrogen receptor positive status [ER+]: Secondary | ICD-10-CM | POA: Insufficient documentation

## 2020-12-07 DIAGNOSIS — C787 Secondary malignant neoplasm of liver and intrahepatic bile duct: Secondary | ICD-10-CM | POA: Diagnosis not present

## 2020-12-07 DIAGNOSIS — Z9013 Acquired absence of bilateral breasts and nipples: Secondary | ICD-10-CM | POA: Diagnosis not present

## 2020-12-07 DIAGNOSIS — C50812 Malignant neoplasm of overlapping sites of left female breast: Secondary | ICD-10-CM | POA: Insufficient documentation

## 2020-12-07 DIAGNOSIS — K8020 Calculus of gallbladder without cholecystitis without obstruction: Secondary | ICD-10-CM | POA: Diagnosis not present

## 2020-12-07 DIAGNOSIS — C50919 Malignant neoplasm of unspecified site of unspecified female breast: Secondary | ICD-10-CM | POA: Diagnosis not present

## 2020-12-07 DIAGNOSIS — R599 Enlarged lymph nodes, unspecified: Secondary | ICD-10-CM | POA: Diagnosis not present

## 2020-12-07 MED ORDER — IOHEXOL 350 MG/ML SOLN
85.0000 mL | Freq: Once | INTRAVENOUS | Status: AC | PRN
  Administered 2020-12-07: 85 mL via INTRAVENOUS

## 2020-12-10 ENCOUNTER — Encounter: Payer: Self-pay | Admitting: Oncology

## 2020-12-16 NOTE — Progress Notes (Signed)
[place]  [phone] Fax:(336) [phone]      ID: [name] OB: 09/14/1975  MR#: [id number]  XFG#:[id number]  Patient Care Team: Jana Hakim Virgie Dad, MD as PCP - General (Oncology) Josel Keo, Virgie Dad, MD as Consulting Physician (Oncology) Alphonsa Overall, MD as Consulting Physician (General Surgery) Gavin Pound, MD as Consulting Physician (Rheumatology) Katy Fitch, Darlina Guys, MD as Consulting Physician (Ophthalmology) Kyung Rudd, MD as Consulting Physician (Radiation Oncology) OTHER MD: Annette Stable [phone])  CHIEF COMPLAINT:  Stage IV breast cancer, estrogen receptor positive (s/p bilateral mastectomies)  CURRENT TREATMENT:  anastrozole, palbociclib, denosumab/Xgeva   INTERVAL HISTORY: [name] returns today for follow up of her stage IV breast cancer.  Since her last visit, she underwent restaging CT chest, abdomen, pelvis on 12/07/2020 showing: decreased size of hepatic and upper abdominal nodal metastatic disease, no new lesions; similar diffuse bony sclerosis.  She continues on anastrozole, which she started 05/01/2020.  She tolerates this with occasional hot flashes but no other significant side effects  She also continues on palbociclib.  Because of low counts we have dropped the dose to 75 mg daily.  Today she is on day 4 of the current cycle  We are following her CA 27-29.  This has normalized Lab Results  Component Value Date   CA2729 17.8 11/22/2020   CA2729 24.8 10/25/2020   CA2729 36.0 09/27/2020   CA2729 72.8 (H) 08/30/2020   CA2729 127.7 (H) 08/02/2020    REVIEW OF SYSTEMS: Leanndra is doing "great".  She tells me she has tweaked her diet and is avoiding red meat and sweets.  She is eating a lot of string beans seafood and salads.  She also loves popcorn.  Importantly last week she had a dizzy episode while at work.  She laid down for about 15 minutes and sat up and felt nauseated in addition to dizzy.  She went home  and lay down for a couple of hours and the problem resolved.  She could not tell me if the room was spinning or not.  She did not vomit.  She did not have a headache and she did not have visual changes.  She did see Dr. Carolynn Bell recently and she tells me he said exam was "okay".  A detailed review of systems today was otherwise stable   COVID 19 VACCINATION STATUS: Not vaccinated as of 05/24/2020   BREAST CANCER HISTORY:  From the original intake note:  [name] had bilateral diagnostic mammography [facility] in [place] 11/30/2009 showing a 1.5 cm mass at the 9:00 position of the left breast with some satellites. A biopsy of the mass in question [facility] showed ([id number]) and invasive lobular carcinoma, which was estrogen and progesterone receptor both 100% positive, both with strong staining intensity, but HER-2 negative at 1+. Bilateral breast MRI 12/13/2009 in [place] showed in the left breast an irregularly marginated mass measuring 3.2 cm. There were 4 or 5 separate satellite lesions laterally and superior to the primary.  Accordingly, after appropriate discussion, the patient underwent bilateral mastectomies with left sentinel lymph node sampling 01/02/2010. The right breast was benign. The left breast showed a 2.3 cm invasive lobular carcinoma, grade 3, with a total of 6 sentinel lymph nodes removed, all negative, although 2 showed isolated tumor cells by immunostaining. Margins were negative.  An Oncotype BX was sent, with a recurrence score of 22, predicting a risk of distant recurrence within 10 years with 14% of the patients only  systemic treatment was tamoxifen. The patient was treated with CMF chemotherapy between 02/22/2010 and 07/05/2010, receiving 7 cycles at which point the patient refused further chemotherapy though there have been no major toxicities at according to the oncology note from Dr. Brent General. The patient was then started on tamoxifen  08/09/2010. By her cancer took approximately 12-15 months then stopped because of hot flashes and insomnia problems.  More recently, the patient noted a change in her left axilla andunderwent bilateral breast MRI January to 2015 at [place]. The patient has bilateral submuscular silicone implants in place. The breast were unremarkable, but there were several lymph nodes with cortical thickening in the left axilla including a dominant 1 measuring 1.2 cm in the short axis. Ultrasonography of this area identified the lymph node in question and the patient underwent biopsy of the left axillary lymph node This showed ([id number]) near-total replacement of the lymph node by metastatic carcinoma. This was 96% estrogen receptor positive, and 84% progesterone receptor positive, both with strong staining intensity. The MIB-1 was 20%. HER-2 determination is pending.  The patient's subsequent history is as detailed below   PAST MEDICAL HISTORY: Past Medical History:  Diagnosis Date   Back muscle spasm    occasional tx with skelaxin   Cancer ([place])    breast ca   GERD (gastroesophageal reflux disease)    Malignant neoplasm of breast (female), unspecified site    Radiation 10/24/13-12/09/13   Left chestwall/supraclav./scar    PAST SURGICAL HISTORY: Past Surgical History:  Procedure Laterality Date   AXILLARY LYMPH NODE DISSECTION Left 06/16/2013   Procedure: LEFT AXILLARY NODE DISSECTION;  Surgeon: Shann Medal, MD;  Location: [facility];  Service: General;  Laterality: Left;   BILATERAL TOTAL MASTECTOMY WITH AXILLARY LYMPH NODE DISSECTION     BREAST IMPLANT REMOVAL Bilateral 03/18/2018   Procedure: REMOVAL BREAST IMPLANTS;  Surgeon: Wallace Going, DO;  Location: [facility];  Service: Plastics;  Laterality: Bilateral;   BREAST SURGERY     CAPSULECTOMY Bilateral 03/18/2018   Procedure: CAPSULECTOMY;  Surgeon: Wallace Going, DO;  Location: [facility];   Service: Plastics;  Laterality: Bilateral;   LAPAROSCOPIC BILATERAL SALPINGO OOPHERECTOMY Bilateral 01/25/2014   Procedure: LAPAROSCOPIC BILATERAL SALPINGO OOPHORECTOMY;  Surgeon: Eldred Manges, MD;  Location: [facility];  Service: Gynecology;  Laterality: Bilateral;   LEEP     PLACEMENT OF BREAST IMPLANTS Bilateral 03/18/2018   Procedure: PLACEMENT OF BREAST IMPLANTS;  Surgeon: Wallace Going, DO;  Location: [place];  Service: Plastics;  Laterality: Bilateral;   PORT-A-CATH REMOVAL N/A 01/25/2014   Procedure: REMOVAL PORT-A-CATH;  Surgeon: Alphonsa Overall, MD;  Location: [facility];  Service: General;  Laterality: N/A;   PORTACATH PLACEMENT N/A 06/16/2013   Procedure: POWER PORT PLACEMENT;  Surgeon: Shann Medal, MD;  Location: [facility];  Service: General;  Laterality: N/A;   right leg surgery     broken femur - has rod in leg   TONSILLECTOMY     WISDOM TOOTH EXTRACTION      FAMILY HISTORY Family History  Problem Relation Age of Onset   Other Mother 61       glioblastoma; deceased 62   Cancer Mother    Other Father 55       glioblastoma; deceased 5   Cancer Father    Hypertension Other    according to the patient both her parents died from a primary brain cancers, namely we have the stomas, her father  at 6, her mother at 63. The patient had one brother and one sister. There is no history of breast or ovarian cancer in the family   GYNECOLOGIC HISTORY:   (Reviewed 10/10/2013) Menarche age 28. The patient has never carried a child to term. She was still having regular periods at the time of her recent diagnosis. LMP 07/18/2013 as of 09/08/2013.  The patient took oral contraceptives between the ages of 79 and 55 with no complications. She stopped having periods with her chemotherapy in 2015.    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 She  currently works from [employer] for [employer]Marland Kitchen  Her husband Burnice Logan works at [employer] and also in [place]: In the absence of any documents to the contrary the patient's husband is her healthcare power of attorney   HEALTH MAINTENANCE:  Social History   Tobacco Use   Smoking status: Never   Smokeless tobacco: Never  Substance Use Topics   Alcohol use: Yes    Comment: socially wine   Drug use: No     Colonoscopy: 08/2020 (Dr. Collene Mares), recall not indicated  PAP: December 2014  Bone density: Never  Lipid panel: Not on file   Allergies  Allergen Reactions   Sulfa Antibiotics Hives and Itching   Gadolinium Derivatives Hives    After gado injection, pt had a few small hives on left breast area. Treated with benadryl by dr Janeece Fitting    Petrolatum Hives and Rash    Current Outpatient Medications  Medication Sig Dispense Refill   anastrozole (ARIMIDEX) 1 MG tablet Take 1 tablet (1 mg total) by mouth daily. 90 tablet 4   palbociclib (IBRANCE) 75 MG tablet Take 1 tablet (75 mg total) by mouth daily. Take for 21 days on, 7 days off, repeat every 28 days. 21 tablet 6   No current facility-administered medications for this visit.    OBJECTIVE: African-American woman who appears stated age  45:   12/17/20 1600  BP: 129/71  Pulse: 89  Resp: 18  Temp: (!) 97.3 F (36.3 C)  SpO2: 100%   Wt Readings from Last 3 Encounters:  12/17/20 214 lb 12.8 oz (97.4 kg)  10/25/20 204 lb 6.4 oz (92.7 kg)  09/27/20 202 lb 12.8 oz (92 kg)   Body mass index is 32.66 kg/m.    ECOG FS:1 - Symptomatic but completely ambulatory  Sclerae unicteric, EOMs intact Wearing a mask No cervical or supraclavicular adenopathy Lungs no rales or rhonchi Heart regular rate and rhythm Abd soft, nontender, positive bowel sounds MSK no focal spinal tenderness, no upper extremity lymphedema Neuro: nonfocal, well oriented, appropriate  affect Breasts: Deferred   LAB RESULTS: Lab Results  Component Value Date   WBC 3.1 (L) 12/17/2020   NEUTROABS 1.2 (L) 12/17/2020   HGB 10.7 (L) 12/17/2020   HCT 33.2 (L) 12/17/2020   MCV 91.7 12/17/2020   PLT 225 12/17/2020      Chemistry      Component Value Date/Time   NA 143 12/17/2020 1450   NA 141 10/30/2016 0813   K 3.7 12/17/2020 1450   K 4.0 10/30/2016 0813   CL 110 12/17/2020 1450   CO2 24 12/17/2020 1450   CO2 26 10/30/2016 0813   BUN 10 12/17/2020 1450   BUN 15.2 10/30/2016 0813  CREATININE 0.86 12/17/2020 1450   CREATININE 0.8 10/30/2016 0813      Component Value Date/Time   CALCIUM 9.1 12/17/2020 1450   CALCIUM 10.0 10/30/2016 0813   ALKPHOS 96 12/17/2020 1450   ALKPHOS 59 10/30/2016 0813   AST 17 12/17/2020 1450   AST 17 10/30/2016 0813   ALT 14 12/17/2020 1450   ALT 11 10/30/2016 0813   BILITOT 0.3 12/17/2020 1450   BILITOT 0.26 10/30/2016 0813      STUDIES: CT Chest W Contrast  Result Date: 12/08/2020 CLINICAL DATA:  Breast cancer, assess treatment response in a 45 year old female. EXAM: CT CHEST, ABDOMEN, AND PELVIS WITH CONTRAST TECHNIQUE: Multidetector CT imaging of the chest, abdomen and pelvis was performed following the standard protocol during bolus administration of intravenous contrast. CONTRAST:  41m OMNIPAQUE IOHEXOL 350 MG/ML SOLN COMPARISON:  Comparison made with Aug 29, 2020. FINDINGS: CT CHEST FINDINGS Cardiovascular: Unremarkable appearance of cardiovascular structures. Mediastinum/Nodes: Small RIGHT axillary lymph node (image 13/2) 10 mm short axis previously 11 mm. Decreased conspicuity of other small lymph nodes scattered about the RIGHT axilla and retropectoral region. High RIGHT supraclavicular/posterior RIGHT neck lymph node 10 mm (image 1/2) incompletely imaged not imaged on the previous study. No mediastinal or hilar lymphadenopathy. The post LEFT axillary dissection. Mildly patulous esophagus. RIGHT infrahilar nodal tissue in  LEFT periaortic/paraesophageal lymph nodes less than 5 mm. Lungs/Pleura: Airways are patent.  Lungs are clear. Musculoskeletal: See below for full musculoskeletal details. CT ABDOMEN PELVIS FINDINGS Hepatobiliary: RIGHT hepatic lobe lesion (image 46/2) 3.2 x 2.5 cm previously approximately 5.6 x 3.8 cm (image 46/2) No new hepatic lesion. The portal vein is patent. Hepatic veins are patent. Cholelithiasis.  No biliary duct dilation. Pancreas: Normal, without mass, inflammation or ductal dilatation. Spleen: Spleen normal size and contour. Adrenals/Urinary Tract: Adrenal glands are normal. Symmetric renal enhancement. Smooth contour of the urinary bladder. No suspicious renal lesion or hydronephrosis. Stomach/Bowel: No acute gastrointestinal process. Appendix is normal. Vascular/Lymphatic: Normal caliber of the IVC and aorta with smooth contour. Periportal adenopathy markedly diminished in size difficult to determine whether 1 of these areas represent a caudate lesion or a large periportal lymph node on previous imaging studies refer to is a periportal lymph node on the prior exam (image 53/2) 10 mm short axis, previously approximately 23-25 mm short axis. Celiac lymph node adjacent to pancreatic head (image 55/2) 12 mm, previously 20 mm short axis. No retroperitoneal or mesenteric lymphadenopathy. No pelvic sidewall lymphadenopathy. Reproductive: Ovoid area along the LEFT lateral uterus/LEFT adnexa 2.5 cm unchanged compared to previous imaging. No adnexal masses. Other: No ascites Musculoskeletal: Diffuse bony sclerosis compatible with widespread osseous metastatic disease is similar to previous imaging. Post intramedullary nailing of the LEFT femur. Incompletely visualized postoperative changes. Post bilateral mastectomy, breast implants in place. IMPRESSION: 1. Decreased size of hepatic and upper abdominal nodal metastatic disease. No new lesions. 2. Diffuse bony sclerosis compatible with widespread osseous  metastatic disease is similar to previous imaging. 3. Cholelithiasis. Electronically Signed   By: GZetta BillsM.D.   On: 12/08/2020 13:30   CT Abdomen Pelvis W Contrast  Result Date: 12/08/2020 CLINICAL DATA:  Breast cancer, assess treatment response in a 45year old female. EXAM: CT CHEST, ABDOMEN, AND PELVIS WITH CONTRAST TECHNIQUE: Multidetector CT imaging of the chest, abdomen and pelvis was performed following the standard protocol during bolus administration of intravenous contrast. CONTRAST:  843mOMNIPAQUE IOHEXOL 350 MG/ML SOLN COMPARISON:  Comparison made with Aug 29, 2020. FINDINGS: CT CHEST FINDINGS Cardiovascular: Unremarkable  appearance of cardiovascular structures. Mediastinum/Nodes: Small RIGHT axillary lymph node (image 13/2) 10 mm short axis previously 11 mm. Decreased conspicuity of other small lymph nodes scattered about the RIGHT axilla and retropectoral region. High RIGHT supraclavicular/posterior RIGHT neck lymph node 10 mm (image 1/2) incompletely imaged not imaged on the previous study. No mediastinal or hilar lymphadenopathy. The post LEFT axillary dissection. Mildly patulous esophagus. RIGHT infrahilar nodal tissue in LEFT periaortic/paraesophageal lymph nodes less than 5 mm. Lungs/Pleura: Airways are patent.  Lungs are clear. Musculoskeletal: See below for full musculoskeletal details. CT ABDOMEN PELVIS FINDINGS Hepatobiliary: RIGHT hepatic lobe lesion (image 46/2) 3.2 x 2.5 cm previously approximately 5.6 x 3.8 cm (image 46/2) No new hepatic lesion. The portal vein is patent. Hepatic veins are patent. Cholelithiasis.  No biliary duct dilation. Pancreas: Normal, without mass, inflammation or ductal dilatation. Spleen: Spleen normal size and contour. Adrenals/Urinary Tract: Adrenal glands are normal. Symmetric renal enhancement. Smooth contour of the urinary bladder. No suspicious renal lesion or hydronephrosis. Stomach/Bowel: No acute gastrointestinal process. Appendix is normal.  Vascular/Lymphatic: Normal caliber of the IVC and aorta with smooth contour. Periportal adenopathy markedly diminished in size difficult to determine whether 1 of these areas represent a caudate lesion or a large periportal lymph node on previous imaging studies refer to is a periportal lymph node on the prior exam (image 53/2) 10 mm short axis, previously approximately 23-25 mm short axis. Celiac lymph node adjacent to pancreatic head (image 55/2) 12 mm, previously 20 mm short axis. No retroperitoneal or mesenteric lymphadenopathy. No pelvic sidewall lymphadenopathy. Reproductive: Ovoid area along the LEFT lateral uterus/LEFT adnexa 2.5 cm unchanged compared to previous imaging. No adnexal masses. Other: No ascites Musculoskeletal: Diffuse bony sclerosis compatible with widespread osseous metastatic disease is similar to previous imaging. Post intramedullary nailing of the LEFT femur. Incompletely visualized postoperative changes. Post bilateral mastectomy, breast implants in place. IMPRESSION: 1. Decreased size of hepatic and upper abdominal nodal metastatic disease. No new lesions. 2. Diffuse bony sclerosis compatible with widespread osseous metastatic disease is similar to previous imaging. 3. Cholelithiasis. Electronically Signed   By: Zetta Bills M.D.   On: 12/08/2020 13:30      ASSESSMENT: 45 y.o. BRCA negative [place], [place] woman  (1) status post bilateral mastectomies with left axillary lymph node sampling [6 nodes removed] 01/02/2010 for a pT2 pN0(i+), stage IIA invasive lobular breast cancer, grade 3, estrogen and progesterone receptor positive, HER-2 not amplified  (a) right breast was benign  (2) Oncotype DX score of 22 predicted a 14% risk of distant recurrence within 10 years if the patient's only systemic treatment is tamoxifen for 5 years  (3) status post CMF(cyclophosphamide, fluorouracil, methotrexate) x7 given between October of 2011 and March of 2012 (7 of 8 planned treatments  completed)  (4) tamoxifen started April 2012, discontinued approximately July of 2013 (about 15 months) because of problems with hot flashes and insomnia  (5) pathologically documented left axillary recurrence 05/05/2013, the tumor being again estrogen and progesterone receptor positive, with HER-2 not amplified, and MIB-1 of 15%. Staging CT/PET 05/27/2013 show left axillary and supraclavicular recurrence, no distant disease  (6) status post completion left axillary lymph node dissection 06/16/2013 showing a total of 10/15 lymph nodes involved by tumor, with evidence of extracapsular extension (TX N3 = stage IIIC)  (7) treated with carboplatin/ docetaxel, first dose on 07/11/2013, repeated every 21 days x 4 with Neulasta support on day 2.  Final dose was given on 09/12/2013, with chemotherapy discontinued after 4 cycles  due to continuing low counts.  (8) radiation completed 12/09/2013: Left chest wall / 50.4 Gray @ 1.8 Gray per fraction x 28 fractions Left Supraclavicular fossa and PAB/ 45 Gray @1 .8 Gray per fraction x 25 fractions Left scar / 10 Gray at [employer] per fraction x 5 fractions   (9) bilateral salpingo-oophorectomy on 01/25/14  (10)   lymphedema and limited range of motion in the left upper extremity secondary to left axillary lymph node dissection, being treated through the lymphedema clinic  - improved   (13) sequencing and deletion/duplication analysis of 17 genes performed September 2015 at [facility] showed no deleterious mutations in  ATM, BARD1, BRCA1, BRCA2, BRIP1, CDH1, CHEK2, MRE11A, MUTYH, NBN, NF1, PALB2, PTEN, RAD50, RAD51C, RAD51D, and TP53.  (14) tamoxifen re-started 02/27/2014, discontinued December 2021 with progression  (15) bilateral breast implant exchange with capsulotomies 03/18/2018 following left implant rupture and bilateral capsular contractures. Left - [employer] Smooth Round Ultra High Profile Gel 590cc. Ref #[id number].  Serial Number [id number] Right -  [employer] Smooth Round Ultra High Profile Gel 590cc. Ref #[id number].  Serial Number [id number]  METASTATIC DISEASE: December 2021 (16) bone marrow biopsy 04/23/2020 shows metastatic carcinoma, estrogen and progesterone receptor positive  (a) MRI of the cervical spine 04/07/2020 shows markedly abnormal marrow signal, prominent right axillary lymph nodes, degenerative disc disease  (b) MRI of the orbits 04/13/2020 suggests metastatic disease around the left orbit and diffusely abnormal bone marrow signal  (c) CT scan of the chest, abdomen and pelvis 05/10/2020 shows extensive adenopathy (right axillary, prevascular, right hilar, celiac and portacaval axes), multiple liver masses, multiple sclerotic bone lesions  (d) bone scan 05/10/2020 shows uptake through the pelvis spine sternum and ribs and possibly the right mandible.  Consistent with widespread bony metastatic disease  (e) brain MRI 05/09/2020 shows no evidence of intracranial metastases, with left periorbital disease as previously documented  (f) CA 27-29 is informative (was 155.5 05/11/2020)  (17) radiation to the left periorbital region completed 05/31/2020  (a) repeat brain MRI 10/20/2020 shows no evidence of intracranial metastases.  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  (19) denosumab/ Xgeva started 07/05/2020, repeated every 28 d   PLAN: Athina is now a little over half year out from definitive diagnosis of metastatic breast cancer.  She was just restaged.  Her measurable disease is improved and controlled.  She is tolerating the current  treatment well.  Her ANC today is 1.2.  She understands if it drops below 1.0 consistently we will have to drop the palbociclib dose further.  I commended her diet and her activities.  I do not have a simple explanation for the transient episode of nausea and dizziness she had 3 days ago.  She could have had vertigo.  If it recurs we will obtain MRI of the orbits and brain otherwise we will get those tests the second week in October.  We are continuing denosumab/Xgeva every 4 weeks.  She is tolerating that with no side effects.  She will see me 8 weeks from now.  Total encounter time 35 minutes.*   Carrie Usery, Virgie Dad,  MD  12/17/20 4:15 PM Medical Oncology and Hematology [facility] [place] [address] Tel. [phone]    Fax. [phone]   I, Wilburn Mylar, am acting as scribe for Dr. Virgie Dad. Russell Quinney.  I, Lurline Del MD, have reviewed the above documentation for accuracy and completeness, and I agree with the above.   *Total Encounter Time as defined by the Centers for Medicare and Medicaid Services includes, in addition to the face-to-face time of a patient visit (documented in the note above) non-face-to-face time: obtaining and reviewing outside history, ordering and reviewing medications, tests or procedures, care coordination (communications with other health care professionals or caregivers) and documentation in the medical record.

## 2020-12-17 ENCOUNTER — Inpatient Hospital Stay: Payer: BC Managed Care – PPO | Attending: Oncology | Admitting: Oncology

## 2020-12-17 ENCOUNTER — Inpatient Hospital Stay: Payer: BC Managed Care – PPO

## 2020-12-17 ENCOUNTER — Other Ambulatory Visit: Payer: Self-pay

## 2020-12-17 VITALS — BP 151/78 | HR 95 | Temp 99.2°F | Resp 18

## 2020-12-17 VITALS — BP 129/71 | HR 89 | Temp 97.3°F | Resp 18 | Ht 68.0 in | Wt 214.8 lb

## 2020-12-17 DIAGNOSIS — C50911 Malignant neoplasm of unspecified site of right female breast: Secondary | ICD-10-CM | POA: Diagnosis not present

## 2020-12-17 DIAGNOSIS — Z170 Estrogen receptor positive status [ER+]: Secondary | ICD-10-CM

## 2020-12-17 DIAGNOSIS — C50812 Malignant neoplasm of overlapping sites of left female breast: Secondary | ICD-10-CM | POA: Insufficient documentation

## 2020-12-17 DIAGNOSIS — C7951 Secondary malignant neoplasm of bone: Secondary | ICD-10-CM | POA: Diagnosis not present

## 2020-12-17 DIAGNOSIS — C771 Secondary and unspecified malignant neoplasm of intrathoracic lymph nodes: Secondary | ICD-10-CM

## 2020-12-17 DIAGNOSIS — C50919 Malignant neoplasm of unspecified site of unspecified female breast: Secondary | ICD-10-CM

## 2020-12-17 DIAGNOSIS — Z79899 Other long term (current) drug therapy: Secondary | ICD-10-CM | POA: Insufficient documentation

## 2020-12-17 DIAGNOSIS — C787 Secondary malignant neoplasm of liver and intrahepatic bile duct: Secondary | ICD-10-CM

## 2020-12-17 DIAGNOSIS — C50912 Malignant neoplasm of unspecified site of left female breast: Secondary | ICD-10-CM

## 2020-12-17 LAB — CBC WITH DIFFERENTIAL (CANCER CENTER ONLY)
Abs Immature Granulocytes: 0 10*3/uL (ref 0.00–0.07)
Basophils Absolute: 0 10*3/uL (ref 0.0–0.1)
Basophils Relative: 1 %
Eosinophils Absolute: 0 10*3/uL (ref 0.0–0.5)
Eosinophils Relative: 1 %
HCT: 33.2 % — ABNORMAL LOW (ref 36.0–46.0)
Hemoglobin: 10.7 g/dL — ABNORMAL LOW (ref 12.0–15.0)
Immature Granulocytes: 0 %
Lymphocytes Relative: 55 %
Lymphs Abs: 1.7 10*3/uL (ref 0.7–4.0)
MCH: 29.6 pg (ref 26.0–34.0)
MCHC: 32.2 g/dL (ref 30.0–36.0)
MCV: 91.7 fL (ref 80.0–100.0)
Monocytes Absolute: 0.2 10*3/uL (ref 0.1–1.0)
Monocytes Relative: 6 %
Neutro Abs: 1.2 10*3/uL — ABNORMAL LOW (ref 1.7–7.7)
Neutrophils Relative %: 37 %
Platelet Count: 225 10*3/uL (ref 150–400)
RBC: 3.62 MIL/uL — ABNORMAL LOW (ref 3.87–5.11)
RDW: 15.9 % — ABNORMAL HIGH (ref 11.5–15.5)
WBC Count: 3.1 10*3/uL — ABNORMAL LOW (ref 4.0–10.5)
nRBC: 0 % (ref 0.0–0.2)

## 2020-12-17 LAB — CMP (CANCER CENTER ONLY)
ALT: 14 U/L (ref 0–44)
AST: 17 U/L (ref 15–41)
Albumin: 4.1 g/dL (ref 3.5–5.0)
Alkaline Phosphatase: 96 U/L (ref 38–126)
Anion gap: 9 (ref 5–15)
BUN: 10 mg/dL (ref 6–20)
CO2: 24 mmol/L (ref 22–32)
Calcium: 9.1 mg/dL (ref 8.9–10.3)
Chloride: 110 mmol/L (ref 98–111)
Creatinine: 0.86 mg/dL (ref 0.44–1.00)
GFR, Estimated: >60 mL/min (ref >60)
Glucose, Bld: 123 mg/dL — ABNORMAL HIGH (ref 70–99)
Potassium: 3.7 mmol/L (ref 3.5–5.1)
Sodium: 143 mmol/L (ref 135–145)
Total Bilirubin: 0.3 mg/dL (ref 0.3–1.2)
Total Protein: 7.8 g/dL (ref 6.5–8.1)

## 2020-12-17 LAB — SAMPLE TO BLOOD BANK

## 2020-12-17 MED ORDER — DENOSUMAB 120 MG/1.7ML ~~LOC~~ SOLN
120.0000 mg | Freq: Once | SUBCUTANEOUS | Status: AC
  Administered 2020-12-17: 120 mg via SUBCUTANEOUS
  Filled 2020-12-17: qty 1.7

## 2020-12-17 NOTE — Patient Instructions (Signed)
Denosumab injection What is this medication? DENOSUMAB (den oh sue mab) slows bone breakdown. Prolia is used to treat osteoporosis in women after menopause and in men, and in people who are taking corticosteroids for 6 months or more. Delton See is used to treat a high calcium level due to cancer and to prevent bone fractures and other bone problems caused by multiple myeloma or cancer bone metastases. Delton See is also used totreat giant cell tumor of the bone. This medicine may be used for other purposes; ask your health care provider orpharmacist if you have questions. COMMON BRAND NAME(S): Prolia, XGEVA What should I tell my care team before I take this medication? They need to know if you have any of these conditions: dental disease having surgery or tooth extraction infection kidney disease low levels of calcium or Vitamin D in the blood malnutrition on hemodialysis skin conditions or sensitivity thyroid or parathyroid disease an unusual reaction to denosumab, other medicines, foods, dyes, or preservatives pregnant or trying to get pregnant breast-feeding How should I use this medication? This medicine is for injection under the skin. It is given by a health careprofessional in a hospital or clinic setting. A special MedGuide will be given to you before each treatment. Be sure to readthis information carefully each time. For Prolia, talk to your pediatrician regarding the use of this medicine in children. Special care may be needed. For Delton See, talk to your pediatrician regarding the use of this medicine in children. While this drug may be prescribed for children as young as 13 years for selected conditions,precautions do apply. Overdosage: If you think you have taken too much of this medicine contact apoison control center or emergency room at once. NOTE: This medicine is only for you. Do not share this medicine with others. What if I miss a dose? It is important not to miss your dose. Call  your doctor or health careprofessional if you are unable to keep an appointment. What may interact with this medication? Do not take this medicine with any of the following medications: other medicines containing denosumab This medicine may also interact with the following medications: medicines that lower your chance of fighting infection steroid medicines like prednisone or cortisone This list may not describe all possible interactions. Give your health care provider a list of all the medicines, herbs, non-prescription drugs, or dietary supplements you use. Also tell them if you smoke, drink alcohol, or use illegaldrugs. Some items may interact with your medicine. What should I watch for while using this medication? Visit your doctor or health care professional for regular checks on your progress. Your doctor or health care professional may order blood tests andother tests to see how you are doing. Call your doctor or health care professional for advice if you get a fever, chills or sore throat, or other symptoms of a cold or flu. Do not treat yourself. This drug may decrease your body's ability to fight infection. Try toavoid being around people who are sick. You should make sure you get enough calcium and vitamin D while you are taking this medicine, unless your doctor tells you not to. Discuss the foods you eatand the vitamins you take with your health care professional. See your dentist regularly. Brush and floss your teeth as directed. Before youhave any dental work done, tell your dentist you are receiving this medicine. Do not become pregnant while taking this medicine or for 5 months after stopping it. Talk with your doctor or health care professional about your  birth control options while taking this medicine. Women should inform their doctor if they wish to become pregnant or think they might be pregnant. There is a potential for serious side effects to an unborn child. Talk to your health  careprofessional or pharmacist for more information. What side effects may I notice from receiving this medication? Side effects that you should report to your doctor or health care professionalas soon as possible: allergic reactions like skin rash, itching or hives, swelling of the face, lips, or tongue bone pain breathing problems dizziness jaw pain, especially after dental work redness, blistering, peeling of the skin signs and symptoms of infection like fever or chills; cough; sore throat; pain or trouble passing urine signs of low calcium like fast heartbeat, muscle cramps or muscle pain; pain, tingling, numbness in the hands or feet; seizures unusual bleeding or bruising unusually weak or tired Side effects that usually do not require medical attention (report to yourdoctor or health care professional if they continue or are bothersome): constipation diarrhea headache joint pain loss of appetite muscle pain runny nose tiredness upset stomach This list may not describe all possible side effects. Call your doctor for medical advice about side effects. You may report side effects to FDA at1-800-FDA-1088. Where should I keep my medication? This medicine is only given in a clinic, doctor's office, or other health caresetting and will not be stored at home. NOTE: This sheet is a summary. It may not cover all possible information. If you have questions about this medicine, talk to your doctor, pharmacist, orhealth care provider.  2022 Elsevier/Gold Standard (2017-08-21 16:10:44)

## 2020-12-18 ENCOUNTER — Inpatient Hospital Stay: Payer: BC Managed Care – PPO

## 2020-12-18 ENCOUNTER — Inpatient Hospital Stay: Payer: BC Managed Care – PPO | Admitting: Oncology

## 2020-12-18 LAB — CANCER ANTIGEN 27.29: CA 27.29: 19.2 U/mL (ref 0.0–38.6)

## 2020-12-21 ENCOUNTER — Ambulatory Visit: Payer: BC Managed Care – PPO | Admitting: Oncology

## 2021-01-18 ENCOUNTER — Inpatient Hospital Stay: Payer: BC Managed Care – PPO

## 2021-01-18 ENCOUNTER — Inpatient Hospital Stay: Payer: BC Managed Care – PPO | Attending: Oncology

## 2021-01-18 ENCOUNTER — Other Ambulatory Visit: Payer: Self-pay

## 2021-01-18 VITALS — BP 140/92 | HR 88 | Temp 98.6°F | Resp 18

## 2021-01-18 DIAGNOSIS — C50912 Malignant neoplasm of unspecified site of left female breast: Secondary | ICD-10-CM

## 2021-01-18 DIAGNOSIS — C50812 Malignant neoplasm of overlapping sites of left female breast: Secondary | ICD-10-CM | POA: Insufficient documentation

## 2021-01-18 DIAGNOSIS — C7951 Secondary malignant neoplasm of bone: Secondary | ICD-10-CM

## 2021-01-18 DIAGNOSIS — C50919 Malignant neoplasm of unspecified site of unspecified female breast: Secondary | ICD-10-CM

## 2021-01-18 DIAGNOSIS — C773 Secondary and unspecified malignant neoplasm of axilla and upper limb lymph nodes: Secondary | ICD-10-CM

## 2021-01-18 LAB — CMP (CANCER CENTER ONLY)
ALT: 11 U/L (ref 0–44)
AST: 17 U/L (ref 15–41)
Albumin: 4.3 g/dL (ref 3.5–5.0)
Alkaline Phosphatase: 93 U/L (ref 38–126)
Anion gap: 9 (ref 5–15)
BUN: 15 mg/dL (ref 6–20)
CO2: 26 mmol/L (ref 22–32)
Calcium: 9.9 mg/dL (ref 8.9–10.3)
Chloride: 106 mmol/L (ref 98–111)
Creatinine: 1.01 mg/dL — ABNORMAL HIGH (ref 0.44–1.00)
GFR, Estimated: >60 mL/min (ref >60)
Glucose, Bld: 100 mg/dL — ABNORMAL HIGH (ref 70–99)
Potassium: 3.9 mmol/L (ref 3.5–5.1)
Sodium: 141 mmol/L (ref 135–145)
Total Bilirubin: 0.3 mg/dL (ref 0.3–1.2)
Total Protein: 8.1 g/dL (ref 6.5–8.1)

## 2021-01-18 LAB — CBC WITH DIFFERENTIAL (CANCER CENTER ONLY)
Abs Immature Granulocytes: 0.01 10*3/uL (ref 0.00–0.07)
Basophils Absolute: 0 10*3/uL (ref 0.0–0.1)
Basophils Relative: 1 %
Eosinophils Absolute: 0 10*3/uL (ref 0.0–0.5)
Eosinophils Relative: 1 %
HCT: 34.2 % — ABNORMAL LOW (ref 36.0–46.0)
Hemoglobin: 11 g/dL — ABNORMAL LOW (ref 12.0–15.0)
Immature Granulocytes: 0 %
Lymphocytes Relative: 46 %
Lymphs Abs: 1.4 10*3/uL (ref 0.7–4.0)
MCH: 29.3 pg (ref 26.0–34.0)
MCHC: 32.2 g/dL (ref 30.0–36.0)
MCV: 91.2 fL (ref 80.0–100.0)
Monocytes Absolute: 0.1 10*3/uL (ref 0.1–1.0)
Monocytes Relative: 4 %
Neutro Abs: 1.5 10*3/uL — ABNORMAL LOW (ref 1.7–7.7)
Neutrophils Relative %: 48 %
Platelet Count: 250 10*3/uL (ref 150–400)
RBC: 3.75 MIL/uL — ABNORMAL LOW (ref 3.87–5.11)
RDW: 15.2 % (ref 11.5–15.5)
WBC Count: 3.1 10*3/uL — ABNORMAL LOW (ref 4.0–10.5)
nRBC: 0 % (ref 0.0–0.2)

## 2021-01-18 LAB — SAMPLE TO BLOOD BANK

## 2021-01-18 MED ORDER — DENOSUMAB 120 MG/1.7ML ~~LOC~~ SOLN
120.0000 mg | Freq: Once | SUBCUTANEOUS | Status: AC
  Administered 2021-01-18: 120 mg via SUBCUTANEOUS
  Filled 2021-01-18: qty 1.7

## 2021-01-18 NOTE — Patient Instructions (Signed)
Denosumab injection What is this medication? DENOSUMAB (den oh sue mab) slows bone breakdown. Prolia is used to treat osteoporosis in women after menopause and in men, and in people who are taking corticosteroids for 6 months or more. Xgeva is used to treat a high calcium level due to cancer and to prevent bone fractures and other bone problems caused by multiple myeloma or cancer bone metastases. Xgeva is also used to treat giant cell tumor of the bone. This medicine may be used for other purposes; ask your health care provider or pharmacist if you have questions. COMMON BRAND NAME(S): Prolia, XGEVA What should I tell my care team before I take this medication? They need to know if you have any of these conditions: dental disease having surgery or tooth extraction infection kidney disease low levels of calcium or Vitamin D in the blood malnutrition on hemodialysis skin conditions or sensitivity thyroid or parathyroid disease an unusual reaction to denosumab, other medicines, foods, dyes, or preservatives pregnant or trying to get pregnant breast-feeding How should I use this medication? This medicine is for injection under the skin. It is given by a health care professional in a hospital or clinic setting. A special MedGuide will be given to you before each treatment. Be sure to read this information carefully each time. For Prolia, talk to your pediatrician regarding the use of this medicine in children. Special care may be needed. For Xgeva, talk to your pediatrician regarding the use of this medicine in children. While this drug may be prescribed for children as young as 13 years for selected conditions, precautions do apply. Overdosage: If you think you have taken too much of this medicine contact a poison control center or emergency room at once. NOTE: This medicine is only for you. Do not share this medicine with others. What if I miss a dose? It is important not to miss your dose.  Call your doctor or health care professional if you are unable to keep an appointment. What may interact with this medication? Do not take this medicine with any of the following medications: other medicines containing denosumab This medicine may also interact with the following medications: medicines that lower your chance of fighting infection steroid medicines like prednisone or cortisone This list may not describe all possible interactions. Give your health care provider a list of all the medicines, herbs, non-prescription drugs, or dietary supplements you use. Also tell them if you smoke, drink alcohol, or use illegal drugs. Some items may interact with your medicine. What should I watch for while using this medication? Visit your doctor or health care professional for regular checks on your progress. Your doctor or health care professional may order blood tests and other tests to see how you are doing. Call your doctor or health care professional for advice if you get a fever, chills or sore throat, or other symptoms of a cold or flu. Do not treat yourself. This drug may decrease your body's ability to fight infection. Try to avoid being around people who are sick. You should make sure you get enough calcium and vitamin D while you are taking this medicine, unless your doctor tells you not to. Discuss the foods you eat and the vitamins you take with your health care professional. See your dentist regularly. Brush and floss your teeth as directed. Before you have any dental work done, tell your dentist you are receiving this medicine. Do not become pregnant while taking this medicine or for 5 months after   stopping it. Talk with your doctor or health care professional about your birth control options while taking this medicine. Women should inform their doctor if they wish to become pregnant or think they might be pregnant. There is a potential for serious side effects to an unborn child. Talk to  your health care professional or pharmacist for more information. What side effects may I notice from receiving this medication? Side effects that you should report to your doctor or health care professional as soon as possible: allergic reactions like skin rash, itching or hives, swelling of the face, lips, or tongue bone pain breathing problems dizziness jaw pain, especially after dental work redness, blistering, peeling of the skin signs and symptoms of infection like fever or chills; cough; sore throat; pain or trouble passing urine signs of low calcium like fast heartbeat, muscle cramps or muscle pain; pain, tingling, numbness in the hands or feet; seizures unusual bleeding or bruising unusually weak or tired Side effects that usually do not require medical attention (report to your doctor or health care professional if they continue or are bothersome): constipation diarrhea headache joint pain loss of appetite muscle pain runny nose tiredness upset stomach This list may not describe all possible side effects. Call your doctor for medical advice about side effects. You may report side effects to FDA at 1-800-FDA-1088. Where should I keep my medication? This medicine is only given in a clinic, doctor's office, or other health care setting and will not be stored at home. NOTE: This sheet is a summary. It may not cover all possible information. If you have questions about this medicine, talk to your doctor, pharmacist, or health care provider.  2022 [employer] (2017-08-21 16:10:44)

## 2021-01-19 LAB — CANCER ANTIGEN 27.29: CA 27.29: 18.1 U/mL (ref 0.0–38.6)

## 2021-01-21 DIAGNOSIS — H4942 Progressive external ophthalmoplegia, left eye: Secondary | ICD-10-CM | POA: Diagnosis not present

## 2021-01-21 DIAGNOSIS — H04123 Dry eye syndrome of bilateral lacrimal glands: Secondary | ICD-10-CM | POA: Diagnosis not present

## 2021-01-21 DIAGNOSIS — H34812 Central retinal vein occlusion, left eye, with macular edema: Secondary | ICD-10-CM | POA: Diagnosis not present

## 2021-01-21 DIAGNOSIS — H5700 Unspecified anomaly of pupillary function: Secondary | ICD-10-CM | POA: Diagnosis not present

## 2021-01-24 ENCOUNTER — Encounter: Payer: Self-pay | Admitting: Oncology

## 2021-01-25 ENCOUNTER — Encounter: Payer: Self-pay | Admitting: Oncology

## 2021-01-25 ENCOUNTER — Other Ambulatory Visit: Payer: Self-pay | Admitting: *Deleted

## 2021-01-25 MED ORDER — LORAZEPAM 0.5 MG PO TABS: 0.5000 mg | ORAL_TABLET | Freq: Three times a day (TID) | ORAL | 0 refills | Status: DC

## 2021-01-26 ENCOUNTER — Other Ambulatory Visit: Payer: BC Managed Care – PPO

## 2021-01-27 ENCOUNTER — Encounter (HOSPITAL_COMMUNITY): Payer: Self-pay

## 2021-01-27 ENCOUNTER — Ambulatory Visit (HOSPITAL_COMMUNITY)
Admission: RE | Admit: 2021-01-27 | Discharge: 2021-01-27 | Disposition: A | Discharge status: Home / Self Care | Payer: BC Managed Care – PPO | Source: Ambulatory Visit | Attending: Oncology | Admitting: Oncology

## 2021-01-27 DIAGNOSIS — C50919 Malignant neoplasm of unspecified site of unspecified female breast: Secondary | ICD-10-CM | POA: Diagnosis not present

## 2021-01-27 DIAGNOSIS — C50911 Malignant neoplasm of unspecified site of right female breast: Secondary | ICD-10-CM

## 2021-01-27 DIAGNOSIS — C787 Secondary malignant neoplasm of liver and intrahepatic bile duct: Secondary | ICD-10-CM | POA: Insufficient documentation

## 2021-01-27 DIAGNOSIS — Z170 Estrogen receptor positive status [ER+]: Secondary | ICD-10-CM | POA: Insufficient documentation

## 2021-01-27 DIAGNOSIS — C50812 Malignant neoplasm of overlapping sites of left female breast: Secondary | ICD-10-CM | POA: Diagnosis not present

## 2021-01-27 DIAGNOSIS — C771 Secondary and unspecified malignant neoplasm of intrathoracic lymph nodes: Secondary | ICD-10-CM

## 2021-01-27 DIAGNOSIS — C7951 Secondary malignant neoplasm of bone: Secondary | ICD-10-CM | POA: Diagnosis not present

## 2021-01-27 MED ORDER — GADOBUTROL 1 MMOL/ML IV SOLN
9.5000 mL | Freq: Once | INTRAVENOUS | Status: AC | PRN
  Administered 2021-01-27: 9.5 mL via INTRAVENOUS

## 2021-01-27 NOTE — Progress Notes (Signed)
**  Pt began sneezing multiple times during and immediately after contrast injection. Dr. Pascal Lux notified. Pt stated she felt fine after administration of contrast was completed. Pt wanted to finish exam. Dr. Pascal Lux came to check on pt after exam completed. Pt again stated she felt fine. Pt verbalized understanding of trying Benadryl at home if symptoms arise or persist and/or coming back to hospital or ER if symptoms continue and get worse. No other symptoms/complaints at time of pt discharge from [facility] to home.

## 2021-01-28 ENCOUNTER — Encounter: Payer: Self-pay | Admitting: Adult Health

## 2021-01-28 ENCOUNTER — Encounter: Payer: Self-pay | Admitting: Oncology

## 2021-01-28 ENCOUNTER — Inpatient Hospital Stay: Payer: BC Managed Care – PPO | Admitting: Adult Health

## 2021-01-28 NOTE — Progress Notes (Deleted)
[place]  Telephone:(336) [phone] Fax:(336) [phone]      ID: [name] OB: [birth year]  MR#: [id number]  YOV#:[id number]  Patient Care Team: Jana Hakim Virgie Dad, MD as PCP - General (Oncology) Magrinat, Virgie Dad, MD as Consulting Physician (Oncology) Alphonsa Overall, MD as Consulting Physician (General Surgery) Gavin Pound, MD as Consulting Physician (Rheumatology) Katy Fitch, Darlina Guys, MD as Consulting Physician (Ophthalmology) Kyung Rudd, MD as Consulting Physician (Radiation Oncology) OTHER MD: Annette Stable [phone])  CHIEF COMPLAINT:  Stage IV breast cancer, estrogen receptor positive (s/p bilateral mastectomies)  CURRENT TREATMENT:  anastrozole, palbociclib, denosumab/Xgeva   INTERVAL HISTORY: Timberlee returns today for follow up of her stage IV breast cancer.  Her most recent imaging was completed on 01/27/2021 and showed that the infiltrative left orbital metastasis have diminished from initial scan in 03/2020 and is likely stable from brain MRI 09/2020, negative for brain metastases, and known osseous metastatic disease.  She continues on anastrozole, which she started 05/01/2020.  She tolerates this with occasional hot flashes but no other significant side effects  She also continues on palbociclib.      REVIEW OF SYSTEMS: Review of Systems  Constitutional:  Negative for appetite change, chills, fatigue, fever and unexpected weight change.  HENT:   Negative for hearing loss, lump/mass and trouble swallowing.   Eyes:  Negative for eye problems and icterus.  Respiratory:  Negative for chest tightness, cough and shortness of breath.   Cardiovascular:  Negative for chest pain, leg swelling and palpitations.  Gastrointestinal:  Negative for abdominal distention, abdominal pain, constipation, diarrhea, nausea and vomiting.  Endocrine: Negative for hot flashes.  Genitourinary:  Negative for difficulty urinating.   Musculoskeletal:  Negative  for arthralgias.  Skin:  Negative for itching and rash.  Neurological:  Negative for dizziness, extremity weakness, headaches and numbness.  Hematological:  Negative for adenopathy. Does not bruise/bleed easily.  Psychiatric/Behavioral:  Negative for depression. The patient is not nervous/anxious.       COVID 19 VACCINATION STATUS: Not vaccinated as of 05/24/2020   BREAST CANCER HISTORY:  From the original intake note:  Marli had bilateral diagnostic mammography [facility] in [place] 11/30/2009 showing a 1.5 cm mass at the 9:00 position of the left breast with some satellites. A biopsy of the mass in question [facility] showed ([id number]) and invasive lobular carcinoma, which was estrogen and progesterone receptor both 100% positive, both with strong staining intensity, but HER-2 negative at 1+. Bilateral breast MRI 12/13/2009 in [place] showed in the left breast an irregularly marginated mass measuring 3.2 cm. There were 4 or 5 separate satellite lesions laterally and superior to the primary.  Accordingly, after appropriate discussion, the patient underwent bilateral mastectomies with left sentinel lymph node sampling 01/02/2010. The right breast was benign. The left breast showed a 2.3 cm invasive lobular carcinoma, grade 3, with a total of 6 sentinel lymph nodes removed, all negative, although 2 showed isolated tumor cells by immunostaining. Margins were negative.  An Oncotype BX was sent, with a recurrence score of 22, predicting a risk of distant recurrence within 10 years with 14% of the patients only systemic treatment was tamoxifen. The patient was treated with CMF chemotherapy between 02/22/2010 and 07/05/2010, receiving 7 cycles at which point the patient refused further chemotherapy though there have been no major toxicities at according to the oncology note from Dr. Brent General. The patient was then started on tamoxifen 08/09/2010. By her cancer took  approximately 12-15 months  then stopped because of hot flashes and insomnia problems.  More recently, the patient noted a change in her left axilla andunderwent bilateral breast MRI January to 2015 at [place]. The patient has bilateral submuscular silicone implants in place. The breast were unremarkable, but there were several lymph nodes with cortical thickening in the left axilla including a dominant 1 measuring 1.2 cm in the short axis. Ultrasonography of this area identified the lymph node in question and the patient underwent biopsy of the left axillary lymph node This showed ([id number]) near-total replacement of the lymph node by metastatic carcinoma. This was 96% estrogen receptor positive, and 84% progesterone receptor positive, both with strong staining intensity. The MIB-1 was 20%. HER-2 determination is pending.  The patient's subsequent history is as detailed below   PAST MEDICAL HISTORY: Past Medical History:  Diagnosis Date   Back muscle spasm    occasional tx with skelaxin   GERD (gastroesophageal reflux disease)    Radiation 10/24/13-12/09/13   Left chestwall/supraclav./scar    PAST SURGICAL HISTORY: Past Surgical History:  Procedure Laterality Date   AXILLARY LYMPH NODE DISSECTION Left 06/16/2013   Procedure: LEFT AXILLARY NODE DISSECTION;  Surgeon: Shann Medal, MD;  Location: [facility];  Service: General;  Laterality: Left;   BILATERAL TOTAL MASTECTOMY WITH AXILLARY LYMPH NODE DISSECTION     BREAST IMPLANT REMOVAL Bilateral 03/18/2018   Procedure: REMOVAL BREAST IMPLANTS;  Surgeon: Wallace Going, DO;  Location: [facility];  Service: Plastics;  Laterality: Bilateral;   BREAST SURGERY     CAPSULECTOMY Bilateral 03/18/2018   Procedure: CAPSULECTOMY;  Surgeon: Wallace Going, DO;  Location: [facility];  Service: Plastics;  Laterality: Bilateral;   LAPAROSCOPIC BILATERAL SALPINGO OOPHERECTOMY Bilateral 01/25/2014    Procedure: LAPAROSCOPIC BILATERAL SALPINGO OOPHORECTOMY;  Surgeon: Eldred Manges, MD;  Location: [facility];  Service: Gynecology;  Laterality: Bilateral;   LEEP     PLACEMENT OF BREAST IMPLANTS Bilateral 03/18/2018   Procedure: PLACEMENT OF BREAST IMPLANTS;  Surgeon: Wallace Going, DO;  Location: [facility];  Service: Plastics;  Laterality: Bilateral;   PORT-A-CATH REMOVAL N/A 01/25/2014   Procedure: REMOVAL PORT-A-CATH;  Surgeon: Alphonsa Overall, MD;  Location: [facility];  Service: General;  Laterality: N/A;   PORTACATH PLACEMENT N/A 06/16/2013   Procedure: POWER PORT PLACEMENT;  Surgeon: Shann Medal, MD;  Location: [facility];  Service: General;  Laterality: N/A;   right leg surgery     broken femur - has rod in leg   TONSILLECTOMY     WISDOM TOOTH EXTRACTION      FAMILY HISTORY Family History  Problem Relation Age of Onset   Other Mother 41       glioblastoma; deceased 17   Cancer Mother    Other Father 67       glioblastoma; deceased 53   Cancer Father    Hypertension Other    according to the patient both her parents died from a primary brain cancers, namely we have the stomas, her father at 27, her mother at 77. The patient had one brother and one sister. There is no history of breast or ovarian cancer in the family   GYNECOLOGIC HISTORY:   (Reviewed 10/10/2013) Menarche age 39. The patient has never carried a child to term. She was still having regular periods at the time of her recent diagnosis. LMP 07/18/2013 as of 09/08/2013.  The patient took oral contraceptives between the ages of 40 and 52  with no complications. She stopped having periods with her chemotherapy in 2015.    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 She currently works from [employer] for [employer].Marland Kitchen  Her husband Burnice Logan works at  [employer] and also in [place]: In the absence of any documents to the contrary the patient's husband is her healthcare power of attorney   HEALTH MAINTENANCE:  Social History   Tobacco Use   Smoking status: Never   Smokeless tobacco: Never  Substance Use Topics   Alcohol use: Yes    Comment: socially wine   Drug use: No     Colonoscopy: 08/2020 (Dr. Collene Mares), recall not indicated  PAP: December 2014  Bone density: Never  Lipid panel: Not on file   Allergies  Allergen Reactions   Sulfa Antibiotics Hives and Itching   Gadolinium Derivatives Hives    After gado injection, pt had a few small hives on left breast area. Treated with benadryl by dr Janeece Fitting    Petrolatum Hives and Rash    Current Outpatient Medications  Medication Sig Dispense Refill   anastrozole (ARIMIDEX) 1 MG tablet Take 1 tablet (1 mg total) by mouth daily. 90 tablet 4   LORazepam (ATIVAN) 0.5 MG tablet Take 1 tablet (0.5 mg total) by mouth every 8 (eight) hours. 30 tablet 0   palbociclib (IBRANCE) 75 MG tablet Take 1 tablet (75 mg total) by mouth daily. Take for 21 days on, 7 days off, repeat every 28 days. 21 tablet 6   No current facility-administered medications for this visit.    OBJECTIVE: African-American woman who appears stated age  There were no vitals filed for this visit.  Wt Readings from Last 3 Encounters:  12/17/20 214 lb 12.8 oz (97.4 kg)  10/25/20 204 lb 6.4 oz (92.7 kg)  09/27/20 202 lb 12.8 oz (92 kg)   There is no height or weight on file to calculate BMI.    ECOG FS:1 - Symptomatic but completely ambulatory GENERAL: Patient is a well appearing female in no acute distress HEENT:  Sclerae anicteric.  Oropharynx clear and moist. No ulcerations or evidence of oropharyngeal candidiasis. Neck is supple.  NODES:  No cervical, supraclavicular, or axillary lymphadenopathy palpated.  BREAST EXAM:  Deferred. LUNGS:  Clear to auscultation bilaterally.   No wheezes or rhonchi. HEART:  Regular rate and rhythm. No murmur appreciated. ABDOMEN:  Soft, nontender.  Positive, normoactive bowel sounds. No organomegaly palpated. MSK:  No focal spinal tenderness to palpation. Full range of motion bilaterally in the upper extremities. EXTREMITIES:  No peripheral edema.   SKIN:  Clear with no obvious rashes or skin changes. No nail dyscrasia. NEURO:  Nonfocal. Well oriented.  Appropriate affect.    LAB RESULTS: Lab Results  Component Value Date   WBC 3.1 (L) 01/18/2021   NEUTROABS 1.5 (L) 01/18/2021   HGB 11.0 (L) 01/18/2021   HCT 34.2 (L) 01/18/2021   MCV 91.2 01/18/2021   PLT 250 01/18/2021      Chemistry      Component Value Date/Time   NA 141 01/18/2021 1503   NA 141 10/30/2016 0813   K 3.9 01/18/2021 1503   K 4.0 10/30/2016 0813   CL 106 01/18/2021 1503   CO2 26 01/18/2021 1503   CO2 26 10/30/2016 0813   BUN 15  01/18/2021 1503   BUN 15.2 10/30/2016 0813   CREATININE 1.01 (H) 01/18/2021 1503   CREATININE 0.8 10/30/2016 0813      Component Value Date/Time   CALCIUM 9.9 01/18/2021 1503   CALCIUM 10.0 10/30/2016 0813   ALKPHOS 93 01/18/2021 1503   ALKPHOS 59 10/30/2016 0813   AST 17 01/18/2021 1503   AST 17 10/30/2016 0813   ALT 11 01/18/2021 1503   ALT 11 10/30/2016 0813   BILITOT 0.3 01/18/2021 1503   BILITOT 0.26 10/30/2016 0813      STUDIES: MR Brain W Wo Contrast  Result Date: 01/27/2021 CLINICAL DATA:  Breast cancer staging. EXAM: MRI HEAD AND ORBITS WITHOUT AND WITH CONTRAST TECHNIQUE: Multiplanar, multiecho pulse sequences of the brain and surrounding structures were obtained without and with intravenous contrast. Multiplanar, multiecho pulse sequences of the orbits and surrounding structures were obtained including fat saturation techniques, before and after intravenous contrast administration. CONTRAST:  Reference EMR COMPARISON:  10/20/2020 brain MRI and orbit MRI 04/13/2020 FINDINGS: MRI HEAD FINDINGS Brain: No  evidence of brain metastasis, infarct, hemorrhage, hydrocephalus, or collection. Normal brain volume. Vascular: Normal flow voids and vessel enhancements Skull and upper cervical spine: Diffuse hypointense marrow signal in this patient with known osseous metastatic disease. No evidence of dural or soft tissue extension. MRI ORBITS FINDINGS Orbits: Indistinct infiltrative T2 hyperintensity in the superior left orbit indistinguishable from the lacrimal glands and superior rectus/trochlea. Maximal thickness is in the superficial orbit although there is some continuation towards the apex. Bulk has diminished from 2021. Likely no change from most recent brain MRI, allowing for technical differences. No significant mass effect on adjacent structures. No new lesion is seen. Artifact from mascara. Negative globes and optic nerve sheath complexes. Visualized sinuses: Negative Soft tissues: Negative At contrast injection the patient experienced repetitive sneezing. There were no skin, throat, respiratory findings. The patient was closely monitored throughout the rest of the scan and interviewed after the scan. There are no progressive findings and the patient felt well. We reviewed allergic symptoms and reasons to seek medical attention. IMPRESSION: 1. Infiltrative left orbital metastasis has diminished from initial scan December 2021 and is likely stable from brain MRI June 2022. The extent is underestimated by brain MRI technique, suggest continued use of the orbital protocol at follow-up. 2. Negative for brain metastasis.  Known osseous metastatic disease. 3. Sneezing associated with contrast injection could portend a developing allergy. Consider Benadryl or full pretreatment protocol prior to the next MRI. Electronically Signed   By: Jorje Guild M.D.   On: 01/27/2021 10:52   MR ORBITS W WO CONTRAST  Result Date: 01/27/2021 CLINICAL DATA:  Breast cancer staging. EXAM: MRI HEAD AND ORBITS WITHOUT AND WITH CONTRAST  TECHNIQUE: Multiplanar, multiecho pulse sequences of the brain and surrounding structures were obtained without and with intravenous contrast. Multiplanar, multiecho pulse sequences of the orbits and surrounding structures were obtained including fat saturation techniques, before and after intravenous contrast administration. CONTRAST:  Reference EMR COMPARISON:  10/20/2020 brain MRI and orbit MRI 04/13/2020 FINDINGS: MRI HEAD FINDINGS Brain: No evidence of brain metastasis, infarct, hemorrhage, hydrocephalus, or collection. Normal brain volume. Vascular: Normal flow voids and vessel enhancements Skull and upper cervical spine: Diffuse hypointense marrow signal in this patient with known osseous metastatic disease. No evidence of dural or soft tissue extension. MRI ORBITS FINDINGS Orbits: Indistinct infiltrative T2 hyperintensity in the superior left orbit indistinguishable from the lacrimal glands and superior rectus/trochlea. Maximal thickness is in the superficial orbit although there  is some continuation towards the apex. Bulk has diminished from 2021. Likely no change from most recent brain MRI, allowing for technical differences. No significant mass effect on adjacent structures. No new lesion is seen. Artifact from mascara. Negative globes and optic nerve sheath complexes. Visualized sinuses: Negative Soft tissues: Negative At contrast injection the patient experienced repetitive sneezing. There were no skin, throat, respiratory findings. The patient was closely monitored throughout the rest of the scan and interviewed after the scan. There are no progressive findings and the patient felt well. We reviewed allergic symptoms and reasons to seek medical attention. IMPRESSION: 1. Infiltrative left orbital metastasis has diminished from initial scan December 2021 and is likely stable from brain MRI June 2022. The extent is underestimated by brain MRI technique, suggest continued use of the orbital protocol at  follow-up. 2. Negative for brain metastasis.  Known osseous metastatic disease. 3. Sneezing associated with contrast injection could portend a developing allergy. Consider Benadryl or full pretreatment protocol prior to the next MRI. Electronically Signed   By: Jorje Guild M.D.   On: 01/27/2021 10:52      ASSESSMENT: 45 y.o. BRCA negative [place], [place] woman  (1) status post bilateral mastectomies with left axillary lymph node sampling [6 nodes removed] 01/02/2010 for a pT2 pN0(i+), stage IIA invasive lobular breast cancer, grade 3, estrogen and progesterone receptor positive, HER-2 not amplified  (a) right breast was benign  (2) Oncotype DX score of 22 predicted a 14% risk of distant recurrence within 10 years if the patient's only systemic treatment is tamoxifen for 5 years  (3) status post CMF(cyclophosphamide, fluorouracil, methotrexate) x7 given between October of 2011 and March of 2012 (7 of 8 planned treatments completed)  (4) tamoxifen started April 2012, discontinued approximately July of 2013 (about 15 months) because of problems with hot flashes and insomnia  (5) pathologically documented left axillary recurrence 05/05/2013, the tumor being again estrogen and progesterone receptor positive, with HER-2 not amplified, and MIB-1 of 15%. Staging CT/PET 05/27/2013 show left axillary and supraclavicular recurrence, no distant disease  (6) status post completion left axillary lymph node dissection 06/16/2013 showing a total of 10/15 lymph nodes involved by tumor, with evidence of extracapsular extension (TX N3 = stage IIIC)  (7) treated with carboplatin/ docetaxel, first dose on 07/11/2013, repeated every 21 days x 4 with Neulasta support on day 2.  Final dose was given on 09/12/2013, with chemotherapy discontinued after 4 cycles due to continuing low counts.  (8) radiation completed 12/09/2013: Left chest wall / 50.4 Gray @ 1.8 Gray per fraction x 28 fractions Left Supraclavicular fossa  and PAB/ 6 Gray @1 .8 Gray per fraction x 25 fractions Left scar / 10 Gray at [employer] per fraction x 5 fractions   (9) bilateral salpingo-oophorectomy on 01/25/14  (10)   lymphedema and limited range of motion in the left upper extremity secondary to left axillary lymph node dissection, being treated through the lymphedema clinic  - improved   (13) sequencing and deletion/duplication analysis of 17 genes performed September 2015 at [facility] showed no deleterious mutations in  ATM, BARD1, BRCA1, BRCA2, BRIP1, CDH1, CHEK2, MRE11A, MUTYH, NBN, NF1, PALB2, PTEN, RAD50, RAD51C, RAD51D, and TP53.  (14) tamoxifen re-started 02/27/2014, discontinued December 2021 with progression  (15) bilateral breast implant exchange with capsulotomies 03/18/2018 following left implant rupture and bilateral capsular contractures. Left - [employer] Smooth Round Ultra High Profile Gel 590cc. Ref #[id number].  Serial Number [id number] Right - [employer] Smooth Round Ultra High Profile  Gel 590cc. Ref #[id number].  Serial Number [id number]  METASTATIC DISEASE: December 2021 (16) bone marrow biopsy 04/23/2020 shows metastatic carcinoma, estrogen and progesterone receptor positive  (a) MRI of the cervical spine 04/07/2020 shows markedly abnormal marrow signal, prominent right axillary lymph nodes, degenerative disc disease  (b) MRI of the orbits 04/13/2020 suggests metastatic disease around the left orbit and diffusely abnormal bone marrow signal  (c) CT scan of the chest, abdomen and pelvis 05/10/2020 shows extensive adenopathy (right axillary, prevascular, right hilar, celiac and portacaval axes), multiple liver masses, multiple sclerotic bone lesions  (d) bone scan 05/10/2020 shows uptake through the pelvis spine sternum and ribs and possibly the right mandible.  Consistent with widespread bony metastatic disease  (e) brain MRI 05/09/2020 shows no evidence of intracranial metastases, with left periorbital disease as  previously documented  (f) CA 27-29 is informative (was 155.5 05/11/2020)  (17) radiation to the left periorbital region completed 05/31/2020  (a) repeat brain MRI 10/20/2020 shows no evidence of intracranial metastases.  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  (19) denosumab/ Xgeva started 07/05/2020, repeated every 28 d   PLAN: Batya is   Total encounter time 35 minutes.Wilber Bihari, NP 01/28/21 2:10 AM Medical Oncology and Hematology [facility] [place], [place] [address] Tel. [phone]    Fax. [phone]    *Total Encounter Time as defined by the Centers for Medicare and Medicaid Services includes, in addition to the face-to-face time of a patient visit (documented in the note above) non-face-to-face time: obtaining and reviewing outside history, ordering and reviewing medications, tests or procedures, care coordination (communications with other health care professionals or caregivers) and documentation in the medical record.

## 2021-01-29 ENCOUNTER — Ambulatory Visit: Payer: BC Managed Care – PPO | Admitting: Oncology

## 2021-01-29 ENCOUNTER — Other Ambulatory Visit: Payer: BC Managed Care – PPO

## 2021-01-31 DIAGNOSIS — H53132 Sudden visual loss, left eye: Secondary | ICD-10-CM | POA: Diagnosis not present

## 2021-01-31 DIAGNOSIS — H35352 Cystoid macular degeneration, left eye: Secondary | ICD-10-CM | POA: Diagnosis not present

## 2021-01-31 DIAGNOSIS — H47092 Other disorders of optic nerve, not elsewhere classified, left eye: Secondary | ICD-10-CM | POA: Diagnosis not present

## 2021-01-31 DIAGNOSIS — H34812 Central retinal vein occlusion, left eye, with macular edema: Secondary | ICD-10-CM | POA: Diagnosis not present

## 2021-01-31 DIAGNOSIS — H35012 Changes in retinal vascular appearance, left eye: Secondary | ICD-10-CM | POA: Diagnosis not present

## 2021-01-31 DIAGNOSIS — H35722 Serous detachment of retinal pigment epithelium, left eye: Secondary | ICD-10-CM | POA: Diagnosis not present

## 2021-02-01 DIAGNOSIS — H34812 Central retinal vein occlusion, left eye, with macular edema: Secondary | ICD-10-CM | POA: Diagnosis not present

## 2021-02-02 ENCOUNTER — Other Ambulatory Visit: Payer: BC Managed Care – PPO

## 2021-02-03 ENCOUNTER — Encounter: Payer: Self-pay | Admitting: Oncology

## 2021-02-05 ENCOUNTER — Other Ambulatory Visit: Payer: Self-pay | Admitting: Oncology

## 2021-02-05 NOTE — Progress Notes (Signed)
The patient called to let us know she is receiving eye injections under Dr. 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  According to her the doctor said the retina is not damage.  He hopes the injections will reduce the veins that are the problem

## 2021-02-11 DIAGNOSIS — H35352 Cystoid macular degeneration, left eye: Secondary | ICD-10-CM | POA: Diagnosis not present

## 2021-02-17 DIAGNOSIS — H11432 Conjunctival hyperemia, left eye: Secondary | ICD-10-CM | POA: Diagnosis not present

## 2021-02-17 DIAGNOSIS — H5712 Ocular pain, left eye: Secondary | ICD-10-CM | POA: Diagnosis not present

## 2021-02-17 DIAGNOSIS — S0502XA Injury of conjunctiva and corneal abrasion without foreign body, left eye, initial encounter: Secondary | ICD-10-CM | POA: Diagnosis not present

## 2021-02-17 DIAGNOSIS — H34812 Central retinal vein occlusion, left eye, with macular edema: Secondary | ICD-10-CM | POA: Diagnosis not present

## 2021-02-17 DIAGNOSIS — H35722 Serous detachment of retinal pigment epithelium, left eye: Secondary | ICD-10-CM | POA: Diagnosis not present

## 2021-02-17 NOTE — Progress Notes (Addendum)
[facility]  [phone] [phone]      ID: [name] OB: [birth year]  MR#: [id number]  WEX#:[id number]  Patient Care Team: Chauncey Cruel, MD as PCP - General (Oncology) Radiah Lubinski, Virgie Dad, MD as Consulting Physician (Oncology) Alphonsa Overall, MD as Consulting Physician (General Surgery) Gavin Pound, MD as Consulting Physician (Rheumatology) Katy Fitch, Darlina Guys, MD as Consulting Physician (Ophthalmology) Kyung Rudd, MD as Consulting Physician (Radiation Oncology) Jalene Mullet, MD as Consulting Physician (Ophthalmology) OTHER MD: Annette Stable [phone])  CHIEF COMPLAINT:  Stage IV breast cancer, estrogen receptor positive (s/p bilateral mastectomies)  CURRENT TREATMENT:  anastrozole, palbociclib, denosumab/Xgeva   INTERVAL HISTORY: [name] returns today for follow up of her stage IV breast cancer.  Since her last visit, she underwent follow up brain and orbits MRI on 01/27/2021 showing: diminished infiltrative left orbital metastasis from initial 03/2020 scan; negative for brain metastasis, known osseous metastatic disease.  She continues on anastrozole, which she started 05/01/2020.  She tolerates this with occasional hot flashes but no other significant side effects  She also continues on palbociclib.  Because of low counts we have dropped the dose to 75 mg daily.    We are following her CA 27-29.  This has normalized Lab Results  Component Value Date   CA2729 18.1 01/18/2021   CA2729 19.2 12/17/2020   CA2729 17.8 11/22/2020   CA2729 24.8 10/25/2020   CA2729 36.0 09/27/2020    REVIEW OF SYSTEMS: [name] tells me she was having "water" like visual changes from the left eye.  She is seeing Dr. Posey Pronto and has had a couple of injections which she says are helping.  She did develop significant left eye pain yesterday and he evaluated her and feels there is some inflammation so she was started on steroid drops.  She is  working 40-hour weeks and doing the treadmill at least 15 minutes most days.  She is also starting to do sit ups.  She has had no unusual headaches, no nausea or vomiting, no problems with balance and no falls.  A detailed review of systems was otherwise stable   COVID 19 VACCINATION STATUS: Not vaccinated as of 05/24/2020   BREAST CANCER HISTORY:  From the original intake note:  [name] had bilateral diagnostic mammography [facility] in [place] 11/30/2009 showing a 1.5 cm mass at the 9:00 position of the left breast with some satellites. A biopsy of the mass in question [facility] showed ([id number]) and invasive lobular carcinoma, which was estrogen and progesterone receptor both 100% positive, both with strong staining intensity, but HER-2 negative at 1+. Bilateral breast MRI 12/13/2009 in [place] showed in the left breast an irregularly marginated mass measuring 3.2 cm. There were 4 or 5 separate satellite lesions laterally and superior to the primary.  Accordingly, after appropriate discussion, the patient underwent bilateral mastectomies with left sentinel lymph node sampling 01/02/2010. The right breast was benign. The left breast showed a 2.3 cm invasive lobular carcinoma, grade 3, with a total of 6 sentinel lymph nodes removed, all negative, although 2 showed isolated tumor cells by immunostaining. Margins were negative.  An Oncotype BX was sent, with a recurrence score of 22, predicting a risk of distant recurrence within 10 years with 14% of the patients only systemic treatment was tamoxifen. The patient was treated with CMF chemotherapy between 02/22/2010 and 07/05/2010, receiving 7 cycles at which point the patient refused further chemotherapy though there have been no major toxicities at  according to the oncology note from Dr. Brent General. The patient was then started on tamoxifen 08/09/2010. By her cancer took approximately 12-15 months then stopped  because of hot flashes and insomnia problems.  More recently, the patient noted a change in her left axilla andunderwent bilateral breast MRI January to 2015 at [place]. The patient has bilateral submuscular silicone implants in place. The breast were unremarkable, but there were several lymph nodes with cortical thickening in the left axilla including a dominant 1 measuring 1.2 cm in the short axis. Ultrasonography of this area identified the lymph node in question and the patient underwent biopsy of the left axillary lymph node This showed ([id number]) near-total replacement of the lymph node by metastatic carcinoma. This was 96% estrogen receptor positive, and 84% progesterone receptor positive, both with strong staining intensity. The MIB-1 was 20%. HER-2 determination is pending.  The patient's subsequent history is as detailed below   PAST MEDICAL HISTORY: Past Medical History:  Diagnosis Date   Back muscle spasm    occasional tx with skelaxin   GERD (gastroesophageal reflux disease)    Radiation 10/24/13-12/09/13   Left chestwall/supraclav./scar    PAST SURGICAL HISTORY: Past Surgical History:  Procedure Laterality Date   AXILLARY LYMPH NODE DISSECTION Left 06/16/2013   Procedure: LEFT AXILLARY NODE DISSECTION;  Surgeon: Shann Medal, MD;  Location: [facility];  Service: General;  Laterality: Left;   BILATERAL TOTAL MASTECTOMY WITH AXILLARY LYMPH NODE DISSECTION     BREAST IMPLANT REMOVAL Bilateral 03/18/2018   Procedure: REMOVAL BREAST IMPLANTS;  Surgeon: Wallace Going, DO;  Location: [facility];  Service: Plastics;  Laterality: Bilateral;   BREAST SURGERY     CAPSULECTOMY Bilateral 03/18/2018   Procedure: CAPSULECTOMY;  Surgeon: Wallace Going, DO;  Location: [facility];  Service: Plastics;  Laterality: Bilateral;   LAPAROSCOPIC BILATERAL SALPINGO OOPHERECTOMY Bilateral 01/25/2014   Procedure: LAPAROSCOPIC BILATERAL SALPINGO  OOPHORECTOMY;  Surgeon: Eldred Manges, MD;  Location: [facility];  Service: Gynecology;  Laterality: Bilateral;   LEEP     PLACEMENT OF BREAST IMPLANTS Bilateral 03/18/2018   Procedure: PLACEMENT OF BREAST IMPLANTS;  Surgeon: Wallace Going, DO;  Location: [facility];  Service: Plastics;  Laterality: Bilateral;   PORT-A-CATH REMOVAL N/A 01/25/2014   Procedure: REMOVAL PORT-A-CATH;  Surgeon: Alphonsa Overall, MD;  Location: [facility];  Service: General;  Laterality: N/A;   PORTACATH PLACEMENT N/A 06/16/2013   Procedure: POWER PORT PLACEMENT;  Surgeon: Shann Medal, MD;  Location: [facility];  Service: General;  Laterality: N/A;   right leg surgery     broken femur - has rod in leg   TONSILLECTOMY     WISDOM TOOTH EXTRACTION      FAMILY HISTORY Family History  Problem Relation Age of Onset   Other Mother 36       glioblastoma; deceased 75   Cancer Mother    Other Father 42       glioblastoma; deceased 35   Cancer Father    Hypertension Other    according to the patient both her parents died from a primary brain cancers, namely we have the stomas, her father at 75, her mother at 52. The patient had one brother and one sister. There is no history of breast or ovarian cancer in the family   GYNECOLOGIC HISTORY:   (Reviewed 10/10/2013) Menarche age 41. The patient has never carried a child to term. She was still having regular periods at  the time of her recent diagnosis. LMP 07/18/2013 as of 09/08/2013.  The patient took oral contraceptives between the ages of 81 and 84 with no complications. She stopped having periods with her chemotherapy in 2015.    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 She currently works from [employer] for [employer]Marland Kitchen  Her husband Burnice Logan works at [employer] and also in  [place]: In the absence of any documents to the contrary the patient's husband is her healthcare power of attorney   HEALTH MAINTENANCE:  Social History   Tobacco Use   Smoking status: Never   Smokeless tobacco: Never  Substance Use Topics   Alcohol use: Yes    Comment: socially wine   Drug use: No     Colonoscopy: 08/2020 (Dr. Collene Mares), recall not indicated  PAP: December 2014  Bone density: Never  Lipid panel: Not on file   Allergies  Allergen Reactions   Sulfa Antibiotics Hives and Itching   Gadolinium Derivatives Hives    After gado injection, pt had a few small hives on left breast area. Treated with benadryl by dr Janeece Fitting    Petrolatum Hives and Rash    Current Outpatient Medications  Medication Sig Dispense Refill   anastrozole (ARIMIDEX) 1 MG tablet Take 1 tablet (1 mg total) by mouth daily. 90 tablet 4   LORazepam (ATIVAN) 0.5 MG tablet Take 1 tablet (0.5 mg total) by mouth every 8 (eight) hours. 30 tablet 0   palbociclib (IBRANCE) 75 MG tablet Take 1 tablet (75 mg total) by mouth daily. Take for 21 days on, 7 days off, repeat every 28 days. 21 tablet 6   No current facility-administered medications for this visit.    OBJECTIVE: African-American woman in no acute distress  Vitals:   02/18/21 1222  BP: 130/80  Pulse: 92  Resp: 18  Temp: 97.7 F (36.5 C)  SpO2: 100%   Wt Readings from Last 3 Encounters:  02/18/21 218 lb 8 oz (99.1 kg)  12/17/20 214 lb 12.8 oz (97.4 kg)  10/25/20 204 lb 6.4 oz (92.7 kg)   Body mass index is 33.22 kg/m.    ECOG FS:1 - Symptomatic but completely ambulatory  Sclerae unicteric, EOMs intact Wearing a mask No cervical or supraclavicular adenopathy Lungs no rales or rhonchi Heart regular rate and rhythm Abd soft, nontender, positive bowel sounds MSK no focal spinal tenderness, no upper extremity lymphedema Neuro: nonfocal, well oriented, appropriate affect Breasts: Deferred   LAB  RESULTS: Lab Results  Component Value Date   WBC 2.9 (L) 02/18/2021   NEUTROABS 1.1 (L) 02/18/2021   HGB 11.8 (L) 02/18/2021   HCT 36.2 02/18/2021   MCV 90.3 02/18/2021   PLT 275 02/18/2021      Chemistry      Component Value Date/Time   NA 139 02/18/2021 1210   NA 141 10/30/2016 0813   K 4.2 02/18/2021 1210   K 4.0 10/30/2016 0813   CL 108 02/18/2021 1210   CO2 22 02/18/2021 1210   CO2 26 10/30/2016 0813   BUN 12 02/18/2021 1210   BUN 15.2 10/30/2016 0813   CREATININE 0.81 02/18/2021 1210   CREATININE 0.8 10/30/2016 0813      Component Value Date/Time   CALCIUM 10.1 02/18/2021 1210   CALCIUM 10.0 10/30/2016 0813   ALKPHOS  74 02/18/2021 1210   ALKPHOS 59 10/30/2016 0813   AST 20 02/18/2021 1210   AST 17 10/30/2016 0813   ALT 15 02/18/2021 1210   ALT 11 10/30/2016 0813   BILITOT 0.5 02/18/2021 1210   BILITOT 0.26 10/30/2016 0813      STUDIES: MR Brain W Wo Contrast  Result Date: 01/27/2021 CLINICAL DATA:  Breast cancer staging. EXAM: MRI HEAD AND ORBITS WITHOUT AND WITH CONTRAST TECHNIQUE: Multiplanar, multiecho pulse sequences of the brain and surrounding structures were obtained without and with intravenous contrast. Multiplanar, multiecho pulse sequences of the orbits and surrounding structures were obtained including fat saturation techniques, before and after intravenous contrast administration. CONTRAST:  Reference EMR COMPARISON:  10/20/2020 brain MRI and orbit MRI 04/13/2020 FINDINGS: MRI HEAD FINDINGS Brain: No evidence of brain metastasis, infarct, hemorrhage, hydrocephalus, or collection. Normal brain volume. Vascular: Normal flow voids and vessel enhancements Skull and upper cervical spine: Diffuse hypointense marrow signal in this patient with known osseous metastatic disease. No evidence of dural or soft tissue extension. MRI ORBITS FINDINGS Orbits: Indistinct infiltrative T2 hyperintensity in the superior left orbit indistinguishable from the lacrimal glands  and superior rectus/trochlea. Maximal thickness is in the superficial orbit although there is some continuation towards the apex. Bulk has diminished from 2021. Likely no change from most recent brain MRI, allowing for technical differences. No significant mass effect on adjacent structures. No new lesion is seen. Artifact from mascara. Negative globes and optic nerve sheath complexes. Visualized sinuses: Negative Soft tissues: Negative At contrast injection the patient experienced repetitive sneezing. There were no skin, throat, respiratory findings. The patient was closely monitored throughout the rest of the scan and interviewed after the scan. There are no progressive findings and the patient felt well. We reviewed allergic symptoms and reasons to seek medical attention. IMPRESSION: 1. Infiltrative left orbital metastasis has diminished from initial scan December 2021 and is likely stable from brain MRI June 2022. The extent is underestimated by brain MRI technique, suggest continued use of the orbital protocol at follow-up. 2. Negative for brain metastasis.  Known osseous metastatic disease. 3. Sneezing associated with contrast injection could portend a developing allergy. Consider Benadryl or full pretreatment protocol prior to the next MRI. Electronically Signed   By: Jorje Guild M.D.   On: 01/27/2021 10:52   MR ORBITS W WO CONTRAST  Result Date: 01/27/2021 CLINICAL DATA:  Breast cancer staging. EXAM: MRI HEAD AND ORBITS WITHOUT AND WITH CONTRAST TECHNIQUE: Multiplanar, multiecho pulse sequences of the brain and surrounding structures were obtained without and with intravenous contrast. Multiplanar, multiecho pulse sequences of the orbits and surrounding structures were obtained including fat saturation techniques, before and after intravenous contrast administration. CONTRAST:  Reference EMR COMPARISON:  10/20/2020 brain MRI and orbit MRI 04/13/2020 FINDINGS: MRI HEAD FINDINGS Brain: No evidence of  brain metastasis, infarct, hemorrhage, hydrocephalus, or collection. Normal brain volume. Vascular: Normal flow voids and vessel enhancements Skull and upper cervical spine: Diffuse hypointense marrow signal in this patient with known osseous metastatic disease. No evidence of dural or soft tissue extension. MRI ORBITS FINDINGS Orbits: Indistinct infiltrative T2 hyperintensity in the superior left orbit indistinguishable from the lacrimal glands and superior rectus/trochlea. Maximal thickness is in the superficial orbit although there is some continuation towards the apex. Bulk has diminished from 2021. Likely no change from most recent brain MRI, allowing for technical differences. No significant mass effect on adjacent structures. No new lesion is seen. Artifact from mascara. Negative globes and optic nerve sheath  complexes. Visualized sinuses: Negative Soft tissues: Negative At contrast injection the patient experienced repetitive sneezing. There were no skin, throat, respiratory findings. The patient was closely monitored throughout the rest of the scan and interviewed after the scan. There are no progressive findings and the patient felt well. We reviewed allergic symptoms and reasons to seek medical attention. IMPRESSION: 1. Infiltrative left orbital metastasis has diminished from initial scan December 2021 and is likely stable from brain MRI June 2022. The extent is underestimated by brain MRI technique, suggest continued use of the orbital protocol at follow-up. 2. Negative for brain metastasis.  Known osseous metastatic disease. 3. Sneezing associated with contrast injection could portend a developing allergy. Consider Benadryl or full pretreatment protocol prior to the next MRI. Electronically Signed   By: Jorje Guild M.D.   On: 01/27/2021 10:52      ASSESSMENT: 45 y.o. BRCA negative [place] woman  (1) status post bilateral mastectomies with left axillary lymph node sampling [6 nodes removed]  01/02/2010 for a pT2 pN0(i+), stage IIA invasive lobular breast cancer, grade 3, estrogen and progesterone receptor positive, HER-2 not amplified  (a) right breast was benign  (2) Oncotype DX score of 22 predicted a 14% risk of distant recurrence within 10 years if the patient's only systemic treatment is tamoxifen for 5 years  (3) status post CMF(cyclophosphamide, fluorouracil, methotrexate) x7 given between October of 2011 and March of 2012 (7 of 8 planned treatments completed)  (4) tamoxifen started April 2012, discontinued approximately July of 2013 (about 15 months) because of problems with hot flashes and insomnia  (5) pathologically documented left axillary recurrence 05/05/2013, the tumor being again estrogen and progesterone receptor positive, with HER-2 not amplified, and MIB-1 of 15%. Staging CT/PET 05/27/2013 show left axillary and supraclavicular recurrence, no distant disease  (6) status post completion left axillary lymph node dissection 06/16/2013 showing a total of 10/15 lymph nodes involved by tumor, with evidence of extracapsular extension (TX N3 = stage IIIC)  (7) treated with carboplatin/ docetaxel, first dose on 07/11/2013, repeated every 21 days x 4 with Neulasta support on day 2.  Final dose was given on 09/12/2013, with chemotherapy discontinued after 4 cycles due to continuing low counts.  (8) radiation completed 12/09/2013: Left chest wall / 50.4 Gray @ 1.8 Gray per fraction x 28 fractions Left Supraclavicular fossa and PAB/ 45 Gray _0 .8 Gray per fraction x 25 fractions Left scar / 10 Gray at [employer] per fraction x 5 fractions   (9) bilateral salpingo-oophorectomy on 01/25/14  (10)   lymphedema and limited range of motion in the left upper extremity secondary to left axillary lymph node dissection, being treated through the lymphedema clinic  - improved   (13) sequencing and deletion/duplication analysis of 17 genes performed September 2015 at [facility] showed no  deleterious mutations in  ATM, BARD1, BRCA1, BRCA2, BRIP1, CDH1, CHEK2, MRE11A, MUTYH, NBN, NF1, PALB2, PTEN, RAD50, RAD51C, RAD51D, and TP53.  (14) tamoxifen re-started 02/27/2014, discontinued December 2021 with progression  (15) bilateral breast implant exchange with capsulotomies 03/18/2018 following left implant rupture and bilateral capsular contractures. Left - [employer] Smooth Round Ultra High Profile Gel 590cc. Ref #[id number].  Serial Number [id number] Right - [employer] Smooth Round Ultra High Profile Gel 590cc. Ref #[id number].  Serial Number [id number]  METASTATIC DISEASE: December 2021 (16) bone marrow biopsy 04/23/2020 shows metastatic carcinoma, estrogen and progesterone receptor positive  (a) MRI of the cervical spine 04/07/2020 shows markedly abnormal marrow signal, prominent right axillary lymph  nodes, degenerative disc disease  (b) MRI of the orbits 04/13/2020 suggests metastatic disease around the left orbit and diffusely abnormal bone marrow signal  (c) CT scan of the chest, abdomen and pelvis 05/10/2020 shows extensive adenopathy (right axillary, prevascular, right hilar, celiac and portacaval axes), multiple liver masses, multiple sclerotic bone lesions  (d) bone scan 05/10/2020 shows uptake through the pelvis spine sternum and ribs and possibly the right mandible.  Consistent with widespread bony metastatic disease  (e) brain MRI 05/09/2020 shows no evidence of intracranial metastases, with left periorbital disease as previously documented  (f) CA 27-29 is informative (was 155.5 05/11/2020)  (17) radiation to the left periorbital region completed 05/31/2020  (a) repeat brain MRI 10/20/2020 shows no evidence of intracranial metastases.  There are skull metastases and there continues to be ill-defined soft tissue around the left globe  (18) anastrozole started 05/01/2020  (a) palbociclib started 05/11/2020  (b) dose reduced with second cycle to 75 mg daily due to  cytopenias  (c) CT of the abdomen and pelvis 08/29/2020 shows significant response  (d) CT of the chest abdomen and pelvis August 2022 shows continuing response in her measurable disease, no new bony lesions.  (e) brain and orbital MRI 01/27/2021 shows no brain lesions, stable to slightly improved periorbital disease  (19) denosumab/ Xgeva started 07/05/2020, repeated every 28 d   PLAN: Jenina is coming up in a year from definitive diagnosis of metastatic breast cancer.  Her disease is well controlled at present.  She is tolerating treatment well.  Accordingly the plan is to continue the [facility] today and every 4 weeks, and the palbociclib daily 3 weeks on and 1 week off at the current dose.  Of course she also receives anastrozole daily.  She will see me again in 8 weeks.  At that time will consider repeating CT of the chest for restaging purposes  Total encounter time 25 minutes.*   Dameon Soltis, Virgie Dad, MD  02/18/21 2:04 PM Medical Oncology and Hematology [facility] [place], [place] [address] Tel. [phone]    Fax. [phone]   I, Wilburn Mylar, am acting as scribe for Dr. Virgie Dad. Jennise Both.  I, Lurline Del MD, have reviewed the above documentation for accuracy and completeness, and I agree with the above.   *Total Encounter Time as defined by the Centers for Medicare and Medicaid Services includes, in addition to the face-to-face time of a patient visit (documented in the note above) non-face-to-face time: obtaining and reviewing outside history, ordering and reviewing medications, tests or procedures, care coordination (communications with other health care professionals or caregivers) and documentation in the medical record.

## 2021-02-18 ENCOUNTER — Other Ambulatory Visit: Payer: Self-pay

## 2021-02-18 ENCOUNTER — Inpatient Hospital Stay: Payer: BC Managed Care – PPO | Attending: Oncology

## 2021-02-18 ENCOUNTER — Encounter: Payer: Self-pay | Admitting: Oncology

## 2021-02-18 ENCOUNTER — Inpatient Hospital Stay (HOSPITAL_BASED_OUTPATIENT_CLINIC_OR_DEPARTMENT_OTHER): Payer: BC Managed Care – PPO | Admitting: Oncology

## 2021-02-18 ENCOUNTER — Inpatient Hospital Stay: Payer: BC Managed Care – PPO

## 2021-02-18 VITALS — BP 130/80 | HR 92 | Temp 97.7°F | Resp 18 | Ht 68.0 in | Wt 218.5 lb

## 2021-02-18 DIAGNOSIS — C50812 Malignant neoplasm of overlapping sites of left female breast: Secondary | ICD-10-CM | POA: Diagnosis not present

## 2021-02-18 DIAGNOSIS — C7951 Secondary malignant neoplasm of bone: Secondary | ICD-10-CM

## 2021-02-18 DIAGNOSIS — C50911 Malignant neoplasm of unspecified site of right female breast: Secondary | ICD-10-CM | POA: Diagnosis not present

## 2021-02-18 DIAGNOSIS — C787 Secondary malignant neoplasm of liver and intrahepatic bile duct: Secondary | ICD-10-CM | POA: Diagnosis not present

## 2021-02-18 DIAGNOSIS — C773 Secondary and unspecified malignant neoplasm of axilla and upper limb lymph nodes: Secondary | ICD-10-CM

## 2021-02-18 DIAGNOSIS — C771 Secondary and unspecified malignant neoplasm of intrathoracic lymph nodes: Secondary | ICD-10-CM

## 2021-02-18 DIAGNOSIS — C50919 Malignant neoplasm of unspecified site of unspecified female breast: Secondary | ICD-10-CM

## 2021-02-18 DIAGNOSIS — C50912 Malignant neoplasm of unspecified site of left female breast: Secondary | ICD-10-CM

## 2021-02-18 LAB — CBC WITH DIFFERENTIAL (CANCER CENTER ONLY)
Abs Immature Granulocytes: 0.01 10*3/uL (ref 0.00–0.07)
Basophils Absolute: 0 10*3/uL (ref 0.0–0.1)
Basophils Relative: 1 %
Eosinophils Absolute: 0 10*3/uL (ref 0.0–0.5)
Eosinophils Relative: 1 %
HCT: 36.2 % (ref 36.0–46.0)
Hemoglobin: 11.8 g/dL — ABNORMAL LOW (ref 12.0–15.0)
Immature Granulocytes: 0 %
Lymphocytes Relative: 54 %
Lymphs Abs: 1.5 10*3/uL (ref 0.7–4.0)
MCH: 29.4 pg (ref 26.0–34.0)
MCHC: 32.6 g/dL (ref 30.0–36.0)
MCV: 90.3 fL (ref 80.0–100.0)
Monocytes Absolute: 0.1 10*3/uL (ref 0.1–1.0)
Monocytes Relative: 5 %
Neutro Abs: 1.1 10*3/uL — ABNORMAL LOW (ref 1.7–7.7)
Neutrophils Relative %: 39 %
Platelet Count: 275 10*3/uL (ref 150–400)
RBC: 4.01 MIL/uL (ref 3.87–5.11)
RDW: 14.7 % (ref 11.5–15.5)
WBC Count: 2.9 10*3/uL — ABNORMAL LOW (ref 4.0–10.5)
nRBC: 0 % (ref 0.0–0.2)

## 2021-02-18 LAB — CMP (CANCER CENTER ONLY)
ALT: 15 U/L (ref 0–44)
AST: 20 U/L (ref 15–41)
Albumin: 4.6 g/dL (ref 3.5–5.0)
Alkaline Phosphatase: 74 U/L (ref 38–126)
Anion gap: 9 (ref 5–15)
BUN: 12 mg/dL (ref 6–20)
CO2: 22 mmol/L (ref 22–32)
Calcium: 10.1 mg/dL (ref 8.9–10.3)
Chloride: 108 mmol/L (ref 98–111)
Creatinine: 0.81 mg/dL (ref 0.44–1.00)
GFR, Estimated: >60 mL/min (ref >60)
Glucose, Bld: 129 mg/dL — ABNORMAL HIGH (ref 70–99)
Potassium: 4.2 mmol/L (ref 3.5–5.1)
Sodium: 139 mmol/L (ref 135–145)
Total Bilirubin: 0.5 mg/dL (ref 0.3–1.2)
Total Protein: 8.1 g/dL (ref 6.5–8.1)

## 2021-02-18 LAB — SAMPLE TO BLOOD BANK

## 2021-02-18 MED ORDER — ACETAMINOPHEN 325 MG PO TABS
650.0000 mg | ORAL_TABLET | Freq: Once | ORAL | Status: AC
  Administered 2021-02-18: 650 mg via ORAL
  Filled 2021-02-18: qty 2

## 2021-02-18 MED ORDER — IBUPROFEN 200 MG PO TABS
400.0000 mg | ORAL_TABLET | Freq: Once | ORAL | Status: AC
  Administered 2021-02-18: 400 mg via ORAL
  Filled 2021-02-18: qty 2

## 2021-02-18 MED ORDER — DENOSUMAB 120 MG/1.7ML ~~LOC~~ SOLN
120.0000 mg | Freq: Once | SUBCUTANEOUS | Status: AC
  Administered 2021-02-18: 120 mg via SUBCUTANEOUS
  Filled 2021-02-18: qty 1.7

## 2021-02-19 ENCOUNTER — Encounter: Payer: Self-pay | Admitting: Oncology

## 2021-02-19 LAB — CANCER ANTIGEN 27.29: CA 27.29: 19.7 U/mL (ref 0.0–38.6)

## 2021-03-05 ENCOUNTER — Encounter: Payer: Self-pay | Admitting: Oncology

## 2021-03-07 DIAGNOSIS — H35352 Cystoid macular degeneration, left eye: Secondary | ICD-10-CM | POA: Diagnosis not present

## 2021-03-14 ENCOUNTER — Encounter: Payer: Self-pay | Admitting: Hematology and Oncology

## 2021-03-18 ENCOUNTER — Inpatient Hospital Stay: Payer: BC Managed Care – PPO

## 2021-03-18 ENCOUNTER — Inpatient Hospital Stay: Payer: BC Managed Care – PPO | Attending: Oncology

## 2021-03-18 ENCOUNTER — Other Ambulatory Visit: Payer: Self-pay

## 2021-03-18 VITALS — BP 119/90 | HR 80 | Temp 98.6°F | Resp 18

## 2021-03-18 DIAGNOSIS — Z79899 Other long term (current) drug therapy: Secondary | ICD-10-CM | POA: Diagnosis not present

## 2021-03-18 DIAGNOSIS — Z9013 Acquired absence of bilateral breasts and nipples: Secondary | ICD-10-CM | POA: Diagnosis not present

## 2021-03-18 DIAGNOSIS — C7951 Secondary malignant neoplasm of bone: Secondary | ICD-10-CM | POA: Diagnosis not present

## 2021-03-18 DIAGNOSIS — C50919 Malignant neoplasm of unspecified site of unspecified female breast: Secondary | ICD-10-CM

## 2021-03-18 DIAGNOSIS — Z170 Estrogen receptor positive status [ER+]: Secondary | ICD-10-CM | POA: Diagnosis not present

## 2021-03-18 DIAGNOSIS — C50912 Malignant neoplasm of unspecified site of left female breast: Secondary | ICD-10-CM

## 2021-03-18 DIAGNOSIS — C50812 Malignant neoplasm of overlapping sites of left female breast: Secondary | ICD-10-CM | POA: Diagnosis not present

## 2021-03-18 DIAGNOSIS — Z79811 Long term (current) use of aromatase inhibitors: Secondary | ICD-10-CM | POA: Diagnosis not present

## 2021-03-18 DIAGNOSIS — C773 Secondary and unspecified malignant neoplasm of axilla and upper limb lymph nodes: Secondary | ICD-10-CM

## 2021-03-18 LAB — CBC WITH DIFFERENTIAL (CANCER CENTER ONLY)
Abs Immature Granulocytes: 0 10*3/uL (ref 0.00–0.07)
Basophils Absolute: 0 10*3/uL (ref 0.0–0.1)
Basophils Relative: 1 %
Eosinophils Absolute: 0.1 10*3/uL (ref 0.0–0.5)
Eosinophils Relative: 2 %
HCT: 34.9 % — ABNORMAL LOW (ref 36.0–46.0)
Hemoglobin: 11.3 g/dL — ABNORMAL LOW (ref 12.0–15.0)
Immature Granulocytes: 0 %
Lymphocytes Relative: 60 %
Lymphs Abs: 1.5 10*3/uL (ref 0.7–4.0)
MCH: 29.2 pg (ref 26.0–34.0)
MCHC: 32.4 g/dL (ref 30.0–36.0)
MCV: 90.2 fL (ref 80.0–100.0)
Monocytes Absolute: 0.1 10*3/uL (ref 0.1–1.0)
Monocytes Relative: 4 %
Neutro Abs: 0.8 10*3/uL — ABNORMAL LOW (ref 1.7–7.7)
Neutrophils Relative %: 33 %
Platelet Count: 279 10*3/uL (ref 150–400)
RBC: 3.87 MIL/uL (ref 3.87–5.11)
RDW: 14.7 % (ref 11.5–15.5)
WBC Count: 2.4 10*3/uL — ABNORMAL LOW (ref 4.0–10.5)
nRBC: 0 % (ref 0.0–0.2)

## 2021-03-18 LAB — CMP (CANCER CENTER ONLY)
ALT: 14 U/L (ref 0–44)
AST: 18 U/L (ref 15–41)
Albumin: 4.3 g/dL (ref 3.5–5.0)
Alkaline Phosphatase: 79 U/L (ref 38–126)
Anion gap: 9 (ref 5–15)
BUN: 14 mg/dL (ref 6–20)
CO2: 22 mmol/L (ref 22–32)
Calcium: 10.1 mg/dL (ref 8.9–10.3)
Chloride: 109 mmol/L (ref 98–111)
Creatinine: 0.8 mg/dL (ref 0.44–1.00)
GFR, Estimated: >60 mL/min (ref >60)
Glucose, Bld: 85 mg/dL (ref 70–99)
Potassium: 4.1 mmol/L (ref 3.5–5.1)
Sodium: 140 mmol/L (ref 135–145)
Total Bilirubin: <0.2 mg/dL — ABNORMAL LOW (ref 0.3–1.2)
Total Protein: 8.1 g/dL (ref 6.5–8.1)

## 2021-03-18 LAB — SAMPLE TO BLOOD BANK

## 2021-03-18 MED ORDER — DENOSUMAB 120 MG/1.7ML ~~LOC~~ SOLN
120.0000 mg | Freq: Once | SUBCUTANEOUS | Status: AC
  Administered 2021-03-18: 120 mg via SUBCUTANEOUS
  Filled 2021-03-18: qty 1.7

## 2021-03-18 NOTE — Patient Instructions (Signed)
Denosumab injection What is this medication? DENOSUMAB (den oh sue mab) slows bone breakdown. Prolia is used to treat osteoporosis in women after menopause and in men, and in people who are taking corticosteroids for 6 months or more. Xgeva is used to treat a high calcium level due to cancer and to prevent bone fractures and other bone problems caused by multiple myeloma or cancer bone metastases. Xgeva is also used to treat giant cell tumor of the bone. This medicine may be used for other purposes; ask your health care provider or pharmacist if you have questions. COMMON BRAND NAME(S): Prolia, XGEVA What should I tell my care team before I take this medication? They need to know if you have any of these conditions: dental disease having surgery or tooth extraction infection kidney disease low levels of calcium or Vitamin D in the blood malnutrition on hemodialysis skin conditions or sensitivity thyroid or parathyroid disease an unusual reaction to denosumab, other medicines, foods, dyes, or preservatives pregnant or trying to get pregnant breast-feeding How should I use this medication? This medicine is for injection under the skin. It is given by a health care professional in a hospital or clinic setting. A special MedGuide will be given to you before each treatment. Be sure to read this information carefully each time. For Prolia, talk to your pediatrician regarding the use of this medicine in children. Special care may be needed. For Xgeva, talk to your pediatrician regarding the use of this medicine in children. While this drug may be prescribed for children as young as 13 years for selected conditions, precautions do apply. Overdosage: If you think you have taken too much of this medicine contact a poison control center or emergency room at once. NOTE: This medicine is only for you. Do not share this medicine with others. What if I miss a dose? It is important not to miss your dose.  Call your doctor or health care professional if you are unable to keep an appointment. What may interact with this medication? Do not take this medicine with any of the following medications: other medicines containing denosumab This medicine may also interact with the following medications: medicines that lower your chance of fighting infection steroid medicines like prednisone or cortisone This list may not describe all possible interactions. Give your health care provider a list of all the medicines, herbs, non-prescription drugs, or dietary supplements you use. Also tell them if you smoke, drink alcohol, or use illegal drugs. Some items may interact with your medicine. What should I watch for while using this medication? Visit your doctor or health care professional for regular checks on your progress. Your doctor or health care professional may order blood tests and other tests to see how you are doing. Call your doctor or health care professional for advice if you get a fever, chills or sore throat, or other symptoms of a cold or flu. Do not treat yourself. This drug may decrease your body's ability to fight infection. Try to avoid being around people who are sick. You should make sure you get enough calcium and vitamin D while you are taking this medicine, unless your doctor tells you not to. Discuss the foods you eat and the vitamins you take with your health care professional. See your dentist regularly. Brush and floss your teeth as directed. Before you have any dental work done, tell your dentist you are receiving this medicine. Do not become pregnant while taking this medicine or for 5 months after   stopping it. Talk with your doctor or health care professional about your birth control options while taking this medicine. Women should inform their doctor if they wish to become pregnant or think they might be pregnant. There is a potential for serious side effects to an unborn child. Talk to  your health care professional or pharmacist for more information. What side effects may I notice from receiving this medication? Side effects that you should report to your doctor or health care professional as soon as possible: allergic reactions like skin rash, itching or hives, swelling of the face, lips, or tongue bone pain breathing problems dizziness jaw pain, especially after dental work redness, blistering, peeling of the skin signs and symptoms of infection like fever or chills; cough; sore throat; pain or trouble passing urine signs of low calcium like fast heartbeat, muscle cramps or muscle pain; pain, tingling, numbness in the hands or feet; seizures unusual bleeding or bruising unusually weak or tired Side effects that usually do not require medical attention (report to your doctor or health care professional if they continue or are bothersome): constipation diarrhea headache joint pain loss of appetite muscle pain runny nose tiredness upset stomach This list may not describe all possible side effects. Call your doctor for medical advice about side effects. You may report side effects to FDA at 1-800-FDA-1088. Where should I keep my medication? This medicine is only given in a clinic, doctor's office, or other health care setting and will not be stored at home. NOTE: This sheet is a summary. It may not cover all possible information. If you have questions about this medicine, talk to your doctor, pharmacist, or health care provider.  2022 Elsevier/Gold Standard (2017-08-21 00:00:00)

## 2021-03-19 LAB — CANCER ANTIGEN 27.29: CA 27.29: 13 U/mL (ref 0.0–38.6)

## 2021-03-22 ENCOUNTER — Encounter: Payer: Self-pay | Admitting: Oncology

## 2021-03-22 ENCOUNTER — Telehealth: Payer: Self-pay

## 2021-03-22 NOTE — Telephone Encounter (Signed)
Returned pt call/mychart message.  Pt reports COVID positive at home test.  Pt denies fever but says she has had chills and been warm, some chest congestion and couch.  I reviewed OTC meds with pt: Mucinex DM and tylenol/motrin PRN.  I advised pt to monitor temperature at home and seek medical care if temp raises over 100.4 and OTC meds not helping.  Pt verbalized understanding and agrees to seek urgent care if symptoms persist.

## 2021-03-25 ENCOUNTER — Encounter: Payer: Self-pay | Admitting: Oncology

## 2021-03-25 MED ORDER — AZITHROMYCIN 250 MG PO TABS: ORAL_TABLET | ORAL | 0 refills | Status: DC

## 2021-03-26 ENCOUNTER — Telehealth: Payer: Self-pay | Admitting: Oncology

## 2021-03-26 NOTE — Telephone Encounter (Signed)
Rescheduled appointment per provider. Patient aware.  

## 2021-03-28 ENCOUNTER — Encounter: Payer: Self-pay | Admitting: Oncology

## 2021-03-29 ENCOUNTER — Encounter: Payer: Self-pay | Admitting: Oncology

## 2021-04-01 ENCOUNTER — Telehealth: Payer: Self-pay

## 2021-04-01 ENCOUNTER — Encounter: Payer: Self-pay | Admitting: Oncology

## 2021-04-01 NOTE — Telephone Encounter (Signed)
Oral Chemotherapy Pharmacist Encounter  I spoke to patient regarding medication coverage for [employer] from [employer]. Patient did not have any additional funds on the initial copay card, spoke to representative and they are working on applying a secondary card for coverage. I was advised that patient should receive a call today or tomorrow to set up shipment and medication should be covered.   I gave the patient my phone number in case she has an additional problems.   Drema Halon, PharmD Hematology/Oncology Clinical Pharmacist [facility] [phone]

## 2021-04-11 ENCOUNTER — Encounter: Payer: Self-pay | Admitting: Oncology

## 2021-04-15 ENCOUNTER — Other Ambulatory Visit: Payer: Self-pay

## 2021-04-15 ENCOUNTER — Inpatient Hospital Stay: Payer: BC Managed Care – PPO

## 2021-04-15 ENCOUNTER — Other Ambulatory Visit: Payer: BC Managed Care – PPO

## 2021-04-15 ENCOUNTER — Encounter: Payer: Self-pay | Admitting: Adult Health

## 2021-04-15 ENCOUNTER — Ambulatory Visit: Payer: BC Managed Care – PPO | Admitting: Oncology

## 2021-04-15 ENCOUNTER — Ambulatory Visit: Payer: BC Managed Care – PPO

## 2021-04-15 ENCOUNTER — Inpatient Hospital Stay: Payer: BC Managed Care – PPO | Attending: Oncology | Admitting: Adult Health

## 2021-04-15 ENCOUNTER — Encounter: Payer: Self-pay | Admitting: Oncology

## 2021-04-15 VITALS — BP 129/74 | HR 89 | Temp 97.7°F | Resp 18 | Ht 68.0 in | Wt 222.6 lb

## 2021-04-15 DIAGNOSIS — C771 Secondary and unspecified malignant neoplasm of intrathoracic lymph nodes: Secondary | ICD-10-CM

## 2021-04-15 DIAGNOSIS — C50911 Malignant neoplasm of unspecified site of right female breast: Secondary | ICD-10-CM

## 2021-04-15 DIAGNOSIS — C50812 Malignant neoplasm of overlapping sites of left female breast: Secondary | ICD-10-CM | POA: Insufficient documentation

## 2021-04-15 DIAGNOSIS — C787 Secondary malignant neoplasm of liver and intrahepatic bile duct: Secondary | ICD-10-CM | POA: Diagnosis not present

## 2021-04-15 DIAGNOSIS — H34812 Central retinal vein occlusion, left eye, with macular edema: Secondary | ICD-10-CM | POA: Diagnosis not present

## 2021-04-15 DIAGNOSIS — C7951 Secondary malignant neoplasm of bone: Secondary | ICD-10-CM | POA: Diagnosis not present

## 2021-04-15 DIAGNOSIS — C773 Secondary and unspecified malignant neoplasm of axilla and upper limb lymph nodes: Secondary | ICD-10-CM

## 2021-04-15 LAB — CMP (CANCER CENTER ONLY)
ALT: 13 U/L (ref 0–44)
AST: 17 U/L (ref 15–41)
Albumin: 4 g/dL (ref 3.5–5.0)
Alkaline Phosphatase: 69 U/L (ref 38–126)
Anion gap: 10 (ref 5–15)
BUN: 9 mg/dL (ref 6–20)
CO2: 24 mmol/L (ref 22–32)
Calcium: 9.5 mg/dL (ref 8.9–10.3)
Chloride: 108 mmol/L (ref 98–111)
Creatinine: 0.93 mg/dL (ref 0.44–1.00)
GFR, Estimated: >60 mL/min (ref >60)
Glucose, Bld: 137 mg/dL — ABNORMAL HIGH (ref 70–99)
Potassium: 3.7 mmol/L (ref 3.5–5.1)
Sodium: 142 mmol/L (ref 135–145)
Total Bilirubin: 0.3 mg/dL (ref 0.3–1.2)
Total Protein: 7.7 g/dL (ref 6.5–8.1)

## 2021-04-15 LAB — CBC WITH DIFFERENTIAL (CANCER CENTER ONLY)
Abs Immature Granulocytes: 0.01 10*3/uL (ref 0.00–0.07)
Basophils Absolute: 0 10*3/uL (ref 0.0–0.1)
Basophils Relative: 1 %
Eosinophils Absolute: 0 10*3/uL (ref 0.0–0.5)
Eosinophils Relative: 1 %
HCT: 32.6 % — ABNORMAL LOW (ref 36.0–46.0)
Hemoglobin: 10.6 g/dL — ABNORMAL LOW (ref 12.0–15.0)
Immature Granulocytes: 0 %
Lymphocytes Relative: 57 %
Lymphs Abs: 1.5 10*3/uL (ref 0.7–4.0)
MCH: 29.5 pg (ref 26.0–34.0)
MCHC: 32.5 g/dL (ref 30.0–36.0)
MCV: 90.8 fL (ref 80.0–100.0)
Monocytes Absolute: 0.1 10*3/uL (ref 0.1–1.0)
Monocytes Relative: 5 %
Neutro Abs: 0.9 10*3/uL — ABNORMAL LOW (ref 1.7–7.7)
Neutrophils Relative %: 36 %
Platelet Count: 242 10*3/uL (ref 150–400)
RBC: 3.59 MIL/uL — ABNORMAL LOW (ref 3.87–5.11)
RDW: 15 % (ref 11.5–15.5)
WBC Count: 2.6 10*3/uL — ABNORMAL LOW (ref 4.0–10.5)
nRBC: 0 % (ref 0.0–0.2)

## 2021-04-15 LAB — SAMPLE TO BLOOD BANK

## 2021-04-15 MED ORDER — DENOSUMAB 120 MG/1.7ML ~~LOC~~ SOLN
120.0000 mg | Freq: Once | SUBCUTANEOUS | Status: AC
  Administered 2021-04-15: 15:00:00 120 mg via SUBCUTANEOUS
  Filled 2021-04-15: qty 1.7

## 2021-04-15 NOTE — Progress Notes (Signed)
[facility]  [phone] [phone]      ID: [name] OB: 21-Dec-1975  MR#: [id number]  GNO#:[id number]  Patient Care Team: Jana Hakim Virgie Dad, MD as PCP - General (Oncology) Magrinat, Virgie Dad, MD as Consulting Physician (Oncology) Alphonsa Overall, MD as Consulting Physician (General Surgery) Gavin Pound, MD as Consulting Physician (Rheumatology) Katy Fitch, Darlina Guys, MD as Consulting Physician (Ophthalmology) Kyung Rudd, MD as Consulting Physician (Radiation Oncology) Jalene Mullet, MD as Consulting Physician (Ophthalmology) OTHER MD: Annette Stable [phone])  CHIEF COMPLAINT:  Stage IV breast cancer, estrogen receptor positive (s/p bilateral mastectomies)  CURRENT TREATMENT:  anastrozole, palbociclib, denosumab/Xgeva   INTERVAL HISTORY: [name] returns today for follow up of her stage IV breast cancer.  Since her last visit, she underwent follow up brain and orbits MRI on 01/27/2021 showing: diminished infiltrative left orbital metastasis from initial 03/2020 scan; negative for brain metastasis, known osseous metastatic disease.  She continues on anastrozole, which she started 05/01/2020.   She also continues on palbociclib.  Because of low counts we have dropped the dose to 75 mg daily.    We are following her CA 27-29.  This has normalized Lab Results  Component Value Date   CA2729 13.0 03/18/2021   CA2729 19.7 02/18/2021   CA2729 18.1 01/18/2021   CA2729 19.2 12/17/2020   CA2729 17.8 11/22/2020    REVIEW OF SYSTEMS: Review of Systems - Oncology    COVID 19 VACCINATION STATUS: Not vaccinated as of 05/24/2020   BREAST CANCER HISTORY:  From the original intake note:  [name] had bilateral diagnostic mammography [facility] in [place] 11/30/2009 showing a 1.5 cm mass at the 9:00 position of the left breast with some satellites. A biopsy of the mass in question [facility]  showed ([id number]) and invasive lobular carcinoma, which was estrogen and progesterone receptor both 100% positive, both with strong staining intensity, but HER-2 negative at 1+. Bilateral breast MRI 12/13/2009 in [place] showed in the left breast an irregularly marginated mass measuring 3.2 cm. There were 4 or 5 separate satellite lesions laterally and superior to the primary.  Accordingly, after appropriate discussion, the patient underwent bilateral mastectomies with left sentinel lymph node sampling 01/02/2010. The right breast was benign. The left breast showed a 2.3 cm invasive lobular carcinoma, grade 3, with a total of 6 sentinel lymph nodes removed, all negative, although 2 showed isolated tumor cells by immunostaining. Margins were negative.  An Oncotype BX was sent, with a recurrence score of 22, predicting a risk of distant recurrence within 10 years with 14% of the patients only systemic treatment was tamoxifen. The patient was treated with CMF chemotherapy between 02/22/2010 and 07/05/2010, receiving 7 cycles at which point the patient refused further chemotherapy though there have been no major toxicities at according to the oncology note from Dr. Brent General. The patient was then started on tamoxifen 08/09/2010. By her cancer took approximately 12-15 months then stopped because of hot flashes and insomnia problems.  More recently, the patient noted a change in her left axilla andunderwent bilateral breast MRI January to 2015 at [facility]. The patient has bilateral submuscular silicone implants in place. The breast were unremarkable, but there were several lymph nodes with cortical thickening in the left axilla including a dominant 1 measuring 1.2 cm in the short axis. Ultrasonography of this area identified the lymph node in question and the patient underwent biopsy of the left axillary lymph node This showed ([id number]  [id number]) near-total replacement of the lymph node by metastatic carcinoma.  This was 96% estrogen receptor positive, and 84% progesterone receptor positive, both with strong staining intensity. The MIB-1 was 20%. HER-2 determination is pending.  The patient's subsequent history is as detailed below   PAST MEDICAL HISTORY: Past Medical History:  Diagnosis Date   Back muscle spasm    occasional tx with skelaxin   GERD (gastroesophageal reflux disease)    Radiation 10/24/13-12/09/13   Left chestwall/supraclav./scar    PAST SURGICAL HISTORY: Past Surgical History:  Procedure Laterality Date   AXILLARY LYMPH NODE DISSECTION Left 06/16/2013   Procedure: LEFT AXILLARY NODE DISSECTION;  Surgeon: Shann Medal, MD;  Location: [facility];  Service: General;  Laterality: Left;   BILATERAL TOTAL MASTECTOMY WITH AXILLARY LYMPH NODE DISSECTION     BREAST IMPLANT REMOVAL Bilateral 03/18/2018   Procedure: REMOVAL BREAST IMPLANTS;  Surgeon: Wallace Going, DO;  Location: [facility];  Service: Plastics;  Laterality: Bilateral;   BREAST SURGERY     CAPSULECTOMY Bilateral 03/18/2018   Procedure: CAPSULECTOMY;  Surgeon: Wallace Going, DO;  Location: [facility];  Service: Plastics;  Laterality: Bilateral;   LAPAROSCOPIC BILATERAL SALPINGO OOPHERECTOMY Bilateral 01/25/2014   Procedure: LAPAROSCOPIC BILATERAL SALPINGO OOPHORECTOMY;  Surgeon: Eldred Manges, MD;  Location: [facility];  Service: Gynecology;  Laterality: Bilateral;   LEEP     PLACEMENT OF BREAST IMPLANTS Bilateral 03/18/2018   Procedure: PLACEMENT OF BREAST IMPLANTS;  Surgeon: Wallace Going, DO;  Location: [facility];  Service: Plastics;  Laterality: Bilateral;   PORT-A-CATH REMOVAL N/A 01/25/2014   Procedure: REMOVAL PORT-A-CATH;  Surgeon: Alphonsa Overall, MD;  Location: [facility];  Service: General;  Laterality: N/A;   PORTACATH PLACEMENT N/A 06/16/2013   Procedure: POWER PORT PLACEMENT;  Surgeon: Shann Medal, MD;  Location: [facility];  Service: General;  Laterality:  N/A;   right leg surgery     broken femur - has rod in leg   TONSILLECTOMY     WISDOM TOOTH EXTRACTION      FAMILY HISTORY Family History  Problem Relation Age of Onset   Other Mother 71       glioblastoma; deceased 86   Cancer Mother    Other Father 79       glioblastoma; deceased 79   Cancer Father    Hypertension Other    according to the patient both her parents died from a primary brain cancers, namely we have the stomas, her father at 44, her mother at 70. The patient had one brother and one sister. There is no history of breast or ovarian cancer in the family   GYNECOLOGIC HISTORY:   (Reviewed 10/10/2013) Menarche age 67. The patient has never carried a child to term. She was still having regular periods at the time of her recent diagnosis. LMP 07/18/2013 as of 09/08/2013.  The patient took oral contraceptives between the ages of 79 and 17 with no complications. She stopped having periods with her chemotherapy in 2015.    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 She currently works from [employer] for [employer] Kitchen  Her husband Burnice Logan works at [employer] and also in [place]: In the absence of any documents to the contrary the patient's husband is her healthcare power  of attorney   HEALTH MAINTENANCE:  Social History   Tobacco Use   Smoking status: Never   Smokeless tobacco: Never  Substance Use Topics   Alcohol use: Yes    Comment: socially wine   Drug use: No     Colonoscopy: 08/2020 (Dr. Collene Mares), recall not indicated  PAP: December 2014  Bone density: Never  Lipid panel: Not on file   Allergies  Allergen Reactions   Sulfa Antibiotics Hives and Itching   Gadolinium Derivatives Hives    After gado injection, pt had a few small hives on left breast area. Treated with  benadryl by dr Janeece Fitting    Petrolatum Hives and Rash    Current Outpatient Medications  Medication Sig Dispense Refill   anastrozole (ARIMIDEX) 1 MG tablet Take 1 tablet (1 mg total) by mouth daily. 90 tablet 4   azithromycin (ZITHROMAX) 250 MG tablet Take as directed 6 each 0   LORazepam (ATIVAN) 0.5 MG tablet Take 1 tablet (0.5 mg total) by mouth every 8 (eight) hours. 30 tablet 0   palbociclib (IBRANCE) 75 MG tablet Take 1 tablet (75 mg total) by mouth daily. Take for 21 days on, 7 days off, repeat every 28 days. 21 tablet 6   No current facility-administered medications for this visit.    OBJECTIVE: African-American woman in no acute distress  Vitals:   04/15/21 1355  BP: 129/74  Pulse: 89  Resp: 18  Temp: 97.7 F (36.5 C)  SpO2: 100%   Wt Readings from Last 3 Encounters:  04/15/21 222 lb 9 oz (101 kg)  02/18/21 218 lb 8 oz (99.1 kg)  12/17/20 214 lb 12.8 oz (97.4 kg)   Body mass index is 33.84 kg/m.    ECOG FS:1 - Symptomatic but completely ambulatory GENERAL: Patient is a well appearing female in no acute distress HEENT:  Sclerae anicteric.  Oropharynx clear and moist. No ulcerations or evidence of oropharyngeal candidiasis. Neck is supple.  NODES:  No cervical, supraclavicular, or axillary lymphadenopathy palpated.  BREAST EXAM:  Deferred. LUNGS:  Clear to auscultation bilaterally.  No wheezes or rhonchi. HEART:  Regular rate and rhythm. No murmur appreciated. ABDOMEN:  Soft, nontender.  Positive, normoactive bowel sounds. No organomegaly palpated. MSK:  No focal spinal tenderness to palpation. Full range of motion bilaterally in the upper extremities. EXTREMITIES:  No peripheral edema.   SKIN:  Clear with no obvious rashes or skin changes. No nail dyscrasia. NEURO:  Nonfocal. Well oriented.  Appropriate affect.    LAB RESULTS: Lab Results  Component Value Date   WBC 2.6 (L) 04/15/2021   NEUTROABS 0.9 (L) 04/15/2021   HGB 10.6 (L) 04/15/2021   HCT 32.6  (L) 04/15/2021   MCV 90.8 04/15/2021   PLT 242 04/15/2021      Chemistry      Component Value Date/Time   NA 140 03/18/2021 1450   NA 141 10/30/2016 0813   K 4.1 03/18/2021 1450   K 4.0 10/30/2016 0813   CL 109 03/18/2021 1450   CO2 22 03/18/2021 1450   CO2 26 10/30/2016 0813   BUN 14 03/18/2021 1450   BUN 15.2 10/30/2016 0813   CREATININE 0.80 03/18/2021 1450   CREATININE 0.8 10/30/2016 0813      Component Value Date/Time   CALCIUM 10.1 03/18/2021 1450   CALCIUM 10.0 10/30/2016 0813   ALKPHOS 79 03/18/2021 1450   ALKPHOS 59 10/30/2016 0813   AST 18 03/18/2021 1450   AST 17 10/30/2016 0813   ALT  14 03/18/2021 1450   ALT 11 10/30/2016 0813   BILITOT <0.2 (L) 03/18/2021 1450   BILITOT 0.26 10/30/2016 0813      STUDIES: No results found.    ASSESSMENT: 45 y.o. BRCA negative [name], [name] woman  (1) status post bilateral mastectomies with left axillary lymph node sampling [6 nodes removed] 01/02/2010 for a pT2 pN0(i+), stage IIA invasive lobular breast cancer, grade 3, estrogen and progesterone receptor positive, HER-2 not amplified  (a) right breast was benign  (2) Oncotype DX score of 22 predicted a 14% risk of distant recurrence within 10 years if the patient's only systemic treatment is tamoxifen for 5 years  (3) status post CMF(cyclophosphamide, fluorouracil, methotrexate) x7 given between October of 2011 and March of 2012 (7 of 8 planned treatments completed)  (4) tamoxifen started April 2012, discontinued approximately July of 2013 (about 15 months) because of problems with hot flashes and insomnia  (5) pathologically documented left axillary recurrence 05/05/2013, the tumor being again estrogen and progesterone receptor positive, with HER-2 not amplified, and MIB-1 of 15%. Staging CT/PET 05/27/2013 show left axillary and supraclavicular recurrence, no distant disease  (6) status post completion left axillary lymph node dissection 06/16/2013 showing a total of  10/15 lymph nodes involved by tumor, with evidence of extracapsular extension (TX N3 = stage IIIC)  (7) treated with carboplatin/ docetaxel, first dose on 07/11/2013, repeated every 21 days x 4 with Neulasta support on day 2.  Final dose was given on 09/12/2013, with chemotherapy discontinued after 4 cycles due to continuing low counts.  (8) radiation completed 12/09/2013: Left chest wall / 50.4 Gray @ 1.8 Gray per fraction x 28 fractions Left Supraclavicular fossa and PAB/ 45 Gray _0 .8 Gray per fraction x 25 fractions Left scar / 10 Gray at [employer] per fraction x 5 fractions   (9) bilateral salpingo-oophorectomy on 01/25/14  (10)   lymphedema and limited range of motion in the left upper extremity secondary to left axillary lymph node dissection, being treated through the lymphedema clinic  - improved   (13) sequencing and deletion/duplication analysis of 17 genes performed September 2015 at [facility] showed no deleterious mutations in  ATM, BARD1, BRCA1, BRCA2, BRIP1, CDH1, CHEK2, MRE11A, MUTYH, NBN, NF1, PALB2, PTEN, RAD50, RAD51C, RAD51D, and TP53.  (14) tamoxifen re-started 02/27/2014, discontinued December 2021 with progression  (15) bilateral breast implant exchange with capsulotomies 03/18/2018 following left implant rupture and bilateral capsular contractures. Left - [employer]or Smooth Round Ultra High Profile Gel 590cc. Ref #[id number].  Serial Number [id number] Right - [employer]or Smooth Round Ultra High Profile Gel 590cc. Ref #[id number].  Serial Number [id number]  METASTATIC DISEASE: December 2021 (16) bone marrow biopsy 04/23/2020 shows metastatic carcinoma, estrogen and progesterone receptor positive  (a) MRI of the cervical spine 04/07/2020 shows markedly abnormal marrow signal, prominent right axillary lymph nodes, degenerative disc disease  (b) MRI of the orbits 04/13/2020 suggests metastatic disease around the left orbit and diffusely abnormal bone marrow signal  (c) CT scan of  the chest, abdomen and pelvis 05/10/2020 shows extensive adenopathy (right axillary, prevascular, right hilar, celiac and portacaval axes), multiple liver masses, multiple sclerotic bone lesions  (d) bone scan 05/10/2020 shows uptake through the pelvis spine sternum and ribs and possibly the right mandible.  Consistent with widespread bony metastatic disease  (e) brain MRI 05/09/2020 shows no evidence of intracranial metastases, with left periorbital disease as previously documented  (f) CA 27-29 is informative (was 155.5 05/11/2020)  (17) radiation to the left periorbital  region completed 05/31/2020  (a) repeat brain MRI 10/20/2020 shows no evidence of intracranial metastases.  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  (e) brain and orbital MRI 01/27/2021 shows no brain lesions, stable to slightly improved periorbital disease  (19) denosumab/ Xgeva started 07/05/2020, repeated every 28 d   PLAN: [name] is here today for follow-up of her metastatic breast cancer.  She continues to tolerate her treatment with anastrozole, palbociclib, and Xgeva quite well.  She has no clinical signs of progression of her cancer.  We reviewed her labs today which remained stable.  She will continue on her current therapy.  [name] notes that she is sad because of Dr. Virgie Dad retirement.  She says that she has done some research on the different oncologist and has chosen to follow-up with Dr. Lindi Adie.  I reviewed her history with Dr. Payton Mccallum and he would like to repeat imaging prior to her follow-up with him in 4 weeks.  I  reviewed this with [name] and she is in agreement.  I have placed orders for CT chest abdomen pelvis, bone scan, and MRI orbits.  She will return in 4 weeks for labs, f/u with Dr. Lindi Adie, and her injection.    She knows to call for any questions that may arise between now and her next appointment.  We are happy to see her sooner if needed.   Total encounter time 30 minutes.*In face-to-face visit time, chart review, lab review, care coordination, and documentation of the encounter.   Wilber Bihari, NP 04/15/21 2:04 PM Medical Oncology and Hematology [facility] [place], [place] [address] Tel. [phone]    Fax. [phone]    *Total Encounter Time as defined by the Centers for Medicare and Medicaid Services includes, in addition to the face-to-face time of a patient visit (documented in the note above) non-face-to-face time: obtaining and reviewing outside history, ordering and reviewing medications, tests or procedures, care coordination (communications with other health care professionals or caregivers) and documentation in the medical record.

## 2021-04-15 NOTE — Patient Instructions (Signed)
Denosumab injection What is this medication? DENOSUMAB (den oh sue mab) slows bone breakdown. Prolia is used to treat osteoporosis in women after menopause and in men, and in people who are taking corticosteroids for 6 months or more. Delton See is used to treat a high calcium level due to cancer and to prevent bone fractures and other bone problems caused by multiple myeloma or cancer bone metastases. Delton See is also used to treat giant cell tumor of the bone. This medicine may be used for other purposes; ask your health care provider or pharmacist if you have questions. COMMON BRAND NAME(S): Prolia, XGEVA What should I tell my care team before I take this medication? They need to know if you have any of these conditions: dental disease having surgery or tooth extraction infection kidney disease low levels of calcium or Vitamin D in the blood malnutrition on hemodialysis skin conditions or sensitivity thyroid or parathyroid disease an unusual reaction to denosumab, other medicines, foods, dyes, or preservatives pregnant or trying to get pregnant breast-feeding How should I use this medication? This medicine is for injection under the skin. It is given by a health care professional in a hospital or clinic setting. A special MedGuide will be given to you before each treatment. Be sure to read this information carefully each time. For Prolia, talk to your pediatrician regarding the use of this medicine in children. Special care may be needed. For Delton See, talk to your pediatrician regarding the use of this medicine in children. While this drug may be prescribed for children as young as 13 years for selected conditions, precautions do apply. Overdosage: If you think you have taken too much of this medicine contact a poison control center or emergency room at once. NOTE: This medicine is only for you. Do not share this medicine with others. What if I miss a dose? It is important not to miss your dose.  Call your doctor or health care professional if you are unable to keep an appointment. What may interact with this medication? Do not take this medicine with any of the following medications: other medicines containing denosumab This medicine may also interact with the following medications: medicines that lower your chance of fighting infection steroid medicines like prednisone or cortisone This list may not describe all possible interactions. Give your health care provider a list of all the medicines, herbs, non-prescription drugs, or dietary supplements you use. Also tell them if you smoke, drink alcohol, or use illegal drugs. Some items may interact with your medicine. What should I watch for while using this medication? Visit your doctor or health care professional for regular checks on your progress. Your doctor or health care professional may order blood tests and other tests to see how you are doing. Call your doctor or health care professional for advice if you get a fever, chills or sore throat, or other symptoms of a cold or flu. Do not treat yourself. This drug may decrease your body's ability to fight infection. Try to avoid being around people who are sick. You should make sure you get enough calcium and vitamin D while you are taking this medicine, unless your doctor tells you not to. Discuss the foods you eat and the vitamins you take with your health care professional. See your dentist regularly. Brush and floss your teeth as directed. Before you have any dental work done, tell your dentist you are receiving this medicine. Do not become pregnant while taking this medicine or for 5 months after  stopping it. Talk with your doctor or health care professional about your birth control options while taking this medicine. Women should inform their doctor if they wish to become pregnant or think they might be pregnant. There is a potential for serious side effects to an unborn child. Talk to  your health care professional or pharmacist for more information. What side effects may I notice from receiving this medication? Side effects that you should report to your doctor or health care professional as soon as possible: allergic reactions like skin rash, itching or hives, swelling of the face, lips, or tongue bone pain breathing problems dizziness jaw pain, especially after dental work redness, blistering, peeling of the skin signs and symptoms of infection like fever or chills; cough; sore throat; pain or trouble passing urine signs of low calcium like fast heartbeat, muscle cramps or muscle pain; pain, tingling, numbness in the hands or feet; seizures unusual bleeding or bruising unusually weak or tired Side effects that usually do not require medical attention (report to your doctor or health care professional if they continue or are bothersome): constipation diarrhea headache joint pain loss of appetite muscle pain runny nose tiredness upset stomach This list may not describe all possible side effects. Call your doctor for medical advice about side effects. You may report side effects to FDA at 1-800-FDA-1088. Where should I keep my medication? This medicine is only given in a clinic, doctor's office, or other health care setting and will not be stored at home. NOTE: This sheet is a summary. It may not cover all possible information. If you have questions about this medicine, talk to your doctor, pharmacist, or health care provider.  2022 Elsevier/Gold Standard (2017-08-21 00:00:00)

## 2021-04-16 ENCOUNTER — Encounter: Payer: Self-pay | Admitting: Oncology

## 2021-04-16 LAB — CANCER ANTIGEN 27.29: CA 27.29: 13.2 U/mL (ref 0.0–38.6)

## 2021-04-23 ENCOUNTER — Encounter: Payer: Self-pay | Admitting: Oncology

## 2021-04-24 ENCOUNTER — Other Ambulatory Visit (HOSPITAL_COMMUNITY): Payer: Self-pay

## 2021-05-08 ENCOUNTER — Ambulatory Visit
Admission: RE | Admit: 2021-05-08 | Discharge: 2021-05-08 | Disposition: A | Discharge status: Home / Self Care | Payer: BC Managed Care – PPO | Source: Ambulatory Visit | Attending: Adult Health | Admitting: Adult Health

## 2021-05-08 ENCOUNTER — Encounter: Payer: Self-pay | Admitting: Hematology and Oncology

## 2021-05-08 ENCOUNTER — Other Ambulatory Visit: Payer: Self-pay

## 2021-05-08 ENCOUNTER — Encounter: Payer: Self-pay | Admitting: *Deleted

## 2021-05-08 DIAGNOSIS — Z853 Personal history of malignant neoplasm of breast: Secondary | ICD-10-CM | POA: Diagnosis not present

## 2021-05-08 DIAGNOSIS — C771 Secondary and unspecified malignant neoplasm of intrathoracic lymph nodes: Secondary | ICD-10-CM

## 2021-05-08 DIAGNOSIS — C7949 Secondary malignant neoplasm of other parts of nervous system: Secondary | ICD-10-CM | POA: Diagnosis not present

## 2021-05-08 DIAGNOSIS — C7951 Secondary malignant neoplasm of bone: Secondary | ICD-10-CM

## 2021-05-08 DIAGNOSIS — C787 Secondary malignant neoplasm of liver and intrahepatic bile duct: Secondary | ICD-10-CM

## 2021-05-08 MED ORDER — GADOBENATE DIMEGLUMINE 529 MG/ML IV SOLN
20.0000 mL | Freq: Once | INTRAVENOUS | Status: AC | PRN
  Administered 2021-05-08: 20 mL via INTRAVENOUS

## 2021-05-12 ENCOUNTER — Other Ambulatory Visit: Payer: BC Managed Care – PPO

## 2021-05-16 ENCOUNTER — Other Ambulatory Visit: Payer: Self-pay | Admitting: *Deleted

## 2021-05-16 ENCOUNTER — Ambulatory Visit (HOSPITAL_COMMUNITY)
Admission: RE | Admit: 2021-05-16 | Discharge: 2021-05-16 | Disposition: A | Discharge status: Home / Self Care | Payer: BC Managed Care – PPO | Source: Ambulatory Visit | Attending: Adult Health | Admitting: Adult Health

## 2021-05-16 ENCOUNTER — Other Ambulatory Visit: Payer: Self-pay

## 2021-05-16 ENCOUNTER — Encounter: Payer: Self-pay | Admitting: Adult Health

## 2021-05-16 DIAGNOSIS — C787 Secondary malignant neoplasm of liver and intrahepatic bile duct: Secondary | ICD-10-CM | POA: Diagnosis not present

## 2021-05-16 DIAGNOSIS — C7951 Secondary malignant neoplasm of bone: Secondary | ICD-10-CM | POA: Diagnosis not present

## 2021-05-16 DIAGNOSIS — C50919 Malignant neoplasm of unspecified site of unspecified female breast: Secondary | ICD-10-CM | POA: Diagnosis not present

## 2021-05-16 DIAGNOSIS — C771 Secondary and unspecified malignant neoplasm of intrathoracic lymph nodes: Secondary | ICD-10-CM

## 2021-05-16 DIAGNOSIS — C50911 Malignant neoplasm of unspecified site of right female breast: Secondary | ICD-10-CM | POA: Insufficient documentation

## 2021-05-16 DIAGNOSIS — Z170 Estrogen receptor positive status [ER+]: Secondary | ICD-10-CM

## 2021-05-16 DIAGNOSIS — K8020 Calculus of gallbladder without cholecystitis without obstruction: Secondary | ICD-10-CM | POA: Diagnosis not present

## 2021-05-16 DIAGNOSIS — C50812 Malignant neoplasm of overlapping sites of left female breast: Secondary | ICD-10-CM

## 2021-05-16 DIAGNOSIS — R590 Localized enlarged lymph nodes: Secondary | ICD-10-CM | POA: Diagnosis not present

## 2021-05-16 MED ORDER — PALBOCICLIB 75 MG PO TABS: 75.0000 mg | ORAL_TABLET | Freq: Every day | ORAL | 6 refills | Status: DC

## 2021-05-16 MED ORDER — SODIUM CHLORIDE (PF) 0.9 % IJ SOLN
INTRAMUSCULAR | Status: AC
  Filled 2021-05-16: qty 50

## 2021-05-16 MED ORDER — TECHNETIUM TC 99M MEDRONATE IV KIT
20.0000 | PACK | Freq: Once | INTRAVENOUS | Status: AC | PRN
  Administered 2021-05-16: 20 via INTRAVENOUS

## 2021-05-16 MED ORDER — IOHEXOL 300 MG/ML  SOLN
100.0000 mL | Freq: Once | INTRAMUSCULAR | Status: AC | PRN
  Administered 2021-05-16: 100 mL via INTRAVENOUS

## 2021-05-17 ENCOUNTER — Other Ambulatory Visit: Payer: BC Managed Care – PPO

## 2021-05-21 ENCOUNTER — Other Ambulatory Visit: Payer: Self-pay

## 2021-05-21 DIAGNOSIS — C771 Secondary and unspecified malignant neoplasm of intrathoracic lymph nodes: Secondary | ICD-10-CM

## 2021-05-21 NOTE — Progress Notes (Signed)
Patient Care Team: Stacy Lose, MD as PCP - General (Hematology and Oncology) Gavin Pound, MD as Consulting Physician (Rheumatology) Katy Fitch, Darlina Guys, MD as Consulting Physician (Ophthalmology) Kyung Rudd, MD as Consulting Physician (Radiation Oncology) Jalene Mullet, MD as Consulting Physician (Ophthalmology) Stacy Lose, MD as Consulting Physician (Hematology and Oncology)  DIAGNOSIS:    ICD-10-CM   1. 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 ([place])  C50.911    C77.1       SUMMARY OF ONCOLOGIC HISTORY: Oncology History  Breast cancer metastasized to intrathoracic lymph node ([place])  01/02/2010 Initial Biopsy   bilateral mastectomies with left axillary lymph node sampling [6 nodes removed] 01/02/2010 for a pT2 pN0(i+), stage IIA invasive lobular breast cancer, grade 3, estrogen and progesterone receptor positive, HER-2 not amplified   2011 Oncotype testing   Oncotype DX score of 22 predicted a 14% risk of distant recurrence within 10 years if the patient's only systemic treatment is tamoxifen for 5 years   01/2010 - 06/2010 Chemotherapy   CMF(cyclophosphamide, fluorouracil, methotrexate) x7 given between October of 2011 and March of 2012 (7 of 8 planned treatments completed)   07/2010 - 10/2011 Anti-estrogen oral therapy   tamoxifen started April 2012, discontinued approximately July of 2013 (about 15 months) because of problems with hot flashes and insomnia   05/11/2013 Initial Diagnosis   pathologically documented left axillary recurrence 05/05/2013, the tumor being again estrogen and progesterone receptor positive, with HER-2 not amplified, and MIB-1 of 15%. Staging CT/PET 05/27/2013 show left axillary and supraclavicular recurrence, no distant disease   06/16/2013 Surgery   completion left axillary lymph node dissection 06/16/2013 showing a total of 10/15 lymph nodes involved by tumor, with evidence of extracapsular extension (TX N3 = stage  IIIC)   07/11/2013 - 09/12/2013 Chemotherapy   Carboplatin/ docetaxel, first dose on 07/11/2013, repeated every 21 days x 4 with Neulasta support on day 2.  Final dose was given on 09/12/2013, with chemotherapy discontinued after 4 cycles due to continuing low counts   10/2013 - 12/09/2013 Radiation Therapy   Left chest wall / 50.4 Gray @ 1.8 [place] Cables per fraction x 28 fractions Left Supraclavicular fossa and PAB/ 27 Gray _0 .8 Gray per fraction x 25 fractions Left scar / 10 Gray at [employer] per fraction x 5 fractions       01/25/2014 Surgery   bilateral salpingo-oophorectomy on 01/25/14   12/2013 Miscellaneous   sequencing and deletion/duplication analysis of 17 genes performed September 2015 at [facility] showed no deleterious mutations in  ATM, BARD1, BRCA1, BRCA2, BRIP1, CDH1, CHEK2, MRE11A, MUTYH, NBN, NF1, PALB2, PTEN, RAD50, RAD51C, RAD51D, and TP53   02/27/2014 - 03/2020 Anti-estrogen oral therapy   tamoxifen re-started 02/27/2014, discontinued December 2021 with progression   04/23/2020 Relapse/Recurrence   bone marrow biopsy 04/23/2020 shows metastatic carcinoma, estrogen and progesterone receptor positive             (a) MRI of the cervical spine 04/07/2020 shows markedly abnormal marrow signal, prominent right axillary lymph nodes, degenerative disc disease             (b) MRI of the orbits 04/13/2020 suggests metastatic disease around the left orbit and diffusely abnormal bone marrow signal             (c) CT scan of the chest, abdomen and pelvis 05/10/2020 shows extensive adenopathy (right axillary, prevascular, right hilar, celiac and portacaval axes), multiple liver masses, multiple sclerotic bone lesions   04/2020 - 05/31/2020 Radiation  Therapy   radiation to the left periorbital region completed 05/31/2020   05/01/2020 -  Anti-estrogen oral therapy   anastrozole started 05/01/2020 palbociclib started 05/11/2020 denosumab/ Xgeva started 07/05/2020     CHIEF COMPLIANT: Follow-up of  stage IV breast cancer, to establish oncology care with me  INTERVAL HISTORY: [name] is a 46 y.o. with above-mentioned history of stage IV breast cancer, estrogen receptor positive, currently on anastrozole. She presents to the clinic today for follow-up.  Her history has been thoroughly reviewed and documented as above.  She has been on Ibrance and anastrozole for the past year.  Other than hot flashes to anastrozole she is doing quite well.  Denies any fevers or chills.  ALLERGIES:  is allergic to sulfa antibiotics, gadolinium derivatives, and petrolatum.  MEDICATIONS:  Current Outpatient Medications  Medication Sig Dispense Refill   anastrozole (ARIMIDEX) 1 MG tablet Take 1 tablet (1 mg total) by mouth daily. 90 tablet 4   azithromycin (ZITHROMAX) 250 MG tablet Take as directed 6 each 0   LORazepam (ATIVAN) 0.5 MG tablet Take 1 tablet (0.5 mg total) by mouth every 8 (eight) hours. 30 tablet 0   palbociclib (IBRANCE) 75 MG tablet Take 1 tablet (75 mg total) by mouth daily. Take for 21 days on, 7 days off, repeat every 28 days. 21 tablet 6   No current facility-administered medications for this visit.    PHYSICAL EXAMINATION: ECOG PERFORMANCE STATUS: 1 - Symptomatic but completely ambulatory  Vitals:   05/22/21 1122  BP: (!) 154/87  Pulse: (!) 103  Resp: 18  Temp: (!) 97.3 F (36.3 C)  SpO2: 100%   Filed Weights   05/22/21 1122  Weight: 224 lb 8 oz (101.8 kg)      LABORATORY DATA:  I have reviewed the data as listed CMP Latest Ref Rng & Units 05/22/2021 04/15/2021 03/18/2021  Glucose 70 - 99 mg/dL 135(H) 137(H) 85  BUN 6 - 20 mg/dL _0 Creatinine 0.44 - 1.00 mg/dL 0.96 0.93 0.80  Sodium 135 - 145 mmol/L 140 142 140  Potassium 3.5 - 5.1 mmol/L 4.0 3.7 4.1  Chloride 98 - 111 mmol/L 105 108 109  CO2 22 - 32 mmol/L _1 Calcium 8.9 - 10.3 mg/dL 10.3 9.5 10.1  Total Protein 6.5 - 8.1 g/dL 8.0 7.7 8.1  Total Bilirubin 0.3 - 1.2 mg/dL 0.4 0.3 <0.2(L)   Alkaline Phos 38 - 126 U/L 67 69 79  AST 15 - 41 U/L _2 ALT 0 - 44 U/L _3 Lab Results  Component Value Date   WBC 2.4 (L) 05/22/2021   HGB 11.2 (L) 05/22/2021   HCT 35.6 (L) 05/22/2021   MCV 91.5 05/22/2021   PLT 183 05/22/2021   NEUTROABS 0.8 (L) 05/22/2021    ASSESSMENT & PLAN:  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  We will keep the dose of the same even though ANC today 0.8.  Today is her last day of her Ibrance.   Scans:  1. 05/08/21: Unchanged appearance of the ST mass in left orbit, diffuse BM uptake 2. 05/16/21: CT CAP: Stable diffuse bone mets, dec in size of liver lesion, no residual measurable celiac axis LN 3. 05/16/21: Bone scan: Stable bone mets  Plan to repeat scans in 6 months. We will move her Xgeva to  every 3 months.  We will cancel today's Xgeva injection. She will come back in 2 months with labs injection and follow-up with me.  No orders of the defined types were placed in this encounter.  The patient has a good understanding of the overall plan. she agrees with it. she will call with any problems that may develop before the next visit here.  Total time spent: 60 mins including face to face time and time spent for planning, charting and coordination of care  Rulon Eisenmenger, MD, MPH 05/22/2021  I, Thana Ates, am acting as scribe for Dr. Nicholas Bell.  I have reviewed the above documentation for accuracy and completeness, and I agree with the above.

## 2021-05-21 NOTE — Assessment & Plan Note (Signed)
Metastatic Breast Cancer: Current treatment: Anastrozole with Palbociclib started 05/11/20 Toxicities:  Scans:  1. 05/08/21: Unchanged appearance of the ST mass in left orbit, diffuse BM uptake 2. 05/16/21: CT CAP: Stable diffuse bone mets, dec in size of liver lesion, no residual measurable celiac axis LN 3. 05/16/21: Bone scan: Stable bone mets

## 2021-05-22 ENCOUNTER — Encounter: Payer: Self-pay | Admitting: Hematology and Oncology

## 2021-05-22 ENCOUNTER — Other Ambulatory Visit: Payer: BC Managed Care – PPO

## 2021-05-22 ENCOUNTER — Ambulatory Visit: Payer: BC Managed Care – PPO

## 2021-05-22 ENCOUNTER — Inpatient Hospital Stay: Payer: BC Managed Care – PPO

## 2021-05-22 ENCOUNTER — Other Ambulatory Visit: Payer: Self-pay

## 2021-05-22 ENCOUNTER — Other Ambulatory Visit: Payer: Self-pay | Admitting: *Deleted

## 2021-05-22 ENCOUNTER — Inpatient Hospital Stay: Payer: BC Managed Care – PPO | Attending: Oncology | Admitting: Hematology and Oncology

## 2021-05-22 DIAGNOSIS — C771 Secondary and unspecified malignant neoplasm of intrathoracic lymph nodes: Secondary | ICD-10-CM | POA: Insufficient documentation

## 2021-05-22 DIAGNOSIS — C50911 Malignant neoplasm of unspecified site of right female breast: Secondary | ICD-10-CM | POA: Diagnosis not present

## 2021-05-22 DIAGNOSIS — Z79811 Long term (current) use of aromatase inhibitors: Secondary | ICD-10-CM | POA: Insufficient documentation

## 2021-05-22 DIAGNOSIS — Z9221 Personal history of antineoplastic chemotherapy: Secondary | ICD-10-CM | POA: Insufficient documentation

## 2021-05-22 DIAGNOSIS — D709 Neutropenia, unspecified: Secondary | ICD-10-CM | POA: Diagnosis not present

## 2021-05-22 DIAGNOSIS — Z170 Estrogen receptor positive status [ER+]: Secondary | ICD-10-CM | POA: Insufficient documentation

## 2021-05-22 DIAGNOSIS — Z923 Personal history of irradiation: Secondary | ICD-10-CM | POA: Insufficient documentation

## 2021-05-22 DIAGNOSIS — Z79899 Other long term (current) drug therapy: Secondary | ICD-10-CM | POA: Diagnosis not present

## 2021-05-22 DIAGNOSIS — C7951 Secondary malignant neoplasm of bone: Secondary | ICD-10-CM | POA: Insufficient documentation

## 2021-05-22 DIAGNOSIS — Z9013 Acquired absence of bilateral breasts and nipples: Secondary | ICD-10-CM | POA: Diagnosis not present

## 2021-05-22 DIAGNOSIS — G5600 Carpal tunnel syndrome, unspecified upper limb: Secondary | ICD-10-CM

## 2021-05-22 LAB — CMP (CANCER CENTER ONLY)
ALT: 10 U/L (ref 0–44)
AST: 15 U/L (ref 15–41)
Albumin: 4.5 g/dL (ref 3.5–5.0)
Alkaline Phosphatase: 67 U/L (ref 38–126)
Anion gap: 6 (ref 5–15)
BUN: 15 mg/dL (ref 6–20)
CO2: 29 mmol/L (ref 22–32)
Calcium: 10.3 mg/dL (ref 8.9–10.3)
Chloride: 105 mmol/L (ref 98–111)
Creatinine: 0.96 mg/dL (ref 0.44–1.00)
GFR, Estimated: >60 mL/min (ref >60)
Glucose, Bld: 135 mg/dL — ABNORMAL HIGH (ref 70–99)
Potassium: 4 mmol/L (ref 3.5–5.1)
Sodium: 140 mmol/L (ref 135–145)
Total Bilirubin: 0.4 mg/dL (ref 0.3–1.2)
Total Protein: 8 g/dL (ref 6.5–8.1)

## 2021-05-22 LAB — CBC WITH DIFFERENTIAL (CANCER CENTER ONLY)
Abs Immature Granulocytes: 0 10*3/uL (ref 0.00–0.07)
Basophils Absolute: 0 10*3/uL (ref 0.0–0.1)
Basophils Relative: 2 %
Eosinophils Absolute: 0 10*3/uL (ref 0.0–0.5)
Eosinophils Relative: 2 %
HCT: 35.6 % — ABNORMAL LOW (ref 36.0–46.0)
Hemoglobin: 11.2 g/dL — ABNORMAL LOW (ref 12.0–15.0)
Immature Granulocytes: 0 %
Lymphocytes Relative: 56 %
Lymphs Abs: 1.4 10*3/uL (ref 0.7–4.0)
MCH: 28.8 pg (ref 26.0–34.0)
MCHC: 31.5 g/dL (ref 30.0–36.0)
MCV: 91.5 fL (ref 80.0–100.0)
Monocytes Absolute: 0.1 10*3/uL (ref 0.1–1.0)
Monocytes Relative: 6 %
Neutro Abs: 0.8 10*3/uL — ABNORMAL LOW (ref 1.7–7.7)
Neutrophils Relative %: 34 %
Platelet Count: 183 10*3/uL (ref 150–400)
RBC: 3.89 MIL/uL (ref 3.87–5.11)
RDW: 15.4 % (ref 11.5–15.5)
Smear Review: NORMAL
WBC Count: 2.4 10*3/uL — ABNORMAL LOW (ref 4.0–10.5)
nRBC: 0 % (ref 0.0–0.2)

## 2021-05-22 NOTE — Progress Notes (Signed)
Cancelled pt injection appointment for today, per MD pt will begin receiving xgeva every 3 months now

## 2021-05-23 LAB — CANCER ANTIGEN 27.29: CA 27.29: 20.7 U/mL (ref 0.0–38.6)

## 2021-06-07 ENCOUNTER — Encounter: Payer: Self-pay | Admitting: Orthopedic Surgery

## 2021-06-07 ENCOUNTER — Other Ambulatory Visit: Payer: Self-pay

## 2021-06-07 ENCOUNTER — Ambulatory Visit (INDEPENDENT_AMBULATORY_CARE_PROVIDER_SITE_OTHER): Payer: BC Managed Care – PPO | Admitting: Orthopedic Surgery

## 2021-06-07 DIAGNOSIS — H34812 Central retinal vein occlusion, left eye, with macular edema: Secondary | ICD-10-CM | POA: Diagnosis not present

## 2021-06-07 DIAGNOSIS — M654 Radial styloid tenosynovitis [de Quervain]: Secondary | ICD-10-CM

## 2021-06-07 MED ORDER — LIDOCAINE HCL 1 % IJ SOLN
1.0000 mL | INTRAMUSCULAR | Status: AC | PRN
  Administered 2021-06-07: 1 mL

## 2021-06-07 MED ORDER — BETAMETHASONE SOD PHOS & ACET 6 (3-3) MG/ML IJ SUSP
6.0000 mg | INTRAMUSCULAR | Status: AC | PRN
  Administered 2021-06-07: 6 mg via INTRA_ARTICULAR

## 2021-06-07 NOTE — Progress Notes (Signed)
Office Visit Note   Patient: [name]           Date of Birth: 1976-02-03           MRN: [id number] Visit Date: 06/07/2021              Requested by: Nicholas Lose, MD [place],  [place] [id number] PCP: Nicholas Lose, MD   Assessment & Plan: Visit Diagnoses:  1. De Quervain's disease (tenosynovitis)     Plan: We discussed the diagnosis, prognosis, non-operative and operative treatment options for De Quervain's tenosynovitis.  After our discussion, the patient would like to proceed with corticosteroid injection.  We reviewed the risks and benefits of conservative management.  The patient expressed understanding of the reasoning and strategy going forward.  All patient questions and concerns were addressed.    Follow-Up Instructions: No follow-ups on file.   Orders:  No orders of the defined types were placed in this encounter.  No orders of the defined types were placed in this encounter.     Procedures: Hand/UE Inj: R extensor compartment 1 for de Quervain's tenosynovitis on 06/07/2021 10:48 AM Indications: pain and tendon swelling Details: 25 G needle, radial approach Medications: 1 mL lidocaine 1 %; 6 mg betamethasone acetate-betamethasone sodium phosphate 6 (3-3) MG/ML Outcome: tolerated well, no immediate complications Procedure, treatment alternatives, risks and benefits explained, specific risks discussed. Consent was given by the patient. Patient was prepped and draped in the usual sterile fashion.      Clinical Data: No additional findings.   Subjective: Chief Complaint  Patient presents with   Right Hand - Numbness    This is a 46 year old right-hand-dominant female who presents with pain at the radial aspect of the wrist for the last several months.  The pain seems to be localized to the radial styloid.  It is worse with certain motions of the wrist and thumb.  She denies any numbness or paresthesias in her fingers.  She  is been wearing a wrist brace which helps some.  She wakes up at night with pain at the radial aspect of the wrist around the styloid.   Review of Systems   Objective: Vital Signs: There were no vitals taken for this visit.  Physical Exam Constitutional:      Appearance: Normal appearance.  Cardiovascular:     Rate and Rhythm: Normal rate.     Pulses: Normal pulses.  Pulmonary:     Effort: Pulmonary effort is normal.  Skin:    General: Skin is warm and dry.     Capillary Refill: Capillary refill takes less than 2 seconds.  Neurological:     Mental Status: She is alert.    Right Hand Exam   Tenderness  Right hand tenderness location: TTP at radial styloid along first dorsal compartment.  Other  Erythema: absent Sensation: normal Pulse: present  Comments:  + Finkelstein test.  Negative CMC grind test. Negative Tinel and Phalen at wrist.      Specialty Comments:  No specialty comments available.  Imaging: No results found.   PMFS History: Patient Active Problem List   Diagnosis Date Noted   De Quervain's disease (tenosynovitis) 06/07/2021   Liver metastases ([place]) 05/11/2020   Cancer related pain 05/11/2020   Bone metastases ([place]) 05/03/2020   Goals of care, counseling/discussion 05/03/2020   Elevated sed rate (elev SR) 07/31/2019   History of reconstruction of both breasts 06/29/2018   Acquired absence of breast and absent nipple,  bilateral 02/16/2018   Capsular contracture of breast implant 02/16/2018   Bulge of cervical disc without myelopathy 04/17/2015   Chest skin lesion 09/20/2014   Hot flashes 08/08/2014   Weight gain 08/08/2014   Endometriosis of ovary 01/25/2014   Fibroids, subserous 01/25/2014   Lymphedema of arm 01/09/2014   Chemotherapy induced neutropenia ([place]) 10/10/2013   Lower back pain 10/10/2013   Bilateral lower extremity edema 09/08/2013   Anxiety 09/08/2013   Depression 08/10/2013   Anemia 07/20/2013   Pain in left axilla  07/06/2013   Breast cancer metastasized to intrathoracic lymph node ([place]) 05/11/2013   Past Medical History:  Diagnosis Date   Back muscle spasm    occasional tx with skelaxin   GERD (gastroesophageal reflux disease)    Radiation 10/24/13-12/09/13   Left chestwall/supraclav./scar    Family History  Problem Relation Age of Onset   Other Mother 38       glioblastoma; deceased 38   Cancer Mother    Other Father 37       glioblastoma; deceased 40   Cancer Father    Hypertension Other     Past Surgical History:  Procedure Laterality Date   AXILLARY LYMPH NODE DISSECTION Left 06/16/2013   Procedure: LEFT AXILLARY NODE DISSECTION;  Surgeon: Shann Medal, MD;  Location: [facility];  Service: General;  Laterality: Left;   BILATERAL TOTAL MASTECTOMY WITH AXILLARY LYMPH NODE DISSECTION     BREAST IMPLANT REMOVAL Bilateral 03/18/2018   Procedure: REMOVAL BREAST IMPLANTS;  Surgeon: Wallace Going, DO;  Location: [facility];  Service: Plastics;  Laterality: Bilateral;   BREAST SURGERY     CAPSULECTOMY Bilateral 03/18/2018   Procedure: CAPSULECTOMY;  Surgeon: Wallace Going, DO;  Location: [facility];  Service: Plastics;  Laterality: Bilateral;   LAPAROSCOPIC BILATERAL SALPINGO OOPHERECTOMY Bilateral 01/25/2014   Procedure: LAPAROSCOPIC BILATERAL SALPINGO OOPHORECTOMY;  Surgeon: Eldred Manges, MD;  Location: [facility];  Service: Gynecology;  Laterality: Bilateral;   LEEP     PLACEMENT OF BREAST IMPLANTS Bilateral 03/18/2018   Procedure: PLACEMENT OF BREAST IMPLANTS;  Surgeon: Wallace Going, DO;  Location: [facility];  Service: Plastics;  Laterality: Bilateral;   PORT-A-CATH REMOVAL N/A 01/25/2014   Procedure: REMOVAL PORT-A-CATH;  Surgeon: Alphonsa Overall, MD;  Location: [facility];  Service: General;  Laterality: N/A;   PORTACATH PLACEMENT N/A 06/16/2013   Procedure: POWER PORT PLACEMENT;  Surgeon: Shann Medal, MD;  Location: [facility];   Service: General;  Laterality: N/A;   right leg surgery     broken femur - has rod in leg   TONSILLECTOMY     WISDOM TOOTH EXTRACTION     Social History   Occupational History    Employer: [employer]  Tobacco Use   Smoking status: Never   Smokeless tobacco: Never  Substance and Sexual Activity   Alcohol use: Yes    Comment: socially wine   Drug use: No   Sexual activity: Yes    Birth control/protection: None

## 2021-06-09 ENCOUNTER — Encounter: Payer: Self-pay | Admitting: Hematology and Oncology

## 2021-06-10 ENCOUNTER — Other Ambulatory Visit: Payer: Self-pay | Admitting: *Deleted

## 2021-06-10 MED ORDER — FAMOTIDINE 20 MG PO TABS: 20.0000 mg | ORAL_TABLET | Freq: Two times a day (BID) | ORAL | 6 refills | Status: DC

## 2021-06-24 ENCOUNTER — Other Ambulatory Visit (HOSPITAL_COMMUNITY): Payer: Self-pay

## 2021-07-09 ENCOUNTER — Other Ambulatory Visit: Payer: Self-pay | Admitting: *Deleted

## 2021-07-09 ENCOUNTER — Encounter: Payer: Self-pay | Admitting: Hematology and Oncology

## 2021-07-09 DIAGNOSIS — C771 Secondary and unspecified malignant neoplasm of intrathoracic lymph nodes: Secondary | ICD-10-CM

## 2021-07-10 NOTE — Progress Notes (Signed)
? ?Patient Care Team: ?Nicholas Lose, MD as PCP - General (Hematology and Oncology) ?Gavin Pound, MD as Consulting Physician (Rheumatology) ?Debbra Riding, MD as Consulting Physician (Ophthalmology) ?Kyung Rudd, MD as Consulting Physician (Radiation Oncology) ?Jalene Mullet, MD as Consulting Physician (Ophthalmology) ?Nicholas Lose, MD as Consulting Physician (Hematology and Oncology) ? ?DIAGNOSIS:  ?Encounter Diagnosis  ?Name Primary?  ? 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 ? ?SUMMARY OF ONCOLOGIC HISTORY: ?Oncology History  ?Breast cancer metastasized to intrathoracic lymph node ([place])  ?01/02/2010 Initial Biopsy  ? bilateral mastectomies with left axillary lymph node sampling [6 nodes removed] 01/02/2010 for a pT2 pN0(i+), stage IIA invasive lobular breast cancer, grade 3, estrogen and progesterone receptor positive, HER-2 not amplified ?  ?2011 Oncotype testing  ? Oncotype DX score of 22 predicted a 14% risk of distant recurrence within 10 years if the patient's only systemic treatment is tamoxifen for 5 years ?  ?01/2010 - 06/2010 Chemotherapy  ? CMF(cyclophosphamide, fluorouracil, methotrexate) x7 given between October of 2011 and March of 2012 (7 of 8 planned treatments completed) ?  ?07/2010 - 10/2011 Anti-estrogen oral therapy  ? tamoxifen started April 2012, discontinued approximately July of 2013 (about 15 months) because of problems with hot flashes and insomnia ?  ?05/11/2013 Initial Diagnosis  ? pathologically documented left axillary recurrence 05/05/2013, the tumor being again estrogen and progesterone receptor positive, with HER-2 not amplified, and MIB-1 of 15%. Staging CT/PET 05/27/2013 show left axillary and supraclavicular recurrence, no distant disease ?  ?06/16/2013 Surgery  ? completion left axillary lymph node dissection 06/16/2013 showing a total of 10/15 lymph nodes involved by tumor, with evidence of extracapsular extension (TX N3 = stage  IIIC) ?  ?07/11/2013 - 09/12/2013 Chemotherapy  ? Carboplatin/ docetaxel, first dose on 07/11/2013, repeated every 21 days x 4 with Neulasta support on day 2.  Final dose was given on 09/12/2013, with chemotherapy discontinued after 4 cycles due to continuing low counts ?  ?10/2013 - 12/09/2013 Radiation Therapy  ? Left chest wall / 50.4 Gray @ 1.8 Gray per fraction x 28 fractions ?Left Supraclavicular fossa and PAB/ 45 Gray _0 .8 Gray per fraction x 25 fractions ?Left scar / 10 Gray at [employer] per fraction x 5 fractions     ?  ?01/25/2014 Surgery  ? bilateral salpingo-oophorectomy on 01/25/14 ?  ?12/2013 Miscellaneous  ? sequencing and deletion/duplication analysis of 17 genes performed September 2015 at [facility] showed no deleterious mutations in  ATM, BARD1, BRCA1, BRCA2, BRIP1, CDH1, CHEK2, MRE11A, MUTYH, NBN, NF1, PALB2, PTEN, RAD50, RAD51C, RAD51D, and TP53 ?  ?02/27/2014 - 03/2020 Anti-estrogen oral therapy  ? tamoxifen re-started 02/27/2014, discontinued December 2021 with progression ?  ?04/23/2020 Relapse/Recurrence  ? bone marrow biopsy 04/23/2020 shows metastatic carcinoma, estrogen and progesterone receptor positive ?            (a) MRI of the cervical spine 04/07/2020 shows markedly abnormal marrow signal, prominent right axillary lymph nodes, degenerative disc disease ?            (b) MRI of the orbits 04/13/2020 suggests metastatic disease around the left orbit and diffusely abnormal bone marrow signal ?            (c) CT scan of the chest, abdomen and pelvis 05/10/2020 shows extensive adenopathy (right axillary, prevascular, right hilar, celiac and portacaval axes), multiple liver masses, multiple sclerotic bone lesions ?  ?04/2020 - 05/31/2020 Radiation Therapy  ? radiation to the left  periorbital region completed 05/31/2020 ?  ?05/01/2020 -  Anti-estrogen oral therapy  ? anastrozole started 05/01/2020 ?palbociclib started 05/11/2020 ?denosumab/ Xgeva started 07/05/2020 ?  ? ? ?CHIEF COMPLIANT: Follow-up of  stage IV breast cancer, to establish oncology care with me ? ?INTERVAL HISTORY: [name] is a 46 y.o. with above-mentioned history of stage IV breast cancer, estrogen receptor positive, currently on anastrozole. She presents to the clinic today for follow-up.  She had a really bad week previously or with severe fatigue that lasted for 3 to 4 days.  She tested for COVID and it was negative.  She is starting to feel better since yesterday.  She is also very depressed because she has been gaining weight in spite of doing everything she can to lose it. ? ? ?ALLERGIES:  is allergic to sulfa antibiotics, gadolinium derivatives, and petrolatum. ? ?MEDICATIONS:  ?Current Outpatient Medications  ?Medication Sig Dispense Refill  ? famotidine (PEPCID) 20 MG tablet Take 1 tablet (20 mg total) by mouth 2 (two) times daily. 60 tablet 6  ? anastrozole (ARIMIDEX) 1 MG tablet Take 1 tablet (1 mg total) by mouth daily. 90 tablet 4  ? LORazepam (ATIVAN) 0.5 MG tablet Take 1 tablet (0.5 mg total) by mouth every 8 (eight) hours. 30 tablet 0  ? palbociclib (IBRANCE) 75 MG tablet Take 1 tablet (75 mg total) by mouth daily. Take for 21 days on, 7 days off, repeat every 28 days. 21 tablet 6  ? ?No current facility-administered medications for this visit.  ? ? ?PHYSICAL EXAMINATION: ?ECOG PERFORMANCE STATUS: 1 - Symptomatic but completely ambulatory ? ?Vitals:  ? 07/15/21 0928  ?BP: 121/87  ?Pulse: 75  ?Resp: 18  ?Temp: (!) 97.5 ?F (36.4 ?C)  ?SpO2: 100%  ? ?Filed Weights  ? 07/15/21 0928  ?Weight: 229 lb 3.2 oz (104 kg)  ?  ? ?LABORATORY DATA:  ?I have reviewed the data as listed ?CMP Latest Ref Rng & Units 07/15/2021 05/22/2021 04/15/2021  ?Glucose 70 - 99 mg/dL 95 135(H) 137(H)  ?BUN 6 - 20 mg/dL _0 ?Creatinine 0.44 - 1.00 mg/dL 0.92 0.96 0.93  ?Sodium 135 - 145 mmol/L 141 140 142  ?Potassium 3.5 - 5.1 mmol/L 3.9 4.0 3.7  ?Chloride 98 - 111 mmol/L 107 105 108  ?CO2 22 - 32 mmol/L _1 ?Calcium 8.9 - 10.3 mg/dL 10.3 10.3  9.5  ?Total Protein 6.5 - 8.1 g/dL 7.9 8.0 7.7  ?Total Bilirubin 0.3 - 1.2 mg/dL 0.3 0.4 0.3  ?Alkaline Phos 38 - 126 U/L 64 67 69  ?AST 15 - 41 U/L _2 ?ALT 0 - 44 U/L _3 ? ? ?Lab Results  ?Component Value Date  ? WBC 2.2 (L) 07/15/2021  ? HGB 11.2 (L) 07/15/2021  ? HCT 35.0 (L) 07/15/2021  ? MCV 91.4 07/15/2021  ? PLT 199 07/15/2021  ? NEUTROABS 0.6 (L) 07/15/2021  ? ? ?ASSESSMENT & PLAN:  ?Breast cancer metastasized to intrathoracic lymph node ([place]) ?Metastatic Breast Cancer: ?Current treatment: Anastrozole with Palbociclib started 05/11/20 ?Toxicities: ?Neutropenia: Currently on palbociclib 75 mg dose.  ANC today 0.6.  I instructed her to stop Ibrance at this time.  She will now receive Ibrance 2 weeks on 2 weeks off. ?Weight gain: I discussed with her about documenting her food intake with the help of an application like my plate. ?  ?Scans:  ?1. 05/08/21: Unchanged appearance of the ST mass in left orbit, diffuse BM uptake ?  2. 05/16/21: CT CAP: Stable diffuse bone mets, dec in size of liver lesion, no residual measurable celiac axis LN ?3. 05/16/21: Bone scan: Stable bone mets ?  ?Plan to repeat scans in 6 months. ?We will move her Xgeva to every 3 months.  ?She will come back in 3 months with labs injection and scans and follow-up with me. ? ? ? ?No orders of the defined types were placed in this encounter. ? ?The patient has a good understanding of the overall plan. she agrees with it. she will call with any problems that may develop before the next visit here. ?Total time spent: 30 mins including face to face time and time spent for planning, charting and co-ordination of care ? ? Harriette Ohara, MD ?07/15/21 ? ? ? I, Gardiner Coins, am acting as a scribe for Dr. Lindi Adie  ?

## 2021-07-12 ENCOUNTER — Other Ambulatory Visit: Payer: Self-pay | Admitting: *Deleted

## 2021-07-12 DIAGNOSIS — C50919 Malignant neoplasm of unspecified site of unspecified female breast: Secondary | ICD-10-CM

## 2021-07-15 ENCOUNTER — Inpatient Hospital Stay (HOSPITAL_BASED_OUTPATIENT_CLINIC_OR_DEPARTMENT_OTHER): Payer: BC Managed Care – PPO | Admitting: Hematology and Oncology

## 2021-07-15 ENCOUNTER — Encounter: Payer: Self-pay | Admitting: Hematology and Oncology

## 2021-07-15 ENCOUNTER — Inpatient Hospital Stay: Payer: BC Managed Care – PPO | Attending: Oncology

## 2021-07-15 ENCOUNTER — Inpatient Hospital Stay: Payer: BC Managed Care – PPO

## 2021-07-15 ENCOUNTER — Other Ambulatory Visit: Payer: Self-pay

## 2021-07-15 VITALS — BP 121/87 | HR 75 | Temp 97.5°F | Resp 18 | Ht 68.0 in | Wt 229.2 lb

## 2021-07-15 DIAGNOSIS — Z170 Estrogen receptor positive status [ER+]: Secondary | ICD-10-CM | POA: Insufficient documentation

## 2021-07-15 DIAGNOSIS — C50911 Malignant neoplasm of unspecified site of right female breast: Secondary | ICD-10-CM | POA: Insufficient documentation

## 2021-07-15 DIAGNOSIS — C771 Secondary and unspecified malignant neoplasm of intrathoracic lymph nodes: Secondary | ICD-10-CM | POA: Insufficient documentation

## 2021-07-15 DIAGNOSIS — C50812 Malignant neoplasm of overlapping sites of left female breast: Secondary | ICD-10-CM

## 2021-07-15 DIAGNOSIS — C50919 Malignant neoplasm of unspecified site of unspecified female breast: Secondary | ICD-10-CM

## 2021-07-15 DIAGNOSIS — Z79811 Long term (current) use of aromatase inhibitors: Secondary | ICD-10-CM | POA: Insufficient documentation

## 2021-07-15 DIAGNOSIS — C7951 Secondary malignant neoplasm of bone: Secondary | ICD-10-CM

## 2021-07-15 LAB — CMP (CANCER CENTER ONLY)
ALT: 11 U/L (ref 0–44)
AST: 15 U/L (ref 15–41)
Albumin: 4.4 g/dL (ref 3.5–5.0)
Alkaline Phosphatase: 64 U/L (ref 38–126)
Anion gap: 7 (ref 5–15)
BUN: 11 mg/dL (ref 6–20)
CO2: 27 mmol/L (ref 22–32)
Calcium: 10.3 mg/dL (ref 8.9–10.3)
Chloride: 107 mmol/L (ref 98–111)
Creatinine: 0.92 mg/dL (ref 0.44–1.00)
GFR, Estimated: >60 mL/min (ref >60)
Glucose, Bld: 95 mg/dL (ref 70–99)
Potassium: 3.9 mmol/L (ref 3.5–5.1)
Sodium: 141 mmol/L (ref 135–145)
Total Bilirubin: 0.3 mg/dL (ref 0.3–1.2)
Total Protein: 7.9 g/dL (ref 6.5–8.1)

## 2021-07-15 LAB — CBC WITH DIFFERENTIAL (CANCER CENTER ONLY)
Abs Immature Granulocytes: 0 10*3/uL (ref 0.00–0.07)
Basophils Absolute: 0 10*3/uL (ref 0.0–0.1)
Basophils Relative: 1 %
Eosinophils Absolute: 0 10*3/uL (ref 0.0–0.5)
Eosinophils Relative: 1 %
HCT: 35 % — ABNORMAL LOW (ref 36.0–46.0)
Hemoglobin: 11.2 g/dL — ABNORMAL LOW (ref 12.0–15.0)
Immature Granulocytes: 0 %
Lymphocytes Relative: 65 %
Lymphs Abs: 1.4 10*3/uL (ref 0.7–4.0)
MCH: 29.2 pg (ref 26.0–34.0)
MCHC: 32 g/dL (ref 30.0–36.0)
MCV: 91.4 fL (ref 80.0–100.0)
Monocytes Absolute: 0.2 10*3/uL (ref 0.1–1.0)
Monocytes Relative: 7 %
Neutro Abs: 0.6 10*3/uL — ABNORMAL LOW (ref 1.7–7.7)
Neutrophils Relative %: 26 %
Platelet Count: 199 10*3/uL (ref 150–400)
RBC: 3.83 MIL/uL — ABNORMAL LOW (ref 3.87–5.11)
RDW: 14.9 % (ref 11.5–15.5)
WBC Count: 2.2 10*3/uL — ABNORMAL LOW (ref 4.0–10.5)
nRBC: 0 % (ref 0.0–0.2)

## 2021-07-15 MED ORDER — DENOSUMAB 120 MG/1.7ML ~~LOC~~ SOLN
120.0000 mg | Freq: Once | SUBCUTANEOUS | Status: AC
  Administered 2021-07-15: 120 mg via SUBCUTANEOUS
  Filled 2021-07-15: qty 1.7

## 2021-07-15 MED ORDER — PALBOCICLIB 75 MG PO TABS: 75.0000 mg | ORAL_TABLET | Freq: Every day | ORAL | 6 refills | Status: DC

## 2021-07-15 NOTE — Assessment & Plan Note (Signed)
Metastatic Breast Cancer: ?Current treatment: Anastrozole with Palbociclib started 05/11/20 ?Toxicities: ?1. Neutropenia: Currently on palbociclib 75 mg dose.  We will keep the dose of the same even though ANC today 0.8.  Today is her last day of her Ibrance. ?? ?? ?Scans:  ?1. 05/08/21: Unchanged appearance of the ST mass in left orbit, diffuse BM uptake ?2. 05/16/21: CT CAP: Stable diffuse bone mets, dec in size of liver lesion, no residual measurable celiac axis LN ?3. 05/16/21: Bone scan: Stable bone mets ?? ?Plan to repeat scans in 6 months. ?We will move her Xgeva to every 3 months.  ?She will come back in 3 months with labs injection and scans and follow-up with me. ?

## 2021-07-16 LAB — CANCER ANTIGEN 27.29: CA 27.29: 15.9 U/mL (ref 0.0–38.6)

## 2021-07-17 ENCOUNTER — Telehealth: Payer: Self-pay | Admitting: Adult Health

## 2021-07-17 NOTE — Telephone Encounter (Signed)
.  Called pt per 3/20 inbasket , Patient was unavailable, a message with appt time and date was left with number on file.   ?

## 2021-07-22 ENCOUNTER — Other Ambulatory Visit: Payer: Self-pay | Admitting: *Deleted

## 2021-07-22 ENCOUNTER — Encounter: Payer: Self-pay | Admitting: Hematology and Oncology

## 2021-07-22 DIAGNOSIS — C50919 Malignant neoplasm of unspecified site of unspecified female breast: Secondary | ICD-10-CM

## 2021-07-24 ENCOUNTER — Telehealth: Payer: Self-pay

## 2021-07-24 NOTE — Telephone Encounter (Signed)
Notified Patient of prior authorization approval for Ibrance '75mg'$ .  Medication is approved 07/24/2021 through 07/23/2022. No other needs or concerns voiced at this time ?

## 2021-07-28 ENCOUNTER — Encounter: Payer: Self-pay | Admitting: Oncology

## 2021-08-12 DIAGNOSIS — H35352 Cystoid macular degeneration, left eye: Secondary | ICD-10-CM | POA: Diagnosis not present

## 2021-08-12 DIAGNOSIS — H34812 Central retinal vein occlusion, left eye, with macular edema: Secondary | ICD-10-CM | POA: Diagnosis not present

## 2021-08-12 DIAGNOSIS — H47092 Other disorders of optic nerve, not elsewhere classified, left eye: Secondary | ICD-10-CM | POA: Diagnosis not present

## 2021-08-12 DIAGNOSIS — H35722 Serous detachment of retinal pigment epithelium, left eye: Secondary | ICD-10-CM | POA: Diagnosis not present

## 2021-08-13 ENCOUNTER — Encounter: Payer: Self-pay | Admitting: Hematology and Oncology

## 2021-08-16 ENCOUNTER — Ambulatory Visit
Admission: RE | Admit: 2021-08-16 | Discharge: 2021-08-16 | Disposition: A | Discharge status: Home / Self Care | Payer: BC Managed Care – PPO | Source: Ambulatory Visit | Attending: Hematology and Oncology | Admitting: Hematology and Oncology

## 2021-08-16 DIAGNOSIS — C6960 Malignant neoplasm of unspecified orbit: Secondary | ICD-10-CM | POA: Diagnosis not present

## 2021-08-16 DIAGNOSIS — C7949 Secondary malignant neoplasm of other parts of nervous system: Secondary | ICD-10-CM | POA: Diagnosis not present

## 2021-08-16 DIAGNOSIS — C50919 Malignant neoplasm of unspecified site of unspecified female breast: Secondary | ICD-10-CM

## 2021-08-16 MED ORDER — GADOBENATE DIMEGLUMINE 529 MG/ML IV SOLN
20.0000 mL | Freq: Once | INTRAVENOUS | Status: AC | PRN
  Administered 2021-08-16: 20 mL via INTRAVENOUS

## 2021-08-24 IMAGING — MR MR HEAD WO/W CM
9 series · 48 of 48 positions shown · IV contrast (multihance)
Comparison: MRI of the orbits from April 13, 20. CT head from

CLINICAL DATA: Breast cancer, staging.

EXAM:
MRI HEAD WITHOUT AND WITH CONTRAST
TECHNIQUE: Multiplanar, multiecho pulse sequences of the brain and surrounding
structures were obtained without and with intravenous contrast.
CONTRAST:  16mL MULTIHANCE GADOBENATE DIMEGLUMINE 529 MG/ML IV SOLN

[Series 2: t1_se_sag · sagittal · 5.0mm · 0.45mm/px · 2 of 21 slices shown]
[im 1/21]
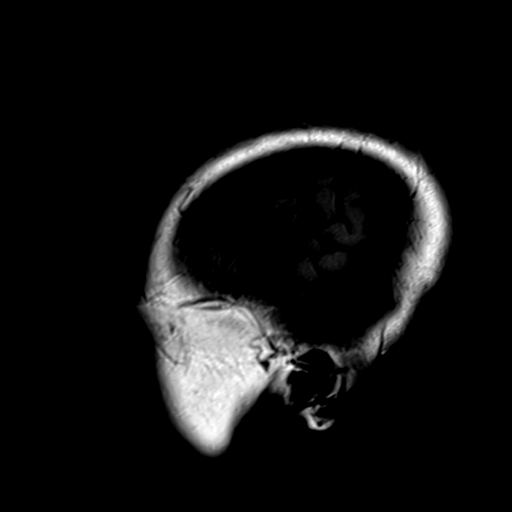
[im 21/21]
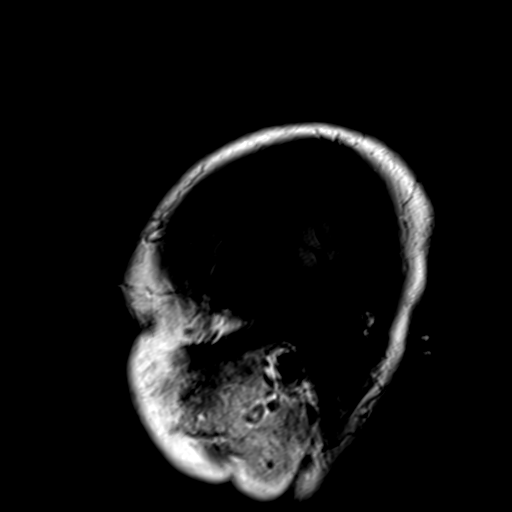

[Series 4: ep2d_diff_3_adc · axial · 3.0mm · 1.80mm/px · z∈[-89,+52]mm · 5 of 46 slices shown]
[im 1/46]
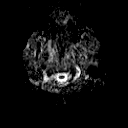
[im 12/46]
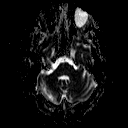
[im 23/46]
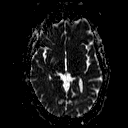
[im 34/46]
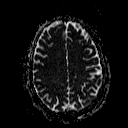
[im 46/46]
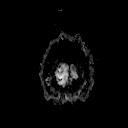

[Series 5: ep2d_diff_cor · coronal · 5.0mm · 1.77mm/px · 5 of 48 slices shown]
[im 1/48]
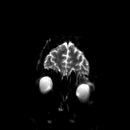
[im 12/48]
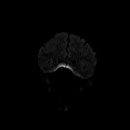
[im 24/48]
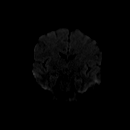
[im 36/48]
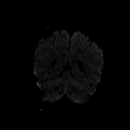
[im 48/48]
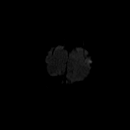

[Series 8: swi_images · axial · 2.0mm · 0.90mm/px · z∈[-97,+61]mm · 8 of 80 slices shown]
[im 1/80]
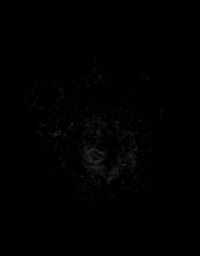
[im 12/80]
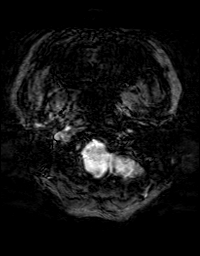
[im 23/80]
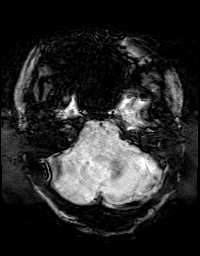
[im 34/80]
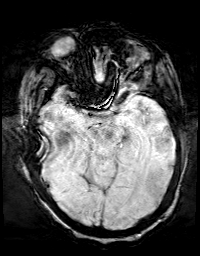
[im 46/80]
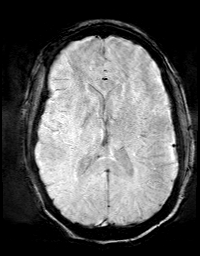
[im 57/80]
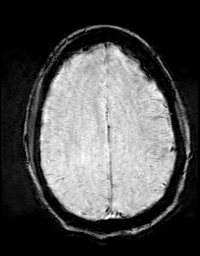
[im 68/80]
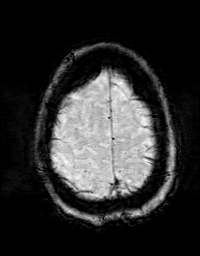
[im 80/80]
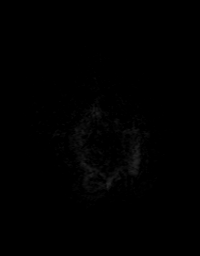

[Series 10: t2_tse_tra_512 · axial · 5.0mm · 0.60mm/px · z∈[-87,+51]mm · 3 of 24 slices shown]
[im 1/24]
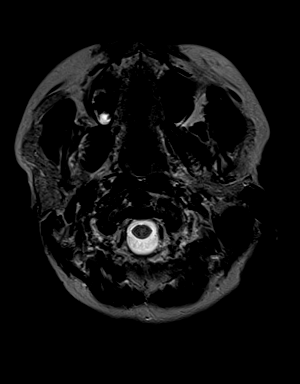
[im 12/24]
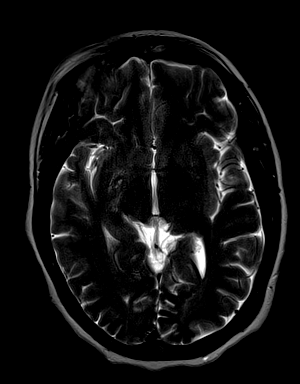
[im 24/24]
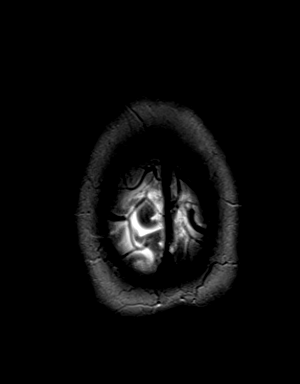

[Series 11: t1_mpr_tra · axial · 1.0mm · 0.72mm/px · z∈[-98,+61]mm · 17 of 160 slices shown]
[im 1/160]
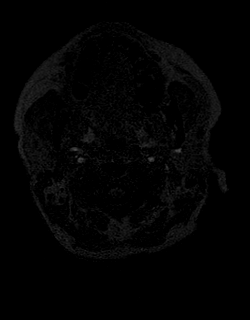
[im 10/160]
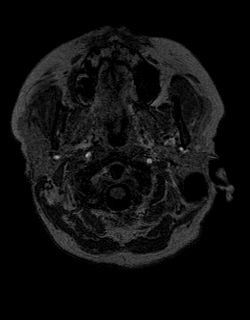
[im 20/160]
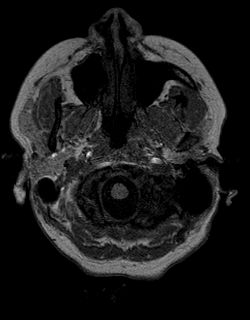
[im 30/160]
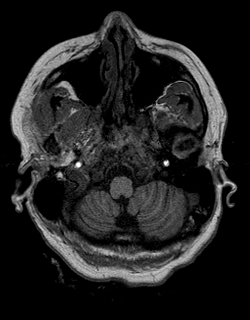
[im 40/160]
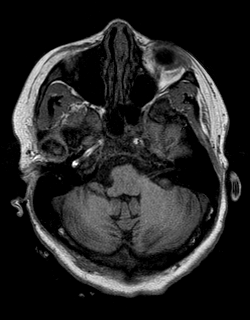
[im 50/160]
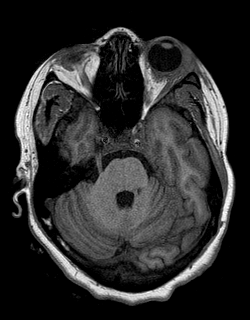
[im 60/160]
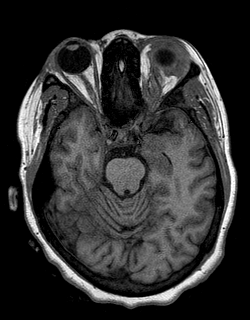
[im 70/160]
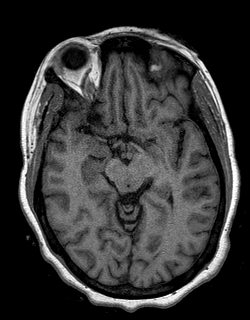
[im 80/160]
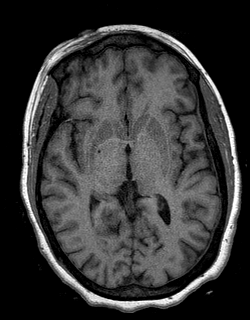
[im 90/160]
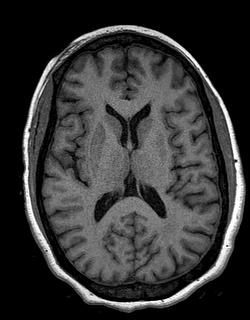
[im 100/160]
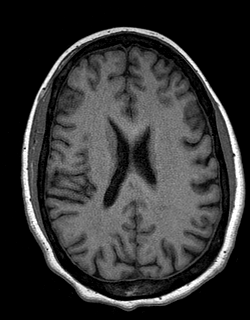
[im 110/160]
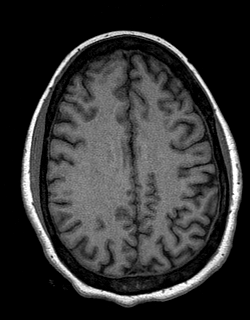
[im 120/160]
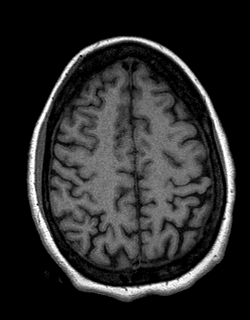
[im 130/160]
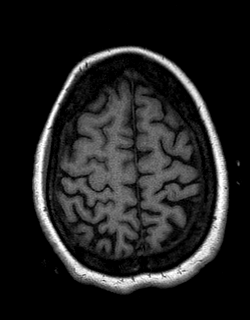
[im 140/160]
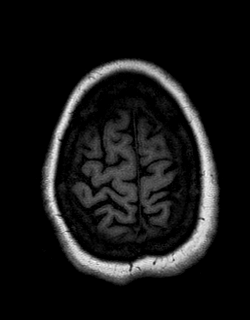
[im 150/160]
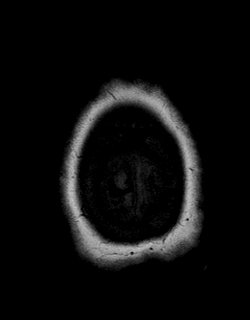
[im 160/160]
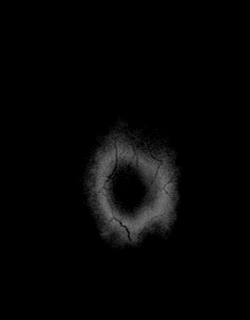

[Series 12: T2 · coronal · 5.0mm · 0.45mm/px · 3 of 26 slices shown]
[im 1/26]
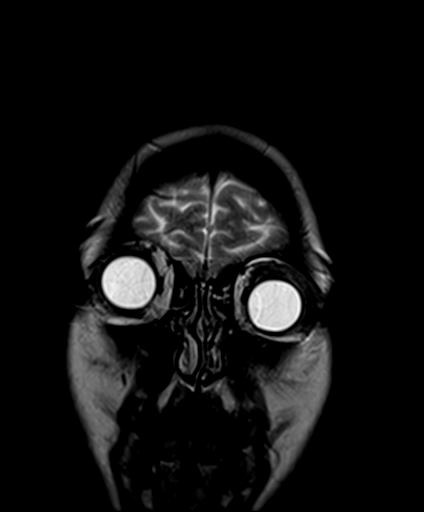
[im 13/26]
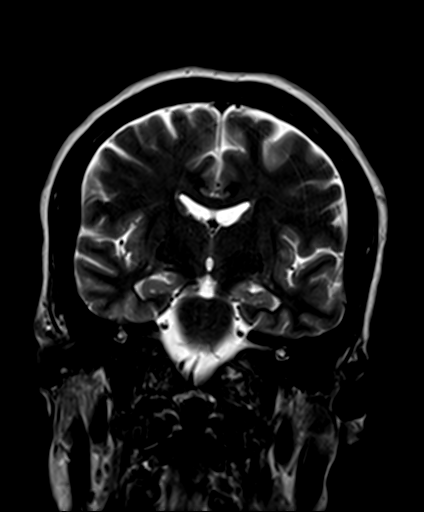
[im 26/26]
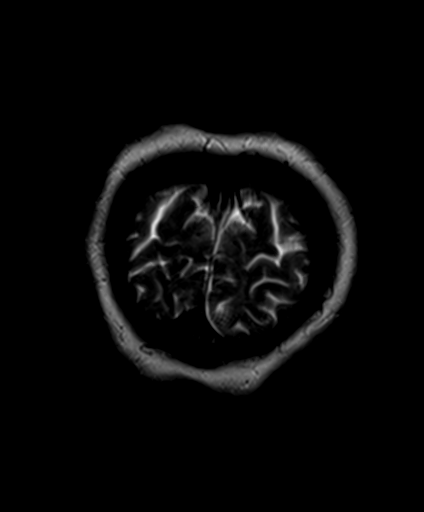

[Series 13: T1 post-contrast · coronal · 5.0mm · 0.72mm/px · 3 of 26 slices shown (1 of 2)]
[im 1/26]
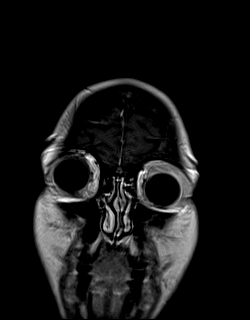
[im 13/26]
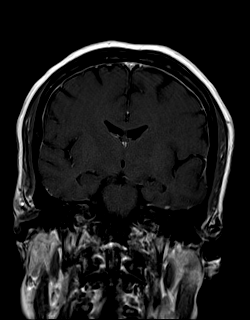
[im 26/26]
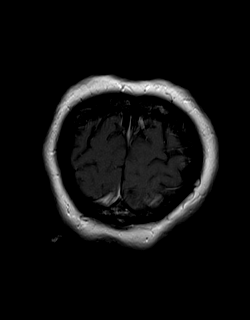

[Series 15: T1 post-contrast · sagittal · 5.0mm · 0.45mm/px · 2 of 21 slices shown (2 of 2)]
[im 1/21]
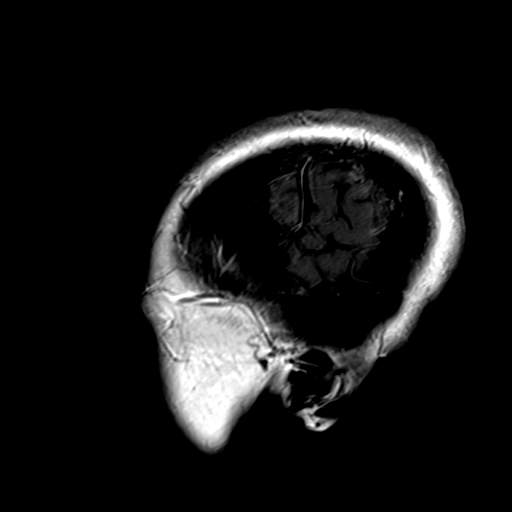
[im 21/21]
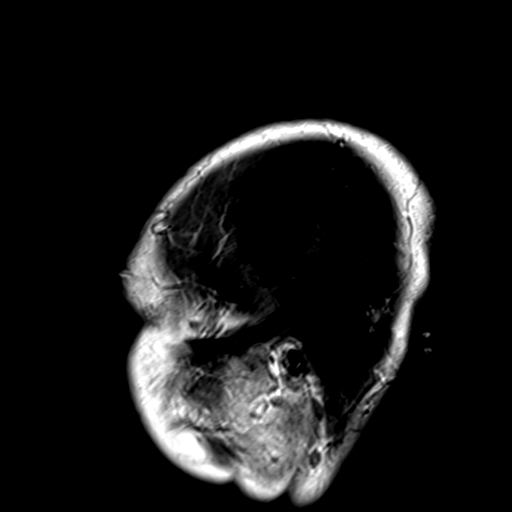

[48 of 48 positions shown; findings below may reference images not displayed]

FINDINGS: Brain: No acute infarction, hemorrhage, hydrocephalus, extra-axial
collection or mass lesion. No abnormal of enhancement. Mild T2/FLAIR
hyperintensity within the white matter, within normal limits for
age. Very mild diffuse dural prominence is favored within normal
limits given the absence of focal nodularity, subjacent brain edema,
or evidence of intracranial hypertension.

Vascular: Major flow voids are maintained at the skull base.

Skull and upper cervical spine: Similar diffuse marked T1
hypointensity of the marrow.

Sinuses/Orbits: Redemonstrated abnormal enhancing tissue surrounding
the left globe and left lacrimal gland with extension posteriorly,
better characterized on recent MRI of the orbits from 04/13/2020.

Other: No mastoid effusions.
IMPRESSION: 1. No evidence of intracranial metastatic disease or acute
intracranial abnormality.
2. Redemonstrated abnormal enhancing tissue surrounding the left
globe and left lacrimal gland with extension posteriorly, concerning
for metastatic disease given the clinical history and better
characterized on recent dedicated MRI of the orbits.
3. Similar diffuse marked T1 hypointensity of the marrow,
nonspecific but concerning for diffuse osseous metastatic disease.

## 2021-08-26 ENCOUNTER — Inpatient Hospital Stay: Payer: BC Managed Care – PPO | Attending: Oncology

## 2021-08-26 ENCOUNTER — Encounter: Payer: Self-pay | Admitting: Adult Health

## 2021-08-26 ENCOUNTER — Inpatient Hospital Stay (HOSPITAL_BASED_OUTPATIENT_CLINIC_OR_DEPARTMENT_OTHER): Payer: BC Managed Care – PPO | Admitting: Adult Health

## 2021-08-26 ENCOUNTER — Other Ambulatory Visit: Payer: Self-pay

## 2021-08-26 VITALS — BP 120/78 | HR 76 | Temp 97.5°F | Resp 16 | Ht 68.0 in | Wt 229.9 lb

## 2021-08-26 DIAGNOSIS — Z170 Estrogen receptor positive status [ER+]: Secondary | ICD-10-CM | POA: Insufficient documentation

## 2021-08-26 DIAGNOSIS — C771 Secondary and unspecified malignant neoplasm of intrathoracic lymph nodes: Secondary | ICD-10-CM | POA: Diagnosis not present

## 2021-08-26 DIAGNOSIS — C7952 Secondary malignant neoplasm of bone marrow: Secondary | ICD-10-CM | POA: Diagnosis not present

## 2021-08-26 DIAGNOSIS — C7951 Secondary malignant neoplasm of bone: Secondary | ICD-10-CM | POA: Diagnosis not present

## 2021-08-26 DIAGNOSIS — C50919 Malignant neoplasm of unspecified site of unspecified female breast: Secondary | ICD-10-CM | POA: Insufficient documentation

## 2021-08-26 DIAGNOSIS — C787 Secondary malignant neoplasm of liver and intrahepatic bile duct: Secondary | ICD-10-CM

## 2021-08-26 DIAGNOSIS — Z79811 Long term (current) use of aromatase inhibitors: Secondary | ICD-10-CM | POA: Insufficient documentation

## 2021-08-26 DIAGNOSIS — C50911 Malignant neoplasm of unspecified site of right female breast: Secondary | ICD-10-CM

## 2021-08-26 DIAGNOSIS — H34812 Central retinal vein occlusion, left eye, with macular edema: Secondary | ICD-10-CM | POA: Diagnosis not present

## 2021-08-26 LAB — CBC WITH DIFFERENTIAL (CANCER CENTER ONLY)
Abs Immature Granulocytes: 0.01 10*3/uL (ref 0.00–0.07)
Basophils Absolute: 0 10*3/uL (ref 0.0–0.1)
Basophils Relative: 1 %
Eosinophils Absolute: 0.1 10*3/uL (ref 0.0–0.5)
Eosinophils Relative: 2 %
HCT: 34.7 % — ABNORMAL LOW (ref 36.0–46.0)
Hemoglobin: 11.1 g/dL — ABNORMAL LOW (ref 12.0–15.0)
Immature Granulocytes: 0 %
Lymphocytes Relative: 51 %
Lymphs Abs: 1.7 10*3/uL (ref 0.7–4.0)
MCH: 29.3 pg (ref 26.0–34.0)
MCHC: 32 g/dL (ref 30.0–36.0)
MCV: 91.6 fL (ref 80.0–100.0)
Monocytes Absolute: 0.3 10*3/uL (ref 0.1–1.0)
Monocytes Relative: 9 %
Neutro Abs: 1.2 10*3/uL — ABNORMAL LOW (ref 1.7–7.7)
Neutrophils Relative %: 37 %
Platelet Count: 229 10*3/uL (ref 150–400)
RBC: 3.79 MIL/uL — ABNORMAL LOW (ref 3.87–5.11)
RDW: 14.8 % (ref 11.5–15.5)
WBC Count: 3.2 10*3/uL — ABNORMAL LOW (ref 4.0–10.5)
nRBC: 0 % (ref 0.0–0.2)

## 2021-08-26 LAB — CMP (CANCER CENTER ONLY)
ALT: 13 U/L (ref 0–44)
AST: 17 U/L (ref 15–41)
Albumin: 4.3 g/dL (ref 3.5–5.0)
Alkaline Phosphatase: 62 U/L (ref 38–126)
Anion gap: 4 — ABNORMAL LOW (ref 5–15)
BUN: 13 mg/dL (ref 6–20)
CO2: 28 mmol/L (ref 22–32)
Calcium: 9.9 mg/dL (ref 8.9–10.3)
Chloride: 109 mmol/L (ref 98–111)
Creatinine: 0.87 mg/dL (ref 0.44–1.00)
GFR, Estimated: >60 mL/min (ref >60)
Glucose, Bld: 112 mg/dL — ABNORMAL HIGH (ref 70–99)
Potassium: 4 mmol/L (ref 3.5–5.1)
Sodium: 141 mmol/L (ref 135–145)
Total Bilirubin: 0.3 mg/dL (ref 0.3–1.2)
Total Protein: 7.5 g/dL (ref 6.5–8.1)

## 2021-08-26 NOTE — Progress Notes (Signed)
[name]: ?  ? ?Stacy Lose, MD ?[place] ?[place] [id number] ? ? ?DIAGNOSIS:  Cancer Staging  ?Breast cancer metastasized to intrathoracic lymph node ([place]) ?Staging form: Breast, AJCC 7th Edition ?- Clinical: Stage Unknown ([place], N1, cM0) - Unsigned ?Specimen type: Core Needle Biopsy ?Laterality: Left ?Staging comments: Staged at breast conference 05/18/13 ? ?- Pathologic: No stage assigned - Unsigned ?Specimen type: Core Needle Biopsy ?Laterality: Left ?- Pathologic: Stage IV (M1) - Signed by Chauncey Cruel, MD on 05/01/2020 ?Biopsy of metastatic site performed: Yes ?Source of metastatic specimen: Pleura ? ? ?SUMMARY OF ONCOLOGIC HISTORY: ?Oncology History  ?Breast cancer metastasized to intrathoracic lymph node ([place])  ?01/02/2010 Initial Biopsy  ? bilateral mastectomies with left axillary lymph node sampling [6 nodes removed] 01/02/2010 for a pT2 pN0(i+), stage IIA invasive lobular breast cancer, grade 3, estrogen and progesterone receptor positive, HER-2 not amplified ?  ?2011 Oncotype testing  ? Oncotype DX score of 22 predicted a 14% risk of distant recurrence within 10 years if the patient's only systemic treatment is tamoxifen for 5 years ?  ?01/2010 - 06/2010 Chemotherapy  ? CMF(cyclophosphamide, fluorouracil, methotrexate) x7 given between October of 2011 and March of 2012 (7 of 8 planned treatments completed) ?  ?07/2010 - 10/2011 Anti-estrogen oral therapy  ? tamoxifen started April 2012, discontinued approximately July of 2013 (about 15 months) because of problems with hot flashes and insomnia ?  ?05/11/2013 Initial Diagnosis  ? pathologically documented left axillary recurrence 05/05/2013, the tumor being again estrogen and progesterone receptor positive, with HER-2 not amplified, and MIB-1 of 15%. Staging CT/PET 05/27/2013 show left axillary and supraclavicular recurrence, no distant disease ?  ?06/16/2013 Surgery  ? completion left axillary lymph  node dissection 06/16/2013 showing a total of 10/15 lymph nodes involved by tumor, with evidence of extracapsular extension (TX N3 = stage IIIC) ?  ?07/11/2013 - 09/12/2013 Chemotherapy  ? Carboplatin/ docetaxel, first dose on 07/11/2013, repeated every 21 days x 4 with Neulasta support on day 2.  Final dose was given on 09/12/2013, with chemotherapy discontinued after 4 cycles due to continuing low counts ?  ?10/2013 - 12/09/2013 Radiation Therapy  ? Left chest wall / 50.4 Gray @ 1.8 Gray per fraction x 28 fractions ?Left Supraclavicular fossa and PAB/ 45 Gray _0 .8 Gray per fraction x 25 fractions ?Left scar / 10 Gray at [employer] per fraction x 5 fractions     ?  ?01/25/2014 Surgery  ? bilateral salpingo-oophorectomy on 01/25/14 ?  ?12/2013 Miscellaneous  ? sequencing and deletion/duplication analysis of 17 genes performed September 2015 at [facility] showed no deleterious mutations in  ATM, BARD1, BRCA1, BRCA2, BRIP1, CDH1, CHEK2, MRE11A, MUTYH, NBN, NF1, PALB2, PTEN, RAD50, RAD51C, RAD51D, and TP53 ?  ?02/27/2014 - 03/2020 Anti-estrogen oral therapy  ? tamoxifen re-started 02/27/2014, discontinued December 2021 with progression ?  ?04/23/2020 Relapse/Recurrence  ? bone marrow biopsy 04/23/2020 shows metastatic carcinoma, estrogen and progesterone receptor positive ?            (a) MRI of the cervical spine 04/07/2020 shows markedly abnormal marrow signal, prominent right axillary lymph nodes, degenerative disc disease ?            (b) MRI of the orbits 04/13/2020 suggests metastatic disease around the left orbit and diffusely abnormal bone marrow signal ?            (c) CT scan of the chest, abdomen and pelvis 05/10/2020 shows extensive adenopathy (  right axillary, prevascular, right hilar, celiac and portacaval axes), multiple liver masses, multiple sclerotic bone lesions ?  ?04/2020 - 05/31/2020 Radiation Therapy  ? radiation to the left periorbital region completed 05/31/2020 ?  ?05/01/2020 -  Anti-estrogen oral therapy   ? anastrozole started 05/01/2020 ?palbociclib started 05/11/2020 ?denosumab/ Xgeva started 07/05/2020 ?  ? ? ?CURRENT THERAPY: Anastrozole, palbociclib, Xgeva starting March 10 ? ?INTERVAL HISTORY: ?[name] 46 y.o. female returns for evaluation of her metastatic breast cancer.  She is taking anastrozole, palbociclib, and receives Xgeva every 12 weeks. ? ?She is taking anastrozole daily with good tolerance.  She takes Ibrance 75 mg 2 weeks on and 2 weeks off.  She is due to start back today.  Her only issue today is constipation.  She is drinking plenty of water but tells me she does have decreased fiber intake.  She wants to know what she can take over-the-counter to help offset her constipation. ? ?She was previously having headaches across her left eye, her MRI did not show any worsening in her orbital lesion however she did see her eye specialist who noted that she did have some vascular damage back in the orbit. ? ?Her most recent MRI of the orbits was completed on August 17, 2021 which showed an unchanged appearance of hyperintense T2 weighted signal in the lateral aspect of the left orbit which was unchanged and measuring Bell to 6 mm in thickness. ? ?Her next CT chest abdomen pelvis is scheduled in June 2023. ? ?We have been following her CA 27-29 with the May 2022- May 2023 results noted below ? Latest Reference Range & Units 08/30/20 12:43 09/27/20 13:10 10/25/20 13:15 11/22/20 12:48 12/17/20 14:50 01/18/21 15:03 02/18/21 12:10 03/18/21 14:50 04/15/21 13:39 05/22/21 09:57 07/15/21 08:41  ?CA 27.29 0.0 - 38.6 U/mL 72.8 (H) 36.0 24.8 17.8 19.2 18.1 19.7 13.0 13.2 20.7 15.9  ?(H): Data is abnormally high ?Patient Active Problem List  ? Diagnosis Date Noted  ? De Quervain's disease (tenosynovitis) 06/07/2021  ? Malignant neoplasm metastatic to liver ([place]) 05/11/2020  ? Cancer related pain 05/11/2020  ? Malignant neoplasm metastatic to bone ([place]) 05/03/2020  ? Goals of care, counseling/discussion  05/03/2020  ? Elevated sed rate (elev SR) 07/31/2019  ? History of reconstruction of both breasts 06/29/2018  ? Acquired absence of breast and absent nipple, bilateral 02/16/2018  ? Capsular contracture of breast implant 02/16/2018  ? Bulge of cervical disc without myelopathy 04/17/2015  ? Chest skin lesion 09/20/2014  ? Hot flashes 08/08/2014  ? Weight gain 08/08/2014  ? Endometriosis of ovary 01/25/2014  ? Fibroids, subserous 01/25/2014  ? Lymphedema of arm 01/09/2014  ? Chemotherapy induced neutropenia ([place]) 10/10/2013  ? Lower back pain 10/10/2013  ? Bilateral lower extremity edema 09/08/2013  ? Anxiety 09/08/2013  ? Depression 08/10/2013  ? Anemia 07/20/2013  ? Pain in left axilla 07/06/2013  ? Breast cancer metastasized to intrathoracic lymph node ([place]) 05/11/2013  ? ? ?is allergic to sulfa antibiotics, gadolinium derivatives, and petrolatum. ? ?MEDICAL HISTORY: ?Past Medical History:  ?Diagnosis Date  ? Back muscle spasm   ? occasional tx with skelaxin  ? GERD (gastroesophageal reflux disease)   ? Radiation 10/24/13-12/09/13  ? Left chestwall/supraclav./scar  ? ? ?SURGICAL HISTORY: ?Past Surgical History:  ?Procedure Laterality Date  ? AXILLARY LYMPH NODE DISSECTION Left 06/16/2013  ? Procedure: LEFT AXILLARY NODE DISSECTION;  Surgeon: Shann Medal, MD;  Location: [facility];  Service: General;  Laterality: Left;  ? BILATERAL TOTAL  MASTECTOMY WITH AXILLARY LYMPH NODE DISSECTION    ? BREAST IMPLANT REMOVAL Bilateral 03/18/2018  ? Procedure: REMOVAL BREAST IMPLANTS;  Surgeon: Wallace Going, DO;  Location: [place];  Service: Plastics;  Laterality: Bilateral;  ? BREAST SURGERY    ? CAPSULECTOMY Bilateral 03/18/2018  ? Procedure: CAPSULECTOMY;  Surgeon: Wallace Going, DO;  Location: [place];  Service: Plastics;  Laterality: Bilateral;  ? LAPAROSCOPIC BILATERAL SALPINGO OOPHERECTOMY Bilateral 01/25/2014  ? Procedure: LAPAROSCOPIC BILATERAL SALPINGO OOPHORECTOMY;  Surgeon:  Eldred Manges, MD;  Location: [facility];  Service: Gynecology;  Laterality: Bilateral;  ? LEEP    ? PLACEMENT OF BREAST IMPLANTS Bilateral 03/18/2018  ? Procedure: PLACEMENT OF BREAST IMPLANTS;  Surgeon: Kizzie Furnish

## 2021-08-26 NOTE — Assessment & Plan Note (Signed)
Metastatic Breast Cancer: ?Current treatment: Anastrozole with Palbociclib started 05/11/20 ?Toxicities: ?1. Neutropenia: Currently on palbociclib 75 mg dose, due to neutropenia she takes this medication 2 weeks on and 2 weeks off.  Her ANC is 1.2 today.  She will restart her 2 weeks on today. ?2.  Headaches: These are likely secondary to vascular changes from her previous orbital treatments.  She can take Tylenol as needed for her headaches. ? ?We discussed her MRI and I showed her the images on the computer screen today.  The radiologist notes her lesion is unchanged.  She will undergo CT chest abdomen pelvis for restaging on October 04, 2021.  She has follow-up with Dr. Lindi Adie on June 12 to discuss the scans and for labs, evaluation, and for Delton See that she receives every 12 weeks.  She knows to call for any questions or concerns between now and her next appointment. ? ?? ?? ? ?

## 2021-08-27 LAB — CANCER ANTIGEN 27.29: CA 27.29: 11.8 U/mL (ref 0.0–38.6)

## 2021-09-05 ENCOUNTER — Other Ambulatory Visit: Payer: BC Managed Care – PPO

## 2021-09-16 DIAGNOSIS — Z01419 Encounter for gynecological examination (general) (routine) without abnormal findings: Secondary | ICD-10-CM | POA: Diagnosis not present

## 2021-09-16 DIAGNOSIS — Z1211 Encounter for screening for malignant neoplasm of colon: Secondary | ICD-10-CM | POA: Diagnosis not present

## 2021-09-16 DIAGNOSIS — R600 Localized edema: Secondary | ICD-10-CM | POA: Diagnosis not present

## 2021-09-16 DIAGNOSIS — C50919 Malignant neoplasm of unspecified site of unspecified female breast: Secondary | ICD-10-CM | POA: Diagnosis not present

## 2021-09-16 DIAGNOSIS — Z124 Encounter for screening for malignant neoplasm of cervix: Secondary | ICD-10-CM | POA: Diagnosis not present

## 2021-09-16 DIAGNOSIS — R635 Abnormal weight gain: Secondary | ICD-10-CM | POA: Diagnosis not present

## 2021-09-16 DIAGNOSIS — Z6836 Body mass index (BMI) 36.0-36.9, adult: Secondary | ICD-10-CM | POA: Diagnosis not present

## 2021-09-18 DIAGNOSIS — H04123 Dry eye syndrome of bilateral lacrimal glands: Secondary | ICD-10-CM | POA: Diagnosis not present

## 2021-09-18 DIAGNOSIS — H4942 Progressive external ophthalmoplegia, left eye: Secondary | ICD-10-CM | POA: Diagnosis not present

## 2021-09-18 DIAGNOSIS — H34812 Central retinal vein occlusion, left eye, with macular edema: Secondary | ICD-10-CM | POA: Diagnosis not present

## 2021-09-18 DIAGNOSIS — H16212 Exposure keratoconjunctivitis, left eye: Secondary | ICD-10-CM | POA: Diagnosis not present

## 2021-09-20 DIAGNOSIS — R635 Abnormal weight gain: Secondary | ICD-10-CM | POA: Diagnosis not present

## 2021-09-25 NOTE — Progress Notes (Signed)
Patient Care Team: Nicholas Lose, MD as PCP - General (Hematology and Oncology) Gavin Pound, MD as Consulting Physician (Rheumatology) Katy Fitch, Darlina Guys, MD as Consulting Physician (Ophthalmology) Kyung Rudd, MD as Consulting Physician (Radiation Oncology) Jalene Mullet, MD as Consulting Physician (Ophthalmology) Nicholas Lose, MD as Consulting Physician (Hematology and Oncology)  DIAGNOSIS:  Encounter Diagnosis  Name Primary?   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 ([place])     SUMMARY OF ONCOLOGIC HISTORY: Oncology History  Breast cancer metastasized to intrathoracic lymph node ([place])  01/02/2010 Initial Biopsy   bilateral mastectomies with left axillary lymph node sampling [6 nodes removed] 01/02/2010 for a pT2 pN0(i+), stage IIA invasive lobular breast cancer, grade 3, estrogen and progesterone receptor positive, HER-2 not amplified   2011 Oncotype testing   Oncotype DX score of 22 predicted a 14% risk of distant recurrence within 10 years if the patient's only systemic treatment is tamoxifen for 5 years   01/2010 - 06/2010 Chemotherapy   CMF(cyclophosphamide, fluorouracil, methotrexate) x7 given between October of 2011 and March of 2012 (7 of 8 planned treatments completed)   07/2010 - 10/2011 Anti-estrogen oral therapy   tamoxifen started April 2012, discontinued approximately July of 2013 (about 15 months) because of problems with hot flashes and insomnia   05/11/2013 Initial Diagnosis   pathologically documented left axillary recurrence 05/05/2013, the tumor being again estrogen and progesterone receptor positive, with HER-2 not amplified, and MIB-1 of 15%. Staging CT/PET 05/27/2013 show left axillary and supraclavicular recurrence, no distant disease   06/16/2013 Surgery   completion left axillary lymph node dissection 06/16/2013 showing a total of 10/15 lymph nodes involved by tumor, with evidence of extracapsular extension (TX N3 = stage  IIIC)   07/11/2013 - 09/12/2013 Chemotherapy   Carboplatin/ docetaxel, first dose on 07/11/2013, repeated every 21 days x 4 with Neulasta support on day 2.  Final dose was given on 09/12/2013, with chemotherapy discontinued after 4 cycles due to continuing low counts   10/2013 - 12/09/2013 Radiation Therapy   Left chest wall / 50.4 Gray @ 1.8 Pearline Cables per fraction x 28 fractions Left Supraclavicular fossa and PAB/ 45 Gray @1 .8 Gray per fraction x 25 fractions Left scar / 10 Gray at [employer] per fraction x 5 fractions       01/25/2014 Surgery   bilateral salpingo-oophorectomy on 01/25/14   12/2013 Miscellaneous   sequencing and deletion/duplication analysis of 17 genes performed September 2015 at [facility] showed no deleterious mutations in  ATM, BARD1, BRCA1, BRCA2, BRIP1, CDH1, CHEK2, MRE11A, MUTYH, NBN, NF1, PALB2, PTEN, RAD50, RAD51C, RAD51D, and TP53   02/27/2014 - 03/2020 Anti-estrogen oral therapy   tamoxifen re-started 02/27/2014, discontinued December 2021 with progression   04/23/2020 Relapse/Recurrence   bone marrow biopsy 04/23/2020 shows metastatic carcinoma, estrogen and progesterone receptor positive             (a) MRI of the cervical spine 04/07/2020 shows markedly abnormal marrow signal, prominent right axillary lymph nodes, degenerative disc disease             (b) MRI of the orbits 04/13/2020 suggests metastatic disease around the left orbit and diffusely abnormal bone marrow signal             (c) CT scan of the chest, abdomen and pelvis 05/10/2020 shows extensive adenopathy (right axillary, prevascular, right hilar, celiac and portacaval axes), multiple liver masses, multiple sclerotic bone lesions   04/2020 - 05/31/2020 Radiation Therapy   radiation to the left  periorbital region completed 05/31/2020   05/01/2020 -  Anti-estrogen oral therapy   anastrozole started 05/01/2020 palbociclib started 05/11/2020 denosumab/ Xgeva started 07/05/2020     CHIEF COMPLIANT: Follow-up of  stage IV breast cancer, to establish oncology care with me  INTERVAL HISTORY: [name] is a 46 y.o. with above-mentioned history of stage IV breast cancer, estrogen receptor positive, currently on anastrozole. She presents to the clinic today for follow-up. Denies constipation. Denies side effects to the [place]. States that her energy is better. States that she is not hot like she usually be.   ALLERGIES:  is allergic to sulfa antibiotics, gadolinium derivatives, and petrolatum.  MEDICATIONS:  Current Outpatient Medications  Medication Sig Dispense Refill   anastrozole (ARIMIDEX) 1 MG tablet Take 1 tablet (1 mg total) by mouth daily. 90 tablet 4   famotidine (PEPCID) 20 MG tablet Take 1 tablet (20 mg total) by mouth 2 (two) times daily. 60 tablet 6   LORazepam (ATIVAN) 0.5 MG tablet Take 1 tablet (0.5 mg total) by mouth every 8 (eight) hours. 30 tablet 0   ofloxacin (OCUFLOX) 0.3 % ophthalmic solution SMARTSIG:1 Drop(s) Left Eye Every 4 Hours     palbociclib (IBRANCE) 75 MG tablet Take 1 tablet (75 mg total) by mouth daily. Take for 14 days on, 14 days off, repeat every 28 days. 14 tablet 6   No current facility-administered medications for this visit.    PHYSICAL EXAMINATION: ECOG PERFORMANCE STATUS: 1 - Symptomatic but completely ambulatory  Vitals:   10/07/21 0850  BP: 123/85  Pulse: 72  Resp: 18  Temp: (!) 97.3 F (36.3 C)  SpO2: 100%   Filed Weights   10/07/21 0850  Weight: 227 lb 14.4 oz (103.4 kg)     LABORATORY DATA:  I have reviewed the data as listed    Latest Ref Rng & Units 10/07/2021    8:22 AM 08/26/2021    8:46 AM 07/15/2021    8:41 AM  CMP  Glucose 70 - 99 mg/dL 108  112  95   BUN 6 - 20 mg/dL 15  13  11    Creatinine 0.44 - 1.00 mg/dL 1.02  0.87  0.92   Sodium 135 - 145 mmol/L 138  141  141   Potassium 3.5 - 5.1 mmol/L 4.1  4.0  3.9   Chloride 98 - 111 mmol/L 106  109  107   CO2 22 - 32 mmol/L 26  28  27    Calcium 8.9 - 10.3 mg/dL 10.2  9.9   10.3   Total Protein 6.5 - 8.1 g/dL 7.7  7.5  7.9   Total Bilirubin 0.3 - 1.2 mg/dL 0.3  0.3  0.3   Alkaline Phos 38 - 126 U/L 58  62  64   AST 15 - 41 U/L 15  17  15    ALT 0 - 44 U/L 10  13  11      Lab Results  Component Value Date   WBC 2.4 (L) 10/07/2021   HGB 11.2 (L) 10/07/2021   HCT 34.3 (L) 10/07/2021   MCV 90.3 10/07/2021   PLT 244 10/07/2021   NEUTROABS 0.7 (L) 10/07/2021    ASSESSMENT & PLAN:  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  ANC today 0.7 (she just came off). 2 weeks on 2 weeks off. Weight gain: I discussed with her about documenting her food  intake with the help of an application like my plate.   Scans:  1. 05/08/21: Unchanged appearance of the ST mass in left orbit, diffuse BM uptake 2. 05/16/21: CT CAP: Stable diffuse bone mets, dec in size of liver lesion, no residual measurable celiac axis LN 3. 05/16/21: Bone scan: Stable bone mets 4. 10/05/21: Stable diffuse bone mets, stable Right Axillary and Bilateral Ext iliac LN  Radiology Review: Discussed the results of scans. Labs and Delton See and MD visit in 3 months and scans in 6 months Continue with current treatment and dosage of Ibrance    No orders of the defined types were placed in this encounter.  The patient has a good understanding of the overall plan. she agrees with it. she will call with any problems that may develop before the next visit here. Total time spent: 30 mins including face to face time and time spent for planning, charting and co-ordination of care   Harriette Ohara, MD 10/07/21    I Gardiner Coins am scribing for Dr. Lindi Adie  I have reviewed the above documentation for accuracy and completeness, and I agree with the above.

## 2021-09-26 ENCOUNTER — Encounter: Payer: Self-pay | Admitting: Hematology and Oncology

## 2021-10-02 ENCOUNTER — Encounter: Payer: Self-pay | Admitting: Hematology and Oncology

## 2021-10-04 ENCOUNTER — Ambulatory Visit (HOSPITAL_COMMUNITY)
Admission: RE | Admit: 2021-10-04 | Discharge: 2021-10-04 | Disposition: A | Discharge status: Home / Self Care | Payer: BC Managed Care – PPO | Source: Ambulatory Visit | Attending: Hematology and Oncology | Admitting: Hematology and Oncology

## 2021-10-04 ENCOUNTER — Other Ambulatory Visit: Payer: BC Managed Care – PPO

## 2021-10-04 DIAGNOSIS — C7951 Secondary malignant neoplasm of bone: Secondary | ICD-10-CM | POA: Diagnosis not present

## 2021-10-04 DIAGNOSIS — C50919 Malignant neoplasm of unspecified site of unspecified female breast: Secondary | ICD-10-CM | POA: Diagnosis not present

## 2021-10-04 DIAGNOSIS — C50911 Malignant neoplasm of unspecified site of right female breast: Secondary | ICD-10-CM | POA: Insufficient documentation

## 2021-10-04 DIAGNOSIS — K8020 Calculus of gallbladder without cholecystitis without obstruction: Secondary | ICD-10-CM | POA: Diagnosis not present

## 2021-10-04 DIAGNOSIS — D171 Benign lipomatous neoplasm of skin and subcutaneous tissue of trunk: Secondary | ICD-10-CM | POA: Diagnosis not present

## 2021-10-04 DIAGNOSIS — K769 Liver disease, unspecified: Secondary | ICD-10-CM | POA: Diagnosis not present

## 2021-10-04 DIAGNOSIS — C771 Secondary and unspecified malignant neoplasm of intrathoracic lymph nodes: Secondary | ICD-10-CM | POA: Diagnosis not present

## 2021-10-04 DIAGNOSIS — R599 Enlarged lymph nodes, unspecified: Secondary | ICD-10-CM | POA: Diagnosis not present

## 2021-10-04 MED ORDER — SODIUM CHLORIDE (PF) 0.9 % IJ SOLN
INTRAMUSCULAR | Status: AC
  Filled 2021-10-04: qty 50

## 2021-10-04 MED ORDER — IOHEXOL 300 MG/ML  SOLN
100.0000 mL | Freq: Once | INTRAMUSCULAR | Status: AC | PRN
  Administered 2021-10-04: 100 mL via INTRAVENOUS

## 2021-10-07 ENCOUNTER — Other Ambulatory Visit: Payer: Self-pay

## 2021-10-07 ENCOUNTER — Inpatient Hospital Stay: Payer: BC Managed Care – PPO

## 2021-10-07 ENCOUNTER — Inpatient Hospital Stay: Payer: BC Managed Care – PPO | Attending: Oncology | Admitting: Hematology and Oncology

## 2021-10-07 DIAGNOSIS — C7951 Secondary malignant neoplasm of bone: Secondary | ICD-10-CM

## 2021-10-07 DIAGNOSIS — C771 Secondary and unspecified malignant neoplasm of intrathoracic lymph nodes: Secondary | ICD-10-CM | POA: Insufficient documentation

## 2021-10-07 DIAGNOSIS — C50919 Malignant neoplasm of unspecified site of unspecified female breast: Secondary | ICD-10-CM

## 2021-10-07 DIAGNOSIS — C50911 Malignant neoplasm of unspecified site of right female breast: Secondary | ICD-10-CM | POA: Insufficient documentation

## 2021-10-07 LAB — CBC WITH DIFFERENTIAL (CANCER CENTER ONLY)
Abs Immature Granulocytes: 0.01 10*3/uL (ref 0.00–0.07)
Basophils Absolute: 0 10*3/uL (ref 0.0–0.1)
Basophils Relative: 1 %
Eosinophils Absolute: 0.1 10*3/uL (ref 0.0–0.5)
Eosinophils Relative: 3 %
HCT: 34.3 % — ABNORMAL LOW (ref 36.0–46.0)
Hemoglobin: 11.2 g/dL — ABNORMAL LOW (ref 12.0–15.0)
Immature Granulocytes: 0 %
Lymphocytes Relative: 62 %
Lymphs Abs: 1.5 10*3/uL (ref 0.7–4.0)
MCH: 29.5 pg (ref 26.0–34.0)
MCHC: 32.7 g/dL (ref 30.0–36.0)
MCV: 90.3 fL (ref 80.0–100.0)
Monocytes Absolute: 0.1 10*3/uL (ref 0.1–1.0)
Monocytes Relative: 5 %
Neutro Abs: 0.7 10*3/uL — ABNORMAL LOW (ref 1.7–7.7)
Neutrophils Relative %: 29 %
Platelet Count: 244 10*3/uL (ref 150–400)
RBC: 3.8 MIL/uL — ABNORMAL LOW (ref 3.87–5.11)
RDW: 14.6 % (ref 11.5–15.5)
WBC Count: 2.4 10*3/uL — ABNORMAL LOW (ref 4.0–10.5)
nRBC: 0 % (ref 0.0–0.2)

## 2021-10-07 LAB — CMP (CANCER CENTER ONLY)
ALT: 10 U/L (ref 0–44)
AST: 15 U/L (ref 15–41)
Albumin: 4.4 g/dL (ref 3.5–5.0)
Alkaline Phosphatase: 58 U/L (ref 38–126)
Anion gap: 6 (ref 5–15)
BUN: 15 mg/dL (ref 6–20)
CO2: 26 mmol/L (ref 22–32)
Calcium: 10.2 mg/dL (ref 8.9–10.3)
Chloride: 106 mmol/L (ref 98–111)
Creatinine: 1.02 mg/dL — ABNORMAL HIGH (ref 0.44–1.00)
GFR, Estimated: >60 mL/min (ref >60)
Glucose, Bld: 108 mg/dL — ABNORMAL HIGH (ref 70–99)
Potassium: 4.1 mmol/L (ref 3.5–5.1)
Sodium: 138 mmol/L (ref 135–145)
Total Bilirubin: 0.3 mg/dL (ref 0.3–1.2)
Total Protein: 7.7 g/dL (ref 6.5–8.1)

## 2021-10-07 MED ORDER — DENOSUMAB 120 MG/1.7ML ~~LOC~~ SOLN
120.0000 mg | Freq: Once | SUBCUTANEOUS | Status: AC
  Administered 2021-10-07: 120 mg via SUBCUTANEOUS
  Filled 2021-10-07: qty 1.7

## 2021-10-07 NOTE — Assessment & Plan Note (Addendum)
Metastatic Breast Cancer: Current treatment: Anastrozole with Palbociclib started 05/11/20 Toxicities: 1. Neutropenia: Currently on palbociclib 75 mg dose.  ANC today 0.6.Marland Kitchen 2 weeks on 2 weeks off. Weight gain: I discussed with her about documenting her food intake with the help of an application like my plate.  Scans:  1. 05/08/21: Unchanged appearance of the ST mass in left orbit, diffuse BM uptake 2. 05/16/21: CT CAP: Stable diffuse bone mets, dec in size of liver lesion, no residual measurable celiac axis LN 3. 05/16/21: Bone scan: Stable bone mets 4. 10/05/21: Stable diffuse bone mets, stable Right Axillary and Bilateral Ext iliac LN  Radiology Review: Discussed the results of scans. Continue with current treatment

## 2021-10-08 ENCOUNTER — Encounter: Payer: Self-pay | Admitting: *Deleted

## 2021-10-08 LAB — CANCER ANTIGEN 27.29: CA 27.29: 18.7 U/mL (ref 0.0–38.6)

## 2021-10-08 NOTE — Progress Notes (Signed)
Received medical request from [place] requesting documentation to allow for refund of canceled trip at the beginning of the year.  RN successfully faxed letter requesting trip cancellation as well as recent office notes [phone]).

## 2021-10-09 DIAGNOSIS — Z6836 Body mass index (BMI) 36.0-36.9, adult: Secondary | ICD-10-CM | POA: Diagnosis not present

## 2021-10-09 DIAGNOSIS — E785 Hyperlipidemia, unspecified: Secondary | ICD-10-CM | POA: Diagnosis not present

## 2021-10-14 DIAGNOSIS — H34812 Central retinal vein occlusion, left eye, with macular edema: Secondary | ICD-10-CM | POA: Diagnosis not present

## 2021-10-28 ENCOUNTER — Other Ambulatory Visit: Payer: Self-pay | Admitting: *Deleted

## 2021-10-28 DIAGNOSIS — C50919 Malignant neoplasm of unspecified site of unspecified female breast: Secondary | ICD-10-CM

## 2021-10-28 DIAGNOSIS — C787 Secondary malignant neoplasm of liver and intrahepatic bile duct: Secondary | ICD-10-CM

## 2021-10-28 DIAGNOSIS — H0589 Other disorders of orbit: Secondary | ICD-10-CM

## 2021-10-30 ENCOUNTER — Encounter: Payer: Self-pay | Admitting: Oncology

## 2021-10-30 ENCOUNTER — Other Ambulatory Visit: Payer: Self-pay | Admitting: *Deleted

## 2021-10-30 DIAGNOSIS — H0589 Other disorders of orbit: Secondary | ICD-10-CM

## 2021-10-30 DIAGNOSIS — C50919 Malignant neoplasm of unspecified site of unspecified female breast: Secondary | ICD-10-CM

## 2021-10-30 NOTE — Progress Notes (Signed)
Received message from pt with complaint of ongoing severe headache x6 days.  Verbal orders received from MD to obtain STAT brain MRI for further evaluation.  Orders placed, appt will be scheduled once PA is obtained.

## 2021-10-31 ENCOUNTER — Ambulatory Visit
Admission: RE | Admit: 2021-10-31 | Discharge: 2021-10-31 | Disposition: A | Discharge status: Home / Self Care | Payer: BC Managed Care – PPO | Source: Ambulatory Visit | Attending: Hematology and Oncology | Admitting: Hematology and Oncology

## 2021-10-31 DIAGNOSIS — C7951 Secondary malignant neoplasm of bone: Secondary | ICD-10-CM | POA: Diagnosis not present

## 2021-10-31 DIAGNOSIS — H0589 Other disorders of orbit: Secondary | ICD-10-CM

## 2021-10-31 DIAGNOSIS — J3489 Other specified disorders of nose and nasal sinuses: Secondary | ICD-10-CM | POA: Diagnosis not present

## 2021-10-31 DIAGNOSIS — C50919 Malignant neoplasm of unspecified site of unspecified female breast: Secondary | ICD-10-CM

## 2021-10-31 DIAGNOSIS — Z853 Personal history of malignant neoplasm of breast: Secondary | ICD-10-CM | POA: Diagnosis not present

## 2021-10-31 DIAGNOSIS — R519 Headache, unspecified: Secondary | ICD-10-CM | POA: Diagnosis not present

## 2021-10-31 MED ORDER — GADOBENATE DIMEGLUMINE 529 MG/ML IV SOLN
20.0000 mL | Freq: Once | INTRAVENOUS | Status: AC | PRN
  Administered 2021-10-31: 20 mL via INTRAVENOUS

## 2021-11-01 ENCOUNTER — Telehealth: Payer: Self-pay

## 2021-11-01 NOTE — Telephone Encounter (Signed)
Called pt regarding MRI brain. Pt reports her h/a has subsided "for the most part" and she asks if being out in the heat/sun all day could have caused a h/a. Advised pt anytime there is a prolonged amount of sunlight exposure you can develop a h/a. She also reports she had just had "fresh braids" put in her hair that "were very tight", so she expects that may have also been the cause of her h/a. Pt is aware she should limit sunlight exposure, and use polarized sunglasses and protective headwear to avoid sunburn. She voiced understanding and knows to call with any further concerns.

## 2021-11-06 ENCOUNTER — Other Ambulatory Visit: Payer: Self-pay | Admitting: *Deleted

## 2021-11-06 ENCOUNTER — Telehealth: Payer: Self-pay | Admitting: Surgery

## 2021-11-06 ENCOUNTER — Encounter: Payer: Self-pay | Admitting: Hematology and Oncology

## 2021-11-06 DIAGNOSIS — C50919 Malignant neoplasm of unspecified site of unspecified female breast: Secondary | ICD-10-CM

## 2021-11-06 NOTE — Telephone Encounter (Signed)
I called the pt to give her the appointment time for her breast MRI scheduled at [facility].  The pt is scheduled for November 20, 2021, at 4:30.  The pt was instructed to eat a light mean, and to wear no deodorant, lotion, or perfume.  The pt verbalized understanding of this appointment and was told to call our office if she had any questions or concerns.

## 2021-11-06 NOTE — Progress Notes (Signed)
Pt request breast MRI to evaluate integrity of bilateral implants.  Orders placed, appt will be scheduled once PA obtained.

## 2021-11-11 DIAGNOSIS — H3582 Retinal ischemia: Secondary | ICD-10-CM | POA: Diagnosis not present

## 2021-11-11 DIAGNOSIS — H47392 Other disorders of optic disc, left eye: Secondary | ICD-10-CM | POA: Diagnosis not present

## 2021-11-11 DIAGNOSIS — H34812 Central retinal vein occlusion, left eye, with macular edema: Secondary | ICD-10-CM | POA: Diagnosis not present

## 2021-11-20 ENCOUNTER — Other Ambulatory Visit: Payer: BC Managed Care – PPO

## 2021-11-20 ENCOUNTER — Encounter: Payer: Self-pay | Admitting: Oncology

## 2021-12-02 DIAGNOSIS — H34812 Central retinal vein occlusion, left eye, with macular edema: Secondary | ICD-10-CM | POA: Diagnosis not present

## 2021-12-09 ENCOUNTER — Inpatient Hospital Stay: Admission: RE | Admit: 2021-12-09 | Payer: BC Managed Care – PPO | Source: Ambulatory Visit

## 2021-12-18 ENCOUNTER — Other Ambulatory Visit: Payer: Self-pay | Admitting: *Deleted

## 2021-12-18 ENCOUNTER — Encounter: Payer: Self-pay | Admitting: Hematology and Oncology

## 2021-12-18 ENCOUNTER — Encounter: Payer: Self-pay | Admitting: *Deleted

## 2021-12-18 DIAGNOSIS — G5600 Carpal tunnel syndrome, unspecified upper limb: Secondary | ICD-10-CM

## 2021-12-18 NOTE — Progress Notes (Signed)
Received message from [facility] stating Dr. Tempie Donning will be transferring to [facility].  Pt requesting to continue to f/u with MD.  RN successfully faxed referral to Dr. Tempie Donning at [facility] [phone]).

## 2021-12-26 ENCOUNTER — Encounter: Payer: Self-pay | Admitting: Hematology and Oncology

## 2021-12-27 ENCOUNTER — Other Ambulatory Visit: Payer: Self-pay | Admitting: *Deleted

## 2021-12-27 MED ORDER — ANASTROZOLE 1 MG PO TABS: 1.0000 mg | ORAL_TABLET | Freq: Every day | ORAL | 4 refills | Status: AC

## 2022-01-02 NOTE — Progress Notes (Signed)
Patient Care Team: Nicholas Lose, MD as PCP - General (Hematology and Oncology) Gavin Pound, MD as Consulting Physician (Rheumatology) Katy Fitch, Darlina Guys, MD as Consulting Physician (Ophthalmology) Kyung Rudd, MD as Consulting Physician (Radiation Oncology) Jalene Mullet, MD as Consulting Physician (Ophthalmology) Nicholas Lose, MD as Consulting Physician (Hematology and Oncology)  DIAGNOSIS: No diagnosis found.  SUMMARY OF ONCOLOGIC HISTORY: Oncology History  Breast cancer metastasized to intrathoracic lymph node ([place])  01/02/2010 Initial Biopsy   bilateral mastectomies with left axillary lymph node sampling [6 nodes removed] 01/02/2010 for a pT2 pN0(i+), stage IIA invasive lobular breast cancer, grade 3, estrogen and progesterone receptor positive, HER-2 not amplified   2011 Oncotype testing   Oncotype DX score of 22 predicted a 14% risk of distant recurrence within 10 years if the patient's only systemic treatment is tamoxifen for 5 years   01/2010 - 06/2010 Chemotherapy   CMF(cyclophosphamide, fluorouracil, methotrexate) x7 given between October of 2011 and March of 2012 (7 of 8 planned treatments completed)   07/2010 - 10/2011 Anti-estrogen oral therapy   tamoxifen started April 2012, discontinued approximately July of 2013 (about 15 months) because of problems with hot flashes and insomnia   05/11/2013 Initial Diagnosis   pathologically documented left axillary recurrence 05/05/2013, the tumor being again estrogen and progesterone receptor positive, with HER-2 not amplified, and MIB-1 of 15%. Staging CT/PET 05/27/2013 show left axillary and supraclavicular recurrence, no distant disease   06/16/2013 Surgery   completion left axillary lymph node dissection 06/16/2013 showing a total of 10/15 lymph nodes involved by tumor, with evidence of extracapsular extension (TX N3 = stage IIIC)   07/11/2013 - 09/12/2013 Chemotherapy   Carboplatin/ docetaxel, first dose on 07/11/2013,  repeated every 21 days x 4 with Neulasta support on day 2.  Final dose was given on 09/12/2013, with chemotherapy discontinued after 4 cycles due to continuing low counts   10/2013 - 12/09/2013 Radiation Therapy   Left chest wall / 50.4 Gray @ 1.8 Pearline Cables per fraction x 28 fractions Left Supraclavicular fossa and PAB/ 45 Gray @1 .8 Gray per fraction x 25 fractions Left scar / 10 Gray at [employer] per fraction x 5 fractions       01/25/2014 Surgery   bilateral salpingo-oophorectomy on 01/25/14   12/2013 Miscellaneous   sequencing and deletion/duplication analysis of 17 genes performed September 2015 at [facility] showed no deleterious mutations in  ATM, BARD1, BRCA1, BRCA2, BRIP1, CDH1, CHEK2, MRE11A, MUTYH, NBN, NF1, PALB2, PTEN, RAD50, RAD51C, RAD51D, and TP53   02/27/2014 - 03/2020 Anti-estrogen oral therapy   tamoxifen re-started 02/27/2014, discontinued December 2021 with progression   04/23/2020 Relapse/Recurrence   bone marrow biopsy 04/23/2020 shows metastatic carcinoma, estrogen and progesterone receptor positive             (a) MRI of the cervical spine 04/07/2020 shows markedly abnormal marrow signal, prominent right axillary lymph nodes, degenerative disc disease             (b) MRI of the orbits 04/13/2020 suggests metastatic disease around the left orbit and diffusely abnormal bone marrow signal             (c) CT scan of the chest, abdomen and pelvis 05/10/2020 shows extensive adenopathy (right axillary, prevascular, right hilar, celiac and portacaval axes), multiple liver masses, multiple sclerotic bone lesions   04/2020 - 05/31/2020 Radiation Therapy   radiation to the left periorbital region completed 05/31/2020   05/01/2020 -  Anti-estrogen oral therapy   anastrozole started 05/01/2020 palbociclib  started 05/11/2020 denosumab/ Xgeva started 07/05/2020     CHIEF COMPLIANT: Follow-up of stage IV breast cancer,  INTERVAL HISTORY: [name] is a 46 y.o. with above-mentioned  history of stage IV breast cancer, estrogen receptor positive, currently on anastrozole. She presents to the clinic today for follow-up.    ALLERGIES:  is allergic to sulfa antibiotics, gadolinium derivatives, and petrolatum.  MEDICATIONS:  Current Outpatient Medications  Medication Sig Dispense Refill   anastrozole (ARIMIDEX) 1 MG tablet Take 1 tablet (1 mg total) by mouth daily. 90 tablet 4   famotidine (PEPCID) 20 MG tablet Take 1 tablet (20 mg total) by mouth 2 (two) times daily. 60 tablet 6   LORazepam (ATIVAN) 0.5 MG tablet Take 1 tablet (0.5 mg total) by mouth every 8 (eight) hours. 30 tablet 0   ofloxacin (OCUFLOX) 0.3 % ophthalmic solution SMARTSIG:1 Drop(s) Left Eye Every 4 Hours     palbociclib (IBRANCE) 75 MG tablet Take 1 tablet (75 mg total) by mouth daily. Take for 14 days on, 14 days off, repeat every 28 days. 14 tablet 6   No current facility-administered medications for this visit.    PHYSICAL EXAMINATION: ECOG PERFORMANCE STATUS: {CHL ONC ECOG [phone]}  There were no vitals filed for this visit. There were no vitals filed for this visit.  BREAST:*** No palpable masses or nodules in either right or left breasts. No palpable axillary supraclavicular or infraclavicular adenopathy no breast tenderness or nipple discharge. (exam performed in the presence of a chaperone)  LABORATORY DATA:  I have reviewed the data as listed    Latest Ref Rng & Units 10/07/2021    8:22 AM 08/26/2021    8:46 AM 07/15/2021    8:41 AM  CMP  Glucose 70 - 99 mg/dL 108  112  95   BUN 6 - 20 mg/dL 15  13  11    Creatinine 0.44 - 1.00 mg/dL 1.02  0.87  0.92   Sodium 135 - 145 mmol/L 138  141  141   Potassium 3.5 - 5.1 mmol/L 4.1  4.0  3.9   Chloride 98 - 111 mmol/L 106  109  107   CO2 22 - 32 mmol/L 26  28  27    Calcium 8.9 - 10.3 mg/dL 10.2  9.9  10.3   Total Protein 6.5 - 8.1 g/dL 7.7  7.5  7.9   Total Bilirubin 0.3 - 1.2 mg/dL 0.3  0.3  0.3   Alkaline Phos 38 - 126 U/L 58  62  64    AST 15 - 41 U/L 15  17  15    ALT 0 - 44 U/L 10  13  11      Lab Results  Component Value Date   WBC 2.4 (L) 10/07/2021   HGB 11.2 (L) 10/07/2021   HCT 34.3 (L) 10/07/2021   MCV 90.3 10/07/2021   PLT 244 10/07/2021   NEUTROABS 0.7 (L) 10/07/2021    ASSESSMENT & PLAN:  No problem-specific Assessment & Plan notes found for this encounter.    No orders of the defined types were placed in this encounter.  The patient has a good understanding of the overall plan. she agrees with it. she will call with any problems that may develop before the next visit here. Total time spent: 30 mins including face to face time and time spent for planning, charting and co-ordination of care   Suzzette Righter, [place] 01/02/22    I Gardiner Coins am scribing for Dr. Lindi Adie  ***

## 2022-01-06 ENCOUNTER — Other Ambulatory Visit: Payer: Self-pay

## 2022-01-06 ENCOUNTER — Inpatient Hospital Stay: Payer: BC Managed Care – PPO

## 2022-01-06 ENCOUNTER — Ambulatory Visit
Admission: RE | Admit: 2022-01-06 | Discharge: 2022-01-06 | Disposition: A | Discharge status: Home / Self Care | Payer: BC Managed Care – PPO | Source: Ambulatory Visit | Attending: Hematology and Oncology | Admitting: Hematology and Oncology

## 2022-01-06 ENCOUNTER — Inpatient Hospital Stay: Payer: BC Managed Care – PPO | Attending: Oncology | Admitting: Hematology and Oncology

## 2022-01-06 VITALS — BP 134/86 | HR 73 | Temp 97.5°F | Resp 18 | Ht 68.0 in | Wt 227.6 lb

## 2022-01-06 DIAGNOSIS — Z853 Personal history of malignant neoplasm of breast: Secondary | ICD-10-CM | POA: Diagnosis not present

## 2022-01-06 DIAGNOSIS — C771 Secondary and unspecified malignant neoplasm of intrathoracic lymph nodes: Secondary | ICD-10-CM | POA: Diagnosis not present

## 2022-01-06 DIAGNOSIS — C50919 Malignant neoplasm of unspecified site of unspecified female breast: Secondary | ICD-10-CM

## 2022-01-06 DIAGNOSIS — C7951 Secondary malignant neoplasm of bone: Secondary | ICD-10-CM

## 2022-01-06 DIAGNOSIS — C787 Secondary malignant neoplasm of liver and intrahepatic bile duct: Secondary | ICD-10-CM | POA: Diagnosis not present

## 2022-01-06 DIAGNOSIS — C50911 Malignant neoplasm of unspecified site of right female breast: Secondary | ICD-10-CM | POA: Diagnosis not present

## 2022-01-06 DIAGNOSIS — H34812 Central retinal vein occlusion, left eye, with macular edema: Secondary | ICD-10-CM | POA: Diagnosis not present

## 2022-01-06 LAB — CBC WITH DIFFERENTIAL (CANCER CENTER ONLY)
Abs Immature Granulocytes: 0.01 10*3/uL (ref 0.00–0.07)
Basophils Absolute: 0 10*3/uL (ref 0.0–0.1)
Basophils Relative: 1 %
Eosinophils Absolute: 0.1 10*3/uL (ref 0.0–0.5)
Eosinophils Relative: 2 %
HCT: 36.4 % (ref 36.0–46.0)
Hemoglobin: 11.7 g/dL — ABNORMAL LOW (ref 12.0–15.0)
Immature Granulocytes: 0 %
Lymphocytes Relative: 57 %
Lymphs Abs: 1.9 10*3/uL (ref 0.7–4.0)
MCH: 28.7 pg (ref 26.0–34.0)
MCHC: 32.1 g/dL (ref 30.0–36.0)
MCV: 89.2 fL (ref 80.0–100.0)
Monocytes Absolute: 0.3 10*3/uL (ref 0.1–1.0)
Monocytes Relative: 9 %
Neutro Abs: 1 10*3/uL — ABNORMAL LOW (ref 1.7–7.7)
Neutrophils Relative %: 31 %
Platelet Count: 165 10*3/uL (ref 150–400)
RBC: 4.08 MIL/uL (ref 3.87–5.11)
RDW: 15.2 % (ref 11.5–15.5)
WBC Count: 3.3 10*3/uL — ABNORMAL LOW (ref 4.0–10.5)
nRBC: 0 % (ref 0.0–0.2)

## 2022-01-06 LAB — CMP (CANCER CENTER ONLY)
ALT: 10 U/L (ref 0–44)
AST: 14 U/L — ABNORMAL LOW (ref 15–41)
Albumin: 4.6 g/dL (ref 3.5–5.0)
Alkaline Phosphatase: 61 U/L (ref 38–126)
Anion gap: 4 — ABNORMAL LOW (ref 5–15)
BUN: 9 mg/dL (ref 6–20)
CO2: 29 mmol/L (ref 22–32)
Calcium: 10.4 mg/dL — ABNORMAL HIGH (ref 8.9–10.3)
Chloride: 106 mmol/L (ref 98–111)
Creatinine: 0.75 mg/dL (ref 0.44–1.00)
GFR, Estimated: >60 mL/min (ref >60)
Glucose, Bld: 121 mg/dL — ABNORMAL HIGH (ref 70–99)
Potassium: 3.9 mmol/L (ref 3.5–5.1)
Sodium: 139 mmol/L (ref 135–145)
Total Bilirubin: 0.3 mg/dL (ref 0.3–1.2)
Total Protein: 8 g/dL (ref 6.5–8.1)

## 2022-01-06 MED ORDER — DENOSUMAB 120 MG/1.7ML ~~LOC~~ SOLN
120.0000 mg | Freq: Once | SUBCUTANEOUS | Status: AC
  Administered 2022-01-06: 120 mg via SUBCUTANEOUS
  Filled 2022-01-06: qty 1.7

## 2022-01-06 MED ORDER — GADOBUTROL 1 MMOL/ML IV SOLN
10.0000 mL | Freq: Once | INTRAVENOUS | Status: AC | PRN
  Administered 2022-01-06: 10 mL via INTRAVENOUS

## 2022-01-06 NOTE — Assessment & Plan Note (Addendum)
Metastatic Breast Cancer: Current treatment: Anastrozole with Palbociclib started 05/11/20 Toxicities: 1. Neutropenia: Currently on palbociclib 75 mg dose.  2 weeks on 2 weeks off. Weight gain: I discussed with her about documenting her food intake with the help of an application like my plate.  Scans:  1. 05/08/21: Unchanged appearance of the ST mass in left orbit, diffuse BM uptake 2. 05/16/21: CT CAP: Stable diffuse bone mets, dec in size of liver lesion, no residual measurable celiac axis LN 3. 05/16/21: Bone scan: Stable bone mets 4. 10/05/21: Stable diffuse bone mets, stable Right Axillary and Bilateral Ext iliac LN   Labs and Xgeva and MD visit in 3 months and scans in 3 months. Continue with current treatment and dosage of Ibrance

## 2022-01-07 LAB — CANCER ANTIGEN 27.29: CA 27.29: 13.5 U/mL (ref 0.0–38.6)

## 2022-01-08 ENCOUNTER — Other Ambulatory Visit: Payer: Self-pay | Admitting: Hematology and Oncology

## 2022-01-08 ENCOUNTER — Ambulatory Visit
Admission: RE | Admit: 2022-01-08 | Discharge: 2022-01-08 | Disposition: A | Discharge status: Home / Self Care | Payer: BC Managed Care – PPO | Source: Ambulatory Visit | Attending: Hematology and Oncology | Admitting: Hematology and Oncology

## 2022-01-08 DIAGNOSIS — C50919 Malignant neoplasm of unspecified site of unspecified female breast: Secondary | ICD-10-CM

## 2022-01-14 ENCOUNTER — Other Ambulatory Visit: Payer: BC Managed Care – PPO

## 2022-01-21 DIAGNOSIS — M654 Radial styloid tenosynovitis [de Quervain]: Secondary | ICD-10-CM | POA: Diagnosis not present

## 2022-01-28 ENCOUNTER — Other Ambulatory Visit: Payer: Self-pay | Admitting: Hematology and Oncology

## 2022-01-28 DIAGNOSIS — Z170 Estrogen receptor positive status [ER+]: Secondary | ICD-10-CM

## 2022-01-29 ENCOUNTER — Other Ambulatory Visit (HOSPITAL_COMMUNITY): Payer: Self-pay

## 2022-01-29 ENCOUNTER — Encounter: Payer: Self-pay | Admitting: Hematology and Oncology

## 2022-02-24 ENCOUNTER — Other Ambulatory Visit: Payer: Self-pay | Admitting: Hematology and Oncology

## 2022-02-24 DIAGNOSIS — Z170 Estrogen receptor positive status [ER+]: Secondary | ICD-10-CM

## 2022-02-26 DIAGNOSIS — H34812 Central retinal vein occlusion, left eye, with macular edema: Secondary | ICD-10-CM | POA: Diagnosis not present

## 2022-03-18 ENCOUNTER — Other Ambulatory Visit: Payer: Self-pay | Admitting: Hematology and Oncology

## 2022-03-18 DIAGNOSIS — C50812 Malignant neoplasm of overlapping sites of left female breast: Secondary | ICD-10-CM

## 2022-03-24 ENCOUNTER — Encounter: Payer: Self-pay | Admitting: *Deleted

## 2022-03-24 ENCOUNTER — Encounter: Payer: Self-pay | Admitting: Hematology and Oncology

## 2022-03-25 ENCOUNTER — Other Ambulatory Visit (HOSPITAL_COMMUNITY): Payer: Self-pay

## 2022-03-27 ENCOUNTER — Other Ambulatory Visit: Payer: Self-pay | Admitting: *Deleted

## 2022-03-27 NOTE — Progress Notes (Signed)
Received call from pt requesting advice from RN as to why a bone scan and MRI of the orbits has been ordered for December.  PT states she is not due for a bone scan at this time and had an MRI of the orbits in July of 2023.  Looking at orders, the orders were placed last year by Dr. Doris Cheadle.  Pt requesting to cancel orders at this time stating she will f/u with MD in December and discuss future scans at that time.

## 2022-03-28 ENCOUNTER — Encounter: Payer: Self-pay | Admitting: Hematology and Oncology

## 2022-03-28 ENCOUNTER — Other Ambulatory Visit (HOSPITAL_COMMUNITY): Payer: Self-pay

## 2022-04-01 NOTE — Progress Notes (Signed)
Patient Care Team: Nicholas Lose, MD as PCP - General (Hematology and Oncology) Gavin Pound, MD as Consulting Physician (Rheumatology) Katy Fitch, Darlina Guys, MD as Consulting Physician (Ophthalmology) Kyung Rudd, MD as Consulting Physician (Radiation Oncology) Jalene Mullet, MD as Consulting Physician (Ophthalmology) Nicholas Lose, MD as Consulting Physician (Hematology and Oncology)  DIAGNOSIS:  Encounter Diagnosis  Name Primary?   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 ([place]) Yes    SUMMARY OF ONCOLOGIC HISTORY: Oncology History  Breast cancer metastasized to intrathoracic lymph node ([place])  01/02/2010 Initial Biopsy   bilateral mastectomies with left axillary lymph node sampling [6 nodes removed] 01/02/2010 for a pT2 pN0(i+), stage IIA invasive lobular breast cancer, grade 3, estrogen and progesterone receptor positive, HER-2 not amplified   2011 Oncotype testing   Oncotype DX score of 22 predicted a 14% risk of distant recurrence within 10 years if the patient's only systemic treatment is tamoxifen for 5 years   01/2010 - 06/2010 Chemotherapy   CMF(cyclophosphamide, fluorouracil, methotrexate) x7 given between October of 2011 and March of 2012 (7 of 8 planned treatments completed)   07/2010 - 10/2011 Anti-estrogen oral therapy   tamoxifen started April 2012, discontinued approximately July of 2013 (about 15 months) because of problems with hot flashes and insomnia   05/11/2013 Initial Diagnosis   pathologically documented left axillary recurrence 05/05/2013, the tumor being again estrogen and progesterone receptor positive, with HER-2 not amplified, and MIB-1 of 15%. Staging CT/PET 05/27/2013 show left axillary and supraclavicular recurrence, no distant disease   06/16/2013 Surgery   completion left axillary lymph node dissection 06/16/2013 showing a total of 10/15 lymph nodes involved by tumor, with evidence of extracapsular extension (TX N3 = stage  IIIC)   07/11/2013 - 09/12/2013 Chemotherapy   Carboplatin/ docetaxel, first dose on 07/11/2013, repeated every 21 days x 4 with Neulasta support on day 2.  Final dose was given on 09/12/2013, with chemotherapy discontinued after 4 cycles due to continuing low counts   10/2013 - 12/09/2013 Radiation Therapy   Left chest wall / 50.4 Gray @ 1.8 Pearline Cables per fraction x 28 fractions Left Supraclavicular fossa and PAB/ 57 Gray _0 .8 Gray per fraction x 25 fractions Left scar / 10 Gray at [employer] per fraction x 5 fractions       01/25/2014 Surgery   bilateral salpingo-oophorectomy on 01/25/14   12/2013 Miscellaneous   sequencing and deletion/duplication analysis of 17 genes performed September 2015 at [facility] showed no deleterious mutations in  ATM, BARD1, BRCA1, BRCA2, BRIP1, CDH1, CHEK2, MRE11A, MUTYH, NBN, NF1, PALB2, PTEN, RAD50, RAD51C, RAD51D, and TP53   02/27/2014 - 03/2020 Anti-estrogen oral therapy   tamoxifen re-started 02/27/2014, discontinued December 2021 with progression   04/23/2020 Relapse/Recurrence   bone marrow biopsy 04/23/2020 shows metastatic carcinoma, estrogen and progesterone receptor positive             (a) MRI of the cervical spine 04/07/2020 shows markedly abnormal marrow signal, prominent right axillary lymph nodes, degenerative disc disease             (b) MRI of the orbits 04/13/2020 suggests metastatic disease around the left orbit and diffusely abnormal bone marrow signal             (c) CT scan of the chest, abdomen and pelvis 05/10/2020 shows extensive adenopathy (right axillary, prevascular, right hilar, celiac and portacaval axes), multiple liver masses, multiple sclerotic bone lesions   04/2020 - 05/31/2020 Radiation Therapy   radiation to the left  periorbital region completed 05/31/2020   05/01/2020 -  Anti-estrogen oral therapy   anastrozole started 05/01/2020 palbociclib started 05/11/2020 denosumab/ Xgeva started 07/05/2020     CHIEF COMPLIANT: Follow-up of  metastatic breast cancer    INTERVAL HISTORY: [name] is a 46 y.o. with above-mentioned history of stage IV breast cancer, estrogen receptor positive, currently on anastrozole. She presents to the clinic today for follow-up to discuss scans. She states that she able to tolerate the Ibrance with no symptoms or side effects.   ALLERGIES:  is allergic to sulfa antibiotics, gadolinium derivatives, and petrolatum.  MEDICATIONS:  Current Outpatient Medications  Medication Sig Dispense Refill   anastrozole (ARIMIDEX) 1 MG tablet Take 1 tablet (1 mg total) by mouth daily. 90 tablet 4   famotidine (PEPCID) 20 MG tablet Take 1 tablet (20 mg total) by mouth 2 (two) times daily. 60 tablet 6   famotidine (PEPCID) 20 MG tablet Take 1 tablet (20 mg total) by mouth 2 (two) times daily. 30 tablet 3   IBRANCE 75 MG tablet TAKE 1 TABLET BY MOUTH DAILY FOR 14 DAYS ON, THEN 14 DAYS OFF, REPEAT EVERY 28 DAYS. 14 tablet 0   LORazepam (ATIVAN) 0.5 MG tablet Take 1 tablet (0.5 mg total) by mouth every 8 (eight) hours. 30 tablet 0   ofloxacin (OCUFLOX) 0.3 % ophthalmic solution SMARTSIG:1 Drop(s) Left Eye Every 4 Hours     palbociclib (IBRANCE) 75 MG tablet Take 1 tablet (75 mg total) by mouth daily. Take for 14 days on, 14 days off, repeat every 28 days. 14 tablet 0   potassium chloride (MICRO-K) 10 MEQ CR capsule      No current facility-administered medications for this visit.    PHYSICAL EXAMINATION: ECOG PERFORMANCE STATUS: 1 - Symptomatic but completely ambulatory  Vitals:   04/07/22 0825  BP: 129/83  Pulse: 89  Resp: 18  Temp: (!) 97.3 F (36.3 C)  SpO2: 99%   Filed Weights   04/07/22 0825  Weight: 231 lb 6.4 oz (105 kg)      LABORATORY DATA:  I have reviewed the data as listed    Latest Ref Rng & Units 04/07/2022    8:04 AM 01/06/2022    8:17 AM 10/07/2021    8:22 AM  CMP  Glucose 70 - 99 mg/dL 108  121  108   BUN 6 - 20 mg/dL _0 Creatinine 0.44 - 1.00 mg/dL 0.75   0.75  1.02   Sodium 135 - 145 mmol/L 141  139  138   Potassium 3.5 - 5.1 mmol/L 4.0  3.9  4.1   Chloride 98 - 111 mmol/L 109  106  106   CO2 22 - 32 mmol/L _1 Calcium 8.9 - 10.3 mg/dL 10.1  10.4  10.2   Total Protein 6.5 - 8.1 g/dL 7.5  8.0  7.7   Total Bilirubin 0.3 - 1.2 mg/dL 0.2  0.3  0.3   Alkaline Phos 38 - 126 U/L 65  61  58   AST 15 - 41 U/L _2 ALT 0 - 44 U/L _3 Lab Results  Component Value Date   WBC 4.2 04/07/2022   HGB 11.5 (L) 04/07/2022   HCT 35.6 (L) 04/07/2022   MCV 89.7 04/07/2022   PLT 270 04/07/2022   NEUTROABS 1.5 (L) 04/07/2022    ASSESSMENT & PLAN:  Breast cancer metastasized to intrathoracic lymph node ([place]) Metastatic Breast Cancer: Current treatment: Anastrozole with Palbociclib started 05/11/20 Toxicities: Neutropenia: Currently on palbociclib 75 mg dose.   2 weeks on 2 weeks off. Weight gain: I discussed with her about documenting her food intake with the help of an application like my plate.   Scans:  1. 05/08/21: Unchanged appearance of the ST mass in left orbit, diffuse BM uptake 2. 05/16/21: CT CAP: Stable diffuse bone mets, dec in size of liver lesion, no residual measurable celiac axis LN 3. 05/16/21: Bone scan: Stable bone mets 4. 10/05/21: Stable diffuse bone mets, stable Right Axillary and Bilateral Ext iliac LN 5.  10/31/2021: MRI brain: No intracranial mets.  Similar residual infiltrative left orbital soft tissue, known bone metastasis 6.  01/09/2022: MRI breast: Benign, intact silicone implants 7.  89/79/1504: CT CAP: Unchanged bone metastases, unchanged right axillary and external iliac lymph nodes, unchanged metastatic lesion posterior liver   Labs and Xgeva and MD visit in 3 months and scans in 6 months. Continue with current treatment and dosage of Ibrance    No orders of the defined types were placed in this encounter.  The patient has a good understanding of the overall plan. she agrees with it. she  will call with any problems that may develop before the next visit here. Total time spent: 30 mins including face to face time and time spent for planning, charting and co-ordination of care   Harriette Ohara, MD 04/07/22    I Gardiner Coins am scribing for Dr. Lindi Adie  I have reviewed the above documentation for accuracy and completeness, and I agree with the above.

## 2022-04-03 ENCOUNTER — Encounter: Payer: Self-pay | Admitting: Hematology and Oncology

## 2022-04-03 ENCOUNTER — Other Ambulatory Visit: Payer: Self-pay | Admitting: *Deleted

## 2022-04-03 MED ORDER — PALBOCICLIB 75 MG PO TABS: 75.0000 mg | ORAL_TABLET | Freq: Every day | ORAL | 0 refills | Status: DC

## 2022-04-03 MED ORDER — FAMOTIDINE 20 MG PO TABS: 20.0000 mg | ORAL_TABLET | Freq: Two times a day (BID) | ORAL | 3 refills | Status: DC

## 2022-04-04 ENCOUNTER — Ambulatory Visit (HOSPITAL_COMMUNITY)
Admission: RE | Admit: 2022-04-04 | Discharge: 2022-04-04 | Disposition: A | Discharge status: Home / Self Care | Payer: BC Managed Care – PPO | Source: Ambulatory Visit | Attending: Hematology and Oncology | Admitting: Hematology and Oncology

## 2022-04-04 DIAGNOSIS — C787 Secondary malignant neoplasm of liver and intrahepatic bile duct: Secondary | ICD-10-CM | POA: Insufficient documentation

## 2022-04-04 DIAGNOSIS — N281 Cyst of kidney, acquired: Secondary | ICD-10-CM | POA: Diagnosis not present

## 2022-04-04 DIAGNOSIS — K8020 Calculus of gallbladder without cholecystitis without obstruction: Secondary | ICD-10-CM | POA: Diagnosis not present

## 2022-04-04 DIAGNOSIS — D259 Leiomyoma of uterus, unspecified: Secondary | ICD-10-CM | POA: Diagnosis not present

## 2022-04-04 DIAGNOSIS — J941 Fibrothorax: Secondary | ICD-10-CM | POA: Diagnosis not present

## 2022-04-04 DIAGNOSIS — J701 Chronic and other pulmonary manifestations due to radiation: Secondary | ICD-10-CM | POA: Diagnosis not present

## 2022-04-04 DIAGNOSIS — C771 Secondary and unspecified malignant neoplasm of intrathoracic lymph nodes: Secondary | ICD-10-CM | POA: Insufficient documentation

## 2022-04-04 DIAGNOSIS — C50911 Malignant neoplasm of unspecified site of right female breast: Secondary | ICD-10-CM

## 2022-04-04 DIAGNOSIS — C7951 Secondary malignant neoplasm of bone: Secondary | ICD-10-CM | POA: Diagnosis not present

## 2022-04-04 MED ORDER — IOHEXOL 300 MG/ML  SOLN
100.0000 mL | Freq: Once | INTRAMUSCULAR | Status: AC | PRN
  Administered 2022-04-04: 100 mL via INTRAVENOUS

## 2022-04-04 MED ORDER — SODIUM CHLORIDE (PF) 0.9 % IJ SOLN
INTRAMUSCULAR | Status: AC
  Filled 2022-04-04: qty 50

## 2022-04-07 ENCOUNTER — Inpatient Hospital Stay: Payer: BC Managed Care – PPO | Attending: Oncology | Admitting: Hematology and Oncology

## 2022-04-07 ENCOUNTER — Inpatient Hospital Stay: Payer: BC Managed Care – PPO

## 2022-04-07 ENCOUNTER — Encounter: Payer: Self-pay | Admitting: Oncology

## 2022-04-07 ENCOUNTER — Encounter: Payer: Self-pay | Admitting: Hematology and Oncology

## 2022-04-07 VITALS — BP 129/83 | HR 89 | Temp 97.3°F | Resp 18 | Ht 68.0 in | Wt 231.4 lb

## 2022-04-07 DIAGNOSIS — C50919 Malignant neoplasm of unspecified site of unspecified female breast: Secondary | ICD-10-CM

## 2022-04-07 DIAGNOSIS — C7951 Secondary malignant neoplasm of bone: Secondary | ICD-10-CM | POA: Diagnosis not present

## 2022-04-07 DIAGNOSIS — C50911 Malignant neoplasm of unspecified site of right female breast: Secondary | ICD-10-CM | POA: Insufficient documentation

## 2022-04-07 DIAGNOSIS — C771 Secondary and unspecified malignant neoplasm of intrathoracic lymph nodes: Secondary | ICD-10-CM | POA: Diagnosis not present

## 2022-04-07 LAB — CBC WITH DIFFERENTIAL (CANCER CENTER ONLY)
Abs Immature Granulocytes: 0.02 10*3/uL (ref 0.00–0.07)
Basophils Absolute: 0.1 10*3/uL (ref 0.0–0.1)
Basophils Relative: 1 %
Eosinophils Absolute: 0.2 10*3/uL (ref 0.0–0.5)
Eosinophils Relative: 4 %
HCT: 35.6 % — ABNORMAL LOW (ref 36.0–46.0)
Hemoglobin: 11.5 g/dL — ABNORMAL LOW (ref 12.0–15.0)
Immature Granulocytes: 1 %
Lymphocytes Relative: 52 %
Lymphs Abs: 2.2 10*3/uL (ref 0.7–4.0)
MCH: 29 pg (ref 26.0–34.0)
MCHC: 32.3 g/dL (ref 30.0–36.0)
MCV: 89.7 fL (ref 80.0–100.0)
Monocytes Absolute: 0.3 10*3/uL (ref 0.1–1.0)
Monocytes Relative: 7 %
Neutro Abs: 1.5 10*3/uL — ABNORMAL LOW (ref 1.7–7.7)
Neutrophils Relative %: 35 %
Platelet Count: 270 10*3/uL (ref 150–400)
RBC: 3.97 MIL/uL (ref 3.87–5.11)
RDW: 16.4 % — ABNORMAL HIGH (ref 11.5–15.5)
WBC Count: 4.2 10*3/uL (ref 4.0–10.5)
nRBC: 0 % (ref 0.0–0.2)

## 2022-04-07 LAB — CMP (CANCER CENTER ONLY)
ALT: 13 U/L (ref 0–44)
AST: 18 U/L (ref 15–41)
Albumin: 4.4 g/dL (ref 3.5–5.0)
Alkaline Phosphatase: 65 U/L (ref 38–126)
Anion gap: 6 (ref 5–15)
BUN: 8 mg/dL (ref 6–20)
CO2: 26 mmol/L (ref 22–32)
Calcium: 10.1 mg/dL (ref 8.9–10.3)
Chloride: 109 mmol/L (ref 98–111)
Creatinine: 0.75 mg/dL (ref 0.44–1.00)
GFR, Estimated: >60 mL/min (ref >60)
Glucose, Bld: 108 mg/dL — ABNORMAL HIGH (ref 70–99)
Potassium: 4 mmol/L (ref 3.5–5.1)
Sodium: 141 mmol/L (ref 135–145)
Total Bilirubin: 0.2 mg/dL — ABNORMAL LOW (ref 0.3–1.2)
Total Protein: 7.5 g/dL (ref 6.5–8.1)

## 2022-04-07 MED ORDER — DENOSUMAB 120 MG/1.7ML ~~LOC~~ SOLN
120.0000 mg | Freq: Once | SUBCUTANEOUS | Status: AC
  Administered 2022-04-07: 120 mg via SUBCUTANEOUS
  Filled 2022-04-07: qty 1.7

## 2022-04-07 NOTE — Assessment & Plan Note (Addendum)
Metastatic Breast Cancer: Current treatment: Anastrozole with Palbociclib started 05/11/20 Toxicities: Neutropenia: Currently on palbociclib 75 mg dose.   2 weeks on 2 weeks off. Weight gain: I discussed with her about documenting her food intake with the help of an application like my plate.   Scans:  1. 05/08/21: Unchanged appearance of the ST mass in left orbit, diffuse BM uptake 2. 05/16/21: CT CAP: Stable diffuse bone mets, dec in size of liver lesion, no residual measurable celiac axis LN 3. 05/16/21: Bone scan: Stable bone mets 4. 10/05/21: Stable diffuse bone mets, stable Right Axillary and Bilateral Ext iliac LN 5.  10/31/2021: MRI brain: No intracranial mets.  Similar residual infiltrative left orbital soft tissue, known bone metastasis 6.  01/09/2022: MRI breast: Benign, intact silicone implants 7.  12/09/4816: CT CAP: Unchanged bone metastases, unchanged right axillary and external iliac lymph nodes, unchanged metastatic lesion posterior liver   Labs and Xgeva and MD visit in 3 months and scans in 6 months. Continue with current treatment and dosage of Ibrance

## 2022-04-08 LAB — CANCER ANTIGEN 27.29: CA 27.29: 16.1 U/mL (ref 0.0–38.6)

## 2022-04-10 ENCOUNTER — Encounter: Payer: Self-pay | Admitting: Hematology and Oncology

## 2022-04-11 ENCOUNTER — Encounter: Payer: Self-pay | Admitting: Oncology

## 2022-04-11 ENCOUNTER — Other Ambulatory Visit (HOSPITAL_COMMUNITY): Payer: Self-pay

## 2022-04-15 ENCOUNTER — Encounter: Payer: Self-pay | Admitting: Hematology and Oncology

## 2022-04-15 ENCOUNTER — Encounter (INDEPENDENT_AMBULATORY_CARE_PROVIDER_SITE_OTHER): Payer: Self-pay

## 2022-04-15 ENCOUNTER — Inpatient Hospital Stay (HOSPITAL_BASED_OUTPATIENT_CLINIC_OR_DEPARTMENT_OTHER): Payer: BC Managed Care – PPO | Admitting: Hematology and Oncology

## 2022-04-15 DIAGNOSIS — C50919 Malignant neoplasm of unspecified site of unspecified female breast: Secondary | ICD-10-CM

## 2022-04-15 DIAGNOSIS — C771 Secondary and unspecified malignant neoplasm of intrathoracic lymph nodes: Secondary | ICD-10-CM

## 2022-04-15 DIAGNOSIS — C50911 Malignant neoplasm of unspecified site of right female breast: Secondary | ICD-10-CM | POA: Diagnosis not present

## 2022-04-15 DIAGNOSIS — C7951 Secondary malignant neoplasm of bone: Secondary | ICD-10-CM | POA: Diagnosis not present

## 2022-04-15 NOTE — Assessment & Plan Note (Signed)
Metastatic Breast Cancer: Current treatment: Anastrozole with Palbociclib started 05/11/20 Toxicities: Neutropenia: Currently on palbociclib 75 mg dose.   2 weeks on 2 weeks off. Weight gain: I discussed with her about documenting her food intake with the help of an application like my plate.   Scans:  1. 05/08/21: Unchanged appearance of the ST mass in left orbit, diffuse BM uptake 2. 05/16/21: CT CAP: Stable diffuse bone mets, dec in size of liver lesion, no residual measurable celiac axis LN 3. 05/16/21: Bone scan: Stable bone mets 4. 10/05/21: Stable diffuse bone mets, stable Right Axillary and Bilateral Ext iliac LN 5.  10/31/2021: MRI brain: No intracranial mets.  Similar residual infiltrative left orbital soft tissue, known bone metastasis 6.  01/09/2022: MRI breast: Benign, intact silicone implants 7.  03/83/3383: CT CAP: Unchanged bone metastases, unchanged right axillary and external iliac lymph nodes, unchanged metastatic lesion posterior liver   Labs and Xgeva and MD visit in 3 months and scans in 6 months. Continue with current treatment and dosage of Ibrance

## 2022-04-15 NOTE — Progress Notes (Signed)
HEMATOLOGY-ONCOLOGY War VISIT PROGRESS NOTE  I connected with KATHRYNNE KULINSKI on 04/15/2022 at  2:45 PM EST by Mychart video conference and verified that I am speaking with the correct person using two identifiers.  I discussed the limitations, risks, security and privacy concerns of performing an evaluation and management service by Webex and the availability of in person appointments.  I also discussed with the patient that there may be a patient responsible charge related to this service. The patient expressed understanding and agreed to proceed.  Patient's Location: Home Physician Location: Clinic  CHIEF COMPLIANT: Follow-up for intense headache  INTERVAL HISTORY: ELINORE SHULTS is a 46 y.o. 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  She called in complaining often done so sharp pains in the head that started anytime she exposed her scalp to heat for example with curling iron or dryers etc.  This started on Saturday and it was bothering her so decided to call and be seen virtually.  She does not have any skin changes.  Oncology History  Breast cancer metastasized to intrathoracic lymph node ([place])  01/02/2010 Initial Biopsy   bilateral mastectomies with left axillary lymph node sampling [6 nodes removed] 01/02/2010 for a pT2 pN0(i+), stage IIA invasive lobular breast cancer, grade 3, estrogen and progesterone receptor positive, HER-2 not amplified   2011 Oncotype testing   Oncotype DX score of 22 predicted a 14% risk of distant recurrence within 10 years if the patient's only systemic treatment is tamoxifen for 5 years   01/2010 - 06/2010 Chemotherapy   CMF(cyclophosphamide, fluorouracil, methotrexate) x7 given between October of 2011 and March of 2012 (7 of 8 planned treatments completed)   07/2010 - 10/2011 Anti-estrogen oral therapy   tamoxifen started April 2012, discontinued approximately July of 2013 (about 15 months)  because of problems with hot flashes and insomnia   05/11/2013 Initial Diagnosis   pathologically documented left axillary recurrence 05/05/2013, the tumor being again estrogen and progesterone receptor positive, with HER-2 not amplified, and MIB-1 of 15%. Staging CT/PET 05/27/2013 show left axillary and supraclavicular recurrence, no distant disease   06/16/2013 Surgery   completion left axillary lymph node dissection 06/16/2013 showing a total of 10/15 lymph nodes involved by tumor, with evidence of extracapsular extension (TX N3 = stage IIIC)   07/11/2013 - 09/12/2013 Chemotherapy   Carboplatin/ docetaxel, first dose on 07/11/2013, repeated every 21 days x 4 with Neulasta support on day 2.  Final dose was given on 09/12/2013, with chemotherapy discontinued after 4 cycles due to continuing low counts   10/2013 - 12/09/2013 Radiation Therapy   Left chest wall / 50.4 Gray @ 1.8 Pearline Cables per fraction x 28 fractions Left Supraclavicular fossa and PAB/ 45 Gray _0 .8 Gray per fraction x 25 fractions Left scar / 10 Gray at [employer] per fraction x 5 fractions       01/25/2014 Surgery   bilateral salpingo-oophorectomy on 01/25/14   12/2013 Miscellaneous   sequencing and deletion/duplication analysis of 17 genes performed September 2015 at [facility] showed no deleterious mutations in  ATM, BARD1, BRCA1, BRCA2, BRIP1, CDH1, CHEK2, MRE11A, MUTYH, NBN, NF1, PALB2, PTEN, RAD50, RAD51C, RAD51D, and TP53   02/27/2014 - 03/2020 Anti-estrogen oral therapy   tamoxifen re-started 02/27/2014, discontinued December 2021 with progression   04/23/2020 Relapse/Recurrence   bone marrow biopsy 04/23/2020 shows metastatic carcinoma, estrogen and progesterone receptor positive             (a) MRI of  the cervical spine 04/07/2020 shows markedly abnormal marrow signal, prominent right axillary lymph nodes, degenerative disc disease             (b) MRI of the orbits 04/13/2020 suggests metastatic disease around the left orbit and  diffusely abnormal bone marrow signal             (c) CT scan of the chest, abdomen and pelvis 05/10/2020 shows extensive adenopathy (right axillary, prevascular, right hilar, celiac and portacaval axes), multiple liver masses, multiple sclerotic bone lesions   04/2020 - 05/31/2020 Radiation Therapy   radiation to the left periorbital region completed 05/31/2020   05/01/2020 -  Anti-estrogen oral therapy   anastrozole started 05/01/2020 palbociclib started 05/11/2020 denosumab/ Xgeva started 07/05/2020     REVIEW OF SYSTEMS:   Constitutional: Denies fevers, chills or abnormal weight loss Pain and discomfort in the left side of the head especially in the scalp All other systems were reviewed with the patient and are negative.  Observations/Objective:  There were no vitals filed for this visit. There is no height or weight on file to calculate BMI.  I have reviewed the data as listed    Latest Ref Rng & Units 04/07/2022    8:04 AM 01/06/2022    8:17 AM 10/07/2021    8:22 AM  CMP  Glucose 70 - 99 mg/dL 108  121  108   BUN 6 - 20 mg/dL _0 Creatinine 0.44 - 1.00 mg/dL 0.75  0.75  1.02   Sodium 135 - 145 mmol/L 141  139  138   Potassium 3.5 - 5.1 mmol/L 4.0  3.9  4.1   Chloride 98 - 111 mmol/L 109  106  106   CO2 22 - 32 mmol/L _1 Calcium 8.9 - 10.3 mg/dL 10.1  10.4  10.2   Total Protein 6.5 - 8.1 g/dL 7.5  8.0  7.7   Total Bilirubin 0.3 - 1.2 mg/dL 0.2  0.3  0.3   Alkaline Phos 38 - 126 U/L 65  61  58   AST 15 - 41 U/L _2 ALT 0 - 44 U/L _3 Lab Results  Component Value Date   WBC 4.2 04/07/2022   HGB 11.5 (L) 04/07/2022   HCT 35.6 (L) 04/07/2022   MCV 89.7 04/07/2022   PLT 270 04/07/2022   NEUTROABS 1.5 (L) 04/07/2022      Assessment Plan:  Breast cancer metastasized to intrathoracic lymph node ([facility]) Metastatic Breast Cancer: Current treatment: Anastrozole with Palbociclib started 05/11/20 Toxicities: Neutropenia: Currently on  palbociclib 75 mg dose.   2 weeks on 2 weeks off. Weight gain: I discussed with her about documenting her food intake with the help of an application like my plate.   Scans:  1. 05/08/21: Unchanged appearance of the ST mass in left orbit, diffuse BM uptake 2. 05/16/21: CT CAP: Stable diffuse bone mets, dec in size of liver lesion, no residual measurable celiac axis LN 3. 05/16/21: Bone scan: Stable bone mets 4. 10/05/21: Stable diffuse bone mets, stable Right Axillary and Bilateral Ext iliac LN 5.  10/31/2021: MRI brain: No intracranial mets.  Similar residual infiltrative left orbital soft tissue, known bone metastasis 6.  01/09/2022: MRI breast: Benign, intact silicone implants 7.  75/64/3329: CT CAP: Unchanged bone metastases, unchanged right axillary and external iliac lymph nodes, unchanged metastatic lesion posterior liver  Pain in the scalp and left side of the head: Possibly radiation recall from effects of prior radiation.  I discussed with her that if the pain goes away by next Tuesday we do not need to do anything further but if it persists we will obtain a brain MRI for further assessment.  Labs and Delton See and MD visit in 3 months and scans in 6 months.  I discussed the assessment and treatment plan with the patient. The patient was provided an opportunity to ask questions and all were answered. The patient agreed with the plan and demonstrated an understanding of the instructions. The patient was advised to call back or seek an in-person evaluation if the symptoms worsen or if the condition fails to improve as anticipated.   I provided 12 minutes of face-to-face Web Ex time during this encounter.    Rulon Eisenmenger, MD 04/15/2022 I Gardiner Coins am acting as a Education administrator for [employer]  I have reviewed the above documentation for accuracy and completeness, and I agree with the above.

## 2022-04-17 ENCOUNTER — Encounter: Payer: Self-pay | Admitting: Hematology and Oncology

## 2022-04-18 ENCOUNTER — Encounter: Payer: Self-pay | Admitting: Hematology and Oncology

## 2022-04-20 ENCOUNTER — Other Ambulatory Visit: Payer: Self-pay | Admitting: Hematology and Oncology

## 2022-04-20 DIAGNOSIS — C50812 Malignant neoplasm of overlapping sites of left female breast: Secondary | ICD-10-CM

## 2022-04-21 ENCOUNTER — Encounter: Payer: Self-pay | Admitting: Adult Health

## 2022-04-22 ENCOUNTER — Other Ambulatory Visit: Payer: Self-pay

## 2022-04-22 ENCOUNTER — Telehealth: Payer: Self-pay

## 2022-04-22 DIAGNOSIS — C50919 Malignant neoplasm of unspecified site of unspecified female breast: Secondary | ICD-10-CM

## 2022-04-22 DIAGNOSIS — C7951 Secondary malignant neoplasm of bone: Secondary | ICD-10-CM

## 2022-04-22 DIAGNOSIS — C771 Secondary and unspecified malignant neoplasm of intrathoracic lymph nodes: Secondary | ICD-10-CM

## 2022-04-22 DIAGNOSIS — H0589 Other disorders of orbit: Secondary | ICD-10-CM

## 2022-04-22 DIAGNOSIS — C787 Secondary malignant neoplasm of liver and intrahepatic bile duct: Secondary | ICD-10-CM

## 2022-04-22 DIAGNOSIS — Z170 Estrogen receptor positive status [ER+]: Secondary | ICD-10-CM

## 2022-04-22 MED ORDER — PALBOCICLIB 75 MG PO TABS: 75.0000 mg | ORAL_TABLET | Freq: Every day | ORAL | 0 refills | Status: DC

## 2022-04-22 NOTE — Telephone Encounter (Signed)
Refilled today. Gardiner Rhyme, RN

## 2022-04-22 NOTE — Telephone Encounter (Signed)
Oral Oncology Pharmacist Encounter  Prescription refill for Ibrance sent to [facility] in error. Patient enrolled in manufacturer assistance and receives medication through [facility]. Prescription redirected to [facility] as patient does not have active insurance through [facility] until 04/28/22.  Drema Halon, PharmD Hematology/Oncology Clinical Pharmacist [facility] [phone] 04/22/2022 10:13 AM

## 2022-04-22 NOTE — Telephone Encounter (Signed)
S/w pt by phone in regards to MyChart message. Leslee Home has been refilled per pt.  Pt states h/a to left side has returned and she is concerned. She would like a repeat MRI to make sure there is not a recurrence post rxt. She is requesting medication for h/a as tylenol/ibuprofen alternating is not helping. Advised pt our providers are out of office at this time and we would address the h/a with NP 12/27. She denies severity of needing ER visit. Pt is aware if sx worsen she will need to go to ER for further eval. She will call to schedule brain MRI and knows we will need PA from insurance.

## 2022-04-22 NOTE — Progress Notes (Signed)
Orders entered for MR brain per NP

## 2022-04-23 ENCOUNTER — Ambulatory Visit
Admission: RE | Admit: 2022-04-23 | Discharge: 2022-04-23 | Disposition: A | Discharge status: Home / Self Care | Payer: BC Managed Care – PPO | Source: Ambulatory Visit | Attending: Adult Health | Admitting: Adult Health

## 2022-04-23 ENCOUNTER — Telehealth: Payer: Self-pay | Admitting: *Deleted

## 2022-04-23 DIAGNOSIS — C7951 Secondary malignant neoplasm of bone: Secondary | ICD-10-CM

## 2022-04-23 DIAGNOSIS — H0589 Other disorders of orbit: Secondary | ICD-10-CM

## 2022-04-23 DIAGNOSIS — R519 Headache, unspecified: Secondary | ICD-10-CM | POA: Diagnosis not present

## 2022-04-23 MED ORDER — GADOPICLENOL 0.5 MMOL/ML IV SOLN
10.0000 mL | Freq: Once | INTRAVENOUS | Status: AC | PRN
  Administered 2022-04-23: 10 mL via INTRAVENOUS

## 2022-04-23 NOTE — Telephone Encounter (Signed)
Received call from pt with complaint of continuous headaches.  Pt states OTC tylenol and ibuprofen are not controlling the pain, pt states in the past with hx of orbital mets, Tramadol helped to control the pain.  Pt states she has also called [facility] and is on a STAT wait list for Brain MRI. RN will review with NP for prescription for Tramadol be sent to pharmacy on file.

## 2022-04-24 ENCOUNTER — Other Ambulatory Visit: Payer: Self-pay | Admitting: Adult Health

## 2022-04-24 ENCOUNTER — Ambulatory Visit (HOSPITAL_COMMUNITY): Payer: BC Managed Care – PPO

## 2022-04-24 MED ORDER — TRAMADOL HCL 50 MG PO TABS: 50.0000 mg | ORAL_TABLET | Freq: Four times a day (QID) | ORAL | 0 refills | Status: DC | PRN

## 2022-04-24 NOTE — Progress Notes (Signed)
[name] is having recurrent headaches and MRI of the brain is pending these headaches are the same as when we previously saw her for her orbital metastases.  At that time tramadol worked for the pain she was experiencing.  Since Tylenol and Advil are not working it is reasonable for her to restart tramadol 50 mg every 6 hours as needed and this was sent to her pharmacy.  PMP aware was reviewed no red flags.  Wilber Bihari, NP 04/24/22 8:49 AM Medical Oncology and Hematology [facility] [place], [place] [address] Tel. [phone]    Fax. [phone]

## 2022-04-25 ENCOUNTER — Telehealth: Payer: Self-pay

## 2022-04-25 NOTE — Telephone Encounter (Signed)
Patient informed of recent MRI scan results. Patient appreciative of call and verbalized an understanding of the results.

## 2022-04-29 ENCOUNTER — Other Ambulatory Visit (HOSPITAL_COMMUNITY): Payer: Self-pay

## 2022-04-29 ENCOUNTER — Encounter: Payer: Self-pay | Admitting: Oncology

## 2022-05-14 ENCOUNTER — Other Ambulatory Visit: Payer: Self-pay | Admitting: Hematology and Oncology

## 2022-05-14 ENCOUNTER — Other Ambulatory Visit (HOSPITAL_COMMUNITY): Payer: Self-pay

## 2022-05-14 ENCOUNTER — Other Ambulatory Visit: Payer: Self-pay | Admitting: *Deleted

## 2022-05-14 DIAGNOSIS — C7951 Secondary malignant neoplasm of bone: Secondary | ICD-10-CM

## 2022-05-14 MED ORDER — PALBOCICLIB 75 MG PO TABS: ORAL_TABLET | ORAL | 1 refills | Status: DC

## 2022-05-15 ENCOUNTER — Other Ambulatory Visit: Payer: Self-pay | Admitting: Hematology and Oncology

## 2022-05-15 DIAGNOSIS — C7951 Secondary malignant neoplasm of bone: Secondary | ICD-10-CM

## 2022-05-20 ENCOUNTER — Encounter: Payer: Self-pay | Admitting: Hematology and Oncology

## 2022-05-20 ENCOUNTER — Encounter: Payer: Self-pay | Admitting: Oncology

## 2022-05-23 ENCOUNTER — Encounter: Payer: Self-pay | Admitting: Hematology and Oncology

## 2022-06-07 ENCOUNTER — Encounter: Payer: Self-pay | Admitting: Oncology

## 2022-06-18 ENCOUNTER — Other Ambulatory Visit: Payer: Self-pay | Admitting: Hematology and Oncology

## 2022-06-18 DIAGNOSIS — C7951 Secondary malignant neoplasm of bone: Secondary | ICD-10-CM

## 2022-06-27 ENCOUNTER — Other Ambulatory Visit: Payer: Self-pay | Admitting: *Deleted

## 2022-06-27 ENCOUNTER — Encounter: Payer: Self-pay | Admitting: Oncology

## 2022-06-27 ENCOUNTER — Encounter: Payer: Self-pay | Admitting: Hematology and Oncology

## 2022-06-27 DIAGNOSIS — C7951 Secondary malignant neoplasm of bone: Secondary | ICD-10-CM

## 2022-06-27 NOTE — Progress Notes (Signed)
Received message from pt requesting referral to phycology for depression related to breast cancer diagnosis.  Pt denies suicidal thoughts at this time.  Per MD, referral placed to Dr. Conception Chancy.  Pt notified to seek help at nearest ED if suicidal thoughts were to occur and pt verbalized understanding.

## 2022-07-07 ENCOUNTER — Inpatient Hospital Stay: Payer: No Typology Code available for payment source | Attending: Hematology and Oncology

## 2022-07-07 ENCOUNTER — Encounter: Payer: Self-pay | Admitting: Oncology

## 2022-07-07 ENCOUNTER — Inpatient Hospital Stay: Payer: No Typology Code available for payment source

## 2022-07-07 VITALS — BP 118/77 | HR 68 | Temp 98.8°F | Resp 18

## 2022-07-07 DIAGNOSIS — C50919 Malignant neoplasm of unspecified site of unspecified female breast: Secondary | ICD-10-CM

## 2022-07-07 DIAGNOSIS — C50911 Malignant neoplasm of unspecified site of right female breast: Secondary | ICD-10-CM | POA: Insufficient documentation

## 2022-07-07 DIAGNOSIS — C7951 Secondary malignant neoplasm of bone: Secondary | ICD-10-CM | POA: Diagnosis present

## 2022-07-07 DIAGNOSIS — C771 Secondary and unspecified malignant neoplasm of intrathoracic lymph nodes: Secondary | ICD-10-CM | POA: Insufficient documentation

## 2022-07-07 LAB — CBC WITH DIFFERENTIAL (CANCER CENTER ONLY)
Abs Immature Granulocytes: 0.02 10*3/uL (ref 0.00–0.07)
Basophils Absolute: 0 10*3/uL (ref 0.0–0.1)
Basophils Relative: 1 %
Eosinophils Absolute: 0.2 10*3/uL (ref 0.0–0.5)
Eosinophils Relative: 6 %
HCT: 35.1 % — ABNORMAL LOW (ref 36.0–46.0)
Hemoglobin: 11.3 g/dL — ABNORMAL LOW (ref 12.0–15.0)
Immature Granulocytes: 1 %
Lymphocytes Relative: 58 %
Lymphs Abs: 2 10*3/uL (ref 0.7–4.0)
MCH: 29 pg (ref 26.0–34.0)
MCHC: 32.2 g/dL (ref 30.0–36.0)
MCV: 90 fL (ref 80.0–100.0)
Monocytes Absolute: 0.1 10*3/uL (ref 0.1–1.0)
Monocytes Relative: 4 %
Neutro Abs: 1 10*3/uL — ABNORMAL LOW (ref 1.7–7.7)
Neutrophils Relative %: 30 %
Platelet Count: 309 10*3/uL (ref 150–400)
RBC: 3.9 MIL/uL (ref 3.87–5.11)
RDW: 14.9 % (ref 11.5–15.5)
WBC Count: 3.4 10*3/uL — ABNORMAL LOW (ref 4.0–10.5)
nRBC: 0 % (ref 0.0–0.2)

## 2022-07-07 LAB — CMP (CANCER CENTER ONLY)
ALT: 9 U/L (ref 0–44)
AST: 14 U/L — ABNORMAL LOW (ref 15–41)
Albumin: 4.3 g/dL (ref 3.5–5.0)
Alkaline Phosphatase: 50 U/L (ref 38–126)
Anion gap: 6 (ref 5–15)
BUN: 13 mg/dL (ref 6–20)
CO2: 27 mmol/L (ref 22–32)
Calcium: 9.8 mg/dL (ref 8.9–10.3)
Chloride: 108 mmol/L (ref 98–111)
Creatinine: 0.95 mg/dL (ref 0.44–1.00)
GFR, Estimated: >60 mL/min (ref >60)
Glucose, Bld: 104 mg/dL — ABNORMAL HIGH (ref 70–99)
Potassium: 3.9 mmol/L (ref 3.5–5.1)
Sodium: 141 mmol/L (ref 135–145)
Total Bilirubin: 0.4 mg/dL (ref 0.3–1.2)
Total Protein: 7.5 g/dL (ref 6.5–8.1)

## 2022-07-07 MED ORDER — DENOSUMAB 120 MG/1.7ML ~~LOC~~ SOLN
120.0000 mg | Freq: Once | SUBCUTANEOUS | Status: AC
  Administered 2022-07-07: 120 mg via SUBCUTANEOUS
  Filled 2022-07-07: qty 1.7

## 2022-07-08 LAB — CANCER ANTIGEN 27.29: CA 27.29: 14.8 U/mL (ref 0.0–38.6)

## 2022-07-16 ENCOUNTER — Encounter: Payer: Self-pay | Admitting: Hematology and Oncology

## 2022-07-21 ENCOUNTER — Other Ambulatory Visit: Payer: Self-pay

## 2022-07-21 ENCOUNTER — Encounter (HOSPITAL_BASED_OUTPATIENT_CLINIC_OR_DEPARTMENT_OTHER): Payer: Self-pay | Admitting: Orthopedic Surgery

## 2022-07-30 ENCOUNTER — Encounter (HOSPITAL_BASED_OUTPATIENT_CLINIC_OR_DEPARTMENT_OTHER): Payer: Self-pay | Admitting: Orthopedic Surgery

## 2022-07-30 ENCOUNTER — Ambulatory Visit (HOSPITAL_BASED_OUTPATIENT_CLINIC_OR_DEPARTMENT_OTHER): Payer: No Typology Code available for payment source | Admitting: Anesthesiology

## 2022-07-30 ENCOUNTER — Ambulatory Visit (HOSPITAL_BASED_OUTPATIENT_CLINIC_OR_DEPARTMENT_OTHER)
Admission: RE | Admit: 2022-07-30 | Discharge: 2022-07-30 | Disposition: A | Discharge status: Home / Self Care | Payer: No Typology Code available for payment source | Attending: Orthopedic Surgery | Admitting: Orthopedic Surgery

## 2022-07-30 ENCOUNTER — Encounter (HOSPITAL_BASED_OUTPATIENT_CLINIC_OR_DEPARTMENT_OTHER)
Admission: RE | Disposition: A | Discharge status: Home / Self Care | Payer: Self-pay | Source: Home / Self Care | Attending: Orthopedic Surgery

## 2022-07-30 ENCOUNTER — Other Ambulatory Visit: Payer: Self-pay

## 2022-07-30 DIAGNOSIS — Z853 Personal history of malignant neoplasm of breast: Secondary | ICD-10-CM | POA: Insufficient documentation

## 2022-07-30 DIAGNOSIS — Z08 Encounter for follow-up examination after completed treatment for malignant neoplasm: Secondary | ICD-10-CM | POA: Insufficient documentation

## 2022-07-30 DIAGNOSIS — M654 Radial styloid tenosynovitis [de Quervain]: Secondary | ICD-10-CM

## 2022-07-30 DIAGNOSIS — Z923 Personal history of irradiation: Secondary | ICD-10-CM | POA: Insufficient documentation

## 2022-07-30 DIAGNOSIS — Z01818 Encounter for other preprocedural examination: Secondary | ICD-10-CM

## 2022-07-30 DIAGNOSIS — Z9221 Personal history of antineoplastic chemotherapy: Secondary | ICD-10-CM | POA: Diagnosis not present

## 2022-07-30 DIAGNOSIS — F418 Other specified anxiety disorders: Secondary | ICD-10-CM

## 2022-07-30 HISTORY — DX: Anxiety disorder, unspecified: F41.9

## 2022-07-30 HISTORY — PX: DORSAL COMPARTMENT RELEASE: SHX5039

## 2022-07-30 LAB — POCT PREGNANCY, URINE: Preg Test, Ur: NEGATIVE

## 2022-07-30 SURGERY — RELEASE, FIRST DORSAL COMPARTMENT, HAND
Anesthesia: Monitor Anesthesia Care | Site: Wrist | Laterality: Right

## 2022-07-30 MED ORDER — FENTANYL CITRATE (PF) 100 MCG/2ML IJ SOLN: 25.0000 ug | INTRAMUSCULAR | Status: DC | PRN

## 2022-07-30 MED ORDER — ONDANSETRON HCL 4 MG/2ML IJ SOLN
INTRAMUSCULAR | Status: DC | PRN
  Administered 2022-07-30: 4 mg via INTRAVENOUS

## 2022-07-30 MED ORDER — MIDAZOLAM HCL 2 MG/2ML IJ SOLN
INTRAMUSCULAR | Status: AC
  Filled 2022-07-30: qty 2

## 2022-07-30 MED ORDER — LACTATED RINGERS IV SOLN: INTRAVENOUS | Status: DC

## 2022-07-30 MED ORDER — CEFAZOLIN SODIUM-DEXTROSE 2-4 GM/100ML-% IV SOLN
INTRAVENOUS | Status: AC
  Filled 2022-07-30: qty 100

## 2022-07-30 MED ORDER — PROPOFOL 500 MG/50ML IV EMUL
INTRAVENOUS | Status: AC
  Filled 2022-07-30: qty 50

## 2022-07-30 MED ORDER — OXYCODONE HCL 5 MG PO TABS: 5.0000 mg | ORAL_TABLET | Freq: Four times a day (QID) | ORAL | 0 refills | Status: AC | PRN

## 2022-07-30 MED ORDER — PROMETHAZINE HCL 25 MG/ML IJ SOLN: 6.2500 mg | INTRAMUSCULAR | Status: DC | PRN

## 2022-07-30 MED ORDER — LIDOCAINE HCL 1 % IJ SOLN
INTRAMUSCULAR | Status: DC | PRN
  Administered 2022-07-30: 5 mL

## 2022-07-30 MED ORDER — PROPOFOL 10 MG/ML IV BOLUS
INTRAVENOUS | Status: AC
  Filled 2022-07-30: qty 20

## 2022-07-30 MED ORDER — PROPOFOL 10 MG/ML IV BOLUS
INTRAVENOUS | Status: DC | PRN
  Administered 2022-07-30: 10 mg via INTRAVENOUS

## 2022-07-30 MED ORDER — ACETAMINOPHEN 500 MG PO TABS
1000.0000 mg | ORAL_TABLET | Freq: Once | ORAL | Status: AC
  Administered 2022-07-30: 1000 mg via ORAL

## 2022-07-30 MED ORDER — ONDANSETRON HCL 4 MG/2ML IJ SOLN
INTRAMUSCULAR | Status: AC
  Filled 2022-07-30: qty 2

## 2022-07-30 MED ORDER — MIDAZOLAM HCL 5 MG/5ML IJ SOLN
INTRAMUSCULAR | Status: DC | PRN
  Administered 2022-07-30: 2 mg via INTRAVENOUS

## 2022-07-30 MED ORDER — BUPIVACAINE HCL (PF) 0.25 % IJ SOLN
INTRAMUSCULAR | Status: DC | PRN
  Administered 2022-07-30: 5 mL

## 2022-07-30 MED ORDER — LIDOCAINE 2% (20 MG/ML) 5 ML SYRINGE
INTRAMUSCULAR | Status: AC
  Filled 2022-07-30: qty 5

## 2022-07-30 MED ORDER — PROPOFOL 500 MG/50ML IV EMUL
INTRAVENOUS | Status: DC | PRN
  Administered 2022-07-30: 125 ug/kg/min via INTRAVENOUS

## 2022-07-30 MED ORDER — LIDOCAINE HCL (PF) 1 % IJ SOLN
INTRAMUSCULAR | Status: AC
  Filled 2022-07-30: qty 30

## 2022-07-30 MED ORDER — ACETAMINOPHEN 500 MG PO TABS
ORAL_TABLET | ORAL | Status: AC
  Filled 2022-07-30: qty 2

## 2022-07-30 MED ORDER — FENTANYL CITRATE (PF) 100 MCG/2ML IJ SOLN
INTRAMUSCULAR | Status: DC | PRN
  Administered 2022-07-30 (×2): 50 ug via INTRAVENOUS

## 2022-07-30 MED ORDER — FENTANYL CITRATE (PF) 100 MCG/2ML IJ SOLN
INTRAMUSCULAR | Status: AC
  Filled 2022-07-30: qty 2

## 2022-07-30 MED ORDER — CEFAZOLIN SODIUM-DEXTROSE 2-4 GM/100ML-% IV SOLN
2.0000 g | INTRAVENOUS | Status: AC
  Administered 2022-07-30: 2 g via INTRAVENOUS

## 2022-07-30 MED ORDER — BUPIVACAINE HCL (PF) 0.25 % IJ SOLN
INTRAMUSCULAR | Status: AC
  Filled 2022-07-30: qty 30

## 2022-07-30 SURGICAL SUPPLY — 41 items
APL PRP STRL LF DISP 70% ISPRP (MISCELLANEOUS) ×1
BLADE SURG 15 STRL LF DISP TIS (BLADE) ×1 IMPLANT
BLADE SURG 15 STRL SS (BLADE) ×1
BNDG CMPR 5X3 KNIT ELC UNQ LF (GAUZE/BANDAGES/DRESSINGS)
BNDG CMPR 9X4 STRL LF SNTH (GAUZE/BANDAGES/DRESSINGS)
BNDG COHESIVE 1X5 TAN STRL LF (GAUZE/BANDAGES/DRESSINGS) IMPLANT
BNDG ELASTIC 3INX 5YD STR LF (GAUZE/BANDAGES/DRESSINGS) IMPLANT
BNDG ESMARK 4X9 LF (GAUZE/BANDAGES/DRESSINGS) IMPLANT
BNDG GAUZE DERMACEA FLUFF 4 (GAUZE/BANDAGES/DRESSINGS) IMPLANT
BNDG GZE DERMACEA 4 6PLY (GAUZE/BANDAGES/DRESSINGS)
CHLORAPREP W/TINT 26 (MISCELLANEOUS) ×1 IMPLANT
CORD BIPOLAR FORCEPS 12FT (ELECTRODE) ×1 IMPLANT
COVER BACK TABLE 60X90IN (DRAPES) ×1 IMPLANT
COVER MAYO STAND STRL (DRAPES) ×1 IMPLANT
CUFF TOURN SGL QUICK 18X4 (TOURNIQUET CUFF) ×1 IMPLANT
CUFF TOURN SGL QUICK 24 (TOURNIQUET CUFF)
CUFF TRNQT CYL 24X4X16.5-23 (TOURNIQUET CUFF) IMPLANT
DRAPE EXTREMITY T 121X128X90 (DISPOSABLE) ×1 IMPLANT
DRAPE SURG 17X23 STRL (DRAPES) ×1 IMPLANT
GAUZE SPONGE 4X4 12PLY STRL (GAUZE/BANDAGES/DRESSINGS) IMPLANT
GAUZE XEROFORM 1X8 LF (GAUZE/BANDAGES/DRESSINGS) ×1 IMPLANT
GLOVE BIO SURGEON STRL SZ7 (GLOVE) ×1 IMPLANT
GLOVE BIOGEL PI IND STRL 7.0 (GLOVE) ×1 IMPLANT
GOWN STRL REUS W/ TWL LRG LVL3 (GOWN DISPOSABLE) ×1 IMPLANT
GOWN STRL REUS W/ TWL XL LVL3 (GOWN DISPOSABLE) ×1 IMPLANT
GOWN STRL REUS W/TWL LRG LVL3 (GOWN DISPOSABLE) ×1
GOWN STRL REUS W/TWL XL LVL3 (GOWN DISPOSABLE) ×1
NDL HYPO 25X1 1.5 SAFETY (NEEDLE) IMPLANT
NEEDLE HYPO 25X1 1.5 SAFETY (NEEDLE) IMPLANT
NS IRRIG 1000ML POUR BTL (IV SOLUTION) ×1 IMPLANT
PACK BASIN DAY SURGERY FS (CUSTOM PROCEDURE TRAY) ×1 IMPLANT
PAD CAST 3X4 CTTN HI CHSV (CAST SUPPLIES) IMPLANT
PADDING CAST COTTON 3X4 STRL (CAST SUPPLIES)
SLEEVE SCD COMPRESS KNEE MED (STOCKING) IMPLANT
SUT ETHILON 4 0 PS 2 18 (SUTURE) ×1 IMPLANT
SUT MNCRL AB 3-0 PS2 18 (SUTURE) IMPLANT
SUT VICRYL 4-0 PS2 18IN ABS (SUTURE) IMPLANT
SYR BULB EAR ULCER 3OZ GRN STR (SYRINGE) ×1 IMPLANT
SYR CONTROL 10ML LL (SYRINGE) IMPLANT
TOWEL GREEN STERILE FF (TOWEL DISPOSABLE) ×2 IMPLANT
UNDERPAD 30X36 HEAVY ABSORB (UNDERPADS AND DIAPERS) ×1 IMPLANT

## 2022-07-30 NOTE — Anesthesia Preprocedure Evaluation (Addendum)
Anesthesia Evaluation  Patient identified by MRN, date of birth, ID band Patient awake    Reviewed: Allergy & Precautions, NPO status , Patient's Chart, lab work & pertinent test results  History of Anesthesia Complications Negative for: history of anesthetic complications  Airway Mallampati: III  TM Distance: >3 FB Neck ROM: Full    Dental  (+) Teeth Intact, Dental Advisory Given   Pulmonary neg pulmonary ROS   Pulmonary exam normal breath sounds clear to auscultation       Cardiovascular Exercise Tolerance: Good negative cardio ROS Normal cardiovascular exam Rhythm:Regular Rate:Normal     Neuro/Psych  PSYCHIATRIC DISORDERS Anxiety Depression    negative neurological ROS     GI/Hepatic Neg liver ROS,GERD  ,,  Endo/Other  negative endocrine ROS    Renal/GU negative Renal ROS     Musculoskeletal Right De quervain's tenosynovitis   Abdominal   Peds  Hematology negative hematology ROS (+)   Anesthesia Other Findings Day of surgery medications reviewed with the patient.  H/o breast cancer s/p chemo, radiation  Reproductive/Obstetrics                             Anesthesia Physical Anesthesia Plan  ASA: 2  Anesthesia Plan: MAC   Post-op Pain Management: Tylenol PO (pre-op)*   Induction: Intravenous  PONV Risk Score and Plan: 2 and TIVA, Treatment may vary due to age or medical condition, Dexamethasone, Ondansetron and Midazolam  Airway Management Planned: Natural Airway and Simple Face Mask  Additional Equipment:   Intra-op Plan:   Post-operative Plan:   Informed Consent: I have reviewed the patients History and Physical, chart, labs and discussed the procedure including the risks, benefits and alternatives for the proposed anesthesia with the patient or authorized representative who has indicated his/her understanding and acceptance.     Dental advisory given  Plan  Discussed with: CRNA  Anesthesia Plan Comments:        Anesthesia Quick Evaluation

## 2022-07-30 NOTE — Transfer of Care (Signed)
Immediate Anesthesia Transfer of Care Note  Patient: [name]  Procedure(s) Performed: RELEASE FIRST  DORSAL COMPARTMENT (DEQUERVAIN) (Right: Wrist)  Patient Location: PACU  Anesthesia Type:MAC  Level of Consciousness: awake, alert , and oriented  Airway & Oxygen Therapy: Patient Spontanous Breathing and Patient connected to face mask oxygen  Post-op Assessment: Report given to RN and Post -op Vital signs reviewed and stable  Post vital signs: Reviewed and stable  Last Vitals:  Vitals Value Taken Time  BP 123/66 07/30/22 1034  Temp 36.3 C 07/30/22 1034  Pulse 69 07/30/22 1036  Resp 13 07/30/22 1036  SpO2 100 % 07/30/22 1036  Vitals shown include unvalidated device data.  Last Pain:  Vitals:   07/30/22 1034  TempSrc:   PainSc: Asleep      Patients Stated Pain Goal: 5 ([id number])  Complications: No notable events documented.

## 2022-07-30 NOTE — Op Note (Signed)
   Date of Surgery: 07/30/2022  INDICATIONS: Patient is a 47 y.o.-year-old female with right De Quervain's tenosynovitis that has failed conservative management with bracing, activity modification, and corticosteroid injection.  Risks, benefits, and alternatives to surgery were again discussed with the patient in the preoperative area. The patient wishes to proceed with surgery.  Informed consent was signed after our discussion.   PREOPERATIVE DIAGNOSIS:  Right De Quervain's tenosynovitis  POSTOPERATIVE DIAGNOSIS: Same.  PROCEDURE:  Right first dorsal compartment release   SURGEON: Audria Nine, M.D.  ASSIST:   ANESTHESIA:  Local + MAC  IV FLUIDS AND URINE: See anesthesia.  ESTIMATED BLOOD LOSS: <5 mL.  IMPLANTS: * No implants in log *   DRAINS: None  COMPLICATIONS: None  DESCRIPTION OF PROCEDURE: The patient was met in the preoperative holding area where the surgical site was marked and the informed consent form was signed.  The patient was then brought back to the operating room and remained on the stretcher.  A hand table was placed adjacent to the operative extremity and locked into place.  A tourniquet was placed on the right forearm.  A formal timeout was performed to confirm that this was the correct patient, surgical side, surgical site, and surgical procedure.  All were present and in agreement. Following formal timeout, a local block was performed using 10 mL of equal mixture of 1% plain lidocaine and 0.25% plain marcaine.  The right upper extremity was then prepped and draped in the usual and sterile fashion.    Following second formal timeout, the limb was gently exsanguinated with an Esmarch bandage and the tourniquet inflated to 250 mmHg.  A transverse incision was made 1 to 2 cm proximal to the tip of the radial styloid. Blunt dissection was used to identify the branch of the superficial radial nerve in the dorsal soft tissues.  The retinaculum overlying the first  dorsal compartment was identified.  The dorsal and volar soft tissues were protected using Senn retractors.  The first dorsal compartment was entered at the dorsal aspect using a 15 blade scalpel.  The compartment was released at its dorsal aspect using a tenotomy scissors scissor both proximally and distally.  The EPB tendon and APL tendons were identified.  There was no separate subsheath within the compartment.  Inspection demonstrated that the compartment was completely released.  Following compartment release, the wound was thoroughly irrigated with copious sterile saline.  Tourniquet was let down.  Hemostasis was achieved with direct pressure of the wound.  The wound was then closed using a 4-0 nylon suture in horizontal mattress fashion.  The wound was dressed with Xeroform, folded 4 x 4's, and an Ace wrap.  The patient was reversed from sedation.  They were transferred from the operating table to the postoperative bed.  All counts were correct x 2 at the end of the procedure.  The patient was then taken to the PACU in stable condition.   POSTOPERATIVE PLAN: Patient will be discharged home with appropriate pain medication and discharge instructions.  I will see her back in the office in 10 to 14 days for her first postop visit.  Audria Nine, MD 10:39 AM

## 2022-07-30 NOTE — Anesthesia Postprocedure Evaluation (Signed)
Anesthesia Post Note  Patient: [name]  Procedure(s) Performed: RELEASE FIRST  DORSAL COMPARTMENT (DEQUERVAIN) (Right: Wrist)     Patient location during evaluation: PACU Anesthesia Type: MAC Level of consciousness: awake and alert Pain management: pain level controlled Vital Signs Assessment: post-procedure vital signs reviewed and stable Respiratory status: spontaneous breathing, nonlabored ventilation and respiratory function stable Cardiovascular status: stable and blood pressure returned to baseline Postop Assessment: no apparent nausea or vomiting Anesthetic complications: no   No notable events documented.  Last Vitals:  Vitals:   07/30/22 1100 07/30/22 1109  BP: (!) 144/99 111/75  Pulse: 65 69  Resp: 16 18  Temp:  (!) 36.2 C  SpO2: 95% 97%    Last Pain:  Vitals:   07/30/22 1109  TempSrc: Oral  PainSc: 0-No pain                 Santa Lighter

## 2022-07-30 NOTE — Discharge Instructions (Addendum)
[name], M.D. Hand Surgery  POST-OPERATIVE DISCHARGE INSTRUCTIONS   PRESCRIPTIONS: - You may have been given a prescription to be taken as directed for post-operative pain control.  You may also take over the counter ibuprofen/aleve and tylenol for pain. Take this as directed on the packaging. Do not exceed 3000 mg tylenol/acetaminophen in 24 hours.  Ibuprofen 600-800 mg (3-4) tablets by mouth every 6 hours as needed for pain.   OR  Aleve 2 tablets by mouth every 12 hours (twice daily) as needed for pain.   AND/OR  Tylenol 1000 mg (2 tablets) every 8 hours as needed for pain.  - Please use your pain medication carefully, as refills are limited and you may not be provided with one.  As stated above, please use over the counter pain medicine - it will also be helpful with decreasing your swelling.    ANESTHESIA: -After your surgery, post-surgical discomfort or pain is likely. This discomfort can last several days to a few weeks. At certain times of the day your discomfort may be more intense.   Did you receive a nerve block?   - A nerve block can provide pain relief for one hour to two days after your surgery. As long as the nerve block is working, you will experience little or no sensation in the area the surgeon operated on.  - As the nerve block wears off, you will begin to experience pain or discomfort. It is very important that you begin taking your prescribed pain medication before the nerve block fully wears off. Treating your pain at the first sign of the block wearing off will ensure your pain is better controlled and more tolerable when full-sensation returns. Do not wait until the pain is intolerable, as the medicine will be less effective. It is better to treat pain in advance than to try and catch up.   General Anesthesia:  If you did not receive a nerve block during your surgery, you will need to start taking your pain medication shortly after your surgery and  should continue to do so as prescribed by your surgeon.     ICE AND ELEVATION: - You may use ice for the first 48-72 hours, but it is not critical.   - Motion of your fingers is very important to decrease the swelling.  - Elevation, as much as possible for the next 48 hours, is critical for decreasing swelling as well as for pain relief. Elevation means when you are seated or lying down, you hand should be at or above your heart. When walking, the hand needs to be at or above the level of your elbow.  - If the bandage gets too tight, it may need to be loosened. Please contact our office and we will instruct you in how to do this.    SURGICAL BANDAGES:  - Keep your dressing and/or splint clean and dry at all times.  You can remove your dressing 7 days from now and change with a dry dressing or Band-Aids as needed thereafter. - You may place a plastic bag over your bandage to shower, but be careful, do not get your bandages wet.  - After the bandages have been removed, it is OK to get the stitches wet in a shower or with hand washing. Do Not soak or submerge the wound yet. Please do not use lotions or creams on the stitches.      HAND THERAPY:  - You may not need any. If you  do, we will begin this at your follow up visit in the clinic.    ACTIVITY AND WORK: - You are encouraged to move any fingers which are not in the bandage.  - Light use of the fingers is allowed to assist the other hand with daily hygiene and eating, but strong gripping or lifting is often uncomfortable and should be avoided.  - You might miss a variable period of time from work and hopefully this issue has been discussed prior to surgery. You may not do any heavy work with your affected hand for about 2 weeks.    [facility] [address] Floor, [place], [place] [address] [phone]    No tylenol until 2pm  Post Anesthesia Home Care Instructions  Activity: Get plenty of rest for the  remainder of the day. A responsible individual must stay with you for 24 hours following the procedure.  For the next 24 hours, DO NOT: -Drive a car -Paediatric nurse -Drink alcoholic beverages -Take any medication unless instructed by your physician -Make any legal decisions or sign important papers.  Meals: Start with liquid foods such as gelatin or soup. Progress to regular foods as tolerated. Avoid greasy, spicy, heavy foods. If nausea and/or vomiting occur, drink only clear liquids until the nausea and/or vomiting subsides. Call your physician if vomiting continues.  Special Instructions/Symptoms: Your throat may feel dry or sore from the anesthesia or the breathing tube placed in your throat during surgery. If this causes discomfort, gargle with warm salt water. The discomfort should disappear within 24 hours.  If you had a scopolamine patch placed behind your ear for the management of post- operative nausea and/or vomiting:  1. The medication in the patch is effective for 72 hours, after which it should be removed.  Wrap patch in a tissue and discard in the trash. Wash hands thoroughly with soap and water. 2. You may remove the patch earlier than 72 hours if you experience unpleasant side effects which may include dry mouth, dizziness or visual disturbances. 3. Avoid touching the patch. Wash your hands with soap and water after contact with the patch.

## 2022-07-30 NOTE — H&P (Signed)
HAND SURGERY   HPI: Patient is a 47 y.o. female who presents with right radial sided wrist pain over the first dorsal compartment consistent with de quervain's tenosynovitis.  She has failed conservative management with bracing, activity modification, and multiple corticosteroid injections.  Patient denies any changes to their medical history or new systemic symptoms today.    Past Medical History:  Diagnosis Date   Anxiety    Back muscle spasm    occasional tx with skelaxin   Cancer    Breast   GERD (gastroesophageal reflux disease)    Radiation 10/24/13-12/09/13   Left chestwall/supraclav./scar   Past Surgical History:  Procedure Laterality Date   AXILLARY LYMPH NODE DISSECTION Left 06/16/2013   Procedure: LEFT AXILLARY NODE DISSECTION;  Surgeon: Shann Medal, MD;  Location: [facility];  Service: General;  Laterality: Left;   BILATERAL TOTAL MASTECTOMY WITH AXILLARY LYMPH NODE DISSECTION     BREAST IMPLANT REMOVAL Bilateral 03/18/2018   Procedure: REMOVAL BREAST IMPLANTS;  Surgeon: Wallace Going, DO;  Location: [facility];  Service: Plastics;  Laterality: Bilateral;   BREAST SURGERY     CAPSULECTOMY Bilateral 03/18/2018   Procedure: CAPSULECTOMY;  Surgeon: Wallace Going, DO;  Location: [facility];  Service: Plastics;  Laterality: Bilateral;   LAPAROSCOPIC BILATERAL SALPINGO OOPHERECTOMY Bilateral 01/25/2014   Procedure: LAPAROSCOPIC BILATERAL SALPINGO OOPHORECTOMY;  Surgeon: Eldred Manges, MD;  Location: [facility];  Service: Gynecology;  Laterality: Bilateral;   LEEP     PLACEMENT OF BREAST IMPLANTS Bilateral 03/18/2018   Procedure: PLACEMENT OF BREAST IMPLANTS;  Surgeon: Wallace Going, DO;  Location: [facility];  Service: Plastics;  Laterality: Bilateral;   PORT-A-CATH REMOVAL N/A 01/25/2014   Procedure: REMOVAL PORT-A-CATH;  Surgeon: Alphonsa Overall, MD;  Location: [facility];  Service: General;  Laterality: N/A;    PORTACATH PLACEMENT N/A 06/16/2013   Procedure: POWER PORT PLACEMENT;  Surgeon: Shann Medal, MD;  Location: [facility];  Service: General;  Laterality: N/A;   right leg surgery     broken femur - has rod in leg   TONSILLECTOMY     WISDOM TOOTH EXTRACTION     Social History   Socioeconomic History   Marital status: Married    Spouse name: Not on file   Number of children: 0   Years of education: Not on file   Highest education level: Not on file  Occupational History    Employer: [employer]  Tobacco Use   Smoking status: Never   Smokeless tobacco: Never  Vaping Use   Vaping Use: Never used  Substance and Sexual Activity   Alcohol use: Yes    Comment: socially wine   Drug use: No   Sexual activity: Yes    Birth control/protection: None  Other Topics Concern   Not on file  Social History Narrative   Not on file   Social Determinants of Health   Financial Resource Strain: Not on file  Food Insecurity: Not on file  Transportation Needs: Not on file  Physical Activity: Not on file  Stress: Not on file  Social Connections: Not on file   Family History  Problem Relation Age of Onset   Other Mother 64       glioblastoma; deceased 13   Cancer Mother    Other Father 14       glioblastoma; deceased 4   Cancer Father    Hypertension Other    - negative except otherwise stated in  the family history section Allergies  Allergen Reactions   Sulfa Antibiotics Hives and Itching   Gadolinium Derivatives Hives    After gado injection, pt had a few small hives on left breast area. Treated with benadryl by dr Janeece Fitting  Patient takes benadryl  at home prior to scan    Petrolatum Hives and Rash   Prior to Admission medications   Medication Sig Start Date End Date Taking? Authorizing Provider  IBRANCE 75 MG tablet TAKE 1 TABLET BY MOUTH DAILY FOR 14 DAYS ON, THEN 14 DAYS OFF. REPEAT EVERY 28 DAYS. Patient taking differently: No sig reported 06/18/22  Yes Nicholas Lose, MD   anastrozole (ARIMIDEX) 1 MG tablet Take 1 tablet (1 mg total) by mouth daily. 12/27/21   Nicholas Lose, MD   No results found. - Positive ROS: All other systems have been reviewed and were otherwise negative with the exception of those mentioned in the HPI and as above.  Physical Exam: General: No acute distress, resting comfortably Cardiovascular: BUE warm and well perfused, normal rate Respiratory: Normal WOB on RA Skin: Warm and dry Neurologic: Sensation intact distally Psychiatric: Patient is at baseline mood and affect  Right Upper Extremity  TTP directly over radial styloid with moderate swelling.  Positive Finkelstein test.  Negative CMC grind test.  No palpable or visible triggering of thumb.  Full and painless AROM of fingers.  SILT m/u/r distribution.  Hand is warm and well perfused w/ BCR.     Assessment: 47 yo F w/ right de quervains tenosynovitis that has failed conservative management.   Plan: OR today for right first dorsal compartment release. We again reviewed the risks of surgery which include bleeding, infection, damage to neurovascular structures including branches of the superficial radial nerve, persistent symptoms, delayed wound healing, sensitivity around incision, need for additional surgery.  Informed consent was signed.  All questions were answered.   Sherilyn Cooter, M.D. [facility] 9:49 AM

## 2022-07-30 NOTE — Progress Notes (Signed)
Pt. LUE is restricted. Surgery on right. Will await surgeon for instructions on IV

## 2022-07-30 NOTE — Interval H&P Note (Signed)
History and Physical Interval Note:  07/30/2022 9:52 AM  [name]  has presented today for surgery, with the diagnosis of Right De quervain's tenosynovitis.  The various methods of treatment have been discussed with the patient and family. After consideration of risks, benefits and other options for treatment, the patient has consented to  Procedure(s) with comments: [place] (DEQUERVAIN) (Right) - local as a surgical intervention.  The patient's history has been reviewed, patient examined, no change in status, stable for surgery.  I have reviewed the patient's chart and labs.  Questions were answered to the patient's satisfaction.     Marnette Perkins Danasia Baker

## 2022-07-31 ENCOUNTER — Encounter (HOSPITAL_BASED_OUTPATIENT_CLINIC_OR_DEPARTMENT_OTHER): Payer: Self-pay | Admitting: Orthopedic Surgery

## 2022-08-04 ENCOUNTER — Encounter: Payer: Self-pay | Admitting: Hematology and Oncology

## 2022-08-15 ENCOUNTER — Other Ambulatory Visit: Payer: Self-pay | Admitting: Hematology and Oncology

## 2022-08-15 DIAGNOSIS — C7951 Secondary malignant neoplasm of bone: Secondary | ICD-10-CM

## 2022-09-07 ENCOUNTER — Encounter: Payer: Self-pay | Admitting: Oncology

## 2022-09-29 ENCOUNTER — Encounter: Payer: Self-pay | Admitting: Hematology and Oncology

## 2022-10-06 ENCOUNTER — Other Ambulatory Visit: Payer: Self-pay | Admitting: *Deleted

## 2022-10-06 ENCOUNTER — Telehealth: Payer: Self-pay | Admitting: Hematology and Oncology

## 2022-10-06 DIAGNOSIS — C50919 Malignant neoplasm of unspecified site of unspecified female breast: Secondary | ICD-10-CM

## 2022-10-06 NOTE — Telephone Encounter (Signed)
Rescheduled appointments per provider PAL. Left voicemail.  

## 2022-10-06 NOTE — Progress Notes (Deleted)
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)  01/02/2010 Initial Biopsy   bilateral mastectomies with left axillary lymph node sampling [6 nodes removed] 01/02/2010 for a pT2 pN0(i+), stage IIA invasive lobular breast cancer, grade 3, estrogen and progesterone receptor positive, HER-2 not amplified   2011 Oncotype testing   Oncotype DX score of 22 predicted a 14% risk of distant recurrence within 10 years if the patient's only systemic treatment is tamoxifen for 5 years   01/2010 - 06/2010 Chemotherapy   CMF(cyclophosphamide, fluorouracil, methotrexate) x7 given between October of 2011 and March of 2012 (7 of 8 planned treatments completed)   07/2010 - 10/2011 Anti-estrogen oral therapy   tamoxifen started April 2012, discontinued approximately July of 2013 (about 15 months) because of problems with hot flashes and insomnia   05/11/2013 Initial Diagnosis   pathologically documented left axillary recurrence 05/05/2013, the tumor being again estrogen and progesterone receptor positive, with HER-2 not amplified, and MIB-1 of 15%. Staging CT/PET 05/27/2013 show left axillary and supraclavicular recurrence, no distant disease   06/16/2013 Surgery   completion left axillary lymph node dissection 06/16/2013 showing a total of 10/15 lymph nodes involved by tumor, with evidence of extracapsular extension (TX N3 = stage IIIC)   07/11/2013 - 09/12/2013 Chemotherapy   Carboplatin/ docetaxel, first dose on 07/11/2013, repeated  every 21 days x 4 with Neulasta support on day 2.  Final dose was given on 09/12/2013, with chemotherapy discontinued after 4 cycles due to continuing low counts   10/2013 - 12/09/2013 Radiation Therapy   Left chest wall / 50.4 Gray @ 1.8 Wallace Cullens per fraction x 28 fractions Left Supraclavicular fossa and PAB/ 45 Gray @1 .8 Gray per fraction x 25 fractions Left scar / 10 Gray at [employer] per fraction x 5 fractions       01/25/2014 Surgery   bilateral salpingo-oophorectomy on 01/25/14   12/2013 Miscellaneous   sequencing and deletion/duplication analysis of 17 genes performed September 2015 at [facility] showed no deleterious mutations in  ATM, BARD1, BRCA1, BRCA2, BRIP1, CDH1, CHEK2, MRE11A, MUTYH, NBN, NF1, PALB2, PTEN, RAD50, RAD51C, RAD51D, and TP53   02/27/2014 - 03/2020 Anti-estrogen oral therapy   tamoxifen re-started 02/27/2014, discontinued December 2021 with progression   04/23/2020 Relapse/Recurrence   bone marrow biopsy 04/23/2020 shows metastatic carcinoma, estrogen and progesterone receptor positive             (a) MRI of the cervical spine 04/07/2020 shows markedly abnormal marrow signal, prominent right axillary lymph nodes, degenerative disc disease             (b) MRI of the orbits 04/13/2020 suggests metastatic disease around the left orbit and diffusely abnormal bone marrow signal             (c) CT scan of the chest, abdomen and pelvis 05/10/2020 shows extensive adenopathy (right axillary, prevascular, right hilar, celiac and portacaval axes), multiple liver masses, multiple sclerotic bone lesions   04/2020 - 05/31/2020 Radiation Therapy   radiation to the left periorbital region completed 05/31/2020   05/01/2020 -  Anti-estrogen oral therapy   anastrozole started 05/01/2020 palbociclib  started 05/11/2020 denosumab/ Xgeva started 07/05/2020      CURRENT THERAPY:  Anastrozole daily with palbociclib 75 mg daily 2 weeks on/2 weeks off started 05/11/2020 and q3 month  Xgeva  INTERVAL HISTORY [name] returns for follow-up and Xgeva injection, last seen virtually by Dr. Pamelia Hoit 04/15/2022. Last Xgeva 07/07/22. Surgery for de quervain's tenosynovitis 07/30/22.   ROS   Past Medical History:  Diagnosis Date   Anxiety    Back muscle spasm    occasional tx with skelaxin   Cancer (HCC)    Breast   GERD (gastroesophageal reflux disease)    Radiation 10/24/13-12/09/13   Left chestwall/supraclav./scar     Past Surgical History:  Procedure Laterality Date   AXILLARY LYMPH NODE DISSECTION Left 06/16/2013   Procedure: LEFT AXILLARY NODE DISSECTION;  Surgeon: Kandis Cocking, MD;  Location: [facility];  Service: General;  Laterality: Left;   BILATERAL TOTAL MASTECTOMY WITH AXILLARY LYMPH NODE DISSECTION     BREAST IMPLANT REMOVAL Bilateral 03/18/2018   Procedure: REMOVAL BREAST IMPLANTS;  Surgeon: Peggye Form, DO;  Location: [facility];  Service: Plastics;  Laterality: Bilateral;   BREAST SURGERY     CAPSULECTOMY Bilateral 03/18/2018   Procedure: CAPSULECTOMY;  Surgeon: Peggye Form, DO;  Location: [facility];  Service: Plastics;  Laterality: Bilateral;   DORSAL COMPARTMENT RELEASE Right 07/30/2022   Procedure: RELEASE FIRST  DORSAL COMPARTMENT (DEQUERVAIN);  Surgeon: Marlyne Beards, MD;  Location: [facility];  Service: Orthopedics;  Laterality: Right;  local   LAPAROSCOPIC BILATERAL SALPINGO OOPHERECTOMY Bilateral 01/25/2014   Procedure: LAPAROSCOPIC BILATERAL SALPINGO OOPHORECTOMY;  Surgeon: Hal Morales, MD;  Location: [facility];  Service: Gynecology;  Laterality: Bilateral;   LEEP     PLACEMENT OF BREAST IMPLANTS Bilateral 03/18/2018   Procedure: PLACEMENT OF BREAST IMPLANTS;  Surgeon: Peggye Form, DO;  Location:  SURGERY CENTER;  Service: Plastics;  Laterality: Bilateral;   PORT-A-CATH REMOVAL N/A 01/25/2014   Procedure: REMOVAL PORT-A-CATH;  Surgeon: Ovidio Kin, MD;  Location:  [facility];  Service: General;  Laterality: N/A;   PORTACATH PLACEMENT N/A 06/16/2013   Procedure: POWER PORT PLACEMENT;  Surgeon: Kandis Cocking, MD;  Location: [facility];  Service: General;  Laterality: N/A;   right leg surgery     broken femur - has rod in leg   TONSILLECTOMY     WISDOM TOOTH EXTRACTION       Outpatient Encounter Medications as of 10/07/2022  Medication Sig   anastrozole (ARIMIDEX) 1 MG tablet Take 1 tablet (1 mg total) by mouth daily.   IBRANCE 75 MG tablet TAKE 1 TABLET BY MOUTH DAILY FOR 14 DAYS ON, THEN 14 DAYS OFF. REPEAT EVERY 28 DAYS.   No facility-administered encounter medications on file as of 10/07/2022.     There were no vitals filed for this visit. There is no height or weight on file to calculate BMI.   PHYSICAL EXAM GENERAL:alert, no distress and comfortable SKIN: no rash  EYES: sclera clear NECK: without mass LYMPH:  no palpable cervical or supraclavicular lymphadenopathy  LUNGS: clear with normal breathing effort HEART: regular rate & rhythm, no lower extremity edema ABDOMEN: abdomen soft, non-tender and normal bowel sounds NEURO: alert & oriented x 3 with fluent speech, no focal motor/sensory deficits Breast exam:  PAC without erythema    CBC    Component Value Date/Time   WBC 3.4 (L) 07/07/2022 0715   WBC 9.0 04/23/2020 0723   RBC 3.90 07/07/2022  0715   HGB 11.3 (L) 07/07/2022 0715   HGB 12.3 10/30/2016 0813   HCT 35.1 (L) 07/07/2022 0715   HCT 38.9 10/30/2016 0813   PLT 309 07/07/2022 0715   PLT 217 10/30/2016 0813   MCV 90.0 07/07/2022 0715   MCV 87.2 10/30/2016 0813   MCH 29.0 07/07/2022 0715   MCHC 32.2 07/07/2022 0715   RDW 14.9 07/07/2022 0715   RDW 14.2 10/30/2016 0813   LYMPHSABS 2.0 07/07/2022 0715   LYMPHSABS 2.6 10/30/2016 0813   MONOABS 0.1 07/07/2022 0715   MONOABS 0.3 10/30/2016 0813   EOSABS 0.2 07/07/2022 0715   EOSABS 0.2 10/30/2016 0813   BASOSABS 0.0 07/07/2022 0715   BASOSABS 0.0 10/30/2016 0813     CMP      Component Value Date/Time   NA 141 07/07/2022 0715   NA 141 10/30/2016 0813   K 3.9 07/07/2022 0715   K 4.0 10/30/2016 0813   CL 108 07/07/2022 0715   CO2 27 07/07/2022 0715   CO2 26 10/30/2016 0813   GLUCOSE 104 (H) 07/07/2022 0715   GLUCOSE 105 10/30/2016 0813   BUN 13 07/07/2022 0715   BUN 15.2 10/30/2016 0813   CREATININE 0.95 07/07/2022 0715   CREATININE 0.8 10/30/2016 0813   CALCIUM 9.8 07/07/2022 0715   CALCIUM 10.0 10/30/2016 0813   PROT 7.5 07/07/2022 0715   PROT 7.6 10/30/2016 0813   ALBUMIN 4.3 07/07/2022 0715   ALBUMIN 3.7 10/30/2016 0813   AST 14 (L) 07/07/2022 0715   AST 17 10/30/2016 0813   ALT 9 07/07/2022 0715   ALT 11 10/30/2016 0813   ALKPHOS 50 07/07/2022 0715   ALKPHOS 59 10/30/2016 0813   BILITOT 0.4 07/07/2022 0715   BILITOT 0.26 10/30/2016 0813   GFRNONAA >60 07/07/2022 0715   GFRAA >60 01/30/2020 0908     ASSESSMENT & PLAN: 47 yo female with metastatic breast cancer   1. Stage IV breast cancer, s/p bilateral mastectomies -initially diagnosed in 2011 s/p bilateral mastectomies 01/02/10 for stage pT2, pN0(i+), stage IIA invasive lobular breast cancer, stage 3, ER+/PR+, Her2- -S/p adjuvant chemo CMF and tamoxifen. -Developed left axillary recurrence in 04/2013. S/p left axillary dissection, path showed 10/15 + LNs (TX, N3 = stage IIIC) -S/p carboplatin/docetaxel and left postmastectomy radiation therapy and Tamoxifen 02/2014 - 03/2020  -she developed blurred vision and paresthesias in 03/2020, imaging c/w diffuse bone metastasis confirmed on bone marrow biopsy  -Began Anastrozole, Ibrance, and Xgeva early 2022   PLAN:  No orders of the defined types were placed in this encounter.     All questions were answered. The patient knows to call the clinic with any problems, questions or concerns. No barriers to learning were detected. I spent *** counseling the patient face to face. The total time spent in the appointment was *** and more than 50% was on  counseling, review of test results, and coordination of care.   Santiago Glad, NP-C @DATE @

## 2022-10-07 ENCOUNTER — Encounter: Payer: Self-pay | Admitting: Nurse Practitioner

## 2022-10-07 ENCOUNTER — Inpatient Hospital Stay: Payer: No Typology Code available for payment source | Attending: Oncology | Admitting: Nurse Practitioner

## 2022-10-07 ENCOUNTER — Inpatient Hospital Stay: Payer: No Typology Code available for payment source | Attending: Oncology

## 2022-10-07 ENCOUNTER — Other Ambulatory Visit: Payer: Self-pay

## 2022-10-07 ENCOUNTER — Inpatient Hospital Stay: Payer: No Typology Code available for payment source

## 2022-10-07 VITALS — BP 135/83 | HR 61 | Temp 98.4°F | Resp 18

## 2022-10-07 DIAGNOSIS — C7951 Secondary malignant neoplasm of bone: Secondary | ICD-10-CM | POA: Insufficient documentation

## 2022-10-07 DIAGNOSIS — C50919 Malignant neoplasm of unspecified site of unspecified female breast: Secondary | ICD-10-CM

## 2022-10-07 DIAGNOSIS — C50911 Malignant neoplasm of unspecified site of right female breast: Secondary | ICD-10-CM | POA: Diagnosis present

## 2022-10-07 LAB — CMP (CANCER CENTER ONLY)
ALT: 10 U/L (ref 0–44)
AST: 17 U/L (ref 15–41)
Albumin: 4.5 g/dL (ref 3.5–5.0)
Alkaline Phosphatase: 67 U/L (ref 38–126)
Anion gap: 6 (ref 5–15)
BUN: 11 mg/dL (ref 6–20)
CO2: 27 mmol/L (ref 22–32)
Calcium: 9.8 mg/dL (ref 8.9–10.3)
Chloride: 108 mmol/L (ref 98–111)
Creatinine: 0.89 mg/dL (ref 0.44–1.00)
GFR, Estimated: >60 mL/min (ref >60)
Glucose, Bld: 107 mg/dL — ABNORMAL HIGH (ref 70–99)
Potassium: 4.5 mmol/L (ref 3.5–5.1)
Sodium: 141 mmol/L (ref 135–145)
Total Bilirubin: 0.3 mg/dL (ref 0.3–1.2)
Total Protein: 8.1 g/dL (ref 6.5–8.1)

## 2022-10-07 LAB — CBC WITH DIFFERENTIAL (CANCER CENTER ONLY)
Abs Immature Granulocytes: 0 10*3/uL (ref 0.00–0.07)
Basophils Absolute: 0 10*3/uL (ref 0.0–0.1)
Basophils Relative: 1 %
Eosinophils Absolute: 0.1 10*3/uL (ref 0.0–0.5)
Eosinophils Relative: 4 %
HCT: 34.7 % — ABNORMAL LOW (ref 36.0–46.0)
Hemoglobin: 11.1 g/dL — ABNORMAL LOW (ref 12.0–15.0)
Immature Granulocytes: 0 %
Lymphocytes Relative: 62 %
Lymphs Abs: 1.8 10*3/uL (ref 0.7–4.0)
MCH: 29 pg (ref 26.0–34.0)
MCHC: 32 g/dL (ref 30.0–36.0)
MCV: 90.6 fL (ref 80.0–100.0)
Monocytes Absolute: 0.1 10*3/uL (ref 0.1–1.0)
Monocytes Relative: 5 %
Neutro Abs: 0.8 10*3/uL — ABNORMAL LOW (ref 1.7–7.7)
Neutrophils Relative %: 28 %
Platelet Count: 266 10*3/uL (ref 150–400)
RBC: 3.83 MIL/uL — ABNORMAL LOW (ref 3.87–5.11)
RDW: 14.8 % (ref 11.5–15.5)
WBC Count: 2.8 10*3/uL — ABNORMAL LOW (ref 4.0–10.5)
nRBC: 0 % (ref 0.0–0.2)

## 2022-10-07 MED ORDER — DENOSUMAB 120 MG/1.7ML ~~LOC~~ SOLN
120.0000 mg | Freq: Once | SUBCUTANEOUS | Status: AC
  Administered 2022-10-07: 120 mg via SUBCUTANEOUS
  Filled 2022-10-07: qty 1.7

## 2022-10-07 NOTE — Progress Notes (Signed)
Ms. Goatley refused provider visit with me today. She is being rescheduled with Dr. Pamelia Hoit upon his return. His nurse and scheduler are aware.   Santiago Glad, NP

## 2022-10-07 NOTE — Progress Notes (Deleted)
Patient refused to see provider, Santiago Glad, PA.  Provider notified.

## 2022-10-08 ENCOUNTER — Encounter: Payer: Self-pay | Admitting: Hematology and Oncology

## 2022-10-08 ENCOUNTER — Other Ambulatory Visit: Payer: Self-pay | Admitting: Hematology and Oncology

## 2022-10-08 DIAGNOSIS — C7951 Secondary malignant neoplasm of bone: Secondary | ICD-10-CM

## 2022-10-08 LAB — CANCER ANTIGEN 27.29: CA 27.29: 14.3 U/mL (ref 0.0–38.6)

## 2022-10-20 ENCOUNTER — Ambulatory Visit: Payer: No Typology Code available for payment source | Admitting: Hematology and Oncology

## 2022-10-27 ENCOUNTER — Telehealth: Payer: Self-pay

## 2022-10-27 ENCOUNTER — Ambulatory Visit (HOSPITAL_BASED_OUTPATIENT_CLINIC_OR_DEPARTMENT_OTHER): Payer: No Typology Code available for payment source | Admitting: Hematology and Oncology

## 2022-10-27 DIAGNOSIS — C50911 Malignant neoplasm of unspecified site of right female breast: Secondary | ICD-10-CM

## 2022-10-27 DIAGNOSIS — C771 Secondary and unspecified malignant neoplasm of intrathoracic lymph nodes: Secondary | ICD-10-CM | POA: Diagnosis not present

## 2022-10-27 NOTE — Assessment & Plan Note (Signed)
Metastatic Breast Cancer: Current treatment: Anastrozole with Palbociclib started 05/11/20 Toxicities: Neutropenia: Currently on palbociclib 75 mg dose.   2 weeks on 2 weeks off. Weight gain: I discussed with her about documenting her food intake with the help of an application like my plate.   Scans:  1. 05/08/21: Unchanged appearance of the ST mass in left orbit, diffuse BM uptake 2. 05/16/21: CT CAP: Stable diffuse bone mets, dec in size of liver lesion, no residual measurable celiac axis LN 3. 05/16/21: Bone scan: Stable bone mets 4. 10/05/21: Stable diffuse bone mets, stable Right Axillary and Bilateral Ext iliac LN 5.  10/31/2021: MRI brain: No intracranial mets.  Similar residual infiltrative left orbital soft tissue, known bone metastasis 6.  01/09/2022: MRI breast: Benign, intact silicone implants 7.  04/07/2022: CT CAP: Unchanged bone metastases, unchanged right axillary and external iliac lymph nodes, unchanged metastatic lesion posterior liver  Pain behind Left Eye: Previous MRIs brain have all been neg. Plan to get another MRI brain in Dec 2024 Uses Ibuprofen

## 2022-10-27 NOTE — Progress Notes (Signed)
TELEPHONE VISIT    Patient Care Team: Serena Croissant, MD as PCP - General (Hematology and Oncology) Zenovia Jordan, MD as Consulting Physician (Rheumatology) Dione Booze, Bertram Millard, MD as Consulting Physician (Ophthalmology) Dorothy Puffer, MD as Consulting Physician (Radiation Oncology) Carmela Rima, MD as Consulting Physician (Ophthalmology) Serena Croissant, MD as Consulting Physician (Hematology and Oncology)  DIAGNOSIS:  Encounter Diagnosis  Name Primary?   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 (HCC) Yes    SUMMARY OF ONCOLOGIC HISTORY: Oncology History  Breast cancer metastasized to intrathoracic lymph node (HCC)  01/02/2010 Initial Biopsy   bilateral mastectomies with left axillary lymph node sampling [6 nodes removed] 01/02/2010 for a pT2 pN0(i+), stage IIA invasive lobular breast cancer, grade 3, estrogen and progesterone receptor positive, HER-2 not amplified   2011 Oncotype testing   Oncotype DX score of 22 predicted a 14% risk of distant recurrence within 10 years if the patient's only systemic treatment is tamoxifen for 5 years   01/2010 - 06/2010 Chemotherapy   CMF(cyclophosphamide, fluorouracil, methotrexate) x7 given between October of 2011 and March of 2012 (7 of 8 planned treatments completed)   07/2010 - 10/2011 Anti-estrogen oral therapy   tamoxifen started April 2012, discontinued approximately July of 2013 (about 15 months) because of problems with hot flashes and insomnia   05/11/2013 Initial Diagnosis   pathologically documented left axillary recurrence 05/05/2013, the tumor being again estrogen and progesterone receptor positive, with HER-2 not amplified, and MIB-1 of 15%. Staging CT/PET 05/27/2013 show left axillary and supraclavicular recurrence, no distant disease   06/16/2013 Surgery   completion left axillary lymph node dissection 06/16/2013 showing a total of 10/15 lymph nodes involved by tumor, with evidence of extracapsular extension  (TX N3 = stage IIIC)   07/11/2013 - 09/12/2013 Chemotherapy   Carboplatin/ docetaxel, first dose on 07/11/2013, repeated every 21 days x 4 with Neulasta support on day 2.  Final dose was given on 09/12/2013, with chemotherapy discontinued after 4 cycles due to continuing low counts   10/2013 - 12/09/2013 Radiation Therapy   Left chest wall / 50.4 Gray @ 1.8 Wallace Cullens per fraction x 28 fractions Left Supraclavicular fossa and PAB/ 45 Gray @1 .8 Gray per fraction x 25 fractions Left scar / 10 Gray at [employer] per fraction x 5 fractions       01/25/2014 Surgery   bilateral salpingo-oophorectomy on 01/25/14   12/2013 Miscellaneous   sequencing and deletion/duplication analysis of 17 genes performed September 2015 at [facility] showed no deleterious mutations in  ATM, BARD1, BRCA1, BRCA2, BRIP1, CDH1, CHEK2, MRE11A, MUTYH, NBN, NF1, PALB2, PTEN, RAD50, RAD51C, RAD51D, and TP53   02/27/2014 - 03/2020 Anti-estrogen oral therapy   tamoxifen re-started 02/27/2014, discontinued December 2021 with progression   04/23/2020 Relapse/Recurrence   bone marrow biopsy 04/23/2020 shows metastatic carcinoma, estrogen and progesterone receptor positive             (a) MRI of the cervical spine 04/07/2020 shows markedly abnormal marrow signal, prominent right axillary lymph nodes, degenerative disc disease             (b) MRI of the orbits 04/13/2020 suggests metastatic disease around the left orbit and diffusely abnormal bone marrow signal             (c) CT scan of the chest, abdomen and pelvis 05/10/2020 shows extensive adenopathy (right axillary, prevascular, right hilar, celiac and portacaval axes), multiple liver masses, multiple sclerotic bone lesions   04/2020 - 05/31/2020 Radiation Therapy  radiation to the left periorbital region completed 05/31/2020   05/01/2020 -  Anti-estrogen oral therapy   anastrozole started 05/01/2020 palbociclib started 05/11/2020 denosumab/ Xgeva started 07/05/2020     CHIEF  COMPLIANT: Telephone visit follow-up to discuss the complaint about pain behind the eye  INTERVAL HISTORY: [name] is a 47-year with above-mentioned history metastatic breast cancer was currently under treatment with Ibrance with anastrozole.  She is tolerating the treatment fairly well.  She has had longstanding issues with pain in the head behind the eye.  We had previously performed to MRIs of the brain which were both negative.  She was asking to see if she needs another MRI.  Overall the pain can be managed by with supportive care.   ALLERGIES:  is allergic to sulfa antibiotics, gadolinium derivatives, and petrolatum.  MEDICATIONS:  Current Outpatient Medications  Medication Sig Dispense Refill   anastrozole (ARIMIDEX) 1 MG tablet Take 1 tablet (1 mg total) by mouth daily. 90 tablet 4   IBRANCE 75 MG tablet TAKE 1 TABLET BY MOUTH DAILY FOR 14 DAYS ON, THEN 14 DAYS OFF. REPEAT EVERY 28 DAYS. 14 tablet 1   No current facility-administered medications for this visit.    PHYSICAL EXAMINATION: ECOG PERFORMANCE STATUS: 1 - Symptomatic but completely ambulatory    LABORATORY DATA:  I have reviewed the data as listed    Latest Ref Rng & Units 10/07/2022    8:14 AM 07/07/2022    7:15 AM 04/07/2022    8:04 AM  CMP  Glucose 70 - 99 mg/dL 161  096  045   BUN 6 - 20 mg/dL 11  13  8    Creatinine 0.44 - 1.00 mg/dL 4.09  8.11  9.14   Sodium 135 - 145 mmol/L 141  141  141   Potassium 3.5 - 5.1 mmol/L 4.5  3.9  4.0   Chloride 98 - 111 mmol/L 108  108  109   CO2 22 - 32 mmol/L 27  27  26    Calcium 8.9 - 10.3 mg/dL 9.8  9.8  78.2   Total Protein 6.5 - 8.1 g/dL 8.1  7.5  7.5   Total Bilirubin 0.3 - 1.2 mg/dL 0.3  0.4  0.2   Alkaline Phos 38 - 126 U/L 67  50  65   AST 15 - 41 U/L 17  14  18    ALT 0 - 44 U/L 10  9  13      Lab Results  Component Value Date   WBC 2.8 (L) 10/07/2022   HGB 11.1 (L) 10/07/2022   HCT 34.7 (L) 10/07/2022   MCV 90.6 10/07/2022   PLT 266 10/07/2022    NEUTROABS 0.8 (L) 10/07/2022    ASSESSMENT & PLAN:  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   2 weeks on 2 weeks off. Weight gain: I discussed with her about documenting her food intake with the help of an application like my plate.   Scans:  1. 05/08/21: Unchanged appearance of the ST mass in left orbit, diffuse BM uptake 2. 05/16/21: CT CAP: Stable diffuse bone mets, dec in size of liver lesion, no residual measurable celiac axis LN 3. 05/16/21: Bone scan: Stable bone mets 4. 10/05/21: Stable diffuse bone mets, stable Right Axillary and Bilateral Ext iliac LN 5.  10/31/2021: MRI brain: No intracranial mets.  Similar residual infiltrative left orbital soft  tissue, known bone metastasis 6.  01/09/2022: MRI breast: Benign, intact silicone implants 7.  04/07/2022: CT CAP: Unchanged bone metastases, unchanged right axillary and external iliac lymph nodes, unchanged metastatic lesion posterior liver  Pain behind Left Eye: Previous MRIs brain have all been neg. Plan to get another MRI brain in Dec 2024 Uses Ibuprofen Return to clinic in 3 months with labs and follow-up  Orders Placed This Encounter  Procedures   CBC with Differential (Cancer Center Only)    Standing Status:   Future    Standing Expiration Date:   10/27/2023   CMP (Cancer Center only)    Standing Status:   Future    Standing Expiration Date:   10/27/2023   Cancer antigen 27.29    Standing Status:   Future    Standing Expiration Date:   10/27/2023   The patient has a good understanding of the overall plan. she agrees with it. she will call with any problems that may develop before the next visit here. Total time spent: 30 mins including face to face time and time spent for planning, charting and co-ordination of care   Tamsen Meek, MD 10/27/22

## 2022-10-27 NOTE — Telephone Encounter (Signed)
S/w pt per [employer]. She will have phone visit with MD today at 1000.

## 2022-10-31 ENCOUNTER — Telehealth: Payer: Self-pay | Admitting: Hematology and Oncology

## 2022-10-31 NOTE — Telephone Encounter (Signed)
Called to schedule appointments per 7/1 los. At this time, the patient does not wish to schedule any appointments due to not having her work schedule. Patient stated she will back when she's ready to schedule.

## 2022-12-02 ENCOUNTER — Other Ambulatory Visit (HOSPITAL_COMMUNITY): Payer: Self-pay

## 2022-12-02 ENCOUNTER — Telehealth: Payer: Self-pay

## 2022-12-02 ENCOUNTER — Encounter: Payer: Self-pay | Admitting: Oncology

## 2022-12-02 NOTE — Telephone Encounter (Signed)
Oral Oncology Patient Advocate Encounter  Prior Authorization Renewal for [name] has been approved.    PA# [id number]  Effective dates: 12/02/22 through 12/02/23  Patient may continue to fill at [facility].    Ardeen Fillers, CPhT Oncology Pharmacy Patient Advocate  [facility]  [phone] (phone) [phone] (fax) 12/02/2022 12:04 PM

## 2022-12-02 NOTE — Telephone Encounter (Signed)
Oral Oncology Patient Advocate Encounter  Re-authorization   Received notification that prior authorization for [name] is due for renewal.   PA submitted on 12/02/22  Key [id number]  Status is pending     Ardeen Fillers, CPhT Oncology Pharmacy Patient Advocate  [facility]  [phone] (phone) [phone] (fax) 12/02/2022 10:02 AM

## 2022-12-10 ENCOUNTER — Other Ambulatory Visit: Payer: Self-pay | Admitting: Hematology and Oncology

## 2022-12-10 DIAGNOSIS — C7951 Secondary malignant neoplasm of bone: Secondary | ICD-10-CM

## 2022-12-25 ENCOUNTER — Other Ambulatory Visit: Payer: Self-pay | Admitting: Hematology and Oncology

## 2022-12-25 DIAGNOSIS — C7951 Secondary malignant neoplasm of bone: Secondary | ICD-10-CM

## 2022-12-31 ENCOUNTER — Encounter: Payer: Self-pay | Admitting: Hematology and Oncology

## 2023-09-14 ENCOUNTER — Other Ambulatory Visit: Payer: Self-pay

## 2024-02-01 NOTE — Progress Notes (Signed)
   Please see other note from this date

## 2024-05-10 ENCOUNTER — Telehealth: Payer: Self-pay | Admitting: Radiation Oncology

## 2024-05-10 NOTE — Telephone Encounter (Signed)
 Completed MRR, sent form to dosimetry team 05/10/24.
